# Patient Record
Sex: Female | Born: 1935 | Race: White | Hispanic: No | Smoking: Never smoker
Health system: Southern US, Community
[De-identification: ages and names within clinical notes are randomized; demographics above are authoritative.]

## PROBLEM LIST (undated history)

## (undated) DIAGNOSIS — F329 Major depressive disorder, single episode, unspecified: Secondary | ICD-10-CM

## (undated) DIAGNOSIS — M48061 Spinal stenosis, lumbar region without neurogenic claudication: Secondary | ICD-10-CM

## (undated) DIAGNOSIS — F32A Depression, unspecified: Secondary | ICD-10-CM

## (undated) DIAGNOSIS — R2689 Other abnormalities of gait and mobility: Secondary | ICD-10-CM

## (undated) DIAGNOSIS — M4120 Other idiopathic scoliosis, site unspecified: Secondary | ICD-10-CM

## (undated) DIAGNOSIS — K449 Diaphragmatic hernia without obstruction or gangrene: Secondary | ICD-10-CM

## (undated) DIAGNOSIS — I10 Essential (primary) hypertension: Secondary | ICD-10-CM

## (undated) DIAGNOSIS — I369 Nonrheumatic tricuspid valve disorder, unspecified: Secondary | ICD-10-CM

## (undated) DIAGNOSIS — I2510 Atherosclerotic heart disease of native coronary artery without angina pectoris: Secondary | ICD-10-CM

## (undated) DIAGNOSIS — I493 Ventricular premature depolarization: Secondary | ICD-10-CM

## (undated) DIAGNOSIS — M810 Age-related osteoporosis without current pathological fracture: Secondary | ICD-10-CM

## (undated) DIAGNOSIS — H9190 Unspecified hearing loss, unspecified ear: Secondary | ICD-10-CM

## (undated) DIAGNOSIS — E785 Hyperlipidemia, unspecified: Secondary | ICD-10-CM

## (undated) DIAGNOSIS — D510 Vitamin B12 deficiency anemia due to intrinsic factor deficiency: Secondary | ICD-10-CM

## (undated) DIAGNOSIS — K5790 Diverticulosis of intestine, part unspecified, without perforation or abscess without bleeding: Secondary | ICD-10-CM

## (undated) DIAGNOSIS — M549 Dorsalgia, unspecified: Secondary | ICD-10-CM

## (undated) DIAGNOSIS — M419 Scoliosis, unspecified: Secondary | ICD-10-CM

## (undated) DIAGNOSIS — I350 Nonrheumatic aortic (valve) stenosis: Secondary | ICD-10-CM

## (undated) DIAGNOSIS — I7300 Raynaud's syndrome without gangrene: Secondary | ICD-10-CM

## (undated) DIAGNOSIS — I491 Atrial premature depolarization: Secondary | ICD-10-CM

## (undated) DIAGNOSIS — R739 Hyperglycemia, unspecified: Secondary | ICD-10-CM

## (undated) DIAGNOSIS — I340 Nonrheumatic mitral (valve) insufficiency: Secondary | ICD-10-CM

## (undated) HISTORY — DX: Nonrheumatic tricuspid valve disorder, unspecified: I36.9

## (undated) HISTORY — DX: Spinal stenosis, lumbar region without neurogenic claudication: M48.061

## (undated) HISTORY — DX: Diaphragmatic hernia without obstruction or gangrene: K44.9

## (undated) HISTORY — PX: APPENDECTOMY: SHX54

## (undated) HISTORY — DX: Age-related osteoporosis without current pathological fracture: M81.0

## (undated) HISTORY — DX: Hyperglycemia, unspecified: R73.9

## (undated) HISTORY — PX: TONSILLECTOMY: SHX5217

## (undated) HISTORY — DX: Atherosclerotic heart disease of native coronary artery without angina pectoris: I25.10

## (undated) HISTORY — DX: Other idiopathic scoliosis, site unspecified: M41.20

## (undated) HISTORY — DX: Diverticulosis of intestine, part unspecified, without perforation or abscess without bleeding: K57.90

## (undated) HISTORY — DX: Dorsalgia, unspecified: M54.9

## (undated) HISTORY — PX: FOOT SURGERY: SHX648

## (undated) HISTORY — DX: Nonrheumatic mitral (valve) insufficiency: I34.0

## (undated) HISTORY — DX: Hyperlipidemia, unspecified: E78.5

## (undated) HISTORY — DX: Ventricular premature depolarization: I49.3

## (undated) HISTORY — DX: Nonrheumatic aortic (valve) stenosis: I35.0

## (undated) HISTORY — DX: Unspecified hearing loss, unspecified ear: H91.90

## (undated) HISTORY — DX: Major depressive disorder, single episode, unspecified: F32.9

## (undated) HISTORY — DX: Atrial premature depolarization: I49.1

## (undated) HISTORY — DX: Depression, unspecified: F32.A

## (undated) HISTORY — DX: Raynaud's syndrome without gangrene: I73.00

## (undated) HISTORY — DX: Essential (primary) hypertension: I10

## (undated) HISTORY — DX: Vitamin B12 deficiency anemia due to intrinsic factor deficiency: D51.0

## (undated) HISTORY — PX: BREAST SURGERY: SHX581

## (undated) HISTORY — DX: Scoliosis, unspecified: M41.9

## (undated) HISTORY — DX: Other abnormalities of gait and mobility: R26.89

---

## 2005-11-28 HISTORY — PX: OTHER SURGICAL HISTORY: SHX169

## 2013-06-17 DIAGNOSIS — R5383 Other fatigue: Secondary | ICD-10-CM | POA: Insufficient documentation

## 2013-06-17 DIAGNOSIS — I10 Essential (primary) hypertension: Secondary | ICD-10-CM | POA: Insufficient documentation

## 2014-06-16 DIAGNOSIS — I491 Atrial premature depolarization: Secondary | ICD-10-CM | POA: Insufficient documentation

## 2014-11-28 HISTORY — PX: COLONOSCOPY: SHX174

## 2014-12-02 DIAGNOSIS — E538 Deficiency of other specified B group vitamins: Secondary | ICD-10-CM | POA: Diagnosis not present

## 2014-12-09 DIAGNOSIS — Z23 Encounter for immunization: Secondary | ICD-10-CM | POA: Diagnosis not present

## 2014-12-11 DIAGNOSIS — N200 Calculus of kidney: Secondary | ICD-10-CM | POA: Diagnosis not present

## 2014-12-11 DIAGNOSIS — N3941 Urge incontinence: Secondary | ICD-10-CM | POA: Diagnosis not present

## 2015-01-06 DIAGNOSIS — M47812 Spondylosis without myelopathy or radiculopathy, cervical region: Secondary | ICD-10-CM | POA: Diagnosis not present

## 2015-01-06 DIAGNOSIS — K589 Irritable bowel syndrome without diarrhea: Secondary | ICD-10-CM | POA: Diagnosis not present

## 2015-01-06 DIAGNOSIS — K219 Gastro-esophageal reflux disease without esophagitis: Secondary | ICD-10-CM | POA: Diagnosis not present

## 2015-01-06 DIAGNOSIS — D510 Vitamin B12 deficiency anemia due to intrinsic factor deficiency: Secondary | ICD-10-CM | POA: Diagnosis not present

## 2015-01-06 DIAGNOSIS — R079 Chest pain, unspecified: Secondary | ICD-10-CM | POA: Diagnosis not present

## 2015-01-06 DIAGNOSIS — R51 Headache: Secondary | ICD-10-CM | POA: Diagnosis not present

## 2015-01-06 DIAGNOSIS — M8580 Other specified disorders of bone density and structure, unspecified site: Secondary | ICD-10-CM | POA: Diagnosis not present

## 2015-01-06 DIAGNOSIS — M549 Dorsalgia, unspecified: Secondary | ICD-10-CM | POA: Diagnosis not present

## 2015-01-06 DIAGNOSIS — J209 Acute bronchitis, unspecified: Secondary | ICD-10-CM | POA: Diagnosis not present

## 2015-01-06 DIAGNOSIS — I2510 Atherosclerotic heart disease of native coronary artery without angina pectoris: Secondary | ICD-10-CM | POA: Diagnosis not present

## 2015-01-06 DIAGNOSIS — E780 Pure hypercholesterolemia: Secondary | ICD-10-CM | POA: Diagnosis not present

## 2015-01-06 DIAGNOSIS — F329 Major depressive disorder, single episode, unspecified: Secondary | ICD-10-CM | POA: Diagnosis not present

## 2015-01-06 DIAGNOSIS — Z79899 Other long term (current) drug therapy: Secondary | ICD-10-CM | POA: Diagnosis not present

## 2015-01-06 DIAGNOSIS — M4134 Thoracogenic scoliosis, thoracic region: Secondary | ICD-10-CM | POA: Diagnosis not present

## 2015-01-29 DIAGNOSIS — Z1231 Encounter for screening mammogram for malignant neoplasm of breast: Secondary | ICD-10-CM | POA: Diagnosis not present

## 2015-04-07 DIAGNOSIS — R51 Headache: Secondary | ICD-10-CM | POA: Diagnosis not present

## 2015-04-07 DIAGNOSIS — M549 Dorsalgia, unspecified: Secondary | ICD-10-CM | POA: Diagnosis not present

## 2015-04-07 DIAGNOSIS — E780 Pure hypercholesterolemia: Secondary | ICD-10-CM | POA: Diagnosis not present

## 2015-04-07 DIAGNOSIS — K219 Gastro-esophageal reflux disease without esophagitis: Secondary | ICD-10-CM | POA: Diagnosis not present

## 2015-04-07 DIAGNOSIS — Z79899 Other long term (current) drug therapy: Secondary | ICD-10-CM | POA: Diagnosis not present

## 2015-04-07 DIAGNOSIS — I10 Essential (primary) hypertension: Secondary | ICD-10-CM | POA: Diagnosis not present

## 2015-04-07 DIAGNOSIS — M8580 Other specified disorders of bone density and structure, unspecified site: Secondary | ICD-10-CM | POA: Diagnosis not present

## 2015-04-07 DIAGNOSIS — I2510 Atherosclerotic heart disease of native coronary artery without angina pectoris: Secondary | ICD-10-CM | POA: Diagnosis not present

## 2015-04-07 DIAGNOSIS — K589 Irritable bowel syndrome without diarrhea: Secondary | ICD-10-CM | POA: Diagnosis not present

## 2015-04-07 DIAGNOSIS — F329 Major depressive disorder, single episode, unspecified: Secondary | ICD-10-CM | POA: Diagnosis not present

## 2015-04-07 DIAGNOSIS — R079 Chest pain, unspecified: Secondary | ICD-10-CM | POA: Diagnosis not present

## 2015-04-07 DIAGNOSIS — J209 Acute bronchitis, unspecified: Secondary | ICD-10-CM | POA: Diagnosis not present

## 2015-04-07 DIAGNOSIS — M47812 Spondylosis without myelopathy or radiculopathy, cervical region: Secondary | ICD-10-CM | POA: Diagnosis not present

## 2015-04-07 DIAGNOSIS — M4134 Thoracogenic scoliosis, thoracic region: Secondary | ICD-10-CM | POA: Diagnosis not present

## 2015-04-09 DIAGNOSIS — X32XXXS Exposure to sunlight, sequela: Secondary | ICD-10-CM | POA: Diagnosis not present

## 2015-04-09 DIAGNOSIS — Z1283 Encounter for screening for malignant neoplasm of skin: Secondary | ICD-10-CM | POA: Diagnosis not present

## 2015-04-09 DIAGNOSIS — D225 Melanocytic nevi of trunk: Secondary | ICD-10-CM | POA: Diagnosis not present

## 2015-04-09 DIAGNOSIS — L821 Other seborrheic keratosis: Secondary | ICD-10-CM | POA: Diagnosis not present

## 2015-04-09 DIAGNOSIS — L570 Actinic keratosis: Secondary | ICD-10-CM | POA: Diagnosis not present

## 2015-04-09 DIAGNOSIS — D1801 Hemangioma of skin and subcutaneous tissue: Secondary | ICD-10-CM | POA: Diagnosis not present

## 2015-05-14 DIAGNOSIS — D510 Vitamin B12 deficiency anemia due to intrinsic factor deficiency: Secondary | ICD-10-CM | POA: Diagnosis not present

## 2015-05-29 DIAGNOSIS — H40003 Preglaucoma, unspecified, bilateral: Secondary | ICD-10-CM | POA: Diagnosis not present

## 2015-05-29 DIAGNOSIS — H25813 Combined forms of age-related cataract, bilateral: Secondary | ICD-10-CM | POA: Diagnosis not present

## 2015-06-02 DIAGNOSIS — H169 Unspecified keratitis: Secondary | ICD-10-CM | POA: Diagnosis not present

## 2015-06-08 DIAGNOSIS — I2510 Atherosclerotic heart disease of native coronary artery without angina pectoris: Secondary | ICD-10-CM | POA: Diagnosis not present

## 2015-06-08 DIAGNOSIS — I2583 Coronary atherosclerosis due to lipid rich plaque: Secondary | ICD-10-CM | POA: Diagnosis not present

## 2015-06-10 DIAGNOSIS — N3941 Urge incontinence: Secondary | ICD-10-CM | POA: Diagnosis not present

## 2015-06-10 DIAGNOSIS — I2510 Atherosclerotic heart disease of native coronary artery without angina pectoris: Secondary | ICD-10-CM | POA: Diagnosis not present

## 2015-06-10 DIAGNOSIS — I2583 Coronary atherosclerosis due to lipid rich plaque: Secondary | ICD-10-CM | POA: Diagnosis not present

## 2015-06-16 DIAGNOSIS — H169 Unspecified keratitis: Secondary | ICD-10-CM | POA: Diagnosis not present

## 2015-07-10 DIAGNOSIS — E538 Deficiency of other specified B group vitamins: Secondary | ICD-10-CM | POA: Diagnosis not present

## 2015-08-05 DIAGNOSIS — M549 Dorsalgia, unspecified: Secondary | ICD-10-CM | POA: Diagnosis not present

## 2015-08-05 DIAGNOSIS — E780 Pure hypercholesterolemia: Secondary | ICD-10-CM | POA: Diagnosis not present

## 2015-08-05 DIAGNOSIS — I10 Essential (primary) hypertension: Secondary | ICD-10-CM | POA: Diagnosis not present

## 2015-08-05 DIAGNOSIS — Z79899 Other long term (current) drug therapy: Secondary | ICD-10-CM | POA: Diagnosis not present

## 2015-08-12 DIAGNOSIS — D510 Vitamin B12 deficiency anemia due to intrinsic factor deficiency: Secondary | ICD-10-CM | POA: Diagnosis not present

## 2015-08-12 DIAGNOSIS — Z79899 Other long term (current) drug therapy: Secondary | ICD-10-CM | POA: Diagnosis not present

## 2015-08-12 DIAGNOSIS — Z0000 Encounter for general adult medical examination without abnormal findings: Secondary | ICD-10-CM | POA: Diagnosis not present

## 2015-08-12 DIAGNOSIS — Z124 Encounter for screening for malignant neoplasm of cervix: Secondary | ICD-10-CM | POA: Diagnosis not present

## 2015-08-21 DIAGNOSIS — M549 Dorsalgia, unspecified: Secondary | ICD-10-CM | POA: Diagnosis not present

## 2015-08-21 DIAGNOSIS — N399 Disorder of urinary system, unspecified: Secondary | ICD-10-CM | POA: Diagnosis not present

## 2015-09-02 DIAGNOSIS — N3941 Urge incontinence: Secondary | ICD-10-CM | POA: Diagnosis not present

## 2015-09-02 DIAGNOSIS — I2583 Coronary atherosclerosis due to lipid rich plaque: Secondary | ICD-10-CM | POA: Diagnosis not present

## 2015-09-02 DIAGNOSIS — I2510 Atherosclerotic heart disease of native coronary artery without angina pectoris: Secondary | ICD-10-CM | POA: Diagnosis not present

## 2015-09-15 DIAGNOSIS — N390 Urinary tract infection, site not specified: Secondary | ICD-10-CM | POA: Diagnosis not present

## 2015-09-16 DIAGNOSIS — N390 Urinary tract infection, site not specified: Secondary | ICD-10-CM | POA: Diagnosis not present

## 2015-09-24 DIAGNOSIS — H40003 Preglaucoma, unspecified, bilateral: Secondary | ICD-10-CM | POA: Diagnosis not present

## 2015-09-24 DIAGNOSIS — H169 Unspecified keratitis: Secondary | ICD-10-CM | POA: Diagnosis not present

## 2015-09-24 DIAGNOSIS — H2513 Age-related nuclear cataract, bilateral: Secondary | ICD-10-CM | POA: Diagnosis not present

## 2015-09-24 DIAGNOSIS — H04123 Dry eye syndrome of bilateral lacrimal glands: Secondary | ICD-10-CM | POA: Diagnosis not present

## 2015-09-24 DIAGNOSIS — H527 Unspecified disorder of refraction: Secondary | ICD-10-CM | POA: Diagnosis not present

## 2015-09-28 DIAGNOSIS — H40003 Preglaucoma, unspecified, bilateral: Secondary | ICD-10-CM | POA: Diagnosis not present

## 2015-09-28 DIAGNOSIS — H2513 Age-related nuclear cataract, bilateral: Secondary | ICD-10-CM | POA: Diagnosis not present

## 2015-09-28 DIAGNOSIS — H169 Unspecified keratitis: Secondary | ICD-10-CM | POA: Diagnosis not present

## 2015-10-01 DIAGNOSIS — N390 Urinary tract infection, site not specified: Secondary | ICD-10-CM | POA: Diagnosis not present

## 2015-10-01 DIAGNOSIS — A499 Bacterial infection, unspecified: Secondary | ICD-10-CM | POA: Diagnosis not present

## 2015-10-15 DIAGNOSIS — N390 Urinary tract infection, site not specified: Secondary | ICD-10-CM | POA: Diagnosis not present

## 2015-10-16 DIAGNOSIS — N390 Urinary tract infection, site not specified: Secondary | ICD-10-CM | POA: Diagnosis not present

## 2015-10-20 DIAGNOSIS — D510 Vitamin B12 deficiency anemia due to intrinsic factor deficiency: Secondary | ICD-10-CM | POA: Diagnosis not present

## 2015-11-05 DIAGNOSIS — A499 Bacterial infection, unspecified: Secondary | ICD-10-CM | POA: Diagnosis not present

## 2015-11-05 DIAGNOSIS — N390 Urinary tract infection, site not specified: Secondary | ICD-10-CM | POA: Diagnosis not present

## 2015-11-11 DIAGNOSIS — I2510 Atherosclerotic heart disease of native coronary artery without angina pectoris: Secondary | ICD-10-CM | POA: Diagnosis not present

## 2015-11-11 DIAGNOSIS — I2583 Coronary atherosclerosis due to lipid rich plaque: Secondary | ICD-10-CM | POA: Diagnosis not present

## 2015-11-11 DIAGNOSIS — M5416 Radiculopathy, lumbar region: Secondary | ICD-10-CM | POA: Diagnosis not present

## 2015-11-29 HISTORY — PX: CATARACT EXTRACTION W/ INTRAOCULAR LENS  IMPLANT, BILATERAL: SHX1307

## 2015-12-01 DIAGNOSIS — M5442 Lumbago with sciatica, left side: Secondary | ICD-10-CM | POA: Diagnosis not present

## 2015-12-01 DIAGNOSIS — M5136 Other intervertebral disc degeneration, lumbar region: Secondary | ICD-10-CM | POA: Diagnosis not present

## 2015-12-01 DIAGNOSIS — M4125 Other idiopathic scoliosis, thoracolumbar region: Secondary | ICD-10-CM | POA: Diagnosis not present

## 2015-12-01 DIAGNOSIS — M5432 Sciatica, left side: Secondary | ICD-10-CM | POA: Diagnosis not present

## 2015-12-02 DIAGNOSIS — D510 Vitamin B12 deficiency anemia due to intrinsic factor deficiency: Secondary | ICD-10-CM | POA: Diagnosis not present

## 2015-12-03 DIAGNOSIS — I2510 Atherosclerotic heart disease of native coronary artery without angina pectoris: Secondary | ICD-10-CM | POA: Diagnosis not present

## 2015-12-03 DIAGNOSIS — M5417 Radiculopathy, lumbosacral region: Secondary | ICD-10-CM | POA: Diagnosis not present

## 2015-12-03 DIAGNOSIS — Z885 Allergy status to narcotic agent status: Secondary | ICD-10-CM | POA: Diagnosis not present

## 2015-12-03 DIAGNOSIS — Z955 Presence of coronary angioplasty implant and graft: Secondary | ICD-10-CM | POA: Diagnosis not present

## 2015-12-03 DIAGNOSIS — Z882 Allergy status to sulfonamides status: Secondary | ICD-10-CM | POA: Diagnosis not present

## 2015-12-03 DIAGNOSIS — E785 Hyperlipidemia, unspecified: Secondary | ICD-10-CM | POA: Diagnosis not present

## 2015-12-03 DIAGNOSIS — M1990 Unspecified osteoarthritis, unspecified site: Secondary | ICD-10-CM | POA: Diagnosis not present

## 2015-12-03 DIAGNOSIS — I10 Essential (primary) hypertension: Secondary | ICD-10-CM | POA: Diagnosis not present

## 2015-12-03 DIAGNOSIS — Z888 Allergy status to other drugs, medicaments and biological substances status: Secondary | ICD-10-CM | POA: Diagnosis not present

## 2015-12-04 DIAGNOSIS — Z01818 Encounter for other preprocedural examination: Secondary | ICD-10-CM | POA: Diagnosis not present

## 2015-12-04 DIAGNOSIS — R009 Unspecified abnormalities of heart beat: Secondary | ICD-10-CM | POA: Diagnosis not present

## 2015-12-16 DIAGNOSIS — M549 Dorsalgia, unspecified: Secondary | ICD-10-CM | POA: Diagnosis not present

## 2015-12-16 DIAGNOSIS — M8580 Other specified disorders of bone density and structure, unspecified site: Secondary | ICD-10-CM | POA: Diagnosis not present

## 2015-12-16 DIAGNOSIS — D510 Vitamin B12 deficiency anemia due to intrinsic factor deficiency: Secondary | ICD-10-CM | POA: Diagnosis not present

## 2015-12-16 DIAGNOSIS — E7800 Pure hypercholesterolemia, unspecified: Secondary | ICD-10-CM | POA: Diagnosis not present

## 2015-12-16 DIAGNOSIS — F329 Major depressive disorder, single episode, unspecified: Secondary | ICD-10-CM | POA: Diagnosis not present

## 2015-12-16 DIAGNOSIS — I2510 Atherosclerotic heart disease of native coronary artery without angina pectoris: Secondary | ICD-10-CM | POA: Diagnosis not present

## 2015-12-16 DIAGNOSIS — R51 Headache: Secondary | ICD-10-CM | POA: Diagnosis not present

## 2016-01-06 DIAGNOSIS — R221 Localized swelling, mass and lump, neck: Secondary | ICD-10-CM | POA: Diagnosis not present

## 2016-01-08 DIAGNOSIS — I2583 Coronary atherosclerosis due to lipid rich plaque: Secondary | ICD-10-CM | POA: Diagnosis not present

## 2016-01-08 DIAGNOSIS — I2510 Atherosclerotic heart disease of native coronary artery without angina pectoris: Secondary | ICD-10-CM | POA: Diagnosis not present

## 2016-01-08 DIAGNOSIS — I493 Ventricular premature depolarization: Secondary | ICD-10-CM | POA: Diagnosis not present

## 2016-01-08 DIAGNOSIS — I491 Atrial premature depolarization: Secondary | ICD-10-CM | POA: Diagnosis not present

## 2016-01-08 DIAGNOSIS — R002 Palpitations: Secondary | ICD-10-CM | POA: Diagnosis not present

## 2016-01-11 DIAGNOSIS — D510 Vitamin B12 deficiency anemia due to intrinsic factor deficiency: Secondary | ICD-10-CM | POA: Diagnosis not present

## 2016-01-19 DIAGNOSIS — M25552 Pain in left hip: Secondary | ICD-10-CM | POA: Diagnosis not present

## 2016-01-19 DIAGNOSIS — M7062 Trochanteric bursitis, left hip: Secondary | ICD-10-CM | POA: Diagnosis not present

## 2016-01-26 DIAGNOSIS — L723 Sebaceous cyst: Secondary | ICD-10-CM | POA: Diagnosis not present

## 2016-01-26 DIAGNOSIS — L089 Local infection of the skin and subcutaneous tissue, unspecified: Secondary | ICD-10-CM | POA: Diagnosis not present

## 2016-01-26 DIAGNOSIS — N6089 Other benign mammary dysplasias of unspecified breast: Secondary | ICD-10-CM | POA: Diagnosis not present

## 2016-02-04 DIAGNOSIS — Z1231 Encounter for screening mammogram for malignant neoplasm of breast: Secondary | ICD-10-CM | POA: Diagnosis not present

## 2016-02-23 DIAGNOSIS — D510 Vitamin B12 deficiency anemia due to intrinsic factor deficiency: Secondary | ICD-10-CM | POA: Diagnosis not present

## 2016-02-29 DIAGNOSIS — N3001 Acute cystitis with hematuria: Secondary | ICD-10-CM | POA: Diagnosis not present

## 2016-03-01 DIAGNOSIS — N3001 Acute cystitis with hematuria: Secondary | ICD-10-CM | POA: Diagnosis not present

## 2016-03-08 DIAGNOSIS — R300 Dysuria: Secondary | ICD-10-CM | POA: Diagnosis not present

## 2016-03-08 DIAGNOSIS — R531 Weakness: Secondary | ICD-10-CM | POA: Diagnosis not present

## 2016-04-12 ENCOUNTER — Encounter: Payer: Self-pay | Admitting: Internal Medicine

## 2016-04-12 ENCOUNTER — Non-Acute Institutional Stay: Payer: Medicare Other | Admitting: Internal Medicine

## 2016-04-12 VITALS — BP 120/76 | HR 58 | Temp 97.8°F | Ht 64.5 in | Wt 120.0 lb

## 2016-04-12 DIAGNOSIS — M4806 Spinal stenosis, lumbar region: Secondary | ICD-10-CM

## 2016-04-12 DIAGNOSIS — I340 Nonrheumatic mitral (valve) insufficiency: Secondary | ICD-10-CM | POA: Diagnosis not present

## 2016-04-12 DIAGNOSIS — I2510 Atherosclerotic heart disease of native coronary artery without angina pectoris: Secondary | ICD-10-CM | POA: Diagnosis not present

## 2016-04-12 DIAGNOSIS — I350 Nonrheumatic aortic (valve) stenosis: Secondary | ICD-10-CM

## 2016-04-12 DIAGNOSIS — M4120 Other idiopathic scoliosis, site unspecified: Secondary | ICD-10-CM | POA: Insufficient documentation

## 2016-04-12 DIAGNOSIS — F3340 Major depressive disorder, recurrent, in remission, unspecified: Secondary | ICD-10-CM | POA: Diagnosis not present

## 2016-04-12 DIAGNOSIS — K449 Diaphragmatic hernia without obstruction or gangrene: Secondary | ICD-10-CM

## 2016-04-12 DIAGNOSIS — H9193 Unspecified hearing loss, bilateral: Secondary | ICD-10-CM | POA: Diagnosis not present

## 2016-04-12 DIAGNOSIS — M5441 Lumbago with sciatica, right side: Secondary | ICD-10-CM | POA: Diagnosis not present

## 2016-04-12 DIAGNOSIS — D519 Vitamin B12 deficiency anemia, unspecified: Secondary | ICD-10-CM | POA: Insufficient documentation

## 2016-04-12 DIAGNOSIS — I7300 Raynaud's syndrome without gangrene: Secondary | ICD-10-CM

## 2016-04-12 DIAGNOSIS — H409 Unspecified glaucoma: Secondary | ICD-10-CM

## 2016-04-12 DIAGNOSIS — F329 Major depressive disorder, single episode, unspecified: Secondary | ICD-10-CM

## 2016-04-12 DIAGNOSIS — H9190 Unspecified hearing loss, unspecified ear: Secondary | ICD-10-CM

## 2016-04-12 DIAGNOSIS — E785 Hyperlipidemia, unspecified: Secondary | ICD-10-CM

## 2016-04-12 DIAGNOSIS — R29818 Other symptoms and signs involving the nervous system: Secondary | ICD-10-CM

## 2016-04-12 DIAGNOSIS — R2689 Other abnormalities of gait and mobility: Secondary | ICD-10-CM | POA: Insufficient documentation

## 2016-04-12 DIAGNOSIS — M48061 Spinal stenosis, lumbar region without neurogenic claudication: Secondary | ICD-10-CM | POA: Insufficient documentation

## 2016-04-12 DIAGNOSIS — R002 Palpitations: Secondary | ICD-10-CM | POA: Insufficient documentation

## 2016-04-12 DIAGNOSIS — D510 Vitamin B12 deficiency anemia due to intrinsic factor deficiency: Secondary | ICD-10-CM | POA: Diagnosis not present

## 2016-04-12 DIAGNOSIS — I10 Essential (primary) hypertension: Secondary | ICD-10-CM

## 2016-04-12 DIAGNOSIS — D649 Anemia, unspecified: Secondary | ICD-10-CM | POA: Insufficient documentation

## 2016-04-12 DIAGNOSIS — M549 Dorsalgia, unspecified: Secondary | ICD-10-CM | POA: Insufficient documentation

## 2016-04-12 DIAGNOSIS — M810 Age-related osteoporosis without current pathological fracture: Secondary | ICD-10-CM

## 2016-04-12 HISTORY — DX: Diaphragmatic hernia without obstruction or gangrene: K44.9

## 2016-04-12 HISTORY — DX: Spinal stenosis, lumbar region without neurogenic claudication: M48.061

## 2016-04-12 HISTORY — DX: Hyperlipidemia, unspecified: E78.5

## 2016-04-12 HISTORY — DX: Unspecified hearing loss, unspecified ear: H91.90

## 2016-04-12 HISTORY — DX: Major depressive disorder, single episode, unspecified: F32.9

## 2016-04-12 HISTORY — DX: Raynaud's syndrome without gangrene: I73.00

## 2016-04-12 HISTORY — DX: Nonrheumatic aortic (valve) stenosis: I35.0

## 2016-04-12 HISTORY — DX: Age-related osteoporosis without current pathological fracture: M81.0

## 2016-04-12 HISTORY — DX: Other idiopathic scoliosis, site unspecified: M41.20
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
## 2016-04-20 ENCOUNTER — Other Ambulatory Visit: Payer: Self-pay | Admitting: *Deleted

## 2016-04-20 MED ORDER — ESOMEPRAZOLE MAGNESIUM 40 MG PO CPDR: DELAYED_RELEASE_CAPSULE | ORAL | Status: DC

## 2016-04-20 NOTE — Telephone Encounter (Signed)
Patient requested refill to be faxed to pharmacy.  

## 2016-05-26 ENCOUNTER — Telehealth: Payer: Self-pay | Admitting: *Deleted

## 2016-05-26 DIAGNOSIS — M25552 Pain in left hip: Secondary | ICD-10-CM

## 2016-05-26 NOTE — Telephone Encounter (Signed)
Patient called and stated that she is a new patient at [facility] and just saw you. She stated that she has started having some Left hip pain, stated that this is not new. Before she moved she saw an Orthopedic Provider that gave her shots for this. She stated she would like a referral to an Orthopedic Dr. Here for the Left hip pain. Please Advise.

## 2016-05-26 NOTE — Telephone Encounter (Signed)
Patient notified and agreed. Referral placed. 

## 2016-05-26 NOTE — Telephone Encounter (Signed)
I would recommend Dr. Carter Kitten. If this is acceptable to her, please make a referral.

## 2016-06-03 DIAGNOSIS — M7062 Trochanteric bursitis, left hip: Secondary | ICD-10-CM | POA: Diagnosis not present

## 2016-06-20 DIAGNOSIS — M7062 Trochanteric bursitis, left hip: Secondary | ICD-10-CM | POA: Diagnosis not present

## 2016-06-20 DIAGNOSIS — M545 Low back pain: Secondary | ICD-10-CM | POA: Diagnosis not present

## 2016-06-21 ENCOUNTER — Ambulatory Visit (INDEPENDENT_AMBULATORY_CARE_PROVIDER_SITE_OTHER): Payer: Medicare Other | Admitting: Nurse Practitioner

## 2016-06-21 ENCOUNTER — Encounter: Payer: Self-pay | Admitting: Nurse Practitioner

## 2016-06-21 VITALS — BP 118/68 | HR 54 | Temp 97.9°F | Wt 119.2 lb

## 2016-06-21 DIAGNOSIS — R42 Dizziness and giddiness: Secondary | ICD-10-CM

## 2016-06-21 DIAGNOSIS — I2510 Atherosclerotic heart disease of native coronary artery without angina pectoris: Secondary | ICD-10-CM | POA: Diagnosis not present

## 2016-06-21 DIAGNOSIS — D510 Vitamin B12 deficiency anemia due to intrinsic factor deficiency: Secondary | ICD-10-CM

## 2016-06-21 NOTE — Progress Notes (Signed)
PCP: Jeanmarie Hubert, MD  Advanced Directive information Does patient have an advance directive?: Yes, Type of Advance Directive: [place];Living will, Does patient want to make changes to advanced directive?: No - Patient declined  Allergies  Allergen Reactions  . Sulfa Antibiotics Other (See Comments)    headaches  . Topiramate Rash  . Verapamil Other (See Comments)    constipation  . Zolpidem Other (See Comments)    hallucinations  . Prevacid [Lansoprazole]     Chest pain  . Ace Inhibitors Cough  . Codeine Nausea Only  . Ibandronic Acid Nausea Only    Chief Complaint  Patient presents with  . Referral    Cardiologist  . Fatigue    Patient states shes been feeling weak and dizzy and clammy started this week     HPI: Patient is a 82 y.o. female seen in the office today requesting cardiology referral. Pt moved to [facility] in March. Needing cardiologist due to heart history. Pt with hx of CAD, angina and stent placement in 2008, htn, hyperlipdiema, MR. Previous cardiologist Dr Junius Roads part of [facility] in [place]. Would like referral today.  2 weeks ago after she went to bed had an episode that she felt like could have been chest pains, took nitro which helped symptoms.   Saw Dr Mardelle Matte due to pain in her hip. She has had 2 injections due to bursitis. Still having some pain today.   Had a weak spell and some dizziness yesterday and this morning.  Also got clammy. No chest pains. Lasted a few mins and then it resolved. Felt fine afterwards. Was able to go out to lunch.  No blurred vision, no weakness on one side, no nausea or vomiting.  No loss of balance or loss of consciousness.  Review of Systems:  Review of Systems  Constitutional: Negative for activity change, appetite change, chills, diaphoresis, fatigue, fever and unexpected weight change.  HENT: Positive for hearing loss (bilateral aides). Negative for congestion, ear discharge, ear pain, postnasal  drip, rhinorrhea and sore throat.   Eyes: Negative for pain, redness, itching and visual disturbance.  Respiratory: Negative for cough and shortness of breath.   Cardiovascular: Negative for chest pain, palpitations and leg swelling.       CAD with stent 2008  Gastrointestinal: Negative for abdominal distention, abdominal pain, constipation, diarrhea and nausea.  Endocrine: Negative for cold intolerance, heat intolerance, polydipsia, polyphagia and polyuria.  Genitourinary: Negative for difficulty urinating, dysuria, flank pain and frequency.       UTI a couple of times in the past.  Musculoskeletal: Positive for gait problem (loss of balance). Negative for arthralgias, back pain, myalgias, neck pain and neck stiffness.       Scoliosis. Right sciatica from spinal stenosis. Receives epidural injections.  Skin:       Reports petechia  Allergic/Immunologic: Negative.   Neurological: Positive for dizziness, weakness and light-headedness. Negative for tremors, seizures, syncope, speech difficulty, numbness and headaches.       Patient reports balance issues.  Hematological: Negative for adenopathy. Does not bruise/bleed easily.       Pernicious anemia. Gets monthly B12 injection.  Psychiatric/Behavioral: Negative for agitation, behavioral problems, confusion, dysphoric mood, hallucinations, sleep disturbance and suicidal ideas. The patient is not nervous/anxious and is not hyperactive.        Hx of depression. In remission on Cymbalta.    Past Medical History:  Diagnosis Date  . Anemia 04/12/2016  . Aortic stenosis 04/12/2016  .  APC (atrial premature contractions) 06/16/2014  . Back pain 04/12/2016  . Balance disorder 04/12/2016  . Beat, premature ventricular 01/08/2016  . BP (high blood pressure) 06/17/2013  . Coronary artery disease   . Depression   . Diverticulosis   . Fatigue   . H/O heart artery stent   . Hearing loss 04/12/2016  . Hiatal hernia 04/12/2016  . HLD (hyperlipidemia)  04/12/2016  . Hypertension   . Idiopathic scoliosis 04/12/2016  . Major depression ([place]) 04/12/2016  . Mitral regurgitation   . Osteoporosis, senile 04/12/2016  . Palpitations 04/12/2016  . Pernicious anemia   . Premature atrial contraction   . Raynaud's disease 04/12/2016  . Scoliosis   . Spinal stenosis   . Spinal stenosis of lumbar region 04/12/2016  . Tricuspid valve prolapse    Past Surgical History:  Procedure Laterality Date  . APPENDECTOMY    . broken wrist Right 2007  . COLONOSCOPY  2016  . FOOT SURGERY Right   . TONSILLECTOMY     Social History:   reports that she has never smoked. She has never used smokeless tobacco. She reports that she drinks alcohol. She reports that she does not use drugs.  Family History  Problem Relation Age of Onset  . Heart disease Mother   . Heart disease Father   . HIV Daughter 29    from bone tissue transplant    Medications: Patient's Medications  New Prescriptions   No medications on file  Previous Medications   ACETAMINOPHEN (TYLENOL) 500 MG TABLET    Take 650 mg by mouth. Take 2 tablets daily   ASPIRIN 81 MG CHEWABLE TABLET    Chew 81 mg by mouth. Take one daily   CELECOXIB (CELEBREX) 200 MG CAPSULE    Take by mouth. Take one daily   CHOLECALCIFEROL (VITAMIN D3) 2000 UNITS CAPSULE    Take by mouth. Take one daily   CLONAZEPAM (KLONOPIN) 0.5 MG TABLET    Take by mouth. Take one tablet daily   DULOXETINE (CYMBALTA) 60 MG CAPSULE    Take by mouth. Take one tablet daily   ESOMEPRAZOLE (NEXIUM) 40 MG CAPSULE    Take one tablet by mouth once daily for stomach   IPRATROPIUM (ATROVENT) 0.06 % NASAL SPRAY    Place into the nose. One spray nasal every morning as needed   LACTOBACILLUS-INULIN ([place] PO)    Take by mouth. Take one daily for probiotic   METHYLCELLULOSE, LAXATIVE, 500 MG TABS    Take by mouth. Take three tablets daily   METOPROLOL SUCCINATE (TOPROL-XL) 25 MG 24 HR TABLET    Take by mouth. Take one and a  half tablets (37.5 mg) daily for blood pressure   MULTIPLE VITAMINS-MINERALS PO    Take by mouth. Take 1/2 tablet daily   NITROGLYCERIN (NITROSTAT) 0.4 MG SL TABLET    Place under the tongue. Place one tablet under the tongue every 5 minutes as needed for chest pain. No more than 3   RIBOFLAVIN-MAGNESIUM-FEVERFEW 200-180-50 MG TABS    Take by mouth. Take one tablet every evening   SOLIFENACIN (VESICARE) 5 MG TABLET    Take by mouth. Take one tablet daily for blood pressure   TRAMADOL (ULTRAM) 50 MG TABLET    Take by mouth. Take one every 6 hours as needed for pain  Modified Medications   No medications on file  Discontinued Medications   No medications on file     Physical Exam:  Vitals:  06/21/16 1456  BP: 118/68  Pulse: (!) 54  Temp: 97.9 F (36.6 C)  TempSrc: Oral  SpO2: 97%  Weight: 119 lb 3.2 oz (54.1 kg)   Body mass index is 20.14 kg/m.  Physical Exam  Constitutional: She is oriented to person, place, and time. She appears well-developed and well-nourished. No distress.  HENT:  Nose: Nose normal.  Mouth/Throat: Oropharynx is clear and moist. No oropharyngeal exudate.  Bilateral hearing loss  Eyes: Conjunctivae and EOM are normal. Pupils are equal, round, and reactive to light.  Cardiovascular: Normal rate, regular rhythm and normal heart sounds.   Pulmonary/Chest: Effort normal and breath sounds normal.  Abdominal: Soft. Bowel sounds are normal.  Musculoskeletal: Normal range of motion. She exhibits no edema or tenderness.  Moderately severe scoliosis with greater thoracic curve to the right.  Neurological: She is alert and oriented to person, place, and time.  Poor balance. Unable to perform tandem gait.  Skin: She is not diaphoretic.  Redness discoloration to the tips of the fingers and of the toes and feet. Suspect this is related to her Raynaud's syndrome.  Psychiatric: She has a normal mood and affect.    Labs reviewed: Basic Metabolic Panel: No results  for input(s): NA, K, CL, CO2, GLUCOSE, BUN, CREATININE, CALCIUM, MG, PHOS, TSH in the last 8760 hours. Liver Function Tests: No results for input(s): AST, ALT, ALKPHOS, BILITOT, PROT, ALBUMIN in the last 8760 hours. No results for input(s): LIPASE, AMYLASE in the last 8760 hours. No results for input(s): AMMONIA in the last 8760 hours. CBC: No results for input(s): WBC, NEUTROABS, HGB, HCT, MCV, PLT in the last 8760 hours. Lipid Panel: No results for input(s): CHOL, HDL, LDLCALC, TRIG, CHOLHDL, LDLDIRECT in the last 8760 hours. TSH: No results for input(s): TSH in the last 8760 hours. A1C: No results found for: HGBA1C   Assessment/Plan 1. Arteriosclerosis of coronary artery -hx of CAD, stent placement, MR and htn -new in [place] and would like to establish care with cardiologist for routine follow up - Ambulatory referral to Cardiology  2. Pernicious anemia -routine b12 injections, will follow up CBC - CBC with Differential/Platelet  3. Dizziness Lightheaded with dizziness lasting short amount of time and then resolving. Will get lab work today -encouraged hydration -EKG, sent to cardiology for review, do not have previous EKG for comparison  - CBC with Differential/Platelet - COMPLETE METABOLIC PANEL WITH GFR   Valla Pacey K. Harle Battiest  [facility] [phone] 8 am - 5 pm) [phone] (after hours)

## 2016-06-22 LAB — CBC WITH DIFFERENTIAL/PLATELET
BASOS ABS: 0 {cells}/uL (ref 0–200)
Basophils Relative: 0 %
EOS ABS: 102 {cells}/uL (ref 15–500)
EOS PCT: 1 %
HCT: 37.5 % (ref 35.0–45.0)
HEMOGLOBIN: 12.5 g/dL (ref 11.7–15.5)
LYMPHS ABS: 1428 {cells}/uL (ref 850–3900)
Lymphocytes Relative: 14 %
MCH: 35.2 pg — AB (ref 27.0–33.0)
MCHC: 33.3 g/dL (ref 32.0–36.0)
MCV: 105.6 fL — AB (ref 80.0–100.0)
MONOS PCT: 6 %
MPV: 9.5 fL (ref 7.5–12.5)
Monocytes Absolute: 612 cells/uL (ref 200–950)
NEUTROS PCT: 79 %
Neutro Abs: 8058 cells/uL — ABNORMAL HIGH (ref 1500–7800)
Platelets: 313 10*3/uL (ref 140–400)
RBC: 3.55 MIL/uL — ABNORMAL LOW (ref 3.80–5.10)
RDW: 13.4 % (ref 11.0–15.0)
WBC: 10.2 10*3/uL (ref 3.8–10.8)

## 2016-06-22 LAB — COMPLETE METABOLIC PANEL WITH GFR
ALBUMIN: 4.4 g/dL (ref 3.6–5.1)
ALK PHOS: 111 U/L (ref 33–130)
ALT: 16 U/L (ref 6–29)
AST: 23 U/L (ref 10–35)
BILIRUBIN TOTAL: 0.4 mg/dL (ref 0.2–1.2)
BUN: 26 mg/dL — ABNORMAL HIGH (ref 7–25)
CO2: 25 mmol/L (ref 20–31)
Calcium: 9.7 mg/dL (ref 8.6–10.4)
Chloride: 97 mmol/L — ABNORMAL LOW (ref 98–110)
Creat: 1.16 mg/dL — ABNORMAL HIGH (ref 0.60–0.88)
GFR, EST AFRICAN AMERICAN: 51 mL/min — AB (ref >=60)
GFR, EST NON AFRICAN AMERICAN: 44 mL/min — AB (ref >=60)
GLUCOSE: 143 mg/dL — AB (ref 65–99)
POTASSIUM: 4.7 mmol/L (ref 3.5–5.3)
SODIUM: 133 mmol/L — AB (ref 135–146)
TOTAL PROTEIN: 6.9 g/dL (ref 6.1–8.1)

## 2016-06-24 ENCOUNTER — Encounter: Payer: Self-pay | Admitting: Cardiology

## 2016-06-26 ENCOUNTER — Encounter: Payer: Self-pay | Admitting: Cardiology

## 2016-06-26 NOTE — Progress Notes (Signed)
Cardiology Office Note   Date:  06/27/2016   ID:  Heather Olsen, Heather Olsen [birth year], MRN [id number]  PCP:  Heather Hubert, MD  Cardiologist:   Heather Breeding, MD   Chief Complaint  Patient presents with  . Coronary Artery Disease      History of Present Illness: Heather Olsen is a 82 y.o. female who presents for follow up of CAD. The patient is new to me.  She had stent placement in 2008 and was followed by Heather Olsen.  I was able to get these records. She had an LAD stent placed in thousand and 8 which was a 3.8 mm Vision. She had follow-up creatinine 2009 with 30%  narrowing of the stent but otherwise no disease. Stress perfusion study in 2014 was negative. She does have some tricuspid regurgitation and some mild mitral regurgitation and I don't have the actual echo or echo report. However, these were said to be mild and followed clinically. She is now moved to this area.  She lives at a retirement community. She is a widow. She was taking care of her husband who had blindness and deafness and this caused a lot of stress. However, since he died about a year and half ago she's not had any chest discomfort that she was having.  She is not particularly active because she's having some bursitis but she walks to the dining hall once or twice a day. The patient denies any new symptoms such as chest discomfort, neck or arm discomfort. There has been no new shortness of breath, PND or orthopnea. There have been no reported palpitations, presyncope or syncope.     Past Medical History:  Diagnosis Date  . Aortic stenosis 04/12/2016  . Back pain 04/12/2016  . Balance disorder 04/12/2016  . Beat, premature ventricular 01/08/2016  . Coronary artery disease   . Depression   . Diverticulosis   . Hearing loss 04/12/2016  . Hiatal hernia 04/12/2016  . HLD (hyperlipidemia) 04/12/2016  . Hypertension   . Idiopathic scoliosis 04/12/2016  . Major depression ([place]) 04/12/2016  . Mitral regurgitation   .  Osteoporosis, senile 04/12/2016  . Pernicious anemia   . Premature atrial contraction   . Raynaud's disease 04/12/2016  . Scoliosis   . Spinal stenosis of lumbar region 04/12/2016  . Tricuspid valve prolapse     Past Surgical History:  Procedure Laterality Date  . APPENDECTOMY    . BREAST SURGERY    . broken wrist Right 2007  . COLONOSCOPY  2016  . FOOT SURGERY Right   . TONSILLECTOMY       Current Outpatient Prescriptions  Medication Sig Dispense Refill  . acetaminophen (TYLENOL) 500 MG tablet Take 650 mg by mouth. Take 2 tablets daily    . aspirin 81 MG chewable tablet Chew 81 mg by mouth. Take one daily    . celecoxib (CELEBREX) 200 MG capsule Take by mouth. Take one daily    . Cholecalciferol (VITAMIN D3) 2000 units capsule Take by mouth. Take one daily    . clonazePAM (KLONOPIN) 0.5 MG tablet Take by mouth. Take one tablet daily    . DULoxetine (CYMBALTA) 60 MG capsule Take by mouth. Take one tablet daily    . esomeprazole (NEXIUM) 40 MG capsule Take one tablet by mouth once daily for stomach 30 capsule 3  . ipratropium (ATROVENT) 0.06 % nasal spray Place into the nose. One spray nasal every morning as needed    . Lactobacillus-Inulin ([place])  Take by mouth. Take one daily for probiotic    . metoprolol succinate (TOPROL-XL) 25 MG 24 hr tablet Take by mouth. Take one and a half tablets (37.5 mg) daily for blood pressure    . MULTIPLE VITAMINS-MINERALS PO Take by mouth. Take 1/2 tablet daily    . nitroGLYCERIN (NITROSTAT) 0.4 MG SL tablet Place under the tongue. Place one tablet under the tongue every 5 minutes as needed for chest pain. No more than 3    . Riboflavin-Magnesium-Feverfew 200-180-50 MG TABS Take by mouth. Take one tablet every evening    . solifenacin (VESICARE) 5 MG tablet Take by mouth. Take one tablet daily for blood pressure    . traMADol (ULTRAM) 50 MG tablet Take by mouth. Take one every 6 hours as needed for pain     No current  facility-administered medications for this visit.     Allergies:   Sulfa antibiotics; Topiramate; Verapamil; Zolpidem; Prevacid [lansoprazole]; Ace inhibitors; Codeine; and Ibandronic acid    Social History:  The patient  reports that she has never smoked. She has never used smokeless tobacco. She reports that she drinks alcohol. She reports that she does not use drugs.   Family History:  The patient's family history includes HIV (age of onset: 53) in her daughter; Heart disease in her father and mother.   (No early CAD)   ROS:  Please see the history of present illness.   Otherwise, review of systems are positive for none.   All other systems are reviewed and negative.    PHYSICAL EXAM: VS:  BP (!) 144/80   Pulse 64   Ht 5\' 4"  (1.626 m)   Wt 118 lb (53.5 kg)   BMI 20.25 kg/m  , BMI Body mass index is 20.25 kg/m. GENERAL:  Well appearing, somewhat thin and frail HEENT:  Pupils equal round and reactive, fundi not visualized, oral mucosa unremarkable NECK:  No jugular venous distention, waveform within normal limits, carotid upstroke brisk and symmetric, no bruits, no thyromegaly LYMPHATICS:  No cervical, inguinal adenopathy LUNGS:  Clear to auscultation bilaterally BACK:  No CVA tenderness. Lordosis CHEST:  Unremarkable HEART:  PMI not displaced or sustained,S1 and S2 within normal limits, no S3, no S4, no clicks, no rubs, 2 out of 6 mid left sternal border systolic murmur nonradiating, no diastolic murmurs ABD:  Flat, positive bowel sounds normal in frequency in pitch, no bruits, no rebound, no guarding, no midline pulsatile mass, no hepatomegaly, no splenomegaly EXT:  2 plus pulses throughout, no edema, no cyanosis no clubbing, dependent rubor SKIN:  No rashes no nodules NEURO:  Cranial nerves II through XII grossly intact, motor grossly intact throughout PSYCH:  Cognitively intact, oriented to person place and time    EKG:  EKG is ordered today. The ekg ordered today  demonstrates sinus bradycardia, rate 54, left axis deviation, left anterior fascicular block, RSR prime V1 and V2, poor anterior R wave progression, first-degree AV block, no acute ST-T wave changes.   Recent Labs: 06/21/2016: ALT 16; BUN 26; Creat 1.16; Hemoglobin 12.5; Platelets 313; Potassium 4.7; Sodium 133    Lipid Panel No results found for: CHOL, TRIG, HDL, CHOLHDL, VLDL, LDLCALC, LDLDIRECT    Wt Readings from Last 3 Encounters:  06/27/16 118 lb (53.5 kg)  06/21/16 119 lb 3.2 oz (54.1 kg)  04/12/16 120 lb (54.4 kg)      Other studies Reviewed: Additional studies/ records that were reviewed today include: [facility] records and PCP labs. Review of  the above records demonstrates:  Please see elsewhere in the note.     ASSESSMENT AND PLAN:   CAD:  The patient has no new symptoms. No further cardiovascular testing is suggested. We will continue with risk reduction.  AORTIC STENOSIS:  She has mild stenosis with some mild mitral regurgitation and tricuspid regurgitation. These will be followed clinically. No further therapy or further imaging is indicated.  HTN: Her blood pressures have been well controlled recently. No change in therapy is indicated.  DYSLIPIDEMIA:  She says she controls this with diet though I don't have recent readings. She does not want to take a statin.     Current medicines are reviewed at length with the patient today.  The patient does not have concerns regarding medicines.  The following changes have been made:  no change  Labs/ tests ordered today include:  EKG No orders of the defined types were placed in this encounter.    Disposition:   FU with me in six months.  c    Signed, Heather Breeding, MD  06/27/2016 12:44 PM    [place]

## 2016-06-27 ENCOUNTER — Ambulatory Visit (INDEPENDENT_AMBULATORY_CARE_PROVIDER_SITE_OTHER): Payer: Medicare Other | Admitting: Cardiology

## 2016-06-27 ENCOUNTER — Encounter: Payer: Self-pay | Admitting: Cardiology

## 2016-06-27 VITALS — BP 144/80 | HR 64 | Ht 64.0 in | Wt 118.0 lb

## 2016-06-27 DIAGNOSIS — I071 Rheumatic tricuspid insufficiency: Secondary | ICD-10-CM | POA: Diagnosis not present

## 2016-06-27 DIAGNOSIS — I25119 Atherosclerotic heart disease of native coronary artery with unspecified angina pectoris: Secondary | ICD-10-CM

## 2016-06-27 DIAGNOSIS — I340 Nonrheumatic mitral (valve) insufficiency: Secondary | ICD-10-CM | POA: Diagnosis not present

## 2016-06-27 DIAGNOSIS — I2510 Atherosclerotic heart disease of native coronary artery without angina pectoris: Secondary | ICD-10-CM | POA: Diagnosis not present

## 2016-06-27 NOTE — Patient Instructions (Signed)
Your physician wants you to follow-up in: 6 Months You will receive a reminder letter in the mail two months in advance. If you don't receive a letter, please call our office to schedule the follow-up appointment.  

## 2016-06-28 ENCOUNTER — Telehealth: Payer: Self-pay | Admitting: Cardiology

## 2016-06-28 NOTE — Telephone Encounter (Signed)
Faxed Release signed by patient to [facility] to obtain records per Dr Percival Spanish request.  Faxed 06/28/16. lp

## 2016-06-30 ENCOUNTER — Telehealth: Payer: Self-pay | Admitting: Cardiology

## 2016-06-30 DIAGNOSIS — I259 Chronic ischemic heart disease, unspecified: Secondary | ICD-10-CM | POA: Diagnosis not present

## 2016-06-30 DIAGNOSIS — I340 Nonrheumatic mitral (valve) insufficiency: Secondary | ICD-10-CM | POA: Diagnosis not present

## 2016-06-30 DIAGNOSIS — R2681 Unsteadiness on feet: Secondary | ICD-10-CM | POA: Diagnosis not present

## 2016-06-30 DIAGNOSIS — M545 Low back pain: Secondary | ICD-10-CM | POA: Diagnosis not present

## 2016-06-30 DIAGNOSIS — E7800 Pure hypercholesterolemia, unspecified: Secondary | ICD-10-CM | POA: Diagnosis not present

## 2016-06-30 DIAGNOSIS — M419 Scoliosis, unspecified: Secondary | ICD-10-CM | POA: Diagnosis not present

## 2016-06-30 DIAGNOSIS — F0631 Mood disorder due to known physiological condition with depressive features: Secondary | ICD-10-CM | POA: Diagnosis not present

## 2016-06-30 DIAGNOSIS — M4800 Spinal stenosis, site unspecified: Secondary | ICD-10-CM | POA: Diagnosis not present

## 2016-06-30 DIAGNOSIS — M6281 Muscle weakness (generalized): Secondary | ICD-10-CM | POA: Diagnosis not present

## 2016-06-30 NOTE — Addendum Note (Signed)
Addended by: Cristopher Estimable on: 06/30/2016 11:54 AM   Modules accepted: Orders

## 2016-06-30 NOTE — Telephone Encounter (Signed)
Received records from [facility] as requested by Dr Percival Spanish.  Records given to Dr Percival Spanish for review. lp

## 2016-07-04 DIAGNOSIS — E7800 Pure hypercholesterolemia, unspecified: Secondary | ICD-10-CM | POA: Diagnosis not present

## 2016-07-04 DIAGNOSIS — F0631 Mood disorder due to known physiological condition with depressive features: Secondary | ICD-10-CM | POA: Diagnosis not present

## 2016-07-04 DIAGNOSIS — R2681 Unsteadiness on feet: Secondary | ICD-10-CM | POA: Diagnosis not present

## 2016-07-04 DIAGNOSIS — M6281 Muscle weakness (generalized): Secondary | ICD-10-CM | POA: Diagnosis not present

## 2016-07-04 DIAGNOSIS — M545 Low back pain: Secondary | ICD-10-CM | POA: Diagnosis not present

## 2016-07-04 DIAGNOSIS — I259 Chronic ischemic heart disease, unspecified: Secondary | ICD-10-CM | POA: Diagnosis not present

## 2016-07-06 DIAGNOSIS — E7800 Pure hypercholesterolemia, unspecified: Secondary | ICD-10-CM | POA: Diagnosis not present

## 2016-07-06 DIAGNOSIS — M545 Low back pain: Secondary | ICD-10-CM | POA: Diagnosis not present

## 2016-07-06 DIAGNOSIS — Z0100 Encounter for examination of eyes and vision without abnormal findings: Secondary | ICD-10-CM | POA: Diagnosis not present

## 2016-07-06 DIAGNOSIS — R2681 Unsteadiness on feet: Secondary | ICD-10-CM | POA: Diagnosis not present

## 2016-07-06 DIAGNOSIS — H40032 Anatomical narrow angle, left eye: Secondary | ICD-10-CM | POA: Diagnosis not present

## 2016-07-06 DIAGNOSIS — H25013 Cortical age-related cataract, bilateral: Secondary | ICD-10-CM | POA: Diagnosis not present

## 2016-07-06 DIAGNOSIS — M6281 Muscle weakness (generalized): Secondary | ICD-10-CM | POA: Diagnosis not present

## 2016-07-06 DIAGNOSIS — I259 Chronic ischemic heart disease, unspecified: Secondary | ICD-10-CM | POA: Diagnosis not present

## 2016-07-06 DIAGNOSIS — F0631 Mood disorder due to known physiological condition with depressive features: Secondary | ICD-10-CM | POA: Diagnosis not present

## 2016-07-06 DIAGNOSIS — H40031 Anatomical narrow angle, right eye: Secondary | ICD-10-CM | POA: Diagnosis not present

## 2016-07-08 DIAGNOSIS — M6281 Muscle weakness (generalized): Secondary | ICD-10-CM | POA: Diagnosis not present

## 2016-07-08 DIAGNOSIS — F0631 Mood disorder due to known physiological condition with depressive features: Secondary | ICD-10-CM | POA: Diagnosis not present

## 2016-07-08 DIAGNOSIS — E7800 Pure hypercholesterolemia, unspecified: Secondary | ICD-10-CM | POA: Diagnosis not present

## 2016-07-08 DIAGNOSIS — M545 Low back pain: Secondary | ICD-10-CM | POA: Diagnosis not present

## 2016-07-08 DIAGNOSIS — I259 Chronic ischemic heart disease, unspecified: Secondary | ICD-10-CM | POA: Diagnosis not present

## 2016-07-08 DIAGNOSIS — R2681 Unsteadiness on feet: Secondary | ICD-10-CM | POA: Diagnosis not present

## 2016-07-11 DIAGNOSIS — I259 Chronic ischemic heart disease, unspecified: Secondary | ICD-10-CM | POA: Diagnosis not present

## 2016-07-11 DIAGNOSIS — F0631 Mood disorder due to known physiological condition with depressive features: Secondary | ICD-10-CM | POA: Diagnosis not present

## 2016-07-11 DIAGNOSIS — R2681 Unsteadiness on feet: Secondary | ICD-10-CM | POA: Diagnosis not present

## 2016-07-11 DIAGNOSIS — M6281 Muscle weakness (generalized): Secondary | ICD-10-CM | POA: Diagnosis not present

## 2016-07-11 DIAGNOSIS — E7800 Pure hypercholesterolemia, unspecified: Secondary | ICD-10-CM | POA: Diagnosis not present

## 2016-07-11 DIAGNOSIS — M545 Low back pain: Secondary | ICD-10-CM | POA: Diagnosis not present

## 2016-07-13 DIAGNOSIS — R2681 Unsteadiness on feet: Secondary | ICD-10-CM | POA: Diagnosis not present

## 2016-07-13 DIAGNOSIS — M6281 Muscle weakness (generalized): Secondary | ICD-10-CM | POA: Diagnosis not present

## 2016-07-13 DIAGNOSIS — M545 Low back pain: Secondary | ICD-10-CM | POA: Diagnosis not present

## 2016-07-13 DIAGNOSIS — I259 Chronic ischemic heart disease, unspecified: Secondary | ICD-10-CM | POA: Diagnosis not present

## 2016-07-13 DIAGNOSIS — F0631 Mood disorder due to known physiological condition with depressive features: Secondary | ICD-10-CM | POA: Diagnosis not present

## 2016-07-13 DIAGNOSIS — E7800 Pure hypercholesterolemia, unspecified: Secondary | ICD-10-CM | POA: Diagnosis not present

## 2016-07-14 DIAGNOSIS — R2681 Unsteadiness on feet: Secondary | ICD-10-CM | POA: Diagnosis not present

## 2016-07-14 DIAGNOSIS — M6281 Muscle weakness (generalized): Secondary | ICD-10-CM | POA: Diagnosis not present

## 2016-07-14 DIAGNOSIS — F0631 Mood disorder due to known physiological condition with depressive features: Secondary | ICD-10-CM | POA: Diagnosis not present

## 2016-07-14 DIAGNOSIS — E7800 Pure hypercholesterolemia, unspecified: Secondary | ICD-10-CM | POA: Diagnosis not present

## 2016-07-14 DIAGNOSIS — I259 Chronic ischemic heart disease, unspecified: Secondary | ICD-10-CM | POA: Diagnosis not present

## 2016-07-14 DIAGNOSIS — M545 Low back pain: Secondary | ICD-10-CM | POA: Diagnosis not present

## 2016-07-25 DIAGNOSIS — I259 Chronic ischemic heart disease, unspecified: Secondary | ICD-10-CM | POA: Diagnosis not present

## 2016-07-25 DIAGNOSIS — F0631 Mood disorder due to known physiological condition with depressive features: Secondary | ICD-10-CM | POA: Diagnosis not present

## 2016-07-25 DIAGNOSIS — E7800 Pure hypercholesterolemia, unspecified: Secondary | ICD-10-CM | POA: Diagnosis not present

## 2016-07-25 DIAGNOSIS — M545 Low back pain: Secondary | ICD-10-CM | POA: Diagnosis not present

## 2016-07-25 DIAGNOSIS — R2681 Unsteadiness on feet: Secondary | ICD-10-CM | POA: Diagnosis not present

## 2016-07-25 DIAGNOSIS — M6281 Muscle weakness (generalized): Secondary | ICD-10-CM | POA: Diagnosis not present

## 2016-07-26 DIAGNOSIS — H25812 Combined forms of age-related cataract, left eye: Secondary | ICD-10-CM | POA: Diagnosis not present

## 2016-07-26 DIAGNOSIS — H2512 Age-related nuclear cataract, left eye: Secondary | ICD-10-CM | POA: Diagnosis not present

## 2016-07-26 DIAGNOSIS — H25012 Cortical age-related cataract, left eye: Secondary | ICD-10-CM | POA: Diagnosis not present

## 2016-07-28 DIAGNOSIS — I259 Chronic ischemic heart disease, unspecified: Secondary | ICD-10-CM | POA: Diagnosis not present

## 2016-07-28 DIAGNOSIS — F0631 Mood disorder due to known physiological condition with depressive features: Secondary | ICD-10-CM | POA: Diagnosis not present

## 2016-07-28 DIAGNOSIS — R2681 Unsteadiness on feet: Secondary | ICD-10-CM | POA: Diagnosis not present

## 2016-07-28 DIAGNOSIS — E7800 Pure hypercholesterolemia, unspecified: Secondary | ICD-10-CM | POA: Diagnosis not present

## 2016-07-28 DIAGNOSIS — M545 Low back pain: Secondary | ICD-10-CM | POA: Diagnosis not present

## 2016-07-28 DIAGNOSIS — M6281 Muscle weakness (generalized): Secondary | ICD-10-CM | POA: Diagnosis not present

## 2016-08-01 DIAGNOSIS — M6281 Muscle weakness (generalized): Secondary | ICD-10-CM | POA: Diagnosis not present

## 2016-08-01 DIAGNOSIS — I340 Nonrheumatic mitral (valve) insufficiency: Secondary | ICD-10-CM | POA: Diagnosis not present

## 2016-08-01 DIAGNOSIS — E7800 Pure hypercholesterolemia, unspecified: Secondary | ICD-10-CM | POA: Diagnosis not present

## 2016-08-01 DIAGNOSIS — F0631 Mood disorder due to known physiological condition with depressive features: Secondary | ICD-10-CM | POA: Diagnosis not present

## 2016-08-01 DIAGNOSIS — I259 Chronic ischemic heart disease, unspecified: Secondary | ICD-10-CM | POA: Diagnosis not present

## 2016-08-01 DIAGNOSIS — M4800 Spinal stenosis, site unspecified: Secondary | ICD-10-CM | POA: Diagnosis not present

## 2016-08-01 DIAGNOSIS — R2681 Unsteadiness on feet: Secondary | ICD-10-CM | POA: Diagnosis not present

## 2016-08-01 DIAGNOSIS — M545 Low back pain: Secondary | ICD-10-CM | POA: Diagnosis not present

## 2016-08-01 DIAGNOSIS — M419 Scoliosis, unspecified: Secondary | ICD-10-CM | POA: Diagnosis not present

## 2016-08-03 ENCOUNTER — Other Ambulatory Visit: Payer: Self-pay | Admitting: Internal Medicine

## 2016-08-05 DIAGNOSIS — M6281 Muscle weakness (generalized): Secondary | ICD-10-CM | POA: Diagnosis not present

## 2016-08-05 DIAGNOSIS — M545 Low back pain: Secondary | ICD-10-CM | POA: Diagnosis not present

## 2016-08-05 DIAGNOSIS — R2681 Unsteadiness on feet: Secondary | ICD-10-CM | POA: Diagnosis not present

## 2016-08-05 DIAGNOSIS — I259 Chronic ischemic heart disease, unspecified: Secondary | ICD-10-CM | POA: Diagnosis not present

## 2016-08-05 DIAGNOSIS — E7800 Pure hypercholesterolemia, unspecified: Secondary | ICD-10-CM | POA: Diagnosis not present

## 2016-08-05 DIAGNOSIS — F0631 Mood disorder due to known physiological condition with depressive features: Secondary | ICD-10-CM | POA: Diagnosis not present

## 2016-08-08 DIAGNOSIS — F0631 Mood disorder due to known physiological condition with depressive features: Secondary | ICD-10-CM | POA: Diagnosis not present

## 2016-08-08 DIAGNOSIS — M6281 Muscle weakness (generalized): Secondary | ICD-10-CM | POA: Diagnosis not present

## 2016-08-08 DIAGNOSIS — E7800 Pure hypercholesterolemia, unspecified: Secondary | ICD-10-CM | POA: Diagnosis not present

## 2016-08-08 DIAGNOSIS — M545 Low back pain: Secondary | ICD-10-CM | POA: Diagnosis not present

## 2016-08-08 DIAGNOSIS — R2681 Unsteadiness on feet: Secondary | ICD-10-CM | POA: Diagnosis not present

## 2016-08-08 DIAGNOSIS — I259 Chronic ischemic heart disease, unspecified: Secondary | ICD-10-CM | POA: Diagnosis not present

## 2016-08-10 DIAGNOSIS — F0631 Mood disorder due to known physiological condition with depressive features: Secondary | ICD-10-CM | POA: Diagnosis not present

## 2016-08-10 DIAGNOSIS — R2681 Unsteadiness on feet: Secondary | ICD-10-CM | POA: Diagnosis not present

## 2016-08-10 DIAGNOSIS — M545 Low back pain: Secondary | ICD-10-CM | POA: Diagnosis not present

## 2016-08-10 DIAGNOSIS — I259 Chronic ischemic heart disease, unspecified: Secondary | ICD-10-CM | POA: Diagnosis not present

## 2016-08-10 DIAGNOSIS — M6281 Muscle weakness (generalized): Secondary | ICD-10-CM | POA: Diagnosis not present

## 2016-08-10 DIAGNOSIS — E7800 Pure hypercholesterolemia, unspecified: Secondary | ICD-10-CM | POA: Diagnosis not present

## 2016-08-15 DIAGNOSIS — M7062 Trochanteric bursitis, left hip: Secondary | ICD-10-CM | POA: Diagnosis not present

## 2016-08-15 DIAGNOSIS — M545 Low back pain: Secondary | ICD-10-CM | POA: Diagnosis not present

## 2016-08-16 DIAGNOSIS — H2511 Age-related nuclear cataract, right eye: Secondary | ICD-10-CM | POA: Diagnosis not present

## 2016-08-16 DIAGNOSIS — H25011 Cortical age-related cataract, right eye: Secondary | ICD-10-CM | POA: Diagnosis not present

## 2016-08-16 DIAGNOSIS — H25811 Combined forms of age-related cataract, right eye: Secondary | ICD-10-CM | POA: Diagnosis not present

## 2016-08-17 ENCOUNTER — Other Ambulatory Visit: Payer: Self-pay | Admitting: Internal Medicine

## 2016-08-18 DIAGNOSIS — F0631 Mood disorder due to known physiological condition with depressive features: Secondary | ICD-10-CM | POA: Diagnosis not present

## 2016-08-18 DIAGNOSIS — M545 Low back pain: Secondary | ICD-10-CM | POA: Diagnosis not present

## 2016-08-18 DIAGNOSIS — I259 Chronic ischemic heart disease, unspecified: Secondary | ICD-10-CM | POA: Diagnosis not present

## 2016-08-18 DIAGNOSIS — R2681 Unsteadiness on feet: Secondary | ICD-10-CM | POA: Diagnosis not present

## 2016-08-18 DIAGNOSIS — E7800 Pure hypercholesterolemia, unspecified: Secondary | ICD-10-CM | POA: Diagnosis not present

## 2016-08-18 DIAGNOSIS — M6281 Muscle weakness (generalized): Secondary | ICD-10-CM | POA: Diagnosis not present

## 2016-08-22 ENCOUNTER — Other Ambulatory Visit: Payer: Self-pay | Admitting: Internal Medicine

## 2016-08-22 DIAGNOSIS — D692 Other nonthrombocytopenic purpura: Secondary | ICD-10-CM | POA: Diagnosis not present

## 2016-08-22 DIAGNOSIS — F0631 Mood disorder due to known physiological condition with depressive features: Secondary | ICD-10-CM | POA: Diagnosis not present

## 2016-08-22 DIAGNOSIS — M6281 Muscle weakness (generalized): Secondary | ICD-10-CM | POA: Diagnosis not present

## 2016-08-22 DIAGNOSIS — M545 Low back pain: Secondary | ICD-10-CM | POA: Diagnosis not present

## 2016-08-22 DIAGNOSIS — L814 Other melanin hyperpigmentation: Secondary | ICD-10-CM | POA: Diagnosis not present

## 2016-08-22 DIAGNOSIS — L570 Actinic keratosis: Secondary | ICD-10-CM | POA: Diagnosis not present

## 2016-08-22 DIAGNOSIS — B351 Tinea unguium: Secondary | ICD-10-CM | POA: Diagnosis not present

## 2016-08-22 DIAGNOSIS — I259 Chronic ischemic heart disease, unspecified: Secondary | ICD-10-CM | POA: Diagnosis not present

## 2016-08-22 DIAGNOSIS — R2681 Unsteadiness on feet: Secondary | ICD-10-CM | POA: Diagnosis not present

## 2016-08-22 DIAGNOSIS — E7800 Pure hypercholesterolemia, unspecified: Secondary | ICD-10-CM | POA: Diagnosis not present

## 2016-08-22 DIAGNOSIS — L821 Other seborrheic keratosis: Secondary | ICD-10-CM | POA: Diagnosis not present

## 2016-08-22 DIAGNOSIS — D1801 Hemangioma of skin and subcutaneous tissue: Secondary | ICD-10-CM | POA: Diagnosis not present

## 2016-08-24 DIAGNOSIS — R2681 Unsteadiness on feet: Secondary | ICD-10-CM | POA: Diagnosis not present

## 2016-08-24 DIAGNOSIS — E7800 Pure hypercholesterolemia, unspecified: Secondary | ICD-10-CM | POA: Diagnosis not present

## 2016-08-24 DIAGNOSIS — I259 Chronic ischemic heart disease, unspecified: Secondary | ICD-10-CM | POA: Diagnosis not present

## 2016-08-24 DIAGNOSIS — F0631 Mood disorder due to known physiological condition with depressive features: Secondary | ICD-10-CM | POA: Diagnosis not present

## 2016-08-24 DIAGNOSIS — M6281 Muscle weakness (generalized): Secondary | ICD-10-CM | POA: Diagnosis not present

## 2016-08-24 DIAGNOSIS — M545 Low back pain: Secondary | ICD-10-CM | POA: Diagnosis not present

## 2016-08-29 ENCOUNTER — Other Ambulatory Visit: Payer: Self-pay | Admitting: Internal Medicine

## 2016-08-29 DIAGNOSIS — M4800 Spinal stenosis, site unspecified: Secondary | ICD-10-CM | POA: Diagnosis not present

## 2016-08-29 DIAGNOSIS — E7800 Pure hypercholesterolemia, unspecified: Secondary | ICD-10-CM | POA: Diagnosis not present

## 2016-08-29 DIAGNOSIS — M545 Low back pain: Secondary | ICD-10-CM | POA: Diagnosis not present

## 2016-08-29 DIAGNOSIS — M6281 Muscle weakness (generalized): Secondary | ICD-10-CM | POA: Diagnosis not present

## 2016-08-29 DIAGNOSIS — F0631 Mood disorder due to known physiological condition with depressive features: Secondary | ICD-10-CM | POA: Diagnosis not present

## 2016-08-29 DIAGNOSIS — I259 Chronic ischemic heart disease, unspecified: Secondary | ICD-10-CM | POA: Diagnosis not present

## 2016-08-29 DIAGNOSIS — R2681 Unsteadiness on feet: Secondary | ICD-10-CM | POA: Diagnosis not present

## 2016-08-29 DIAGNOSIS — M419 Scoliosis, unspecified: Secondary | ICD-10-CM | POA: Diagnosis not present

## 2016-08-29 DIAGNOSIS — I340 Nonrheumatic mitral (valve) insufficiency: Secondary | ICD-10-CM | POA: Diagnosis not present

## 2016-09-01 DIAGNOSIS — R2681 Unsteadiness on feet: Secondary | ICD-10-CM | POA: Diagnosis not present

## 2016-09-01 DIAGNOSIS — F0631 Mood disorder due to known physiological condition with depressive features: Secondary | ICD-10-CM | POA: Diagnosis not present

## 2016-09-01 DIAGNOSIS — M6281 Muscle weakness (generalized): Secondary | ICD-10-CM | POA: Diagnosis not present

## 2016-09-01 DIAGNOSIS — E7800 Pure hypercholesterolemia, unspecified: Secondary | ICD-10-CM | POA: Diagnosis not present

## 2016-09-01 DIAGNOSIS — I259 Chronic ischemic heart disease, unspecified: Secondary | ICD-10-CM | POA: Diagnosis not present

## 2016-09-01 DIAGNOSIS — M545 Low back pain: Secondary | ICD-10-CM | POA: Diagnosis not present

## 2016-09-06 DIAGNOSIS — R2681 Unsteadiness on feet: Secondary | ICD-10-CM | POA: Diagnosis not present

## 2016-09-06 DIAGNOSIS — F0631 Mood disorder due to known physiological condition with depressive features: Secondary | ICD-10-CM | POA: Diagnosis not present

## 2016-09-06 DIAGNOSIS — M545 Low back pain: Secondary | ICD-10-CM | POA: Diagnosis not present

## 2016-09-06 DIAGNOSIS — I259 Chronic ischemic heart disease, unspecified: Secondary | ICD-10-CM | POA: Diagnosis not present

## 2016-09-06 DIAGNOSIS — M6281 Muscle weakness (generalized): Secondary | ICD-10-CM | POA: Diagnosis not present

## 2016-09-06 DIAGNOSIS — E7800 Pure hypercholesterolemia, unspecified: Secondary | ICD-10-CM | POA: Diagnosis not present

## 2016-09-07 ENCOUNTER — Other Ambulatory Visit: Payer: Self-pay | Admitting: Internal Medicine

## 2016-09-12 DIAGNOSIS — R2681 Unsteadiness on feet: Secondary | ICD-10-CM | POA: Diagnosis not present

## 2016-09-12 DIAGNOSIS — M6281 Muscle weakness (generalized): Secondary | ICD-10-CM | POA: Diagnosis not present

## 2016-09-12 DIAGNOSIS — E7800 Pure hypercholesterolemia, unspecified: Secondary | ICD-10-CM | POA: Diagnosis not present

## 2016-09-12 DIAGNOSIS — M545 Low back pain: Secondary | ICD-10-CM | POA: Diagnosis not present

## 2016-09-12 DIAGNOSIS — I259 Chronic ischemic heart disease, unspecified: Secondary | ICD-10-CM | POA: Diagnosis not present

## 2016-09-12 DIAGNOSIS — F0631 Mood disorder due to known physiological condition with depressive features: Secondary | ICD-10-CM | POA: Diagnosis not present

## 2016-09-13 ENCOUNTER — Other Ambulatory Visit: Payer: Self-pay | Admitting: Internal Medicine

## 2016-09-14 ENCOUNTER — Other Ambulatory Visit: Payer: Self-pay | Admitting: Internal Medicine

## 2016-09-14 ENCOUNTER — Encounter: Payer: Self-pay | Admitting: Internal Medicine

## 2016-09-14 ENCOUNTER — Ambulatory Visit (INDEPENDENT_AMBULATORY_CARE_PROVIDER_SITE_OTHER): Payer: Medicare Other | Admitting: Internal Medicine

## 2016-09-14 VITALS — BP 130/80 | HR 59 | Temp 97.8°F | Ht 64.0 in | Wt 121.0 lb

## 2016-09-14 DIAGNOSIS — I10 Essential (primary) hypertension: Secondary | ICD-10-CM

## 2016-09-14 DIAGNOSIS — D649 Anemia, unspecified: Secondary | ICD-10-CM | POA: Diagnosis not present

## 2016-09-14 DIAGNOSIS — E785 Hyperlipidemia, unspecified: Secondary | ICD-10-CM

## 2016-09-14 DIAGNOSIS — I2510 Atherosclerotic heart disease of native coronary artery without angina pectoris: Secondary | ICD-10-CM

## 2016-09-14 DIAGNOSIS — R739 Hyperglycemia, unspecified: Secondary | ICD-10-CM | POA: Diagnosis not present

## 2016-09-14 DIAGNOSIS — F3340 Major depressive disorder, recurrent, in remission, unspecified: Secondary | ICD-10-CM

## 2016-09-14 DIAGNOSIS — Z23 Encounter for immunization: Secondary | ICD-10-CM | POA: Diagnosis not present

## 2016-09-14 HISTORY — DX: Hyperglycemia, unspecified: R73.9

## 2016-09-14 NOTE — Progress Notes (Signed)
Facility  [place]    Place of Service:   OFFICE    Allergies  Allergen Reactions  . Sulfa Antibiotics Other (See Comments)    headaches  . Topiramate Rash  . Verapamil Other (See Comments)    constipation  . Zolpidem Other (See Comments)    hallucinations  . Prevacid [Lansoprazole]     Chest pain  . Ace Inhibitors Cough  . Codeine Nausea Only  . Ibandronic Acid Nausea Only    Chief Complaint  Patient presents with  . Medical Management of Chronic Issues    Wants to talk about hyperglycemia  . Immunizations    Wants flu shot    HPI:  She has had at least 3 episodes where she thought she might black out. Had feelings of feeling jittery and weak. Gets better if she eats something. Food helped. Some clammy perspiration.  No nausea. Does not associate the episodes with use of any of her medication.  Essential hypertension - controlled  Encounter for immunization - Plan: Flu Vaccine QUAD 36+ mos IM  Hyperglycemia - glu 113m% 2 months ago. No prior hx of glucose abnormalities.  Hyperlipidemia, unspecified hyperlipidemia type - f/u lab  Anemia, unspecified type - f/u/ lab.  Recurrent major depressive disorder, in remission ([place] - improved    Medications: Patient's Medications  New Prescriptions   No medications on file  Previous Medications   ACETAMINOPHEN (TYLENOL) 500 MG TABLET    Take 650 mg by mouth. Take 2 tablets daily   ASPIRIN 81 MG CHEWABLE TABLET    Chew 81 mg by mouth. Take one daily   CELECOXIB (CELEBREX) 200 MG CAPSULE    TAKE 1 CAPSULE BY MOUTH EVERY DAY   CHOLECALCIFEROL (VITAMIN D3) 2000 UNITS CAPSULE    Take by mouth. Take one daily   CLONAZEPAM (KLONOPIN) 0.5 MG TABLET    TAKE 1 OR 2 TABLETS BY MOUTH EVERY NIGHT AT BEDTIME FOR RESTLESS LEGS   DULOXETINE (CYMBALTA) 60 MG CAPSULE    TAKE 1 CAPSULE BY MOUTH DAILY FOR DEPRESSION   ESOMEPRAZOLE (NEXIUM) 40 MG CAPSULE    TAKE ONE CAPSULE BY MOUTH DAILY FOR STOMACH   IPRATROPIUM (ATROVENT) 0.06 %  NASAL SPRAY    Place into the nose. One spray nasal every morning as needed   LACTOBACILLUS-INULIN ([place])    Take by mouth. Take one daily for probiotic   METOPROLOL SUCCINATE (TOPROL-XL) 25 MG 24 HR TABLET    TAKE 1& 1/2 TABLETS BY MOUTH DAILY   MULTIPLE VITAMINS-MINERALS PO    Take by mouth. Take 1/2 tablet daily   NITROGLYCERIN (NITROSTAT) 0.4 MG SL TABLET    Place under the tongue. Place one tablet under the tongue every 5 minutes as needed for chest pain. No more than 3   RIBOFLAVIN-MAGNESIUM-FEVERFEW 200-180-50 MG TABS    Take by mouth. Take one tablet every evening   TRAMADOL (ULTRAM) 50 MG TABLET    TAKE 1 TABLET BY MOUTH EVERY 4 HOURS   VESICARE 5 MG TABLET    TAKE 1 TABLET BY MOUTH DAILY  Modified Medications   No medications on file  Discontinued Medications   No medications on file    Review of Systems  Constitutional: Negative for activity change, appetite change, chills, diaphoresis, fatigue, fever and unexpected weight change.  HENT: Positive for hearing loss (bilateral aides). Negative for congestion, ear discharge, ear pain, postnasal drip, rhinorrhea, sore throat, tinnitus, trouble swallowing and voice change.   Eyes:  Negative for pain, redness, itching and visual disturbance.  Respiratory: Negative for cough, choking, shortness of breath and wheezing.   Cardiovascular: Negative for chest pain, palpitations and leg swelling.       CAD with stent 2008  Gastrointestinal: Negative for abdominal distention, abdominal pain, constipation, diarrhea and nausea.  Endocrine: Negative for cold intolerance, heat intolerance, polydipsia, polyphagia and polyuria.       Hyperglycemia on nonfasting lab in July 2017.  Genitourinary: Negative for difficulty urinating, dysuria, flank pain, frequency, hematuria, pelvic pain, urgency and vaginal discharge.       UTI a couple of times in the past.  Musculoskeletal: Positive for gait problem (loss of balance). Negative  for arthralgias, back pain, myalgias, neck pain and neck stiffness.       Scoliosis. Right sciatica from spinal stenosis. Receives epidural injections.  Skin: Negative for color change, pallor and rash.       Reports petechia  Allergic/Immunologic: Negative.   Neurological: Negative for dizziness, tremors, seizures, syncope, weakness, numbness and headaches.       Patient reports balance issues.  Hematological: Negative for adenopathy. Does not bruise/bleed easily.       Pernicious anemia. Gets monthly B12 injection.  Psychiatric/Behavioral: Negative for agitation, behavioral problems, confusion, dysphoric mood, hallucinations, sleep disturbance and suicidal ideas. The patient is not nervous/anxious and is not hyperactive.        Hx of depression. In remission on Cymbalta.    Vitals:   09/14/16 1316  BP: 130/80  Pulse: (!) 59  Temp: 97.8 F (36.6 C)  TempSrc: Oral  SpO2: 94%  Weight: 121 lb (54.9 kg)  Height: '5\' 4"'  (1.626 m)   Body mass index is 20.77 kg/m. Wt Readings from Last 3 Encounters:  09/14/16 121 lb (54.9 kg)  06/27/16 118 lb (53.5 kg)  06/21/16 119 lb 3.2 oz (54.1 kg)      Physical Exam  Constitutional: She is oriented to person, place, and time. She appears well-developed and well-nourished. No distress.  HENT:  Nose: Nose normal.  Mouth/Throat: Oropharynx is clear and moist. No oropharyngeal exudate.  Bilateral hearing loss  Eyes: Conjunctivae and EOM are normal. Pupils are equal, round, and reactive to light. No scleral icterus.  Neck: No JVD present. No tracheal deviation present. No thyromegaly present.  Cardiovascular: Normal rate, regular rhythm, normal heart sounds and intact distal pulses.  Exam reveals no gallop and no friction rub.   No murmur heard. Pulmonary/Chest: Effort normal. No respiratory distress. She has no wheezes. She has no rales. She exhibits no tenderness.  Abdominal: She exhibits no distension and no mass. There is no tenderness.    Musculoskeletal: Normal range of motion. She exhibits no edema or tenderness.  Moderately severe scoliosis with greater thoracic curve to the right.  Lymphadenopathy:    She has no cervical adenopathy.  Neurological: She is alert and oriented to person, place, and time. No cranial nerve deficit. Coordination normal.  Poor balance. Unable to perform tandem gait.  Skin: No rash noted. She is not diaphoretic. No erythema. No pallor.  Redness discoloration to the tips of the fingers and of the toes and feet. Suspect this is related to her Raynaud's syndrome.  Psychiatric: She has a normal mood and affect. Her behavior is normal. Judgment and thought content normal.    Labs reviewed: Lab Summary Latest Ref Rng & Units 06/21/2016  Hemoglobin 11.7 - 15.5 g/dL 12.5  Hematocrit 35.0 - 45.0 % 37.5  White count 3.8 - 10.8  K/uL 10.2  Platelet count 140 - 400 K/uL 313  Sodium 135 - 146 mmol/L 133(L)  Potassium 3.5 - 5.3 mmol/L 4.7  Calcium 8.6 - 10.4 mg/dL 9.7  Phosphorus - (None)  Creatinine 0.60 - 0.88 mg/dL 1.16(H)  AST 10 - 35 U/L 23  Alk Phos 33 - 130 U/L 111  Bilirubin 0.2 - 1.2 mg/dL 0.4  Glucose 65 - 99 mg/dL 143(H)  Cholesterol - (None)  HDL cholesterol - (None)  Triglycerides - (None)  LDL Direct - (None)  LDL Calc - (None)  Total protein 6.1 - 8.1 g/dL 6.9  Albumin 3.6 - 5.1 g/dL 4.4   No results found for: TSH, T3TOTAL, T4TOTAL, THYROIDAB Lab Results  Component Value Date   BUN 26 (H) 06/21/2016   No results found for: HGBA1C  Assessment/Plan  1. Essential hypertension controlled  2. Hyperglycemia - Comprehensive metabolic panel - Hemoglobin A1c  3. Hyperlipidemia, unspecified hyperlipidemia type - Lipid panel  4. Anemia, unspecified type - CBC with Differential/Platelet  5. Recurrent major depressive disorder, in remission ([place]) In remission  6. Encounter for immunization - Flu Vaccine QUAD 36+ mos IM

## 2016-09-15 LAB — CBC WITH DIFFERENTIAL/PLATELET
BASOS PCT: 1 %
Basophils Absolute: 61 cells/uL (ref 0–200)
EOS PCT: 4 %
Eosinophils Absolute: 244 cells/uL (ref 15–500)
HCT: 36.9 % (ref 35.0–45.0)
HEMOGLOBIN: 12.3 g/dL (ref 11.7–15.5)
LYMPHS ABS: 1281 {cells}/uL (ref 850–3900)
Lymphocytes Relative: 21 %
MCH: 35.3 pg — ABNORMAL HIGH (ref 27.0–33.0)
MCHC: 33.3 g/dL (ref 32.0–36.0)
MCV: 106 fL — ABNORMAL HIGH (ref 80.0–100.0)
MPV: 10 fL (ref 7.5–12.5)
Monocytes Absolute: 549 cells/uL (ref 200–950)
Monocytes Relative: 9 %
NEUTROS ABS: 3965 {cells}/uL (ref 1500–7800)
NEUTROS PCT: 65 %
Platelets: 388 10*3/uL (ref 140–400)
RBC: 3.48 MIL/uL — AB (ref 3.80–5.10)
RDW: 13.3 % (ref 11.0–15.0)
WBC: 6.1 10*3/uL (ref 3.8–10.8)

## 2016-09-15 LAB — COMPREHENSIVE METABOLIC PANEL
ALBUMIN: 4.2 g/dL (ref 3.6–5.1)
ALT: 18 U/L (ref 6–29)
AST: 26 U/L (ref 10–35)
Alkaline Phosphatase: 119 U/L (ref 33–130)
BILIRUBIN TOTAL: 0.5 mg/dL (ref 0.2–1.2)
BUN: 26 mg/dL — ABNORMAL HIGH (ref 7–25)
CO2: 24 mmol/L (ref 20–31)
CREATININE: 0.91 mg/dL — AB (ref 0.60–0.88)
Calcium: 9.7 mg/dL (ref 8.6–10.4)
Chloride: 96 mmol/L — ABNORMAL LOW (ref 98–110)
Glucose, Bld: 137 mg/dL — ABNORMAL HIGH (ref 65–99)
Potassium: 5.4 mmol/L — ABNORMAL HIGH (ref 3.5–5.3)
SODIUM: 135 mmol/L (ref 135–146)
Total Protein: 6.5 g/dL (ref 6.1–8.1)

## 2016-09-15 LAB — HEMOGLOBIN A1C
Hgb A1c MFr Bld: 6.5 % — ABNORMAL HIGH (ref <5.7)
MEAN PLASMA GLUCOSE: 140 mg/dL

## 2016-09-15 LAB — LIPID PANEL
CHOL/HDL RATIO: 2.3 ratio (ref <=5.0)
Cholesterol: 232 mg/dL — ABNORMAL HIGH (ref 125–200)
HDL: 99 mg/dL (ref >=46)
LDL Cholesterol: 99 mg/dL (ref <130)
Triglycerides: 168 mg/dL — ABNORMAL HIGH (ref <150)
VLDL: 34 mg/dL — AB (ref <30)

## 2016-09-19 ENCOUNTER — Telehealth: Payer: Self-pay | Admitting: *Deleted

## 2016-09-19 DIAGNOSIS — M7062 Trochanteric bursitis, left hip: Secondary | ICD-10-CM | POA: Diagnosis not present

## 2016-09-19 DIAGNOSIS — M545 Low back pain: Secondary | ICD-10-CM | POA: Diagnosis not present

## 2016-09-19 NOTE — Telephone Encounter (Signed)
Patient called requesting her bloodwork results done on 09/14/16. Please Advise.

## 2016-09-20 ENCOUNTER — Other Ambulatory Visit: Payer: Self-pay

## 2016-09-20 DIAGNOSIS — I2510 Atherosclerotic heart disease of native coronary artery without angina pectoris: Secondary | ICD-10-CM

## 2016-09-20 DIAGNOSIS — I10 Essential (primary) hypertension: Secondary | ICD-10-CM

## 2016-09-20 DIAGNOSIS — D510 Vitamin B12 deficiency anemia due to intrinsic factor deficiency: Secondary | ICD-10-CM

## 2016-09-20 DIAGNOSIS — E785 Hyperlipidemia, unspecified: Secondary | ICD-10-CM

## 2016-09-21 ENCOUNTER — Other Ambulatory Visit: Payer: Self-pay

## 2016-09-21 DIAGNOSIS — E785 Hyperlipidemia, unspecified: Secondary | ICD-10-CM

## 2016-09-21 DIAGNOSIS — I10 Essential (primary) hypertension: Secondary | ICD-10-CM

## 2016-09-21 DIAGNOSIS — I2510 Atherosclerotic heart disease of native coronary artery without angina pectoris: Secondary | ICD-10-CM

## 2016-09-21 DIAGNOSIS — D510 Vitamin B12 deficiency anemia due to intrinsic factor deficiency: Secondary | ICD-10-CM

## 2016-09-23 NOTE — Telephone Encounter (Signed)
Chemistry panel is [place]. Glucose was a little high at 137, but 3 months ago it was 143. Hgb A1c was in a good range at 6.5 (we want this to be <7.0). Lipid panel was fine. The LDL, bad cholesterol, was only 99, and the HDL, good cholesterol, was 99, too. High HDL is a good thing. CBC shows the RBC slightly large with a MCV of 106. There is no anemia and the WBC are completely normal.  There is nothing in this lab that suggests an etiology for her feeling that she might black out.

## 2016-09-23 NOTE — Telephone Encounter (Signed)
Patient notified and agreed.  

## 2016-09-26 ENCOUNTER — Other Ambulatory Visit: Payer: Self-pay | Admitting: Internal Medicine

## 2016-09-28 ENCOUNTER — Other Ambulatory Visit: Payer: Self-pay | Admitting: Internal Medicine

## 2016-09-29 ENCOUNTER — Other Ambulatory Visit: Payer: Self-pay | Admitting: Internal Medicine

## 2016-09-29 DIAGNOSIS — M545 Low back pain: Secondary | ICD-10-CM | POA: Diagnosis not present

## 2016-09-30 ENCOUNTER — Other Ambulatory Visit: Payer: Self-pay | Admitting: *Deleted

## 2016-09-30 MED ORDER — IPRATROPIUM BROMIDE 0.06 % NA SOLN: NASAL | 3 refills | Status: AC

## 2016-09-30 NOTE — Telephone Encounter (Signed)
Patient requested to be faxed to pharmacy 

## 2016-10-04 DIAGNOSIS — M545 Low back pain: Secondary | ICD-10-CM | POA: Diagnosis not present

## 2016-10-04 DIAGNOSIS — M7062 Trochanteric bursitis, left hip: Secondary | ICD-10-CM | POA: Diagnosis not present

## 2016-10-04 DIAGNOSIS — M4120 Other idiopathic scoliosis, site unspecified: Secondary | ICD-10-CM | POA: Diagnosis not present

## 2016-10-04 DIAGNOSIS — M47817 Spondylosis without myelopathy or radiculopathy, lumbosacral region: Secondary | ICD-10-CM | POA: Diagnosis not present

## 2016-10-11 ENCOUNTER — Non-Acute Institutional Stay: Payer: Medicare Other | Admitting: Internal Medicine

## 2016-10-11 ENCOUNTER — Encounter: Payer: Self-pay | Admitting: Internal Medicine

## 2016-10-11 VITALS — BP 118/74 | HR 60 | Temp 97.6°F | Ht 64.0 in | Wt 118.0 lb

## 2016-10-11 DIAGNOSIS — I2510 Atherosclerotic heart disease of native coronary artery without angina pectoris: Secondary | ICD-10-CM | POA: Diagnosis not present

## 2016-10-11 DIAGNOSIS — R3915 Urgency of urination: Secondary | ICD-10-CM

## 2016-10-11 DIAGNOSIS — D649 Anemia, unspecified: Secondary | ICD-10-CM | POA: Diagnosis not present

## 2016-10-11 DIAGNOSIS — R739 Hyperglycemia, unspecified: Secondary | ICD-10-CM

## 2016-10-11 NOTE — Progress Notes (Signed)
Facility  [facility]    Place of Service: [facility] (12)     Allergies  Allergen Reactions  . Sulfa Antibiotics Other (See Comments)    headaches  . Topiramate Rash  . Verapamil Other (See Comments)    constipation  . Zolpidem Other (See Comments)    hallucinations  . Prevacid [Lansoprazole]     Chest pain  . Ace Inhibitors Cough  . Codeine Nausea Only  . Ibandronic Acid Nausea Only    Chief Complaint  Patient presents with  . Medical Management of Chronic Issues    one month medication management blood pressure, hyperglycemia, anemia. Patient is decreasing the Vesicare and plans on stopping it.     HPI:  On 09/14/16 , she reported at least 3 episodes when she thought she might black out. Lab following that visit was basically OK. She had glucose 137.  Hyperglycemia - suspect glucose abnormalities may be associated with her episodes that she feels like she may black out.  Anemia, unspecified type - resolved  Urgency of micturition - Vesicare is not helpful. She is tapering off it.    Medications: Patient's Medications  New Prescriptions   No medications on file  Previous Medications   ACETAMINOPHEN (TYLENOL) 500 MG TABLET    Take 650 mg by mouth. Take 2 tablets daily   ASPIRIN 81 MG CHEWABLE TABLET    Chew 81 mg by mouth. Take one daily   CELECOXIB (CELEBREX) 200 MG CAPSULE    TAKE 1 CAPSULE BY MOUTH EVERY DAY   CHOLECALCIFEROL (VITAMIN D3) 2000 UNITS CAPSULE    Take by mouth. Take one daily   CLONAZEPAM (KLONOPIN) 0.5 MG TABLET    TAKE 1 TO 2 TABLETS BY MOUTH EVERY NIGHT AT BEDTIME FOR RESTLESS LEGS   DULOXETINE (CYMBALTA) 60 MG CAPSULE    TAKE 1 CAPSULE BY MOUTH DAILY FOR DEPRESSION   ESOMEPRAZOLE (NEXIUM) 40 MG CAPSULE    TAKE ONE CAPSULE BY MOUTH DAILY FOR STOMACH   IPRATROPIUM (ATROVENT) 0.06 % NASAL SPRAY    Use 2 sprays in each nostril up to three times daily before meals for sinus drainage.   LACTOBACILLUS-INULIN ([place] PO)    Take by  mouth. Take one daily for probiotic   METOPROLOL SUCCINATE (TOPROL-XL) 25 MG 24 HR TABLET    TAKE 1& 1/2 TABLETS BY MOUTH DAILY   MULTIPLE VITAMINS-MINERALS PO    Take by mouth. Take 1/2 tablet daily   NITROGLYCERIN (NITROSTAT) 0.4 MG SL TABLET    Place under the tongue. Place one tablet under the tongue every 5 minutes as needed for chest pain. No more than 3   RIBOFLAVIN-MAGNESIUM-FEVERFEW 200-180-50 MG TABS    Take by mouth. Take one tablet every evening   TRAMADOL (ULTRAM) 50 MG TABLET    TAKE 1 TABLET BY MOUTH EVERY 4 HOURS   VESICARE 5 MG TABLET    TAKE 1 TABLET BY MOUTH DAILY  Modified Medications   No medications on file  Discontinued Medications   No medications on file     Review of Systems  Constitutional: Negative for activity change, appetite change, chills, diaphoresis, fatigue, fever and unexpected weight change.  HENT: Positive for hearing loss (bilateral aides). Negative for congestion, ear discharge, ear pain, postnasal drip, rhinorrhea, sore throat, tinnitus, trouble swallowing and voice change.   Eyes: Negative for pain, redness, itching and visual disturbance.  Respiratory: Negative for cough, choking, shortness of breath and wheezing.   Cardiovascular: Negative for chest  pain, palpitations and leg swelling.       CAD with stent 2008  Gastrointestinal: Negative for abdominal distention, abdominal pain, constipation, diarrhea and nausea.  Endocrine: Negative for cold intolerance, heat intolerance, polydipsia, polyphagia and polyuria.       Hyperglycemia on nonfasting lab in July 2017.  Genitourinary: Positive for urgency. Negative for difficulty urinating, dysuria, flank pain, frequency, hematuria, pelvic pain and vaginal discharge.       UTI a couple of times in the past.  Musculoskeletal: Positive for gait problem (loss of balance). Negative for arthralgias, back pain, myalgias, neck pain and neck stiffness.       Scoliosis. Right sciatica from spinal stenosis.  Receives epidural injections.  Skin: Negative for color change, pallor and rash.       Reports petechia  Allergic/Immunologic: Negative.   Neurological: Negative for dizziness, tremors, seizures, syncope, weakness, numbness and headaches.       Patient reports balance issues.  Hematological: Negative for adenopathy. Does not bruise/bleed easily.       Pernicious anemia. Gets monthly B12 injection.  Psychiatric/Behavioral: Negative for agitation, behavioral problems, confusion, dysphoric mood, hallucinations, sleep disturbance and suicidal ideas. The patient is not nervous/anxious and is not hyperactive.        Hx of depression. In remission on Cymbalta.    Vitals:   10/11/16 1049  BP: 118/74  Pulse: 60  Temp: 97.6 F (36.4 C)  TempSrc: Oral  SpO2: 99%  Weight: 118 lb (53.5 kg)  Height: '5\' 4"'  (1.626 m)   Wt Readings from Last 3 Encounters:  10/11/16 118 lb (53.5 kg)  09/14/16 121 lb (54.9 kg)  06/27/16 118 lb (53.5 kg)    Body mass index is 20.25 kg/m.  Physical Exam  Constitutional: She is oriented to person, place, and time. She appears well-developed and well-nourished. No distress.  HENT:  Nose: Nose normal.  Mouth/Throat: Oropharynx is clear and moist. No oropharyngeal exudate.  Bilateral hearing loss  Eyes: Conjunctivae and EOM are normal. Pupils are equal, round, and reactive to light. No scleral icterus.  Neck: No JVD present. No tracheal deviation present. No thyromegaly present.  Cardiovascular: Normal rate, regular rhythm, normal heart sounds and intact distal pulses.  Exam reveals no gallop and no friction rub.   No murmur heard. Pulmonary/Chest: Effort normal. No respiratory distress. She has no wheezes. She has no rales. She exhibits no tenderness.  Abdominal: She exhibits no distension and no mass. There is no tenderness.  Musculoskeletal: Normal range of motion. She exhibits no edema or tenderness.  Moderately severe scoliosis with greater thoracic curve to  the right.  Lymphadenopathy:    She has no cervical adenopathy.  Neurological: She is alert and oriented to person, place, and time. No cranial nerve deficit. Coordination normal.  Poor balance. Unable to perform tandem gait.  Skin: No rash noted. She is not diaphoretic. No erythema. No pallor.  Redness discoloration to the tips of the fingers and of the toes and feet. Suspect this is related to her Raynaud's syndrome.  Psychiatric: She has a normal mood and affect. Her behavior is normal. Judgment and thought content normal.     Labs reviewed: Lab Summary Latest Ref Rng & Units 09/14/2016 06/21/2016  Hemoglobin 11.7 - 15.5 g/dL 12.3 12.5  Hematocrit 35.0 - 45.0 % 36.9 37.5  White count 3.8 - 10.8 K/uL 6.1 10.2  Platelet count 140 - 400 K/uL 388 313  Sodium 135 - 146 mmol/L 135 133(L)  Potassium 3.5 -  5.3 mmol/L 5.4(H) 4.7  Calcium 8.6 - 10.4 mg/dL 9.7 9.7  Phosphorus - (None) (None)  Creatinine 0.60 - 0.88 mg/dL 0.91(H) 1.16(H)  AST 10 - 35 U/L 26 23  Alk Phos 33 - 130 U/L 119 111  Bilirubin 0.2 - 1.2 mg/dL 0.5 0.4  Glucose 65 - 99 mg/dL 137(H) 143(H)  Cholesterol 125 - 200 mg/dL 232(H) (None)  HDL cholesterol >=46 mg/dL 99 (None)  Triglycerides <150 mg/dL 168(H) (None)  LDL Direct - (None) (None)  LDL Calc <130 mg/dL 99 (None)  Total protein 6.1 - 8.1 g/dL 6.5 6.9  Albumin 3.6 - 5.1 g/dL 4.2 4.4  Some recent data might be hidden   No results found for: TSH Lab Results  Component Value Date   BUN 26 (H) 09/14/2016   BUN 26 (H) 06/21/2016   Lab Results  Component Value Date   CREATININE 0.91 (H) 09/14/2016   CREATININE 1.16 (H) 06/21/2016   Lab Results  Component Value Date   HGBA1C 6.5 (H) 09/14/2016    Assessment/Plan  1. Hyperglycemia She is likely a diabetic. Suspect she may have episodes of hypoglycemia as well. Dietary control only. -BMP, A!C, future  2. Anemia, unspecified type resolved  3. Urgency of micturition DC Vesicare.

## 2016-10-12 ENCOUNTER — Other Ambulatory Visit: Payer: Self-pay | Admitting: Internal Medicine

## 2016-10-14 ENCOUNTER — Telehealth: Payer: Self-pay

## 2016-10-14 NOTE — Telephone Encounter (Signed)
Called patient, left message on voice mail that Dr. Nyoka Cowden said that she could reduce her Metoprolol one daily. If any questions she could call back.

## 2016-10-14 NOTE — Telephone Encounter (Signed)
Patient called c/o fatigue, no energy, and low b/p x 1 week. Patient had to cancel an appointment today due to no energy. Patient would like to know if her Metoprolol can be decreased. Patient is currently taking Metoprolol 25 mg 1 1/2 tablet daily.   Please advise

## 2016-10-14 NOTE — Telephone Encounter (Signed)
Reduce metoprolol to 25 mg daily (1 tablet).

## 2016-10-26 NOTE — Progress Notes (Signed)
Cardiology Office Note   Date:  10/30/2016   ID:  Heather Olsen, Heather Olsen [birth year], MRN [id number]  PCP:  Jeanmarie Hubert, MD  Cardiologist:   Minus Breeding, MD   Chief Complaint  Patient presents with  . Coronary Artery Disease      History of Present Illness: Heather Olsen is a 82 y.o. female who presents for follow up of CAD. She had stent placement in 2008 and was followed by [facility]  She had an LAD stent placed in 2008 which was a 3.8 mm Vision. She had follow-up cath in 2009 with 30%  narrowing of the stent but otherwise no disease. Stress perfusion study in 2014 was negative. She does have some tricuspid regurgitation and some mild mitral regurgitation and I was able to get the report of the [facility] echo since the last appt.   She reports that since that time she has had some episodes of chest pain and her heart rate increasing.  She recalls this happening when she's standing she doesn't know she's had any while she is seated or lying down. She'll feel her heart "revving up". She'll then get some discomfort. Last for a few minutes. The discomfort as a tightness. This comes and goes spontaneously. She might have to sit down and let things improved. This started after she had some sniffling and sneezing and now probable head cold and she felt worn out. She also had her dose of beta blocker reduced because she felt tired. She's not describing any orthostatic symptoms. She's not describing any neck or arm discomfort. She doesn't think this was similar to her previous angina. She has no resting shortness of breath, PND or orthopnea. Although she does have a cough when she lies flat on her left side.  Past Medical History:  Diagnosis Date  . Aortic stenosis 04/12/2016  . Back pain 04/12/2016  . Balance disorder 04/12/2016  . Beat, premature ventricular 01/08/2016  . Coronary artery disease   . Depression   . Diverticulosis   . Hearing loss 04/12/2016  . Hiatal hernia 04/12/2016    . HLD (hyperlipidemia) 04/12/2016  . Hyperglycemia 09/14/2016  . Hypertension   . Idiopathic scoliosis 04/12/2016  . Major depression 04/12/2016  . Mitral regurgitation   . Osteoporosis, senile 04/12/2016  . Pernicious anemia   . Premature atrial contraction   . Raynaud's disease 04/12/2016  . Scoliosis   . Spinal stenosis of lumbar region 04/12/2016  . Tricuspid valve prolapse     Past Surgical History:  Procedure Laterality Date  . APPENDECTOMY    . BREAST SURGERY    . broken wrist Right 2007  . CATARACT EXTRACTION W/ INTRAOCULAR LENS  IMPLANT, BILATERAL Bilateral 2017  . COLONOSCOPY  2016  . FOOT SURGERY Right   . TONSILLECTOMY       Current Outpatient Prescriptions  Medication Sig Dispense Refill  . acetaminophen (TYLENOL) 500 MG tablet Take 650 mg by mouth. Take 2 tablets daily    . aspirin 81 MG chewable tablet Chew 81 mg by mouth. Take one daily    . celecoxib (CELEBREX) 200 MG capsule TAKE 1 CAPSULE BY MOUTH EVERY DAY 30 capsule 0  . Cholecalciferol (VITAMIN D3) 2000 units capsule Take by mouth. Take one daily    . clonazePAM (KLONOPIN) 0.5 MG tablet TAKE 1 TO 2 TABLETS BY MOUTH EVERY NIGHT AT BEDTIME FOR RESTLESS LEGS 60 tablet 0  . DULoxetine (CYMBALTA) 60 MG capsule TAKE 1 CAPSULE BY MOUTH DAILY  FOR DEPRESSION 30 capsule 6  . esomeprazole (NEXIUM) 40 MG capsule TAKE ONE CAPSULE BY MOUTH DAILY FOR STOMACH 30 capsule 2  . ipratropium (ATROVENT) 0.06 % nasal spray Use 2 sprays in each nostril up to three times daily before meals for sinus drainage. 30 mL 3  . Lactobacillus-Inulin ([place] PO) Take by mouth. Take one daily for probiotic    . metoprolol succinate (TOPROL-XL) 25 MG 24 hr tablet TAKE 1& 1/2 TABLETS BY MOUTH DAILY 45 tablet 6  . MULTIPLE VITAMINS-MINERALS PO Take by mouth. Take 1/2 tablet daily    . nitroGLYCERIN (NITROSTAT) 0.4 MG SL tablet Place under the tongue. Place one tablet under the tongue every 5 minutes as needed for chest pain.  No more than 3    . Riboflavin-Magnesium-Feverfew 200-180-50 MG TABS Take by mouth. Take one tablet every evening    . traMADol (ULTRAM) 50 MG tablet TAKE 1 TABLET BY MOUTH EVERY 4 HOURS 75 tablet 0   No current facility-administered medications for this visit.     Allergies:   Sulfa antibiotics; Topiramate; Verapamil; Zolpidem; Prevacid [lansoprazole]; Ace inhibitors; Codeine; and Ibandronic acid    ROS:  Please see the history of present illness.   Otherwise, review of systems are positive for none.   All other systems are reviewed and negative.    PHYSICAL EXAM: VS:  BP 134/80   Pulse 69   Ht 5\' 4"  (1.626 m)   Wt 120 lb (54.4 kg)   BMI 20.60 kg/m  , BMI Body mass index is 20.6 kg/m. GENERAL:  Well appearing, somewhat thin and frail HEENT:  Pupils equal round and reactive, fundi not visualized, oral mucosa unremarkable NECK:  No jugular venous distention, waveform within normal limits, carotid upstroke brisk and symmetric, no bruits, no thyromegaly LYMPHATICS:  No cervical, inguinal adenopathy LUNGS:  Clear to auscultation bilaterally BACK:  No CVA tenderness. Lordosis CHEST:  Unremarkable HEART:  PMI not displaced or sustained,S1 and S2 within normal limits, no S3, no S4, no clicks, no rubs, 2 out of 6 mid left sternal border systolic murmur nonradiating, no diastolic murmurs ABD:  Flat, positive bowel sounds normal in frequency in pitch, no bruits, no rebound, no guarding, no midline pulsatile mass, no hepatomegaly, no splenomegaly EXT:  2 plus pulses throughout, no edema, no cyanosis no clubbing, dependent rubor SKIN:  No rashes no nodules NEURO:  Cranial nerves II through XII grossly intact, motor grossly intact throughout PSYCH:  Cognitively intact, oriented to person place and time   EKG:  EKG is  ordered today. The ekg ordered today demonstrates sinus bradycardia, rate 54, left axis deviation, left anterior fascicular block, RSR prime V1 and V2, poor anterior R wave  progression, first-degree AV block, no acute ST-T wave changes.  PACs   Recent Labs: 09/14/2016: ALT 18; BUN 26; Creat 0.91; Hemoglobin 12.3; Platelets 388; Potassium 5.4; Sodium 135    Lipid Panel    Component Value Date/Time   CHOL 232 (H) 09/14/2016 1415   TRIG 168 (H) 09/14/2016 1415   HDL 99 09/14/2016 1415   CHOLHDL 2.3 09/14/2016 1415   VLDL 34 (H) 09/14/2016 1415   LDLCALC 99 09/14/2016 1415      Wt Readings from Last 3 Encounters:  10/27/16 120 lb (54.4 kg)  10/11/16 118 lb (53.5 kg)  09/14/16 121 lb (54.9 kg)      Other studies Reviewed: Additional studies/ records that were reviewed today include: None Review of the above records demonstrates:  ASSESSMENT AND PLAN:   CAD:  The patient has some chest tightness.  Given this I will order a stress test.  I don't think that she would walk on a treadmill.  She will have a [employer].  PALPITATIONS:   These seem to be improved.  If these continue I will need to place an event monitor.    AORTIC STENOSIS:    She has mild stenosis with some mild mitral regurgitation and tricuspid regurgitation. These will be followed clinically. No further therapy or further imaging is indicated.  HTN: Her blood pressures have been well controlled recently. No change in therapy is indicated.  DYSLIPIDEMIA:  She says she controls this with diet though I don't have recent readings. She does not want to take a statin.     Current medicines are reviewed at length with the patient today.  The patient does not have  concerns regarding medicines.  The following changes have been made:  None  Labs/ tests ordered today include:    Orders Placed This Encounter  Procedures  . Myocardial Perfusion Imaging     Disposition:   FU with me in 3 months.     Signed, Minus Breeding, MD  10/30/2016 10:17 AM    [facility]

## 2016-10-27 ENCOUNTER — Ambulatory Visit (INDEPENDENT_AMBULATORY_CARE_PROVIDER_SITE_OTHER): Payer: Medicare Other | Admitting: Cardiology

## 2016-10-27 ENCOUNTER — Encounter: Payer: Self-pay | Admitting: Cardiology

## 2016-10-27 VITALS — BP 134/80 | HR 69 | Ht 64.0 in | Wt 120.0 lb

## 2016-10-27 DIAGNOSIS — I209 Angina pectoris, unspecified: Secondary | ICD-10-CM

## 2016-10-27 DIAGNOSIS — I25119 Atherosclerotic heart disease of native coronary artery with unspecified angina pectoris: Secondary | ICD-10-CM

## 2016-10-27 DIAGNOSIS — I2510 Atherosclerotic heart disease of native coronary artery without angina pectoris: Secondary | ICD-10-CM

## 2016-10-27 DIAGNOSIS — R079 Chest pain, unspecified: Secondary | ICD-10-CM | POA: Diagnosis not present

## 2016-10-27 NOTE — Patient Instructions (Addendum)
  Medication Instructions:  Continue current medications  Labwork: None Ordered  Testing/Procedures: Your physician has requested that you have a lexiscan myoview. For further information please visit [email or web address]. Please follow instruction sheet, as given.   Follow-Up: Your physician recommends that you schedule a follow-up appointment in: 3 Months   Any Other Special Instructions Will Be Listed Below (If Applicable).     If you need a refill on your cardiac medications before your next appointment, please call your pharmacy.

## 2016-10-30 ENCOUNTER — Encounter: Payer: Self-pay | Admitting: Cardiology

## 2016-11-08 ENCOUNTER — Telehealth (HOSPITAL_COMMUNITY): Payer: Self-pay

## 2016-11-08 NOTE — Telephone Encounter (Signed)
Encounter complete. 

## 2016-11-10 ENCOUNTER — Ambulatory Visit (HOSPITAL_COMMUNITY)
Admission: RE | Admit: 2016-11-10 | Discharge: 2016-11-10 | Disposition: A | Discharge status: Home / Self Care | Payer: Medicare Other | Source: Ambulatory Visit | Attending: Cardiology | Admitting: Cardiology

## 2016-11-10 DIAGNOSIS — R002 Palpitations: Secondary | ICD-10-CM | POA: Insufficient documentation

## 2016-11-10 DIAGNOSIS — I10 Essential (primary) hypertension: Secondary | ICD-10-CM | POA: Insufficient documentation

## 2016-11-10 DIAGNOSIS — I2510 Atherosclerotic heart disease of native coronary artery without angina pectoris: Secondary | ICD-10-CM | POA: Diagnosis not present

## 2016-11-10 DIAGNOSIS — R079 Chest pain, unspecified: Secondary | ICD-10-CM | POA: Diagnosis not present

## 2016-11-10 DIAGNOSIS — R5383 Other fatigue: Secondary | ICD-10-CM | POA: Insufficient documentation

## 2016-11-10 LAB — MYOCARDIAL PERFUSION IMAGING
CHL CUP RESTING HR STRESS: 53 {beats}/min
NUC STRESS TID: 1.08
Peak HR: 75 {beats}/min
SDS: 8
SRS: 3
SSS: 11

## 2016-11-10 MED ORDER — AMINOPHYLLINE 25 MG/ML IV SOLN
75.0000 mg | Freq: Once | INTRAVENOUS | Status: AC
  Administered 2016-11-10: 75 mg via INTRAVENOUS

## 2016-11-10 MED ORDER — TECHNETIUM TC 99M TETROFOSMIN IV KIT
10.5000 | PACK | Freq: Once | INTRAVENOUS | Status: AC | PRN
  Administered 2016-11-10: 10.5 via INTRAVENOUS
  Filled 2016-11-10: qty 11

## 2016-11-10 MED ORDER — TECHNETIUM TC 99M TETROFOSMIN IV KIT
30.6000 | PACK | Freq: Once | INTRAVENOUS | Status: AC | PRN
Start: 2016-11-10 — End: 2016-11-10
  Administered 2016-11-10: 30.6 via INTRAVENOUS
  Filled 2016-11-10: qty 31

## 2016-11-10 MED ORDER — REGADENOSON 0.4 MG/5ML IV SOLN
0.4000 mg | Freq: Once | INTRAVENOUS | Status: AC
  Administered 2016-11-10: 0.4 mg via INTRAVENOUS

## 2016-11-11 ENCOUNTER — Other Ambulatory Visit: Payer: Self-pay | Admitting: Internal Medicine

## 2016-11-13 ENCOUNTER — Other Ambulatory Visit: Payer: Self-pay | Admitting: Nurse Practitioner

## 2016-11-22 ENCOUNTER — Telehealth: Payer: Self-pay | Admitting: Cardiology

## 2016-11-22 NOTE — Telephone Encounter (Signed)
Pt c/o swelling: STAT is pt has developed SOB within 24 hours  1. How long have you been experiencing swelling? week  2. Where is the swelling located? Both legs: under knees, ankles and feet  3.  Are you currently taking a "fluid pill"? no  4.  Are you currently SOB? no  5.  Have you traveled recently?  no

## 2016-11-22 NOTE — Telephone Encounter (Signed)
Returned call to patient- patient reports increased swelling in feet and ankles x 1 week.  Denies SOB, CP, orthopnea, or SOB when lying flat.  Reports she weighed yesterday and her weight was increased by 3-4 lbs but reports she doesn't weigh daily.  Reports swelling decreases overnight and when she sits to elevate her ankles but as the swelling comes back quickly as soon as she is up and moving again.  Reports she use to take HCTZ but currently does not take any diuretics.  Reports ankles and feet are so swollen she had trouble getting her shoes on yesterday.  Denies recent travel.  Reports she may have ate too much salt during the holidays.    Advised to limit salt intake and continue to elevate LE. Advised I would route to MD for further recommendations. Pt verbalized understanding.

## 2016-11-23 MED ORDER — FUROSEMIDE 20 MG PO TABS: 20.0000 mg | ORAL_TABLET | ORAL | 3 refills | Status: DC | PRN

## 2016-11-23 NOTE — Telephone Encounter (Signed)
She can take Lasix 20 mg daily for the next three days then then stop.  Give Lasix 20 mg po prn disp number 30 with 3 refills.

## 2016-11-23 NOTE — Telephone Encounter (Signed)
Called patient and made aware of MD recommendations:  Minus Breeding, MD   10:39 AM  Note    She can take Lasix 20 mg daily for the next three days then then stop.  Give Lasix 20 mg po prn disp number 30 with 3 refills.         Rx sent electronically to pharmacy.  Advised to continue to monitor daily weights and call with further questions/concerns.  Pt agreed and verbalized understanding.

## 2016-11-28 ENCOUNTER — Other Ambulatory Visit: Payer: Self-pay | Admitting: Internal Medicine

## 2016-12-08 DIAGNOSIS — R7303 Prediabetes: Secondary | ICD-10-CM | POA: Diagnosis not present

## 2016-12-08 DIAGNOSIS — N390 Urinary tract infection, site not specified: Secondary | ICD-10-CM | POA: Diagnosis not present

## 2016-12-08 DIAGNOSIS — I7300 Raynaud's syndrome without gangrene: Secondary | ICD-10-CM | POA: Diagnosis not present

## 2016-12-08 DIAGNOSIS — E782 Mixed hyperlipidemia: Secondary | ICD-10-CM | POA: Diagnosis not present

## 2016-12-08 DIAGNOSIS — R600 Localized edema: Secondary | ICD-10-CM | POA: Diagnosis not present

## 2016-12-08 DIAGNOSIS — M859 Disorder of bone density and structure, unspecified: Secondary | ICD-10-CM | POA: Diagnosis not present

## 2016-12-08 DIAGNOSIS — I493 Ventricular premature depolarization: Secondary | ICD-10-CM | POA: Diagnosis not present

## 2016-12-08 DIAGNOSIS — K449 Diaphragmatic hernia without obstruction or gangrene: Secondary | ICD-10-CM | POA: Diagnosis not present

## 2016-12-08 DIAGNOSIS — I10 Essential (primary) hypertension: Secondary | ICD-10-CM | POA: Diagnosis not present

## 2016-12-08 DIAGNOSIS — M8580 Other specified disorders of bone density and structure, unspecified site: Secondary | ICD-10-CM | POA: Diagnosis not present

## 2016-12-16 ENCOUNTER — Other Ambulatory Visit: Payer: Self-pay | Admitting: Internal Medicine

## 2016-12-20 DIAGNOSIS — M810 Age-related osteoporosis without current pathological fracture: Secondary | ICD-10-CM | POA: Diagnosis not present

## 2016-12-20 DIAGNOSIS — E119 Type 2 diabetes mellitus without complications: Secondary | ICD-10-CM | POA: Diagnosis not present

## 2016-12-20 DIAGNOSIS — R600 Localized edema: Secondary | ICD-10-CM | POA: Diagnosis not present

## 2017-01-03 DIAGNOSIS — I7300 Raynaud's syndrome without gangrene: Secondary | ICD-10-CM | POA: Diagnosis not present

## 2017-01-03 DIAGNOSIS — G5603 Carpal tunnel syndrome, bilateral upper limbs: Secondary | ICD-10-CM | POA: Diagnosis not present

## 2017-01-09 DIAGNOSIS — M47812 Spondylosis without myelopathy or radiculopathy, cervical region: Secondary | ICD-10-CM | POA: Diagnosis not present

## 2017-01-09 DIAGNOSIS — M180 Bilateral primary osteoarthritis of first carpometacarpal joints: Secondary | ICD-10-CM | POA: Diagnosis not present

## 2017-01-09 DIAGNOSIS — G5603 Carpal tunnel syndrome, bilateral upper limbs: Secondary | ICD-10-CM | POA: Diagnosis not present

## 2017-01-09 DIAGNOSIS — M79642 Pain in left hand: Secondary | ICD-10-CM | POA: Diagnosis not present

## 2017-01-09 DIAGNOSIS — M79641 Pain in right hand: Secondary | ICD-10-CM | POA: Diagnosis not present

## 2017-01-11 DIAGNOSIS — M180 Bilateral primary osteoarthritis of first carpometacarpal joints: Secondary | ICD-10-CM | POA: Diagnosis not present

## 2017-01-11 DIAGNOSIS — G5603 Carpal tunnel syndrome, bilateral upper limbs: Secondary | ICD-10-CM | POA: Diagnosis not present

## 2017-01-11 DIAGNOSIS — M47812 Spondylosis without myelopathy or radiculopathy, cervical region: Secondary | ICD-10-CM | POA: Diagnosis not present

## 2017-01-11 DIAGNOSIS — M79642 Pain in left hand: Secondary | ICD-10-CM | POA: Diagnosis not present

## 2017-01-11 DIAGNOSIS — M79641 Pain in right hand: Secondary | ICD-10-CM | POA: Diagnosis not present

## 2017-01-19 DIAGNOSIS — I7300 Raynaud's syndrome without gangrene: Secondary | ICD-10-CM | POA: Diagnosis not present

## 2017-01-19 DIAGNOSIS — M2550 Pain in unspecified joint: Secondary | ICD-10-CM | POA: Diagnosis not present

## 2017-01-19 DIAGNOSIS — M79643 Pain in unspecified hand: Secondary | ICD-10-CM | POA: Diagnosis not present

## 2017-01-19 DIAGNOSIS — R202 Paresthesia of skin: Secondary | ICD-10-CM | POA: Diagnosis not present

## 2017-01-20 DIAGNOSIS — R600 Localized edema: Secondary | ICD-10-CM | POA: Diagnosis not present

## 2017-01-25 ENCOUNTER — Other Ambulatory Visit: Payer: Self-pay | Admitting: Nurse Practitioner

## 2017-01-29 NOTE — Progress Notes (Signed)
Cardiology Office Note   Date:  01/31/2017   ID:  Heather Olsen, Heather Olsen 1931-02-06, MRN [id number]  PCP:  Horatio Pel, MD  Cardiologist:   Minus Breeding, MD   Chief Complaint  Patient presents with  . Leg Swelling      History of Present Illness: Heather Olsen is a 83 y.o. female who presents for follow up of CAD. She had stent placement in 2008 and was followed by [facility].  She had an LAD stent placed in 2008 which was a 3.8 mm Vision. She had follow-up cath in 2009 with 30%  narrowing of the stent but otherwise no disease. Stress perfusion study in 2014 was negative. She does have some tricuspid regurgitation and some mild mitral regurgitation and I was able to get the report of the [facility] echo.  At the last visit she had some chest pain but follow up Lexiscan Myoview was negative.  She called after that appt with increased leg swelling thought to be related to salt increase but she did not need to take Lasix.  Since then she has done very well.  The patient denies any new symptoms such as chest discomfort, neck or arm discomfort. There has been no new shortness of breath, PND or orthopnea. There have been no reported palpitations, presyncope or syncope.  She thins that she has more energy.    Past Medical History:  Diagnosis Date  . Aortic stenosis 04/12/2016  . Back pain 04/12/2016  . Balance disorder 04/12/2016  . Beat, premature ventricular 01/08/2016  . Coronary artery disease   . Depression   . Diverticulosis   . Hearing loss 04/12/2016  . Hiatal hernia 04/12/2016  . HLD (hyperlipidemia) 04/12/2016  . Hyperglycemia 09/14/2016  . Hypertension   . Idiopathic scoliosis 04/12/2016  . Major depression 04/12/2016  . Mitral regurgitation   . Osteoporosis, senile 04/12/2016  . Pernicious anemia   . Premature atrial contraction   . Raynaud's disease 04/12/2016  . Scoliosis   . Spinal stenosis of lumbar region 04/12/2016  . Tricuspid valve prolapse     Past  Surgical History:  Procedure Laterality Date  . APPENDECTOMY    . BREAST SURGERY    . broken wrist Right 2007  . CATARACT EXTRACTION W/ INTRAOCULAR LENS  IMPLANT, BILATERAL Bilateral 2017  . COLONOSCOPY  2016  . FOOT SURGERY Right   . TONSILLECTOMY       Current Outpatient Prescriptions  Medication Sig Dispense Refill  . acetaminophen (TYLENOL) 500 MG tablet Take 650 mg by mouth. Take 2 tablets daily    . aspirin 81 MG chewable tablet Chew 81 mg by mouth. Take one daily    . celecoxib (CELEBREX) 200 MG capsule TAKE 1 CAPSULE BY MOUTH EVERY DAY 30 capsule 0  . Cholecalciferol (VITAMIN D3) 2000 units capsule Take by mouth. Take one daily    . clonazePAM (KLONOPIN) 0.5 MG tablet Pt take 3/4 mg by mouth every night at bedtime for restless leg    . diclofenac sodium (VOLTAREN) 1 % GEL Apply 1 application topically daily.    . DULoxetine (CYMBALTA) 60 MG capsule Take 1 capsule by mouth daily.    [name] esomeprazole (NEXIUM) 40 MG capsule TAKE ONE CAPSULE BY MOUTH DAILY FOR STOMACH 30 capsule 2  . furosemide (LASIX) 20 MG tablet Take 1 tablet (20 mg total) by mouth as needed. 30 tablet 3  . hydrochlorothiazide (HYDRODIURIL) 12.5 MG tablet Take 1 tablet by mouth as needed.    [name]  ipratropium (ATROVENT) 0.06 % nasal spray Use 2 sprays in each nostril up to three times daily before meals for sinus drainage. 30 mL 3  . Lactobacillus-Inulin ([place] PO) Take by mouth. Take one daily for probiotic    . metoprolol succinate (TOPROL-XL) 25 MG 24 hr tablet Take 25 mg by mouth daily.    . MULTIPLE VITAMINS-MINERALS PO Take 1 tablet by mouth daily.     . nitroGLYCERIN (NITROSTAT) 0.4 MG SL tablet Place under the tongue. Place one tablet under the tongue every 5 minutes as needed for chest pain. No more than 3    . Riboflavin-Magnesium-Feverfew 200-180-50 MG TABS Take by mouth. Take one tablet every evening    . traMADol (ULTRAM) 50 MG tablet TAKE 1 TABLET BY MOUTH EVERY 4 HOURS 75 tablet 0     No current facility-administered medications for this visit.     Allergies:   Sulfa antibiotics; Topiramate; Verapamil; Zolpidem; Prevacid [lansoprazole]; Ace inhibitors; Codeine; and Ibandronic acid    ROS:  Please see the history of present illness.   Otherwise, review of systems are positive for non.   All other systems are reviewed and negative.    PHYSICAL EXAM: VS:  BP 126/84   Pulse (!) 52   Ht 5\' 4"  (1.626 m)   Wt 118 lb 9.6 oz (53.8 kg)   BMI 20.36 kg/m  , BMI Body mass index is 20.36 kg/m. GENERAL:  Well appearing, somewhat thin and frail NECK:  No jugular venous distention, waveform within normal limits, carotid upstroke brisk and symmetric, no bruits, no thyromegaly LUNGS:  Clear to auscultation bilaterally BACK:  No CVA tenderness. Lordosis CHEST:  Unremarkable HEART:  PMI not displaced or sustained,S1 and S2 within normal limits, no S3, no S4, no clicks, no rubs, 2 out of 6 mid left sternal border systolic murmur nonradiating, no diastolic murmurs ABD:  Flat, positive bowel sounds normal in frequency in pitch, no bruits, no rebound, no guarding, no midline pulsatile mass, no hepatomegaly, no splenomegaly EXT:  2 plus pulses throughout, no edema, no cyanosis no clubbing, dependent rubor   EKG:  EKG is 52 ordered today. The ekg ordered today demonstrates sinus bradycardia, rate 52, left axis deviation, left anterior fascicular block, RSR prime V1 and V2, poor anterior R wave progression, first-degree AV block, no acute ST-T wave changes.  PVCs   Recent Labs: 09/14/2016: ALT 18; BUN 26; Creat 0.91; Hemoglobin 12.3; Platelets 388; Potassium 5.4; Sodium 135    Lipid Panel    Component Value Date/Time   CHOL 232 (H) 09/14/2016 1415   TRIG 168 (H) 09/14/2016 1415   HDL 99 09/14/2016 1415   CHOLHDL 2.3 09/14/2016 1415   VLDL 34 (H) 09/14/2016 1415   LDLCALC 99 09/14/2016 1415      Wt Readings from Last 3 Encounters:  01/30/17 118 lb 9.6 oz (53.8 kg)   11/10/16 120 lb (54.4 kg)  10/27/16 120 lb (54.4 kg)      Other studies Reviewed: Additional studies/ records that were reviewed today include: None Review of the above records demonstrates:    ASSESSMENT AND PLAN:  SWELLING :  This has not been a problem.  No change in therapy is planned.   CAD:  She had a negative Lexiscan Myoview in December of last year.  No further testing is indicated.   PALPITATIONS:   These seem to be improved.  If these continue I will need to place an event monitor.    AORTIC  STENOSIS:    She has mild stenosis with some mild mitral regurgitation and tricuspid regurgitation. These will be followed clinically. No further therapy or further imaging is indicated.    HTN: Her blood pressures have been well controlled recently. No change in therapy is indicated.    DYSLIPIDEMIA:  She says she controls this with diet though I don't have recent readings. She does not want to take a statin.     Current medicines are reviewed at length with the patient today.  The patient does not have  concerns regarding medicines.  The following changes have been made:  None  Labs/ tests ordered today include:   None  Orders Placed This Encounter  Procedures  . EKG 12-Lead     Disposition:   FU with me in 12 months.     Signed, Minus Breeding, MD  01/31/2017 12:39 PM    [facility]

## 2017-01-30 ENCOUNTER — Ambulatory Visit (INDEPENDENT_AMBULATORY_CARE_PROVIDER_SITE_OTHER): Payer: Medicare Other | Admitting: Cardiology

## 2017-01-30 VITALS — BP 126/84 | HR 52 | Ht 64.0 in | Wt 118.6 lb

## 2017-01-30 DIAGNOSIS — I209 Angina pectoris, unspecified: Secondary | ICD-10-CM | POA: Diagnosis not present

## 2017-01-30 DIAGNOSIS — I25119 Atherosclerotic heart disease of native coronary artery with unspecified angina pectoris: Secondary | ICD-10-CM

## 2017-01-30 DIAGNOSIS — M7989 Other specified soft tissue disorders: Secondary | ICD-10-CM | POA: Diagnosis not present

## 2017-01-30 DIAGNOSIS — I10 Essential (primary) hypertension: Secondary | ICD-10-CM | POA: Diagnosis not present

## 2017-01-30 NOTE — Patient Instructions (Signed)

## 2017-01-31 ENCOUNTER — Encounter: Payer: Self-pay | Admitting: Cardiology

## 2017-01-31 DIAGNOSIS — M79643 Pain in unspecified hand: Secondary | ICD-10-CM | POA: Diagnosis not present

## 2017-01-31 DIAGNOSIS — M1990 Unspecified osteoarthritis, unspecified site: Secondary | ICD-10-CM | POA: Diagnosis not present

## 2017-01-31 DIAGNOSIS — I7300 Raynaud's syndrome without gangrene: Secondary | ICD-10-CM | POA: Diagnosis not present

## 2017-01-31 DIAGNOSIS — M2550 Pain in unspecified joint: Secondary | ICD-10-CM | POA: Diagnosis not present

## 2017-02-06 ENCOUNTER — Other Ambulatory Visit: Payer: Medicare Other

## 2017-02-08 DIAGNOSIS — M79641 Pain in right hand: Secondary | ICD-10-CM | POA: Diagnosis not present

## 2017-02-08 DIAGNOSIS — M79642 Pain in left hand: Secondary | ICD-10-CM | POA: Diagnosis not present

## 2017-02-08 DIAGNOSIS — G5603 Carpal tunnel syndrome, bilateral upper limbs: Secondary | ICD-10-CM | POA: Diagnosis not present

## 2017-02-14 ENCOUNTER — Encounter: Payer: Self-pay | Admitting: Internal Medicine

## 2017-02-14 DIAGNOSIS — M47816 Spondylosis without myelopathy or radiculopathy, lumbar region: Secondary | ICD-10-CM | POA: Diagnosis not present

## 2017-02-14 DIAGNOSIS — M50322 Other cervical disc degeneration at C5-C6 level: Secondary | ICD-10-CM | POA: Diagnosis not present

## 2017-02-14 DIAGNOSIS — M4802 Spinal stenosis, cervical region: Secondary | ICD-10-CM | POA: Diagnosis not present

## 2017-03-06 DIAGNOSIS — M419 Scoliosis, unspecified: Secondary | ICD-10-CM | POA: Diagnosis not present

## 2017-03-06 DIAGNOSIS — Z79899 Other long term (current) drug therapy: Secondary | ICD-10-CM | POA: Diagnosis not present

## 2017-03-06 DIAGNOSIS — D519 Vitamin B12 deficiency anemia, unspecified: Secondary | ICD-10-CM | POA: Diagnosis not present

## 2017-03-15 DIAGNOSIS — M47812 Spondylosis without myelopathy or radiculopathy, cervical region: Secondary | ICD-10-CM | POA: Diagnosis not present

## 2017-03-15 DIAGNOSIS — M40209 Unspecified kyphosis, site unspecified: Secondary | ICD-10-CM | POA: Diagnosis not present

## 2017-03-15 DIAGNOSIS — G5603 Carpal tunnel syndrome, bilateral upper limbs: Secondary | ICD-10-CM | POA: Diagnosis not present

## 2017-03-24 DIAGNOSIS — E119 Type 2 diabetes mellitus without complications: Secondary | ICD-10-CM | POA: Diagnosis not present

## 2017-03-27 DIAGNOSIS — G5603 Carpal tunnel syndrome, bilateral upper limbs: Secondary | ICD-10-CM | POA: Diagnosis not present

## 2017-03-28 ENCOUNTER — Other Ambulatory Visit: Payer: Self-pay | Admitting: Orthopedic Surgery

## 2017-03-28 ENCOUNTER — Encounter (HOSPITAL_BASED_OUTPATIENT_CLINIC_OR_DEPARTMENT_OTHER): Payer: Self-pay | Admitting: *Deleted

## 2017-03-28 DIAGNOSIS — E119 Type 2 diabetes mellitus without complications: Secondary | ICD-10-CM | POA: Diagnosis not present

## 2017-03-28 NOTE — Progress Notes (Signed)
Chart and cardiology notes reviewed by Dr. E.Fitzgerald. [place] for carpal tunnel surgery opt 04/04/17 with Dr. Fredna Dow.

## 2017-04-03 ENCOUNTER — Encounter (HOSPITAL_BASED_OUTPATIENT_CLINIC_OR_DEPARTMENT_OTHER): Payer: Self-pay | Admitting: Anesthesiology

## 2017-04-04 ENCOUNTER — Encounter (HOSPITAL_BASED_OUTPATIENT_CLINIC_OR_DEPARTMENT_OTHER): Payer: Self-pay | Admitting: *Deleted

## 2017-04-04 ENCOUNTER — Ambulatory Visit (HOSPITAL_BASED_OUTPATIENT_CLINIC_OR_DEPARTMENT_OTHER): Payer: Medicare Other | Admitting: Anesthesiology

## 2017-04-04 ENCOUNTER — Ambulatory Visit (HOSPITAL_BASED_OUTPATIENT_CLINIC_OR_DEPARTMENT_OTHER)
Admission: RE | Admit: 2017-04-04 | Discharge: 2017-04-04 | Disposition: A | Discharge status: Home / Self Care | Payer: Medicare Other | Source: Ambulatory Visit | Attending: Orthopedic Surgery | Admitting: Orthopedic Surgery

## 2017-04-04 ENCOUNTER — Encounter (HOSPITAL_BASED_OUTPATIENT_CLINIC_OR_DEPARTMENT_OTHER)
Admission: RE | Disposition: A | Discharge status: Home / Self Care | Payer: Self-pay | Source: Ambulatory Visit | Attending: Orthopedic Surgery

## 2017-04-04 DIAGNOSIS — Z885 Allergy status to narcotic agent status: Secondary | ICD-10-CM | POA: Insufficient documentation

## 2017-04-04 DIAGNOSIS — Z79899 Other long term (current) drug therapy: Secondary | ICD-10-CM | POA: Diagnosis not present

## 2017-04-04 DIAGNOSIS — F329 Major depressive disorder, single episode, unspecified: Secondary | ICD-10-CM | POA: Insufficient documentation

## 2017-04-04 DIAGNOSIS — Z882 Allergy status to sulfonamides status: Secondary | ICD-10-CM | POA: Insufficient documentation

## 2017-04-04 DIAGNOSIS — Z888 Allergy status to other drugs, medicaments and biological substances status: Secondary | ICD-10-CM | POA: Diagnosis not present

## 2017-04-04 DIAGNOSIS — E785 Hyperlipidemia, unspecified: Secondary | ICD-10-CM | POA: Diagnosis not present

## 2017-04-04 DIAGNOSIS — I2510 Atherosclerotic heart disease of native coronary artery without angina pectoris: Secondary | ICD-10-CM | POA: Diagnosis not present

## 2017-04-04 DIAGNOSIS — G5603 Carpal tunnel syndrome, bilateral upper limbs: Secondary | ICD-10-CM | POA: Insufficient documentation

## 2017-04-04 DIAGNOSIS — G5602 Carpal tunnel syndrome, left upper limb: Secondary | ICD-10-CM | POA: Diagnosis not present

## 2017-04-04 DIAGNOSIS — I10 Essential (primary) hypertension: Secondary | ICD-10-CM | POA: Insufficient documentation

## 2017-04-04 DIAGNOSIS — Z7982 Long term (current) use of aspirin: Secondary | ICD-10-CM | POA: Insufficient documentation

## 2017-04-04 HISTORY — PX: CARPAL TUNNEL RELEASE: SHX101

## 2017-04-04 LAB — POCT I-STAT, CHEM 8
BUN: 26 mg/dL — AB (ref 6–20)
CALCIUM ION: 1.28 mmol/L (ref 1.15–1.40)
CHLORIDE: 101 mmol/L (ref 101–111)
CREATININE: 0.8 mg/dL (ref 0.44–1.00)
Glucose, Bld: 123 mg/dL — ABNORMAL HIGH (ref 65–99)
HCT: 40 % (ref 36.0–46.0)
Hemoglobin: 13.6 g/dL (ref 12.0–15.0)
Potassium: 4.6 mmol/L (ref 3.5–5.1)
SODIUM: 138 mmol/L (ref 135–145)
TCO2: 28 mmol/L (ref 0–100)

## 2017-04-04 SURGERY — CARPAL TUNNEL RELEASE
Anesthesia: Regional | Site: Wrist | Laterality: Left

## 2017-04-04 MED ORDER — SCOPOLAMINE 1 MG/3DAYS TD PT72: 1.0000 | MEDICATED_PATCH | Freq: Once | TRANSDERMAL | Status: DC | PRN

## 2017-04-04 MED ORDER — LIDOCAINE HCL (PF) 0.5 % IJ SOLN
INTRAMUSCULAR | Status: DC | PRN
  Administered 2017-04-04: 30 mL via INTRAVENOUS

## 2017-04-04 MED ORDER — MIDAZOLAM HCL 5 MG/5ML IJ SOLN
INTRAMUSCULAR | Status: DC | PRN
  Administered 2017-04-04 (×2): 1 mg via INTRAVENOUS

## 2017-04-04 MED ORDER — OXYCODONE HCL 5 MG/5ML PO SOLN: 5.0000 mg | Freq: Once | ORAL | Status: DC | PRN

## 2017-04-04 MED ORDER — LIDOCAINE 2% (20 MG/ML) 5 ML SYRINGE
INTRAMUSCULAR | Status: AC
  Filled 2017-04-04: qty 5

## 2017-04-04 MED ORDER — FENTANYL CITRATE (PF) 100 MCG/2ML IJ SOLN
INTRAMUSCULAR | Status: AC
  Filled 2017-04-04: qty 2

## 2017-04-04 MED ORDER — CEFAZOLIN SODIUM-DEXTROSE 2-4 GM/100ML-% IV SOLN
2.0000 g | INTRAVENOUS | Status: AC
  Administered 2017-04-04: 2 g via INTRAVENOUS

## 2017-04-04 MED ORDER — BUPIVACAINE HCL (PF) 0.25 % IJ SOLN
INTRAMUSCULAR | Status: DC | PRN
  Administered 2017-04-04: 9 mL

## 2017-04-04 MED ORDER — FENTANYL CITRATE (PF) 100 MCG/2ML IJ SOLN: 50.0000 ug | INTRAMUSCULAR | Status: DC | PRN

## 2017-04-04 MED ORDER — PROPOFOL 10 MG/ML IV BOLUS
INTRAVENOUS | Status: AC
  Filled 2017-04-04: qty 20

## 2017-04-04 MED ORDER — PROMETHAZINE HCL 25 MG/ML IJ SOLN: 6.2500 mg | INTRAMUSCULAR | Status: DC | PRN

## 2017-04-04 MED ORDER — HYDROMORPHONE HCL 1 MG/ML IJ SOLN: 0.2500 mg | INTRAMUSCULAR | Status: DC | PRN

## 2017-04-04 MED ORDER — MIDAZOLAM HCL 2 MG/2ML IJ SOLN
INTRAMUSCULAR | Status: AC
  Filled 2017-04-04: qty 2

## 2017-04-04 MED ORDER — FENTANYL CITRATE (PF) 100 MCG/2ML IJ SOLN
INTRAMUSCULAR | Status: DC | PRN
  Administered 2017-04-04: 25 ug via INTRAVENOUS

## 2017-04-04 MED ORDER — PROPOFOL 10 MG/ML IV BOLUS
INTRAVENOUS | Status: DC | PRN
  Administered 2017-04-04: 10 mg via INTRAVENOUS

## 2017-04-04 MED ORDER — OXYCODONE HCL 5 MG PO TABS: 5.0000 mg | ORAL_TABLET | Freq: Once | ORAL | Status: DC | PRN

## 2017-04-04 MED ORDER — CHLORHEXIDINE GLUCONATE 4 % EX LIQD: 60.0000 mL | Freq: Once | CUTANEOUS | Status: DC

## 2017-04-04 MED ORDER — CEFAZOLIN SODIUM-DEXTROSE 2-4 GM/100ML-% IV SOLN
INTRAVENOUS | Status: AC
  Filled 2017-04-04: qty 100

## 2017-04-04 MED ORDER — LACTATED RINGERS IV SOLN
INTRAVENOUS | Status: DC
  Administered 2017-04-04: 10 mL/h via INTRAVENOUS
  Administered 2017-04-04: 10:00:00 via INTRAVENOUS

## 2017-04-04 MED ORDER — MIDAZOLAM HCL 2 MG/2ML IJ SOLN: 1.0000 mg | INTRAMUSCULAR | Status: DC | PRN

## 2017-04-04 MED ORDER — ONDANSETRON HCL 4 MG/2ML IJ SOLN
INTRAMUSCULAR | Status: AC
  Filled 2017-04-04: qty 2

## 2017-04-04 SURGICAL SUPPLY — 34 items
BLADE SURG 15 STRL LF DISP TIS (BLADE) ×1 IMPLANT
BLADE SURG 15 STRL SS (BLADE) ×2
BNDG COHESIVE 3X5 TAN STRL LF (GAUZE/BANDAGES/DRESSINGS) ×2 IMPLANT
BNDG ESMARK 4X9 LF (GAUZE/BANDAGES/DRESSINGS) IMPLANT
BNDG GAUZE ELAST 4 BULKY (GAUZE/BANDAGES/DRESSINGS) ×2 IMPLANT
CHLORAPREP W/TINT 26ML (MISCELLANEOUS) ×2 IMPLANT
CORDS BIPOLAR (ELECTRODE) ×2 IMPLANT
COVER BACK TABLE 60X90IN (DRAPES) ×2 IMPLANT
COVER MAYO STAND STRL (DRAPES) ×2 IMPLANT
CUFF TOURNIQUET SINGLE 18IN (TOURNIQUET CUFF) ×2 IMPLANT
DRAPE EXTREMITY T 121X128X90 (DRAPE) ×2 IMPLANT
DRAPE SURG 17X23 STRL (DRAPES) ×2 IMPLANT
DRSG PAD ABDOMINAL 8X10 ST (GAUZE/BANDAGES/DRESSINGS) ×2 IMPLANT
GAUZE SPONGE 4X4 12PLY STRL (GAUZE/BANDAGES/DRESSINGS) ×2 IMPLANT
GAUZE XEROFORM 1X8 LF (GAUZE/BANDAGES/DRESSINGS) ×2 IMPLANT
GLOVE BIO SURGEON STRL SZ 6.5 (GLOVE) ×2 IMPLANT
GLOVE BIOGEL PI IND STRL 7.0 (GLOVE) ×2 IMPLANT
GLOVE BIOGEL PI IND STRL 8.5 (GLOVE) ×1 IMPLANT
GLOVE BIOGEL PI INDICATOR 7.0 (GLOVE) ×2
GLOVE BIOGEL PI INDICATOR 8.5 (GLOVE) ×1
GLOVE SURG ORTHO 8.0 STRL STRW (GLOVE) ×2 IMPLANT
GOWN STRL REUS W/ TWL LRG LVL3 (GOWN DISPOSABLE) ×1 IMPLANT
GOWN STRL REUS W/TWL LRG LVL3 (GOWN DISPOSABLE) ×1
GOWN STRL REUS W/TWL XL LVL3 (GOWN DISPOSABLE) ×2 IMPLANT
NEEDLE PRECISIONGLIDE 27X1.5 (NEEDLE) ×2 IMPLANT
NS IRRIG 1000ML POUR BTL (IV SOLUTION) ×2 IMPLANT
PACK BASIN DAY SURGERY FS (CUSTOM PROCEDURE TRAY) ×2 IMPLANT
STOCKINETTE 4X48 STRL (DRAPES) ×2 IMPLANT
SUT ETHILON 4 0 PS 2 18 (SUTURE) ×2 IMPLANT
SUT VICRYL 4-0 PS2 18IN ABS (SUTURE) IMPLANT
SYR BULB 3OZ (MISCELLANEOUS) ×2 IMPLANT
SYR CONTROL 10ML LL (SYRINGE) ×2 IMPLANT
TOWEL OR 17X24 6PK STRL BLUE (TOWEL DISPOSABLE) ×2 IMPLANT
UNDERPAD 30X30 (UNDERPADS AND DIAPERS) ×2 IMPLANT

## 2017-04-04 NOTE — Brief Op Note (Signed)
04/04/2017  11:05 AM  PATIENT:  Heather Olsen  83 y.o. female  PRE-OPERATIVE DIAGNOSIS:  LEFT CARPAL TUNNEL SYNDROME  POST-OPERATIVE DIAGNOSIS:  LEFT CARPAL TUNNEL SYNDROME  PROCEDURE:  Procedure(s) with comments: LEFT CARPAL TUNNEL RELEASE (Left) - REG/FAB  SURGEON:  Surgeon(s) and Role:    Daryll Brod, MD - Primary  PHYSICIAN ASSISTANT:   ASSISTANTS: none   ANESTHESIA:   regional and IV sedation local  EBL:  Total I/O In: 900 [I.V.:900] Out: 2 [Blood:2]  BLOOD ADMINISTERED:none  DRAINS: none   LOCAL MEDICATIONS USED:  BUPIVICAINE   SPECIMEN:  No Specimen  DISPOSITION OF SPECIMEN:  N/A  COUNTS:  YES  TOURNIQUET:   Total Tourniquet Time Documented: Forearm (Left) - 22 minutes Total: Forearm (Left) - 22 minutes   DICTATION: .Other Dictation: Dictation Number [id number]  PLAN OF CARE: Discharge to home after PACU  PATIENT DISPOSITION:  PACU - hemodynamically stable.

## 2017-04-04 NOTE — H&P (Addendum)
Heather Olsen is an 83 y.o. female.   Chief Complaint: numbness left hand HPI: Heather Olsen is an 83 year old right-hand-dominant female referred by Dr. Deland Pretty for consultation regarding numbness and tingling both hands left greater than right. This been going on for at least 3-4 weeks.. She states that she has had symptoms for a year. She does have a history of rainouts. She states that the numbness is tingling in his thumb through ring fingers. She states this does not awaken her at night. She has a history of scoliosis. She has occasional sharp pain with a VAS score 7/10. She cannot take Aleve because of some stomach upset and has not taken anything at all for this. She has had a prior history of a scaphoid fracture on her right side treated by Dr. Wynetta Emery in 2017 with a screw fixation. He has a history of borderline diabetes no history of thyroid problems a history of osteoarthritis no history of gout. Family history is positive diabetes negative for thyroid problems arthritis and gout. States this began after the recent snowstorm. She states that her hands are particularly cold and had become numb and tingly since that time. Nothing seems to make it better or worse. It is not constant.She is was sent to Dr. Thereasa Parkin was done nerve conductions on her. This reveals bilateral carpal tunnel syndrome with evidence of a left upper limb cervical polyradiculopathy with active denervation noted at C8. These reveal no sensory response to either median nerves. She shows a motor delay of 4.7 on her left side and 5.94 on her right. We have discussed possibility of surgical release      Past Medical History:  Diagnosis Date  . Aortic stenosis 04/12/2016  . Back pain 04/12/2016  . Balance disorder 04/12/2016  . Beat, premature ventricular 01/08/2016  . Coronary artery disease   . Depression   . Diverticulosis   . Hearing loss 04/12/2016  . Hiatal hernia 04/12/2016  . HLD (hyperlipidemia) 04/12/2016  .  Hyperglycemia 09/14/2016  . Hypertension   . Idiopathic scoliosis 04/12/2016  . Major depression 04/12/2016  . Mitral regurgitation   . Osteoporosis, senile 04/12/2016  . Pernicious anemia   . Premature atrial contraction   . Raynaud's disease 04/12/2016  . Scoliosis   . Spinal stenosis of lumbar region 04/12/2016  . Tricuspid valve prolapse     Past Surgical History:  Procedure Laterality Date  . APPENDECTOMY    . BREAST SURGERY    . broken wrist Right 2007  . CATARACT EXTRACTION W/ INTRAOCULAR LENS  IMPLANT, BILATERAL Bilateral 2017  . COLONOSCOPY  2016  . FOOT SURGERY Right   . TONSILLECTOMY      Family History  Problem Relation Age of Onset  . Heart disease Mother     Died 92, MR, no CAD  . Heart disease Father     CHF, no CAD..   . HIV Daughter 37    from bone tissue transplant   Social History:  reports that she has never smoked. She has never used smokeless tobacco. She reports that she drinks alcohol. She reports that she does not use drugs.  Allergies:  Allergies  Allergen Reactions  . Sulfa Antibiotics Other (See Comments)    headaches  . Topiramate Rash  . Verapamil Other (See Comments)    constipation  . Zolpidem Other (See Comments)    hallucinations  . Prevacid [Lansoprazole]     Chest pain  . Ace Inhibitors Cough  . Codeine Nausea Only  .  Ibandronic Acid Nausea Only    No prescriptions prior to admission.    No results found for this or any previous visit (from the past 48 hour(s)).  No results found.   Pertinent items are noted in HPI.  Height 5\' 4"  (1.626 m), weight 53.5 kg (118 lb).  General appearance: alert, cooperative and appears stated age Head: Normocephalic, without obvious abnormality Neck: no JVD Resp: clear to auscultation bilaterally Cardio: regular rate and rhythm, S1, S2 normal, no murmur, click, rub or gallop GI: soft, non-tender; bowel sounds normal; no masses,  no organomegaly Extremities: numbness left hand Pulses:  2+ and symmetric Skin: Skin color, texture, turgor normal. No rashes or lesions Neurologic: Grossly normal Incision/Wound: na  Assessment/Plan Assessment:  1. Bilateral carpal tunnel syndrome    Plan: She would like to proceed with surgical intervention. Pre-peri-and postoperative course are discussed along with risk complications. She is aware that there is no guarantee to the surgery the possibility of infection recurrence injury to arteries nerves tendons complete relief symptoms and dystrophy. She is scheduled for left carpal tunnel release and outpatient under regional anesthesia.      Deicy Rusk R 04/04/2017, 5:11 AM

## 2017-04-04 NOTE — Anesthesia Procedure Notes (Signed)
Anesthesia Regional Block: Bier block (IV Regional)   Pre-Anesthetic Checklist: ,, timeout performed, Correct Patient, Correct Site, Correct Laterality, Correct Procedure, Correct Position, site marked, Risks and benefits discussed,  Surgical consent,  Pre-op evaluation,  At surgeon's request and post-op pain management  Laterality: Left  Prep: chloraprep        Narrative:  Start time: 04/04/2017 10:29 AM End time: 04/04/2017 10:30 AM  Events:,,,,,,,,,,, other event

## 2017-04-04 NOTE — Op Note (Signed)
NAME:  Heather Olsen, NAPIERKOWSKI NO.:  [id number]  MEDICAL RECORD NO.:  [id number]  LOCATION:                                 FACILITY:  PHYSICIAN:  Daryll Brod, M.D.            DATE OF BIRTH:  DATE OF PROCEDURE:  04/04/2017 DATE OF DISCHARGE:                              OPERATIVE REPORT   PREOPERATIVE DIAGNOSIS:  Carpal tunnel syndrome, left hand.  POSTOPERATIVE DIAGNOSIS:  Carpal tunnel syndrome, left hand.  OPERATION:  Decompression of left median nerve.  SURGEON:  Daryll Brod, MD.  ANESTHESIA:  Forearm-based IV regional with light IV sedation, local infiltration.  PL[facility] OF SURGERY:  [facility]  [facility]  HISTORY:  The patient is an 83 year old female with history of numbness and tingling of her left hand.  She has positive nerve conductions. This has not responded to conservative treatment.  She has elected to undergo surgical decompression of the median nerve left side.  Pre, peri, and postoperative course have been discussed along with risks and complications.  She is aware that there is no guarantee to the surgery; the possibility of infection; recurrence of injury to arteries, nerves, tendons; incomplete relief of symptoms; and dystrophy.  In the preoperative area, the patient was seen, the extremity was marked by both patient and surgeon.  Antibiotic given.  PROCEDURE IN DETAIL:  The patient was brought to the operating room, where a forearm-based IV regional anesthetic was carried out without difficulty under the direction of the Anesthesia Department.  She was prepped using ChloraPrep in a supine position with the left arm free.  A 3-minute dry time was allowed and time-out taken, confirmed the patient and procedure.  A longitudinal incision was made in the left palm, carried down through subcutaneous tissue.  Bleeders were electrocauterized with bipolar.  The palmar fascia was split and the superficial palmar arch  identified.  The flexor tendon to the ring and little finger was identified.  Retractors were placed retracting the median nerve radially and the ulnar nerve ulnarly.  The flexor retinaculum was then incised with sharp dissection on the ulnar border. A right angle and Sewell retractor were placed between skin and forearm fascia.  Blunt scissors were then used to dissect underlying tissue away from the overlying fascia.  The fascia was then released with blunt scissors for approximately 2-3 cm proximal to the wrist crease under direct vision.  The canal was explored.  An area of compression to the nerve was apparent.  Motor branch entered into muscle distally.  The wound was copiously irrigated with saline.  The skin was closed with interrupted 4-0 nylon sutures.  A local infiltration with 0.25% bupivacaine without epinephrine was given, approximately 8 mL was used. Sterile compressive dressing with the fingers free was applied.  On deflation of the tourniquet, all fingers immediately pinked.  She was taken to the recovery room for observation in satisfactory condition.  She will be discharged to home to return to the [facility] in 1 week. She has tramadol to take.          ______________________________ Daryll Brod,  M.D.     Aretta Nip  D:  04/04/2017  T:  04/04/2017  Job:  [id number]

## 2017-04-04 NOTE — Anesthesia Preprocedure Evaluation (Signed)
Anesthesia Evaluation  Patient identified by MRN, date of birth, ID band Patient awake    Reviewed: Allergy & Precautions, NPO status , Patient's Chart, lab work & pertinent test results  Airway Mallampati: II  TM Distance: >3 FB Neck ROM: Full    Dental no notable dental hx.    Pulmonary neg pulmonary ROS,    Pulmonary exam normal breath sounds clear to auscultation       Cardiovascular hypertension, + CAD  negative cardio ROS Normal cardiovascular exam Rhythm:Regular Rate:Normal     Neuro/Psych Depression negative neurological ROS  negative psych ROS   GI/Hepatic negative GI ROS, Neg liver ROS, hiatal hernia,   Endo/Other  negative endocrine ROS  Renal/GU negative Renal ROS  negative genitourinary   Musculoskeletal negative musculoskeletal ROS (+)   Abdominal   Peds negative pediatric ROS (+)  Hematology negative hematology ROS (+)   Anesthesia Other Findings   Reproductive/Obstetrics negative OB ROS                             Anesthesia Physical Anesthesia Plan  ASA: III  Anesthesia Plan: Bier Block   Post-op Pain Management:    Induction: Intravenous  Airway Management Planned: Nasal Cannula  Additional Equipment:   Intra-op Plan:   Post-operative Plan: Extubation in OR  Informed Consent: I have reviewed the patients History and Physical, chart, labs and discussed the procedure including the risks, benefits and alternatives for the proposed anesthesia with the patient or authorized representative who has indicated his/her understanding and acceptance.   Dental advisory given  Plan Discussed with: CRNA  Anesthesia Plan Comments:         Anesthesia Quick Evaluation

## 2017-04-04 NOTE — Discharge Instructions (Addendum)

## 2017-04-04 NOTE — Op Note (Signed)
Dictation Number [id number]

## 2017-04-04 NOTE — Anesthesia Postprocedure Evaluation (Signed)
Anesthesia Post Note  Patient: Heather Olsen  Procedure(s) Performed: Procedure(s) (LRB): LEFT CARPAL TUNNEL RELEASE (Left)  Patient location during evaluation: PACU Anesthesia Type: Bier Block Level of consciousness: awake and alert Pain management: pain level controlled Vital Signs Assessment: post-procedure vital signs reviewed and stable Respiratory status: spontaneous breathing, nonlabored ventilation and respiratory function stable Cardiovascular status: stable and blood pressure returned to baseline Anesthetic complications: no       Last Vitals:  Vitals:   04/04/17 1115 04/04/17 1130  BP: (!) 183/84 (!) 196/84  Pulse: 70 (!) 53  Resp: 17 13  Temp:      Last Pain:  Vitals:   04/04/17 1115  TempSrc:   PainSc: 0-No pain                 Lynda Rainwater

## 2017-04-04 NOTE — Transfer of Care (Signed)
Immediate Anesthesia Transfer of Care Note  Patient: Heather Olsen  Procedure(s) Performed: Procedure(s) with comments: LEFT CARPAL TUNNEL RELEASE (Left) - REG/FAB  Patient Location: PACU  Anesthesia Type:Bier block  Level of Consciousness: awake  Airway & Oxygen Therapy: Patient Spontanous Breathing and Patient connected to face mask oxygen  Post-op Assessment: Report given to RN and Post -op Vital signs reviewed and stable  Post vital signs: Reviewed and stable  Last Vitals:  Vitals:   04/04/17 1004  BP: (!) 149/88  Pulse: 67  Resp: 20  Temp: 36.6 C    Last Pain:  Vitals:   04/04/17 1004  TempSrc: Oral         Complications: No apparent anesthesia complications

## 2017-04-05 ENCOUNTER — Encounter (HOSPITAL_BASED_OUTPATIENT_CLINIC_OR_DEPARTMENT_OTHER): Payer: Self-pay | Admitting: Orthopedic Surgery

## 2017-04-12 DIAGNOSIS — R109 Unspecified abdominal pain: Secondary | ICD-10-CM | POA: Diagnosis not present

## 2017-06-05 DIAGNOSIS — E119 Type 2 diabetes mellitus without complications: Secondary | ICD-10-CM | POA: Diagnosis not present

## 2017-06-05 DIAGNOSIS — Z7982 Long term (current) use of aspirin: Secondary | ICD-10-CM | POA: Diagnosis not present

## 2017-06-05 DIAGNOSIS — Z5181 Encounter for therapeutic drug level monitoring: Secondary | ICD-10-CM | POA: Diagnosis not present

## 2017-06-05 DIAGNOSIS — Z79899 Other long term (current) drug therapy: Secondary | ICD-10-CM | POA: Diagnosis not present

## 2017-06-05 DIAGNOSIS — F5104 Psychophysiologic insomnia: Secondary | ICD-10-CM | POA: Diagnosis not present

## 2017-06-05 DIAGNOSIS — I10 Essential (primary) hypertension: Secondary | ICD-10-CM | POA: Diagnosis not present

## 2017-06-13 DIAGNOSIS — D519 Vitamin B12 deficiency anemia, unspecified: Secondary | ICD-10-CM | POA: Diagnosis not present

## 2017-08-07 DIAGNOSIS — I10 Essential (primary) hypertension: Secondary | ICD-10-CM | POA: Diagnosis not present

## 2017-08-07 DIAGNOSIS — F5104 Psychophysiologic insomnia: Secondary | ICD-10-CM | POA: Diagnosis not present

## 2017-08-07 DIAGNOSIS — D519 Vitamin B12 deficiency anemia, unspecified: Secondary | ICD-10-CM | POA: Diagnosis not present

## 2017-08-07 DIAGNOSIS — N390 Urinary tract infection, site not specified: Secondary | ICD-10-CM | POA: Diagnosis not present

## 2017-08-17 DIAGNOSIS — Z961 Presence of intraocular lens: Secondary | ICD-10-CM | POA: Diagnosis not present

## 2017-08-17 DIAGNOSIS — H02834 Dermatochalasis of left upper eyelid: Secondary | ICD-10-CM | POA: Diagnosis not present

## 2017-08-17 DIAGNOSIS — H02831 Dermatochalasis of right upper eyelid: Secondary | ICD-10-CM | POA: Diagnosis not present

## 2017-08-17 DIAGNOSIS — H5213 Myopia, bilateral: Secondary | ICD-10-CM | POA: Diagnosis not present

## 2017-09-06 DIAGNOSIS — Z23 Encounter for immunization: Secondary | ICD-10-CM | POA: Diagnosis not present

## 2017-09-21 DIAGNOSIS — I10 Essential (primary) hypertension: Secondary | ICD-10-CM | POA: Diagnosis not present

## 2017-09-21 DIAGNOSIS — R1012 Left upper quadrant pain: Secondary | ICD-10-CM | POA: Diagnosis not present

## 2017-10-09 DIAGNOSIS — E119 Type 2 diabetes mellitus without complications: Secondary | ICD-10-CM | POA: Diagnosis not present

## 2017-10-09 DIAGNOSIS — R748 Abnormal levels of other serum enzymes: Secondary | ICD-10-CM | POA: Diagnosis not present

## 2017-10-11 DIAGNOSIS — G5603 Carpal tunnel syndrome, bilateral upper limbs: Secondary | ICD-10-CM | POA: Diagnosis not present

## 2017-10-16 DIAGNOSIS — E119 Type 2 diabetes mellitus without complications: Secondary | ICD-10-CM | POA: Diagnosis not present

## 2017-10-16 DIAGNOSIS — R1310 Dysphagia, unspecified: Secondary | ICD-10-CM | POA: Diagnosis not present

## 2017-10-16 DIAGNOSIS — I10 Essential (primary) hypertension: Secondary | ICD-10-CM | POA: Diagnosis not present

## 2017-10-16 DIAGNOSIS — R1012 Left upper quadrant pain: Secondary | ICD-10-CM | POA: Diagnosis not present

## 2017-10-17 DIAGNOSIS — H02413 Mechanical ptosis of bilateral eyelids: Secondary | ICD-10-CM | POA: Diagnosis not present

## 2017-10-23 ENCOUNTER — Telehealth: Payer: Self-pay | Admitting: *Deleted

## 2017-10-23 NOTE — Telephone Encounter (Signed)
Requesting surgical clearance:   1. Type of surgery: Blepharoplasty, Both Upper Lids, Skin only under IV Sedation.  2. Surgeon: Dr Georjean Mode  3. Surgical date: 11/03/2017  4. Medications that need to be held: Requesting Aspirin be held for 14 days.

## 2017-10-23 NOTE — Telephone Encounter (Signed)
   Chart reviewed as part of pre-operative protocol coverage. Given past medical history and time since last visit, based on ACC/AHA guidelines, Heather Olsen would be at acceptable risk for the planned procedure without further cardiovascular testing.   Requested procedure (Blepharoplasty) is low-risk. Last evaluated by Dr. Percival Spanish in 01/2017 and doing well from a cardiac perspective at that time. Last stent placement was in 2008 and recent NST in 10/2016 was low-risk. Can hold ASA for 14 days and resume once safe from a surgical prospective.   I will route this recommendation to the requesting party via Epic fax function and remove from pre-op pool.  Please call with questions.  Erma Heritage, PA-C 10/23/2017, 1:49 PM

## 2017-10-31 ENCOUNTER — Telehealth: Payer: Self-pay | Admitting: Cardiology

## 2017-10-31 ENCOUNTER — Other Ambulatory Visit: Payer: Self-pay | Admitting: Gastroenterology

## 2017-10-31 DIAGNOSIS — E782 Mixed hyperlipidemia: Secondary | ICD-10-CM | POA: Diagnosis not present

## 2017-10-31 DIAGNOSIS — I10 Essential (primary) hypertension: Secondary | ICD-10-CM | POA: Diagnosis not present

## 2017-10-31 DIAGNOSIS — E119 Type 2 diabetes mellitus without complications: Secondary | ICD-10-CM | POA: Diagnosis not present

## 2017-10-31 DIAGNOSIS — D519 Vitamin B12 deficiency anemia, unspecified: Secondary | ICD-10-CM | POA: Diagnosis not present

## 2017-10-31 DIAGNOSIS — R1012 Left upper quadrant pain: Secondary | ICD-10-CM

## 2017-10-31 DIAGNOSIS — M810 Age-related osteoporosis without current pathological fracture: Secondary | ICD-10-CM | POA: Diagnosis not present

## 2017-10-31 DIAGNOSIS — R748 Abnormal levels of other serum enzymes: Secondary | ICD-10-CM | POA: Diagnosis not present

## 2017-10-31 DIAGNOSIS — I2510 Atherosclerotic heart disease of native coronary artery without angina pectoris: Secondary | ICD-10-CM | POA: Diagnosis not present

## 2017-10-31 NOTE — Telephone Encounter (Signed)
Cardiac clearance faxed  

## 2017-10-31 NOTE — Telephone Encounter (Signed)
Per 12/4 phoe note: "Clearance needs to be faxed to  [place]  Surgeon:Dr Georjean Mode  Surgical date:11/03/2017"   Faxed via Epic.

## 2017-10-31 NOTE — Telephone Encounter (Signed)
New message   Clearance needs to be faxed to  [place]  Surgeon: Dr Georjean Mode   Surgical date: 11/03/2017

## 2017-11-01 DIAGNOSIS — D519 Vitamin B12 deficiency anemia, unspecified: Secondary | ICD-10-CM | POA: Diagnosis not present

## 2017-11-07 ENCOUNTER — Inpatient Hospital Stay: Admission: RE | Admit: 2017-11-07 | Payer: Medicare Other | Source: Ambulatory Visit

## 2017-11-07 ENCOUNTER — Ambulatory Visit
Admission: RE | Admit: 2017-11-07 | Discharge: 2017-11-07 | Disposition: A | Discharge status: Home / Self Care | Payer: Medicare Other | Source: Ambulatory Visit | Attending: Gastroenterology | Admitting: Gastroenterology

## 2017-11-07 DIAGNOSIS — K5790 Diverticulosis of intestine, part unspecified, without perforation or abscess without bleeding: Secondary | ICD-10-CM | POA: Diagnosis not present

## 2017-11-07 DIAGNOSIS — R1012 Left upper quadrant pain: Secondary | ICD-10-CM

## 2017-11-07 MED ORDER — IOPAMIDOL (ISOVUE-300) INJECTION 61%
100.0000 mL | Freq: Once | INTRAVENOUS | Status: AC | PRN
  Administered 2017-11-07: 100 mL via INTRAVENOUS

## 2017-11-15 DIAGNOSIS — R109 Unspecified abdominal pain: Secondary | ICD-10-CM | POA: Diagnosis not present

## 2017-11-15 DIAGNOSIS — M4120 Other idiopathic scoliosis, site unspecified: Secondary | ICD-10-CM | POA: Diagnosis not present

## 2017-11-15 DIAGNOSIS — M47817 Spondylosis without myelopathy or radiculopathy, lumbosacral region: Secondary | ICD-10-CM | POA: Diagnosis not present

## 2017-12-06 DIAGNOSIS — L430 Hypertrophic lichen planus: Secondary | ICD-10-CM | POA: Diagnosis not present

## 2017-12-06 DIAGNOSIS — L570 Actinic keratosis: Secondary | ICD-10-CM | POA: Diagnosis not present

## 2017-12-06 DIAGNOSIS — L821 Other seborrheic keratosis: Secondary | ICD-10-CM | POA: Diagnosis not present

## 2017-12-06 DIAGNOSIS — L814 Other melanin hyperpigmentation: Secondary | ICD-10-CM | POA: Diagnosis not present

## 2017-12-06 DIAGNOSIS — D692 Other nonthrombocytopenic purpura: Secondary | ICD-10-CM | POA: Diagnosis not present

## 2017-12-06 DIAGNOSIS — D1801 Hemangioma of skin and subcutaneous tissue: Secondary | ICD-10-CM | POA: Diagnosis not present

## 2017-12-06 DIAGNOSIS — L738 Other specified follicular disorders: Secondary | ICD-10-CM | POA: Diagnosis not present

## 2017-12-06 DIAGNOSIS — D485 Neoplasm of uncertain behavior of skin: Secondary | ICD-10-CM | POA: Diagnosis not present

## 2017-12-20 ENCOUNTER — Encounter (HOSPITAL_COMMUNITY): Payer: Self-pay

## 2017-12-20 ENCOUNTER — Other Ambulatory Visit: Payer: Self-pay

## 2017-12-20 ENCOUNTER — Emergency Department (HOSPITAL_COMMUNITY)
Admission: EM | Admit: 2017-12-20 | Discharge: 2017-12-20 | Disposition: A | Discharge status: Home / Self Care | Payer: Medicare Other | Attending: Emergency Medicine | Admitting: Emergency Medicine

## 2017-12-20 DIAGNOSIS — I10 Essential (primary) hypertension: Secondary | ICD-10-CM | POA: Insufficient documentation

## 2017-12-20 DIAGNOSIS — G8918 Other acute postprocedural pain: Secondary | ICD-10-CM | POA: Diagnosis not present

## 2017-12-20 DIAGNOSIS — Z7982 Long term (current) use of aspirin: Secondary | ICD-10-CM | POA: Insufficient documentation

## 2017-12-20 DIAGNOSIS — H02834 Dermatochalasis of left upper eyelid: Secondary | ICD-10-CM | POA: Diagnosis not present

## 2017-12-20 DIAGNOSIS — H02411 Mechanical ptosis of right eyelid: Secondary | ICD-10-CM | POA: Diagnosis not present

## 2017-12-20 DIAGNOSIS — H5789 Other specified disorders of eye and adnexa: Secondary | ICD-10-CM | POA: Diagnosis not present

## 2017-12-20 DIAGNOSIS — H02412 Mechanical ptosis of left eyelid: Secondary | ICD-10-CM | POA: Diagnosis not present

## 2017-12-20 DIAGNOSIS — Z79899 Other long term (current) drug therapy: Secondary | ICD-10-CM | POA: Insufficient documentation

## 2017-12-20 DIAGNOSIS — H02413 Mechanical ptosis of bilateral eyelids: Secondary | ICD-10-CM | POA: Diagnosis not present

## 2017-12-20 DIAGNOSIS — H02831 Dermatochalasis of right upper eyelid: Secondary | ICD-10-CM | POA: Diagnosis not present

## 2017-12-20 DIAGNOSIS — H5713 Ocular pain, bilateral: Secondary | ICD-10-CM | POA: Diagnosis present

## 2017-12-20 DIAGNOSIS — H02403 Unspecified ptosis of bilateral eyelids: Secondary | ICD-10-CM | POA: Diagnosis not present

## 2017-12-20 MED ORDER — KETOROLAC TROMETHAMINE 0.5 % OP SOLN
1.0000 [drp] | Freq: Once | OPHTHALMIC | Status: AC
  Administered 2017-12-20: 1 [drp] via OPHTHALMIC
  Filled 2017-12-20: qty 5

## 2017-12-20 NOTE — ED Provider Notes (Signed)
[facility] DEPT Provider Note   CSN: [id number] Arrival date & time: 12/20/17  1946     History   Chief Complaint Chief Complaint  Patient presents with  . Eye Pain  . Post-op Problem    HPI Heather Olsen is a 84 y.o. female.  She is here for evaluation of a burning sensation in both eyes, seeing spots, sneezing, and eye swelling since eye surgery this morning.  She describes having eyelid surgery, for "excess tissue."  She denies fever, nausea, vomiting, weakness or dizziness.  She is only used ice on her eyes once today despite being used.  She is not currently using any eyedrops.  There are no other known modifying factors.  HPI  Past Medical History:  Diagnosis Date  . Aortic stenosis 04/12/2016  . Back pain 04/12/2016  . Balance disorder 04/12/2016  . Beat, premature ventricular 01/08/2016  . Coronary artery disease   . Depression   . Diverticulosis   . Hearing loss 04/12/2016  . Hiatal hernia 04/12/2016  . HLD (hyperlipidemia) 04/12/2016  . Hyperglycemia 09/14/2016  . Hypertension   . Idiopathic scoliosis 04/12/2016  . Major depression 04/12/2016  . Mitral regurgitation   . Osteoporosis, senile 04/12/2016  . Pernicious anemia   . Premature atrial contraction   . Raynaud's disease 04/12/2016  . Scoliosis   . Spinal stenosis of lumbar region 04/12/2016  . Tricuspid valve prolapse     Patient Active Problem List   Diagnosis Date Noted  . Urgency of micturition 10/11/2016  . Hyperglycemia 09/14/2016  . Tricuspid insufficiency 06/27/2016  . Arteriosclerosis of coronary artery 04/12/2016  . HLD (hyperlipidemia) 04/12/2016  . Major depression 04/12/2016  . Idiopathic scoliosis 04/12/2016  . Spinal stenosis of lumbar region 04/12/2016  . Aortic stenosis 04/12/2016  . Mitral regurgitation 04/12/2016  . Pernicious anemia 04/12/2016  . Hearing loss 04/12/2016  . Glaucoma 04/12/2016  . Palpitations 04/12/2016  . Hiatal hernia 04/12/2016  .  Back pain 04/12/2016  . Osteoporosis, senile 04/12/2016  . Anemia 04/12/2016  . Raynaud's disease 04/12/2016  . Balance disorder 04/12/2016  . Beat, premature ventricular 01/08/2016  . APC (atrial premature contractions) 06/16/2014  . Fatigue 06/17/2013  . BP (high blood pressure) 06/17/2013    Past Surgical History:  Procedure Laterality Date  . APPENDECTOMY    . BREAST SURGERY    . broken wrist Right 2007  . CARPAL TUNNEL RELEASE Left 04/04/2017   Procedure: LEFT CARPAL TUNNEL RELEASE;  Surgeon: Daryll Brod, MD;  Location: [facility];  Service: Orthopedics;  Laterality: Left;  REG/FAB  . CATARACT EXTRACTION W/ INTRAOCULAR LENS  IMPLANT, BILATERAL Bilateral 2017  . COLONOSCOPY  2016  . FOOT SURGERY Right   . TONSILLECTOMY      OB History    No data available       Home Medications    Prior to Admission medications   Medication Sig Start Date End Date Taking? Authorizing Provider  acetaminophen (TYLENOL) 650 MG CR tablet Take 650 mg by mouth every 8 (eight) hours as needed for pain.   Yes [provider]  aspirin 81 MG chewable tablet Chew 81 mg by mouth. Take one daily   Yes [provider]  celecoxib (CELEBREX) 200 MG capsule TAKE 1 CAPSULE BY MOUTH EVERY DAY 12/16/16  Yes Estill Dooms, MD  Cholecalciferol (VITAMIN D3) 2000 units capsule Take 2,000 Units by mouth daily.    Yes [provider]  clonazePAM (KLONOPIN) 0.5 MG  tablet Pt take 3/4 mg by mouth every night at bedtime for restless leg   Yes [provider]  cyanocobalamin (,VITAMIN B-12,) 1000 MCG/ML injection Inject 1 each into the skin every 30 (thirty) days.   Yes [provider]  diclofenac sodium (VOLTAREN) 1 % GEL Apply 2 g topically 4 (four) times daily.   Yes [provider]  dicyclomine (BENTYL) 10 MG capsule Take 10 mg by mouth daily as needed for spasms.   Yes [provider]  docusate sodium (COLACE) 100 MG capsule Take 100 mg  by mouth 2 (two) times daily.   Yes [provider]  DULoxetine (CYMBALTA) 60 MG capsule Take 1 capsule by mouth daily. 01/25/17  Yes [provider]  esomeprazole (NEXIUM) 40 MG capsule TAKE ONE CAPSULE BY MOUTH DAILY FOR STOMACH 12/16/16  Yes Estill Dooms, MD  ipratropium (ATROVENT) 0.06 % nasal spray Use 2 sprays in each nostril up to three times daily before meals for sinus drainage. 09/30/16  Yes Estill Dooms, MD  Lactobacillus-Inulin ([place]) Take by mouth. Take one daily for probiotic   Yes [provider]  losartan (COZAAR) 50 MG tablet Take 50 mg by mouth daily.   Yes [provider]  metoprolol succinate (TOPROL-XL) 25 MG 24 hr tablet Take 25 mg by mouth daily.   Yes [provider]  MULTIPLE VITAMINS-MINERALS PO Take 1 tablet by mouth daily.    Yes [provider]  nitroGLYCERIN (NITROSTAT) 0.4 MG SL tablet Place under the tongue. Place one tablet under the tongue every 5 minutes as needed for chest pain. No more than 3   Yes [provider]  traMADol (ULTRAM) 50 MG tablet TAKE 1 TABLET BY MOUTH EVERY 4 HOURS 11/14/16  Yes Gayland Curry, DO    Family History Family History  Problem Relation Age of Onset  . Heart disease Mother        Died 92, MR, no CAD  . Heart disease Father        CHF, no CAD..   . HIV Daughter 63       from bone tissue transplant    Social History Social History   Tobacco Use  . Smoking status: Never Smoker  . Smokeless tobacco: Never Used  Substance Use Topics  . Alcohol use: Yes    Comment: wine 3-4 times a week  . Drug use: No     Allergies   Sulfa antibiotics; Topiramate; Verapamil; Zolpidem; Prevacid [lansoprazole]; Ace inhibitors; Codeine; and Ibandronic acid   Review of Systems Review of Systems  All other systems reviewed and are negative.    Physical Exam Updated Vital Signs BP (!) 165/94   Pulse 66   Temp 98.1 F (36.7 C) (Oral)    Resp 16   SpO2 98%   Physical Exam  Constitutional: She is oriented to person, place, and time. She appears well-developed. No distress.  Elderly, frail  HENT:  Head: Normocephalic and atraumatic.  Eyes: Conjunctivae and EOM are normal. Pupils are equal, round, and reactive to light.  Mild bilateral upper lid swelling.  Steri-Strips in place on wounds.  No swelling lower lids.  I am able to assess the anterior eyes, there is no conjunctival injection or drainage.  No visible foreign body in the eyes.  Pupillary reaction is normal.  External ocular muscles are intact.  Neck: Normal range of motion and phonation normal. Neck supple.  Cardiovascular: Normal rate and regular rhythm.  Pulmonary/Chest: Effort normal and breath sounds normal. She exhibits no tenderness.  Musculoskeletal: Normal range of motion.  Neurological: She is alert and oriented to person, place, and time. She exhibits normal muscle tone.  Skin: Skin is warm and dry.  Psychiatric: She has a normal mood and affect. Her behavior is normal. Judgment and thought content normal.  Nursing note and vitals reviewed.    ED Treatments / Results  Labs (all labs ordered are listed, but only abnormal results are displayed) Labs Reviewed - No data to display  EKG  EKG Interpretation None       Radiology No results found.  Procedures Procedures (including critical care time)  Medications Ordered in ED Medications  ketorolac (ACULAR) 0.5 % ophthalmic solution 1 drop (1 drop Both Eyes Given 12/20/17 2122)     Initial Impression / Assessment and Plan / ED Course  I have reviewed the triage vital signs and the nursing notes.  Pertinent labs & imaging results that were available during my care of the patient were reviewed by me and considered in my medical decision making (see chart for details).      Patient Vitals for the past 24 hrs:  BP Temp Temp src Pulse Resp SpO2  12/20/17 2145 (!) 165/94 - - 66 16 98 %    12/20/17 2115 (!) 173/106 - - 62 17 99 %  12/20/17 2035 (!) 195/111 98.1 F (36.7 C) Oral 74 16 96 %    11:07 PM Reevaluation with update and discussion. After initial assessment and treatment, an updated evaluation reveals she is now able to open her eyes better and states that she feels better.  Findings discussed with patient and family member, all questions answered. Daleen Bo     Final Clinical Impressions(s) / ED Diagnoses   Final diagnoses:  Post-operative pain   Postoperative eye pain, without signs of infection, or significant traumatic injury.  Patient improved after treatment with anti-inflammatory ophthalmic preparation.  Nursing Notes Reviewed/ Care Coordinated Applicable Imaging Reviewed Interpretation of Laboratory Data incorporated into ED treatment  The patient appears reasonably screened and/or stabilized for discharge and I doubt any other medical condition or other [facility] requiring further screening, evaluation, or treatment in the ED at this time prior to discharge.  Plan: Home Medications-continue current medications, use ketorolac drops every 6 hours as needed, OU-she was discharged with this medication; Home Treatments-rest, cryotherapy; return here if the recommended treatment, does not improve the symptoms; Recommended follow up-PCP and eye surgeon, as needed.   ED Discharge Orders    None       Daleen Bo, MD 12/20/17 2308

## 2017-12-20 NOTE — ED Triage Notes (Signed)
Pt c/o severe eye pain following eye surgery this morning. She contacted her surgeon, who recommended that she come here. Surgery was to removed excess skin from her eye lids. Eyes are swollen, draining clear fluid, and bruised. A&Ox4.

## 2017-12-20 NOTE — Discharge Instructions (Signed)
Use the ketorolac eyedrops 1 in each eye every 6 hours as needed for pain.  Call your eye surgeon, tomorrow morning to discuss the treatment and see if they have additional recommendations for your discomfort.  Continue using ice on the surgical sites every 2 or 3 hours for 30 minutes.

## 2017-12-20 NOTE — ED Notes (Signed)
Bed: XY80 Expected date:  Expected time:  Means of arrival:  Comments: Leonides Schanz

## 2018-01-01 DIAGNOSIS — I10 Essential (primary) hypertension: Secondary | ICD-10-CM | POA: Diagnosis not present

## 2018-01-01 DIAGNOSIS — E782 Mixed hyperlipidemia: Secondary | ICD-10-CM | POA: Diagnosis not present

## 2018-01-01 DIAGNOSIS — E119 Type 2 diabetes mellitus without complications: Secondary | ICD-10-CM | POA: Diagnosis not present

## 2018-01-17 DIAGNOSIS — E119 Type 2 diabetes mellitus without complications: Secondary | ICD-10-CM | POA: Diagnosis not present

## 2018-01-17 DIAGNOSIS — E782 Mixed hyperlipidemia: Secondary | ICD-10-CM | POA: Diagnosis not present

## 2018-01-17 DIAGNOSIS — I10 Essential (primary) hypertension: Secondary | ICD-10-CM | POA: Diagnosis not present

## 2018-01-31 DIAGNOSIS — Z1212 Encounter for screening for malignant neoplasm of rectum: Secondary | ICD-10-CM | POA: Diagnosis not present

## 2018-01-31 DIAGNOSIS — E119 Type 2 diabetes mellitus without complications: Secondary | ICD-10-CM | POA: Diagnosis not present

## 2018-01-31 DIAGNOSIS — Z0000 Encounter for general adult medical examination without abnormal findings: Secondary | ICD-10-CM | POA: Diagnosis not present

## 2018-01-31 DIAGNOSIS — I10 Essential (primary) hypertension: Secondary | ICD-10-CM | POA: Diagnosis not present

## 2018-01-31 DIAGNOSIS — R748 Abnormal levels of other serum enzymes: Secondary | ICD-10-CM | POA: Diagnosis not present

## 2018-01-31 DIAGNOSIS — K219 Gastro-esophageal reflux disease without esophagitis: Secondary | ICD-10-CM | POA: Diagnosis not present

## 2018-01-31 DIAGNOSIS — Z01419 Encounter for gynecological examination (general) (routine) without abnormal findings: Secondary | ICD-10-CM | POA: Diagnosis not present

## 2018-02-01 DIAGNOSIS — H04123 Dry eye syndrome of bilateral lacrimal glands: Secondary | ICD-10-CM | POA: Diagnosis not present

## 2018-02-05 ENCOUNTER — Telehealth: Payer: Self-pay | Admitting: Cardiology

## 2018-02-05 NOTE — Telephone Encounter (Signed)
Closed Encounter  °

## 2018-02-12 ENCOUNTER — Ambulatory Visit: Payer: Medicare Other | Admitting: Cardiology

## 2018-02-14 ENCOUNTER — Encounter: Payer: Self-pay | Admitting: Cardiology

## 2018-02-21 NOTE — Progress Notes (Signed)
Cardiology Office Note   Date:  02/22/2018   ID:  Heather Jewelia, Olsen [birth year], MRN [id number]  PCP:  Deland Pretty, MD  Cardiologist:   Minus Breeding, MD   Chief Complaint  Patient presents with  . Coronary Artery Disease      History of Present Illness: Heather Olsen is a 84 y.o. female who presents for follow up of CAD. She had stent placement in 2008 and was followed by [facility].  She had an LAD stent placed in 2008 which was a 3.8 mm Vision. She had follow-up cath in 2009 with 30%  narrowing of the stent but otherwise no disease. Stress perfusion study in 2014 was negative. She does have some tricuspid regurgitation and some mild mitral regurgitation.  Lexiscan Myoview was negative in 2017.     Since I last saw her she has had no new cardiovascular complaints.  She tries to walk 2 flights of stairs a day in her [place] community.  With this she denies any chest pressure, neck or arm discomfort.  She has no palpitations, presyncope or syncope.  She has some mild shortness of breath.  She does not get PND or orthopnea.  Past Medical History:  Diagnosis Date  . Aortic stenosis 04/12/2016  . Back pain 04/12/2016  . Balance disorder 04/12/2016  . Beat, premature ventricular 01/08/2016  . Coronary artery disease   . Depression   . Diverticulosis   . Hearing loss 04/12/2016  . Hiatal hernia 04/12/2016  . HLD (hyperlipidemia) 04/12/2016  . Hyperglycemia 09/14/2016  . Hypertension   . Idiopathic scoliosis 04/12/2016  . Major depression 04/12/2016  . Mitral regurgitation   . Osteoporosis, senile 04/12/2016  . Pernicious anemia   . Premature atrial contraction   . Raynaud's disease 04/12/2016  . Scoliosis   . Spinal stenosis of lumbar region 04/12/2016  . Tricuspid valve prolapse     Past Surgical History:  Procedure Laterality Date  . APPENDECTOMY    . BREAST SURGERY    . broken wrist Right 2007  . CARPAL TUNNEL RELEASE Left 04/04/2017   Procedure: LEFT CARPAL  TUNNEL RELEASE;  Surgeon: Daryll Brod, MD;  Location: [facility];  Service: Orthopedics;  Laterality: Left;  REG/FAB  . CATARACT EXTRACTION W/ INTRAOCULAR LENS  IMPLANT, BILATERAL Bilateral 2017  . COLONOSCOPY  2016  . FOOT SURGERY Right   . TONSILLECTOMY       Current Outpatient Medications  Medication Sig Dispense Refill  . acetaminophen (TYLENOL) 650 MG CR tablet Take 650 mg by mouth every 8 (eight) hours as needed for pain.    [name] aspirin 81 MG chewable tablet Chew 81 mg by mouth. Take one daily    . celecoxib (CELEBREX) 200 MG capsule TAKE 1 CAPSULE BY MOUTH EVERY DAY 30 capsule 0  . Cholecalciferol (VITAMIN D3) 2000 units capsule Take 2,000 Units by mouth daily.     . clonazePAM (KLONOPIN) 0.5 MG tablet Pt take 3/4 mg by mouth every night at bedtime for restless leg    . cyanocobalamin (,VITAMIN B-12,) 1000 MCG/ML injection Inject 1 each into the skin every 30 (thirty) days.    . diclofenac sodium (VOLTAREN) 1 % GEL Apply 2 g topically 4 (four) times daily.    [name] dicyclomine (BENTYL) 10 MG capsule Take 10 mg by mouth daily as needed for spasms.    [name] docusate sodium (COLACE) 100 MG capsule Take 100 mg by mouth daily.     . DULoxetine (  CYMBALTA) 60 MG capsule Take 1 capsule by mouth daily.    [name] esomeprazole (NEXIUM) 40 MG capsule TAKE ONE CAPSULE BY MOUTH DAILY FOR STOMACH 30 capsule 2  . ipratropium (ATROVENT) 0.06 % nasal spray Use 2 sprays in each nostril up to three times daily before meals for sinus drainage. 30 mL 3  . Lactobacillus-Inulin ([place] PO) Take by mouth. Take one daily for probiotic    . losartan (COZAAR) 50 MG tablet Take 50 mg by mouth daily.    . metoprolol succinate (TOPROL-XL) 25 MG 24 hr tablet Take 25 mg by mouth daily.    . MULTIPLE VITAMINS-MINERALS PO Take 1 tablet by mouth daily.     . nitroGLYCERIN (NITROSTAT) 0.4 MG SL tablet Place under the tongue. Place one tablet under the tongue every 5 minutes as needed for chest pain.  No more than 3    . traMADol (ULTRAM) 50 MG tablet TAKE 1 TABLET BY MOUTH EVERY 4 HOURS 75 tablet 0   No current facility-administered medications for this visit.     Allergies:   Sulfa antibiotics; Topiramate; Verapamil; Zolpidem; Prevacid [lansoprazole]; Ace inhibitors; Codeine; and Ibandronic acid    ROS:  Please see the history of present illness.   Otherwise, review of systems are positive for none.   All other systems are reviewed and negative.    PHYSICAL EXAM: VS:  BP (!) 146/90   Pulse (!) 55   Ht 5\' 4"  (1.626 m)   Wt 115 lb 3.2 oz (52.3 kg)   BMI 19.77 kg/m  , BMI Body mass index is 19.77 kg/m.  GENERAL:  Well appearing NECK:  No jugular venous distention, waveform within normal limits, carotid upstroke brisk and symmetric, no bruits, no thyromegaly LUNGS:  Clear to auscultation bilaterally CHEST:  Scoliosis HEART:  PMI not displaced or sustained,S1 and S2 within normal limits, no S3, no S4, no clicks, no rubs, no murmurs ABD:  Flat, positive bowel sounds normal in frequency in pitch, no bruits, no rebound, no guarding, no midline pulsatile mass, no hepatomegaly, no splenomegaly EXT:  2 plus pulses throughout, no edema, no cyanosis no clubbing, dependent rubor.     EKG:  EKG is  ordered today. The ekg ordered today demonstrates sinus bradycardia, rate 55, left axis deviation, left anterior fascicular block, RSR prime V1 and V2, poor anterior R wave progression, first-degree AV block, no acute ST-T wave changes.     Recent Labs: 04/04/2017: BUN 26; Creatinine, Ser 0.80; Hemoglobin 13.6; Potassium 4.6; Sodium 138    Lipid Panel    Component Value Date/Time   CHOL 232 (H) 09/14/2016 1415   TRIG 168 (H) 09/14/2016 1415   HDL 99 09/14/2016 1415   CHOLHDL 2.3 09/14/2016 1415   VLDL 34 (H) 09/14/2016 1415   LDLCALC 99 09/14/2016 1415      Wt Readings from Last 3 Encounters:  02/22/18 115 lb 3.2 oz (52.3 kg)  04/04/17 115 lb (52.2 kg)  01/30/17 118 lb 9.6 oz (53.8  kg)      Other studies Reviewed: Additional studies/ records that were reviewed today include: Labs Review of the above records demonstrates:    ASSESSMENT AND PLAN:    CAD:  She had a negative Lexiscan Myoview in December of 2017. No further testing is indicated.    PALPITATIONS:   She has had an irregular heartbeat noted on exam by multiple folks.  She doesn't really feel palpitations.  I will apply a 48 hour Holter  AORTIC STENOSIS:    She has had mild AS and MR.  No change in therapy or further testing is indicated.   HTN: The blood pressure is mildly elevated but she was just started on Cozaar.  I will not make any changes.   DYSLIPIDEMIA:   Her LDL is elevated but the HDL is excellent.  No change in therapy.  She has been very sensitive to medications.    Current medicines are reviewed at length with the patient today.  The patient does not have  concerns regarding medicines.  The following changes have been made:  None  Labs/ tests ordered today include:      Orders Placed This Encounter  Procedures  . HOLTER MONITOR - 48 HOUR  . EKG 12-Lead     Disposition:   FU with me in 12 months.     Signed, Minus Breeding, MD  02/22/2018 3:19 PM    [facility]

## 2018-02-22 ENCOUNTER — Encounter: Payer: Self-pay | Admitting: Cardiology

## 2018-02-22 ENCOUNTER — Ambulatory Visit (INDEPENDENT_AMBULATORY_CARE_PROVIDER_SITE_OTHER): Payer: Medicare Other | Admitting: Cardiology

## 2018-02-22 VITALS — BP 146/90 | HR 55 | Ht 64.0 in | Wt 115.2 lb

## 2018-02-22 DIAGNOSIS — I10 Essential (primary) hypertension: Secondary | ICD-10-CM

## 2018-02-22 DIAGNOSIS — E785 Hyperlipidemia, unspecified: Secondary | ICD-10-CM | POA: Diagnosis not present

## 2018-02-22 DIAGNOSIS — I499 Cardiac arrhythmia, unspecified: Secondary | ICD-10-CM

## 2018-02-22 DIAGNOSIS — I2510 Atherosclerotic heart disease of native coronary artery without angina pectoris: Secondary | ICD-10-CM | POA: Diagnosis not present

## 2018-02-22 NOTE — Patient Instructions (Signed)
Medication Instructions:  Continue current medications  If you need a refill on your cardiac medications before your next appointment, please call your pharmacy.  Labwork: None Ordered   Testing/Procedures: Your physician has recommended that you wear a holter monitor for 48 hour. Holter monitors are medical devices that record the heart's electrical activity. Doctors most often use these monitors to diagnose arrhythmias. Arrhythmias are problems with the speed or rhythm of the heartbeat. The monitor is a small, portable device. You can wear one while you do your normal daily activities. This is usually used to diagnose what is causing palpitations/syncope (passing out).  Follow-Up: Your physician wants you to follow-up in: 1 Year. You should receive a reminder letter in the mail two months in advance. If you do not receive a letter, please call our office [phone].    Thank you for choosing [facility] at [facility]!!

## 2018-02-28 ENCOUNTER — Ambulatory Visit (INDEPENDENT_AMBULATORY_CARE_PROVIDER_SITE_OTHER): Payer: Medicare Other

## 2018-02-28 ENCOUNTER — Other Ambulatory Visit: Payer: Self-pay | Admitting: Cardiology

## 2018-02-28 DIAGNOSIS — I499 Cardiac arrhythmia, unspecified: Secondary | ICD-10-CM

## 2018-02-28 DIAGNOSIS — I441 Atrioventricular block, second degree: Secondary | ICD-10-CM

## 2018-03-20 ENCOUNTER — Telehealth: Payer: Self-pay | Admitting: Cardiology

## 2018-03-20 NOTE — Telephone Encounter (Signed)
Returned call to patient monitor results given. 

## 2018-03-20 NOTE — Telephone Encounter (Signed)
New message ° °Pt verbalized that she is returning call for RN °

## 2018-04-17 DIAGNOSIS — E118 Type 2 diabetes mellitus with unspecified complications: Secondary | ICD-10-CM | POA: Diagnosis not present

## 2018-04-17 DIAGNOSIS — E119 Type 2 diabetes mellitus without complications: Secondary | ICD-10-CM | POA: Diagnosis not present

## 2018-04-24 DIAGNOSIS — E119 Type 2 diabetes mellitus without complications: Secondary | ICD-10-CM | POA: Diagnosis not present

## 2018-04-24 DIAGNOSIS — I10 Essential (primary) hypertension: Secondary | ICD-10-CM | POA: Diagnosis not present

## 2018-04-24 DIAGNOSIS — I2510 Atherosclerotic heart disease of native coronary artery without angina pectoris: Secondary | ICD-10-CM | POA: Diagnosis not present

## 2018-05-07 DIAGNOSIS — R1030 Lower abdominal pain, unspecified: Secondary | ICD-10-CM | POA: Diagnosis not present

## 2018-05-14 DIAGNOSIS — I7300 Raynaud's syndrome without gangrene: Secondary | ICD-10-CM | POA: Diagnosis not present

## 2018-05-14 DIAGNOSIS — M1712 Unilateral primary osteoarthritis, left knee: Secondary | ICD-10-CM | POA: Diagnosis not present

## 2018-05-14 DIAGNOSIS — M79643 Pain in unspecified hand: Secondary | ICD-10-CM | POA: Diagnosis not present

## 2018-05-14 DIAGNOSIS — G5600 Carpal tunnel syndrome, unspecified upper limb: Secondary | ICD-10-CM | POA: Diagnosis not present

## 2018-05-14 DIAGNOSIS — M1990 Unspecified osteoarthritis, unspecified site: Secondary | ICD-10-CM | POA: Diagnosis not present

## 2018-05-14 DIAGNOSIS — M1711 Unilateral primary osteoarthritis, right knee: Secondary | ICD-10-CM | POA: Diagnosis not present

## 2018-05-14 DIAGNOSIS — M25569 Pain in unspecified knee: Secondary | ICD-10-CM | POA: Diagnosis not present

## 2018-05-14 DIAGNOSIS — M2550 Pain in unspecified joint: Secondary | ICD-10-CM | POA: Diagnosis not present

## 2018-06-27 DIAGNOSIS — M7061 Trochanteric bursitis, right hip: Secondary | ICD-10-CM | POA: Diagnosis not present

## 2018-06-27 DIAGNOSIS — M4120 Other idiopathic scoliosis, site unspecified: Secondary | ICD-10-CM | POA: Diagnosis not present

## 2018-06-27 DIAGNOSIS — M545 Low back pain: Secondary | ICD-10-CM | POA: Diagnosis not present

## 2018-08-06 DIAGNOSIS — K219 Gastro-esophageal reflux disease without esophagitis: Secondary | ICD-10-CM | POA: Diagnosis not present

## 2018-08-06 DIAGNOSIS — Z23 Encounter for immunization: Secondary | ICD-10-CM | POA: Diagnosis not present

## 2018-08-06 DIAGNOSIS — D519 Vitamin B12 deficiency anemia, unspecified: Secondary | ICD-10-CM | POA: Diagnosis not present

## 2018-08-06 DIAGNOSIS — I10 Essential (primary) hypertension: Secondary | ICD-10-CM | POA: Diagnosis not present

## 2018-08-06 DIAGNOSIS — F329 Major depressive disorder, single episode, unspecified: Secondary | ICD-10-CM | POA: Diagnosis not present

## 2018-08-20 DIAGNOSIS — H04123 Dry eye syndrome of bilateral lacrimal glands: Secondary | ICD-10-CM | POA: Diagnosis not present

## 2018-08-20 DIAGNOSIS — Z961 Presence of intraocular lens: Secondary | ICD-10-CM | POA: Diagnosis not present

## 2018-08-20 DIAGNOSIS — H5213 Myopia, bilateral: Secondary | ICD-10-CM | POA: Diagnosis not present

## 2018-08-31 DIAGNOSIS — D485 Neoplasm of uncertain behavior of skin: Secondary | ICD-10-CM | POA: Diagnosis not present

## 2018-08-31 DIAGNOSIS — L720 Epidermal cyst: Secondary | ICD-10-CM | POA: Diagnosis not present

## 2018-08-31 DIAGNOSIS — L821 Other seborrheic keratosis: Secondary | ICD-10-CM | POA: Diagnosis not present

## 2018-08-31 DIAGNOSIS — D2339 Other benign neoplasm of skin of other parts of face: Secondary | ICD-10-CM | POA: Diagnosis not present

## 2018-09-11 DIAGNOSIS — M4120 Other idiopathic scoliosis, site unspecified: Secondary | ICD-10-CM | POA: Diagnosis not present

## 2018-09-11 DIAGNOSIS — M47817 Spondylosis without myelopathy or radiculopathy, lumbosacral region: Secondary | ICD-10-CM | POA: Diagnosis not present

## 2018-09-11 DIAGNOSIS — M545 Low back pain: Secondary | ICD-10-CM | POA: Diagnosis not present

## 2018-10-01 DIAGNOSIS — M545 Low back pain: Secondary | ICD-10-CM | POA: Diagnosis not present

## 2018-10-01 DIAGNOSIS — M47817 Spondylosis without myelopathy or radiculopathy, lumbosacral region: Secondary | ICD-10-CM | POA: Diagnosis not present

## 2018-10-01 DIAGNOSIS — M4120 Other idiopathic scoliosis, site unspecified: Secondary | ICD-10-CM | POA: Diagnosis not present

## 2018-10-30 DIAGNOSIS — M47817 Spondylosis without myelopathy or radiculopathy, lumbosacral region: Secondary | ICD-10-CM | POA: Diagnosis not present

## 2018-10-30 DIAGNOSIS — E119 Type 2 diabetes mellitus without complications: Secondary | ICD-10-CM | POA: Diagnosis not present

## 2018-10-30 DIAGNOSIS — D519 Vitamin B12 deficiency anemia, unspecified: Secondary | ICD-10-CM | POA: Diagnosis not present

## 2018-10-30 DIAGNOSIS — M4120 Other idiopathic scoliosis, site unspecified: Secondary | ICD-10-CM | POA: Diagnosis not present

## 2018-10-30 DIAGNOSIS — E118 Type 2 diabetes mellitus with unspecified complications: Secondary | ICD-10-CM | POA: Diagnosis not present

## 2018-10-30 DIAGNOSIS — M545 Low back pain: Secondary | ICD-10-CM | POA: Diagnosis not present

## 2018-10-31 DIAGNOSIS — R945 Abnormal results of liver function studies: Secondary | ICD-10-CM | POA: Diagnosis not present

## 2018-10-31 DIAGNOSIS — R748 Abnormal levels of other serum enzymes: Secondary | ICD-10-CM | POA: Diagnosis not present

## 2018-11-06 DIAGNOSIS — I2510 Atherosclerotic heart disease of native coronary artery without angina pectoris: Secondary | ICD-10-CM | POA: Diagnosis not present

## 2018-11-06 DIAGNOSIS — E119 Type 2 diabetes mellitus without complications: Secondary | ICD-10-CM | POA: Diagnosis not present

## 2018-11-06 DIAGNOSIS — E782 Mixed hyperlipidemia: Secondary | ICD-10-CM | POA: Diagnosis not present

## 2018-11-14 DIAGNOSIS — G4761 Periodic limb movement disorder: Secondary | ICD-10-CM | POA: Diagnosis not present

## 2018-11-29 DIAGNOSIS — R945 Abnormal results of liver function studies: Secondary | ICD-10-CM | POA: Diagnosis not present

## 2018-11-29 DIAGNOSIS — R799 Abnormal finding of blood chemistry, unspecified: Secondary | ICD-10-CM | POA: Diagnosis not present

## 2018-12-04 DIAGNOSIS — M4120 Other idiopathic scoliosis, site unspecified: Secondary | ICD-10-CM | POA: Diagnosis not present

## 2018-12-04 DIAGNOSIS — M545 Low back pain: Secondary | ICD-10-CM | POA: Diagnosis not present

## 2018-12-04 DIAGNOSIS — M47817 Spondylosis without myelopathy or radiculopathy, lumbosacral region: Secondary | ICD-10-CM | POA: Diagnosis not present

## 2018-12-05 ENCOUNTER — Other Ambulatory Visit: Payer: Self-pay | Admitting: Physical Medicine and Rehabilitation

## 2018-12-05 DIAGNOSIS — R29898 Other symptoms and signs involving the musculoskeletal system: Secondary | ICD-10-CM

## 2018-12-10 DIAGNOSIS — R748 Abnormal levels of other serum enzymes: Secondary | ICD-10-CM | POA: Diagnosis not present

## 2018-12-17 ENCOUNTER — Ambulatory Visit
Admission: RE | Admit: 2018-12-17 | Discharge: 2018-12-17 | Disposition: A | Discharge status: Home / Self Care | Payer: Medicare Other | Source: Ambulatory Visit | Attending: Physical Medicine and Rehabilitation | Admitting: Physical Medicine and Rehabilitation

## 2018-12-17 DIAGNOSIS — R29898 Other symptoms and signs involving the musculoskeletal system: Secondary | ICD-10-CM

## 2018-12-17 DIAGNOSIS — M48061 Spinal stenosis, lumbar region without neurogenic claudication: Secondary | ICD-10-CM | POA: Diagnosis not present

## 2018-12-21 DIAGNOSIS — I2510 Atherosclerotic heart disease of native coronary artery without angina pectoris: Secondary | ICD-10-CM | POA: Diagnosis not present

## 2018-12-21 DIAGNOSIS — R3915 Urgency of urination: Secondary | ICD-10-CM | POA: Diagnosis not present

## 2018-12-21 DIAGNOSIS — R300 Dysuria: Secondary | ICD-10-CM | POA: Diagnosis not present

## 2018-12-21 DIAGNOSIS — M8580 Other specified disorders of bone density and structure, unspecified site: Secondary | ICD-10-CM | POA: Diagnosis not present

## 2018-12-21 DIAGNOSIS — D519 Vitamin B12 deficiency anemia, unspecified: Secondary | ICD-10-CM | POA: Diagnosis not present

## 2018-12-21 DIAGNOSIS — N390 Urinary tract infection, site not specified: Secondary | ICD-10-CM | POA: Diagnosis not present

## 2018-12-25 DIAGNOSIS — M4120 Other idiopathic scoliosis, site unspecified: Secondary | ICD-10-CM | POA: Diagnosis not present

## 2018-12-25 DIAGNOSIS — M545 Low back pain: Secondary | ICD-10-CM | POA: Diagnosis not present

## 2018-12-25 DIAGNOSIS — M47817 Spondylosis without myelopathy or radiculopathy, lumbosacral region: Secondary | ICD-10-CM | POA: Diagnosis not present

## 2019-01-08 DIAGNOSIS — N3000 Acute cystitis without hematuria: Secondary | ICD-10-CM | POA: Diagnosis not present

## 2019-01-08 DIAGNOSIS — N390 Urinary tract infection, site not specified: Secondary | ICD-10-CM | POA: Diagnosis not present

## 2019-01-16 DIAGNOSIS — I788 Other diseases of capillaries: Secondary | ICD-10-CM | POA: Diagnosis not present

## 2019-01-16 DIAGNOSIS — L814 Other melanin hyperpigmentation: Secondary | ICD-10-CM | POA: Diagnosis not present

## 2019-01-16 DIAGNOSIS — L570 Actinic keratosis: Secondary | ICD-10-CM | POA: Diagnosis not present

## 2019-01-16 DIAGNOSIS — D1801 Hemangioma of skin and subcutaneous tissue: Secondary | ICD-10-CM | POA: Diagnosis not present

## 2019-01-16 DIAGNOSIS — L720 Epidermal cyst: Secondary | ICD-10-CM | POA: Diagnosis not present

## 2019-01-16 DIAGNOSIS — L821 Other seborrheic keratosis: Secondary | ICD-10-CM | POA: Diagnosis not present

## 2019-01-29 DIAGNOSIS — I10 Essential (primary) hypertension: Secondary | ICD-10-CM | POA: Diagnosis not present

## 2019-01-29 DIAGNOSIS — N390 Urinary tract infection, site not specified: Secondary | ICD-10-CM | POA: Diagnosis not present

## 2019-01-29 DIAGNOSIS — E538 Deficiency of other specified B group vitamins: Secondary | ICD-10-CM | POA: Diagnosis not present

## 2019-01-29 DIAGNOSIS — E7800 Pure hypercholesterolemia, unspecified: Secondary | ICD-10-CM | POA: Diagnosis not present

## 2019-01-29 DIAGNOSIS — E119 Type 2 diabetes mellitus without complications: Secondary | ICD-10-CM | POA: Diagnosis not present

## 2019-01-29 DIAGNOSIS — M810 Age-related osteoporosis without current pathological fracture: Secondary | ICD-10-CM | POA: Diagnosis not present

## 2019-02-03 IMAGING — MR MR LUMBAR SPINE W/O CM
6 series · 44 of 48 positions shown · non-contrast
Comparison: MRI lumbar spine 09/29/2016

CLINICAL DATA: Bilateral leg weakness.  Low back pain.  Scoliosis.

EXAM:
MRI LUMBAR SPINE WITHOUT CONTRAST
TECHNIQUE: Multiplanar, multisequence MR imaging of the lumbar spine was
performed. No intravenous contrast was administered.

[Series 3: T2 · sagittal · 4.0mm · 0.78mm/px · 7 of 21 slices shown (1 of 3)]
[im 1/21]
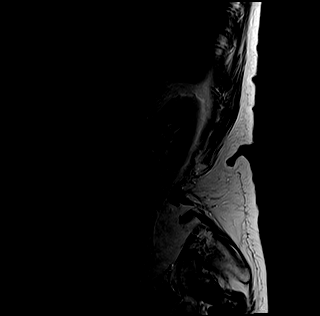
[im 4/21]
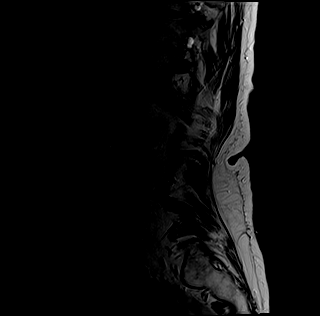
[im 7/21]
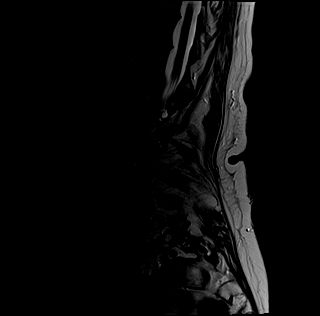
[im 11/21]
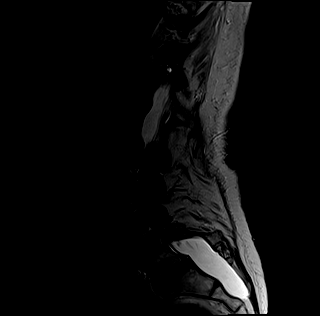
[im 14/21]
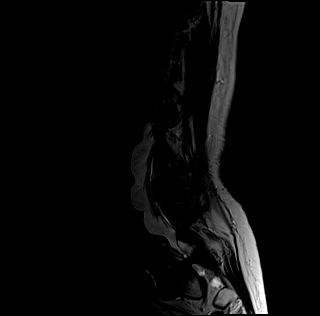
[im 17/21]
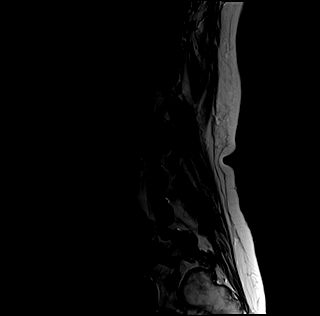
[im 21/21]
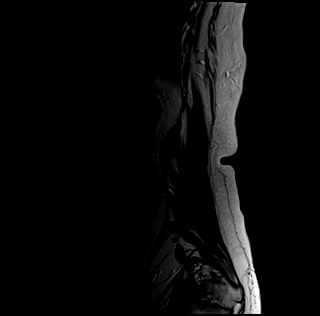

[Series 4: tirm sag · sagittal · 4.0mm · 0.51mm/px · 6 of 21 slices shown]
[im 1/21]
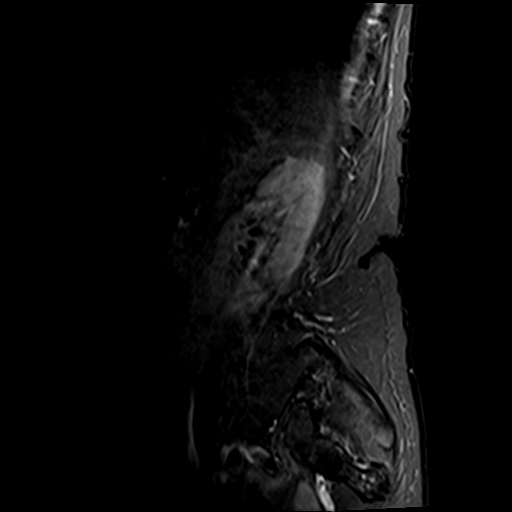
[im 4/21]
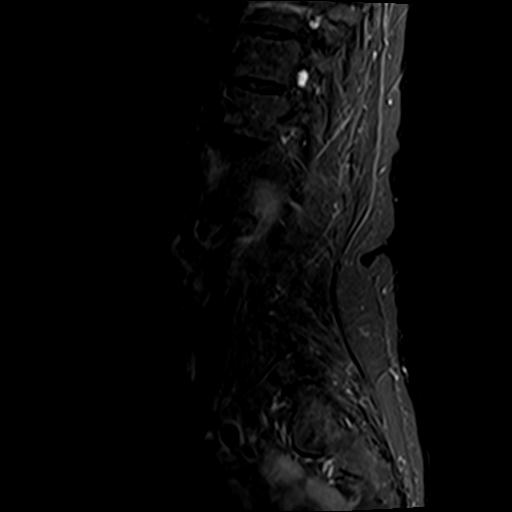
[im 7/21]
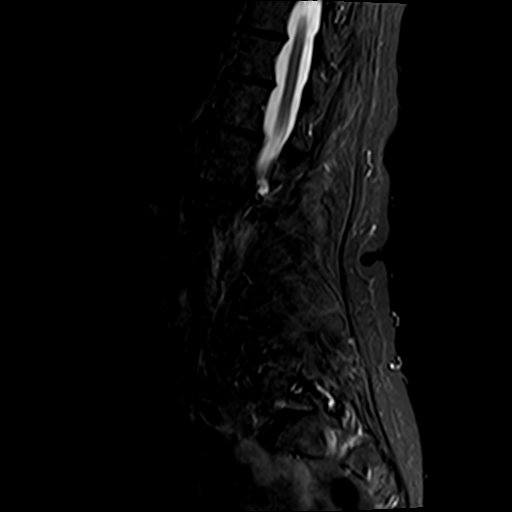
[im 11/21]
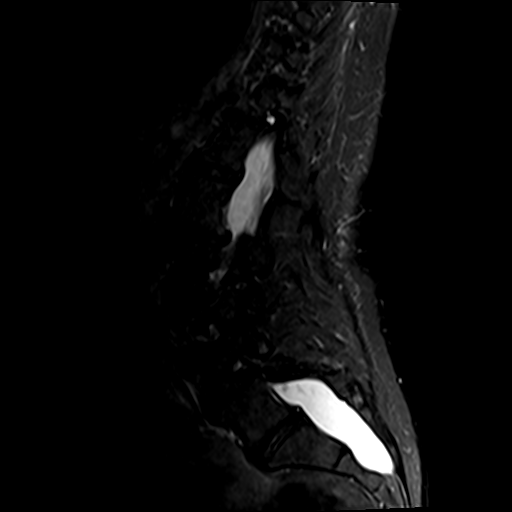
[im 14/21]
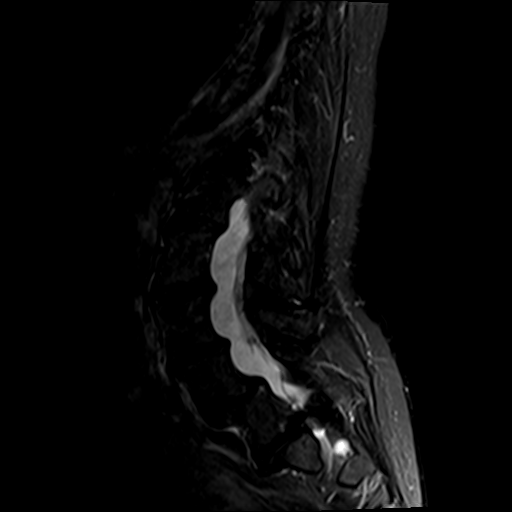
[im 17/21]
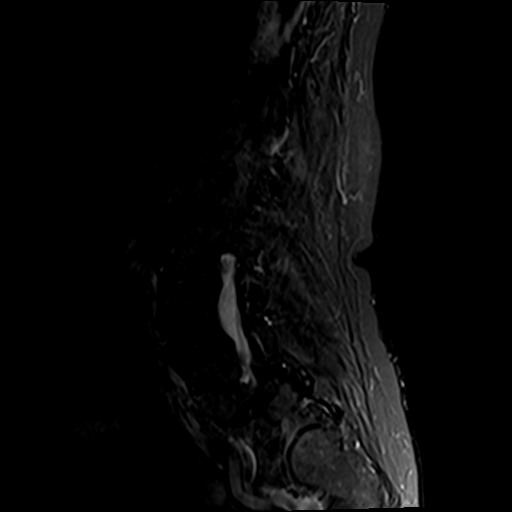

[Series 5: T1 · sagittal · 4.0mm · 0.81mm/px · 6 of 21 slices shown (1 of 2)]
[im 1/21]
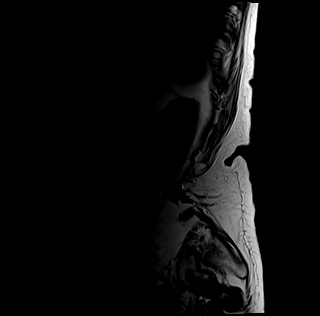
[im 5/21]
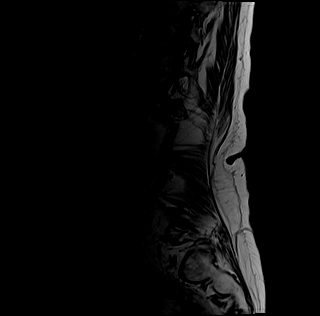
[im 9/21]
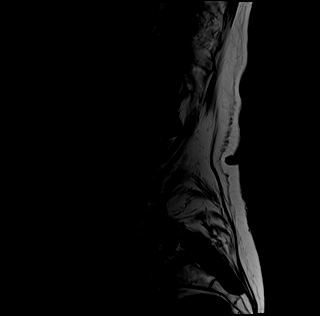
[im 13/21]
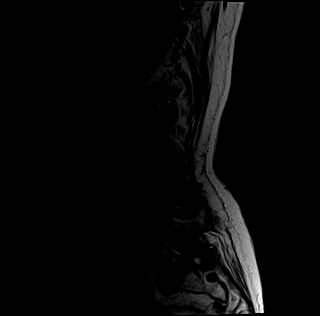
[im 17/21]
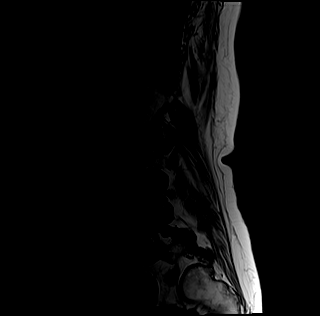
[im 21/21]
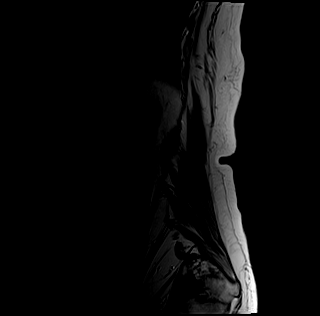

[Series 6: T2 · coronal · 4.0mm · 1.30mm/px · 6 of 20 slices shown (2 of 3)]
[im 1/20]
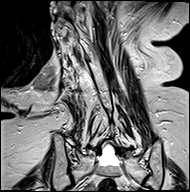
[im 4/20]
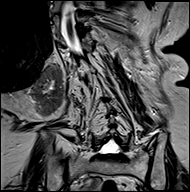
[im 8/20]
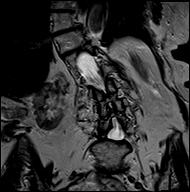
[im 12/20]
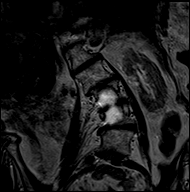
[im 16/20]
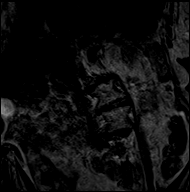
[im 20/20]
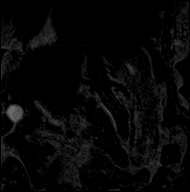

[Series 7: T1 · axial · 4.0mm · 0.70mm/px · z∈[-9,+172]mm · 8 of 37 slices shown (2 of 2)]
[im 1/37]
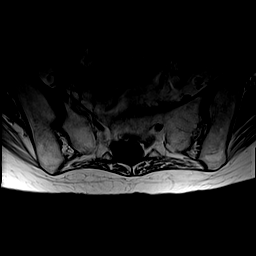
[im 8/37]
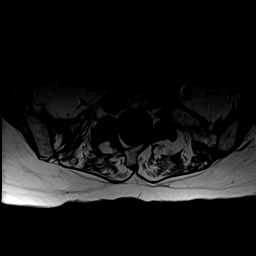
[im 11/37]
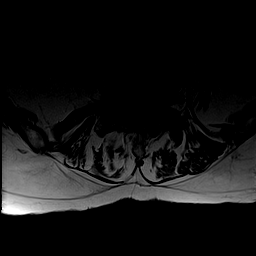
[im 15/37]
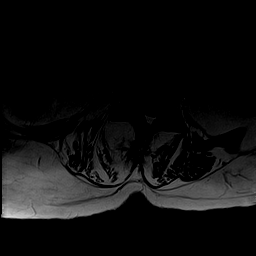
[im 22/37]
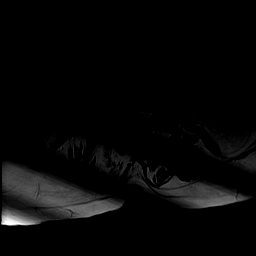
[im 26/37]
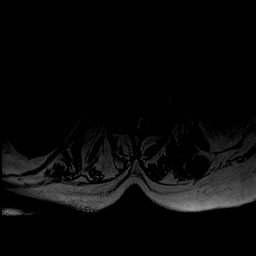
[im 29/37]
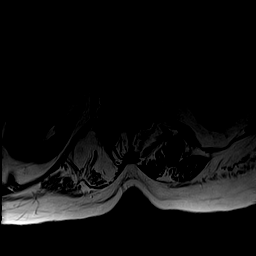
[im 37/37]
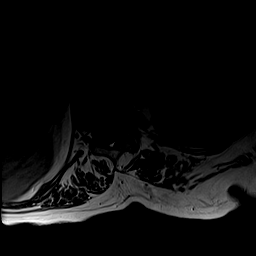

[Series 8: T2 · axial · 4.0mm · 0.70mm/px · z∈[-9,+172]mm · 11 of 37 slices shown (3 of 3)]
[im 1/37]
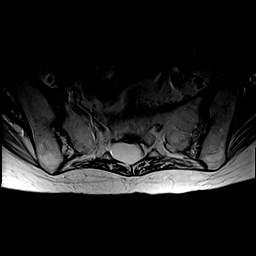
[im 4/37]
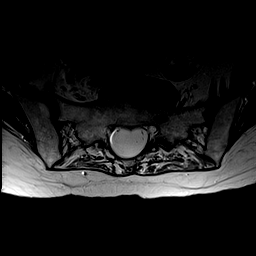
[im 8/37]
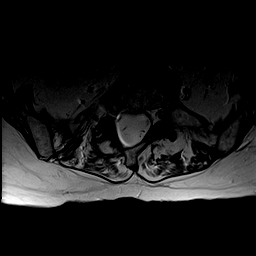
[im 11/37]
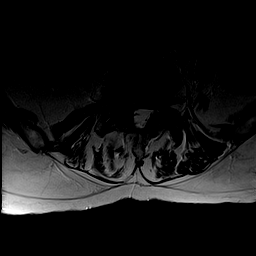
[im 15/37]
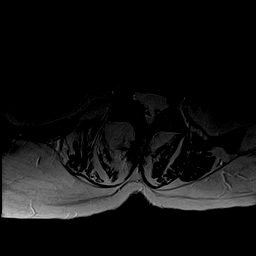
[im 19/37]
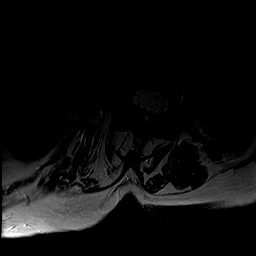
[im 22/37]
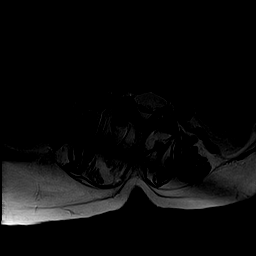
[im 26/37]
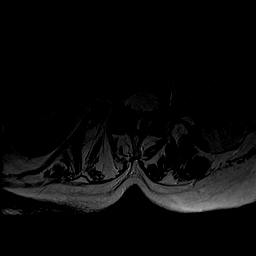
[im 29/37]
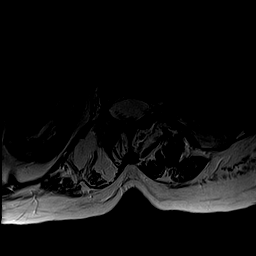
[im 33/37]
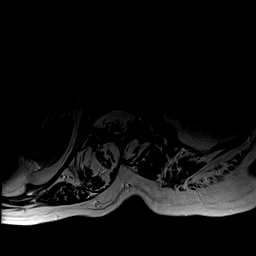
[im 37/37]
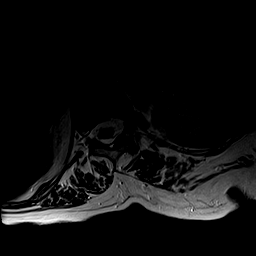

[44 of 48 positions shown; findings below may reference images not displayed]

FINDINGS: Segmentation:  Normal

Alignment: 63 degrees of levoscoliosis at L3-4 with mild
progression. 8 mm anterolisthesis L4-5 unchanged.

Vertebrae:  Negative for fracture or mass.

Conus medullaris and cauda equina: Conus extends to the L1 level.
Conus and cauda equina appear normal.

Paraspinal and other soft tissues: Negative for paraspinous mass or
fluid collection

Disc levels:

T12-L1: Mild disc and facet degeneration without stenosis

L1-2: Disc degeneration and spurring right greater than left.
Moderate right foraminal encroachment unchanged. Spinal canal normal
in size

L2-3: Disc degeneration and facet degeneration. No significant
stenosis

L3-4: Disc degeneration and spurring right greater than left.
Bilateral facet hypertrophy. No significant spinal or foraminal
stenosis

L4-5: 8 mm anterolisthesis. Marked disc degeneration and marked
facet degeneration. Mild spinal stenosis. Moderate to severe
subarticular and foraminal stenosis on the right appears unchanged.
Mild subarticular stenosis on the left

L5-S1: Moderate disc and facet degeneration. Mild subarticular
stenosis bilaterally due to spurring.
IMPRESSION: 1. 63 degrees of levoscoliosis at L3-4 with mild progression
2. Multilevel degenerative changes in the lumbar spine are similar
to the prior study. 8 mm anterolisthesis L4-5 unchanged with
moderate to severe subarticular foraminal stenosis on the right.

## 2019-02-04 DIAGNOSIS — Z0000 Encounter for general adult medical examination without abnormal findings: Secondary | ICD-10-CM | POA: Diagnosis not present

## 2019-02-04 DIAGNOSIS — N390 Urinary tract infection, site not specified: Secondary | ICD-10-CM | POA: Diagnosis not present

## 2019-02-04 DIAGNOSIS — K219 Gastro-esophageal reflux disease without esophagitis: Secondary | ICD-10-CM | POA: Diagnosis not present

## 2019-02-04 DIAGNOSIS — Z23 Encounter for immunization: Secondary | ICD-10-CM | POA: Diagnosis not present

## 2019-02-04 DIAGNOSIS — E119 Type 2 diabetes mellitus without complications: Secondary | ICD-10-CM | POA: Diagnosis not present

## 2019-02-04 DIAGNOSIS — I10 Essential (primary) hypertension: Secondary | ICD-10-CM | POA: Diagnosis not present

## 2019-02-18 ENCOUNTER — Telehealth: Payer: Self-pay | Admitting: Cardiology

## 2019-02-18 NOTE — Telephone Encounter (Signed)
   Primary Cardiologist:  Minus Breeding, MD   Patient contacted.  History reviewed. Pt contacted with no answer. Left message stating that she will need to call the office if cardiac symptoms are occuring  Based on discussion, with current pandemic situation, we will be postponing this appointment for Heather Olsen.  If symptoms change, she has been instructed to contact our office.   Routing to C19 CANCEL pool for tracking (P CV DIV CV19 CANCEL) and assigning priority (2 = 6-12 wks).  Kathyrn Drown, NP  02/18/2019 10:59 AM         .

## 2019-02-20 DIAGNOSIS — N393 Stress incontinence (female) (male): Secondary | ICD-10-CM | POA: Diagnosis not present

## 2019-02-20 DIAGNOSIS — R350 Frequency of micturition: Secondary | ICD-10-CM | POA: Diagnosis not present

## 2019-02-20 DIAGNOSIS — N390 Urinary tract infection, site not specified: Secondary | ICD-10-CM | POA: Diagnosis not present

## 2019-02-20 DIAGNOSIS — R351 Nocturia: Secondary | ICD-10-CM | POA: Diagnosis not present

## 2019-02-22 ENCOUNTER — Ambulatory Visit: Payer: Medicare Other | Admitting: Cardiology

## 2019-02-28 ENCOUNTER — Telehealth: Payer: Self-pay

## 2019-02-28 DIAGNOSIS — M47817 Spondylosis without myelopathy or radiculopathy, lumbosacral region: Secondary | ICD-10-CM | POA: Diagnosis not present

## 2019-02-28 DIAGNOSIS — M4120 Other idiopathic scoliosis, site unspecified: Secondary | ICD-10-CM | POA: Diagnosis not present

## 2019-02-28 DIAGNOSIS — M545 Low back pain: Secondary | ICD-10-CM | POA: Diagnosis not present

## 2019-02-28 NOTE — Telephone Encounter (Signed)
TELEPHONE CALL NOTE  Heather Olsen has been deemed a candidate for a follow-up tele-health visit to limit community exposure during the Covid-19 pandemic. I spoke with the patient via phone to ensure availability of phone/video source, confirm preferred email & phone number, and discuss instructions and expectations.  I reminded Heather Olsen to be prepared with any vital sign and/or heart rhythm information that could potentially be obtained via home monitoring, at the time of her visit. I reminded Heather Olsen to expect a phone call at the time of her visit if her visit.  Did the patient verbally acknowledge consent to treatment? Patient provided verbal consent.  Heather Olsen, [place] 02/28/2019 11:10 AM   DOWNLOADING THE WEBEX SOFTWARE TO SMARTPHONE  - If Apple, go to [employer] and type in WebEx in the search bar. [place] [employer], the blue/green circle. The app is free but as with any other app downloads, their phone may require them to verify saved payment information or Apple password. The patient does NOT have to create an account.  - If Android, ask patient to go to [employer] and type in WebEx in the search bar. [place] [employer], the blue/green circle. The app is free but as with any other app downloads, their phone may require them to verify saved payment information or Android password. The patient does NOT have to create an account.   CONSENT FOR TELE-HEALTH VISIT - PLEASE REVIEW  I hereby voluntarily request, consent and authorize [facility] and its employed or contracted physicians, physician assistants, nurse practitioners or other licensed health care professionals (the Practitioner), to provide me with telemedicine health care services (the "Services") as deemed necessary by the treating Practitioner. I acknowledge and consent to receive the Services by the Practitioner via telemedicine. I understand that the telemedicine visit will involve  communicating with the Practitioner through live audiovisual communication technology and the disclosure of certain medical information by electronic transmission. I acknowledge that I have been given the opportunity to request an in-person assessment or other available alternative prior to the telemedicine visit and am voluntarily participating in the telemedicine visit.  I understand that I have the right to withhold or withdraw my consent to the use of telemedicine in the course of my care at any time, without affecting my right to future care or treatment, and that the Practitioner or I may terminate the telemedicine visit at any time. I understand that I have the right to inspect all information obtained and/or recorded in the course of the telemedicine visit and may receive copies of available information for a reasonable fee.  I understand that some of the potential risks of receiving the Services via telemedicine include:  [name] Delay or interruption in medical evaluation due to technological equipment failure or disruption; . Information transmitted may not be sufficient (e.g. poor resolution of images) to allow for appropriate medical decision making by the Practitioner; and/or  . In rare instances, security protocols could fail, causing a breach of personal health information.  Furthermore, I acknowledge that it is my responsibility to provide information about my medical history, conditions and care that is complete and accurate to the best of my ability. I acknowledge that Practitioner's advice, recommendations, and/or decision may be based on factors not within their control, such as incomplete or inaccurate data provided by me or distortions of diagnostic images or specimens that may result from electronic transmissions. I understand that the practice of medicine is  not an Chief Strategy Officer and that Practitioner makes no warranties or guarantees regarding treatment outcomes. I acknowledge that I will  receive a copy of this consent concurrently upon execution via email to the email address I last provided but may also request a printed copy by calling the office of [facility].    I understand that my insurance will be billed for this visit.   I have read or had this consent read to me. . I understand the contents of this consent, which adequately explains the benefits and risks of the Services being provided via telemedicine.  . I have been provided ample opportunity to ask questions regarding this consent and the Services and have had my questions answered to my satisfaction. . I give my informed consent for the services to be provided through the use of telemedicine in my medical care  By participating in this telemedicine visit I agree to the above.

## 2019-03-01 ENCOUNTER — Telehealth (INDEPENDENT_AMBULATORY_CARE_PROVIDER_SITE_OTHER): Payer: Medicare Other | Admitting: Cardiology

## 2019-03-01 ENCOUNTER — Encounter: Payer: Self-pay | Admitting: Cardiology

## 2019-03-01 ENCOUNTER — Encounter: Payer: Self-pay | Admitting: *Deleted

## 2019-03-01 VITALS — BP 117/75 | HR 77 | Temp 97.0°F | Ht 64.0 in | Wt 112.4 lb

## 2019-03-01 DIAGNOSIS — E785 Hyperlipidemia, unspecified: Secondary | ICD-10-CM | POA: Diagnosis not present

## 2019-03-01 DIAGNOSIS — I10 Essential (primary) hypertension: Secondary | ICD-10-CM | POA: Diagnosis not present

## 2019-03-01 DIAGNOSIS — I2510 Atherosclerotic heart disease of native coronary artery without angina pectoris: Secondary | ICD-10-CM | POA: Insufficient documentation

## 2019-03-01 DIAGNOSIS — M7989 Other specified soft tissue disorders: Secondary | ICD-10-CM

## 2019-03-01 NOTE — Patient Instructions (Signed)

## 2019-03-01 NOTE — Progress Notes (Signed)
Virtual Visit via Telephone Note    Evaluation Performed:  Follow-up visit  This visit type was conducted due to national recommendations for restrictions regarding the COVID-19 Pandemic (e.g. social distancing).  This format is felt to be most appropriate for this patient at this time.  All issues noted in this document were discussed and addressed.  No physical exam was performed (except for noted visual exam findings with Video Visits).  Please refer to the patient's chart (MyChart message for video visits and phone note for telephone visits) for the patient's consent to telehealth for [facility].  Date:  03/01/2019   ID:  Zauria Safira, [place] 05-Oct-1931, MRN [id number]  Patient Location:  Home address  Provider location:   [facility] office  PCP:  Deland Pretty, MD  Cardiologist:  Minus Breeding, MD  Electrophysiologist:  None   Chief Complaint:  CAD  History of Present Illness:    Heather Olsen is a 85 y.o. female who presents via audio/video conferencing for a telehealth visit today.    The patient lives at [place].  She has a history of CAD. She had stent placement in 2008 and was followed by [facility].  She had an LAD stent placed in 2008 which was a 3.8 mm Vision. She had follow-up cath in 2009 with 30%  narrowing of the stent but otherwise no disease. Stress perfusion study in 2014 was negative. She does have some tricuspid regurgitation and some mild mitral regurgitation.  Lexiscan Myoview was negative in 2017.     Since I last saw her she has done well.  The patient denies any new symptoms such as chest discomfort, neck or arm discomfort. There has been no new shortness of breath, PND or orthopnea. There have been no reported palpitations, presyncope or syncope.  The patient does not have symptoms concerning for COVID-19 infection (fever, chills, cough, or new shortness of breath).    Prior CV studies:   The following studies were reviewed today:  None  Past  Medical History:  Diagnosis Date  . Aortic stenosis 04/12/2016  . Back pain 04/12/2016  . Balance disorder 04/12/2016  . Beat, premature ventricular 01/08/2016  . Coronary artery disease   . Depression   . Diverticulosis   . Hearing loss 04/12/2016  . Hiatal hernia 04/12/2016  . HLD (hyperlipidemia) 04/12/2016  . Hyperglycemia 09/14/2016  . Hypertension   . Idiopathic scoliosis 04/12/2016  . Major depression 04/12/2016  . Mitral regurgitation   . Osteoporosis, senile 04/12/2016  . Pernicious anemia   . Premature atrial contraction   . Raynaud's disease 04/12/2016  . Scoliosis   . Spinal stenosis of lumbar region 04/12/2016  . Tricuspid valve prolapse    Past Surgical History:  Procedure Laterality Date  . APPENDECTOMY    . BREAST SURGERY    . broken wrist Right 2007  . CARPAL TUNNEL RELEASE Left 04/04/2017   Procedure: LEFT CARPAL TUNNEL RELEASE;  Surgeon: Daryll Brod, MD;  Location: [facility];  Service: Orthopedics;  Laterality: Left;  REG/FAB  . CATARACT EXTRACTION W/ INTRAOCULAR LENS  IMPLANT, BILATERAL Bilateral 2017  . COLONOSCOPY  2016  . FOOT SURGERY Right   . TONSILLECTOMY       No outpatient medications have been marked as taking for the 03/01/19 encounter (Telemedicine) with Minus Breeding, MD.     Allergies:   Sulfa antibiotics; Topiramate; Verapamil; Zolpidem; Prevacid [lansoprazole]; Ace inhibitors; Codeine; and Ibandronic acid   Social History   Tobacco Use  .  Smoking status: Never Smoker  . Smokeless tobacco: Never Used  Substance Use Topics  . Alcohol use: Yes    Comment: wine 3-4 times a week  . Drug use: No     Family Hx: The patient's family history includes HIV (age of onset: 51) in her daughter; Heart disease in her father and mother.  ROS:   Please see the history of present illness.    Positive for severe back pain All other systems reviewed and are negative.   Labs/Other Tests and Data Reviewed:    Recent Labs: No results  found for requested labs within last 8760 hours.   Recent Lipid Panel Lab Results  Component Value Date/Time   CHOL 232 (H) 09/14/2016 02:15 PM   TRIG 168 (H) 09/14/2016 02:15 PM   HDL 99 09/14/2016 02:15 PM   CHOLHDL 2.3 09/14/2016 02:15 PM   LDLCALC 99 09/14/2016 02:15 PM    Wt Readings from Last 3 Encounters:  03/01/19 112 lb 6.4 oz (51 kg)  02/22/18 115 lb 3.2 oz (52.3 kg)  04/04/17 115 lb (52.2 kg)     Objective:    Vital Signs:  BP 117/75   Pulse 77   Temp (!) 97 F (36.1 C)   Ht 5\' 4"  (1.626 m)   Wt 112 lb 6.4 oz (51 kg)   BMI 19.29 kg/m    ASSESSMENT & PLAN:    CAD:  She had a negative Lexiscan Myoview in December of 2017.   She is had no new symptoms since then.  No further cardiovascular testing is suggested.  PALPITATIONS:   She has had no significant arrhythmias.  She had infrequent ectopy on Holter last year.  No change in therapy is indicated.  AORTIC STENOSIS:    She has had mild AS and MR.   at this point no further imaging but I will listen to her again in 6 months.  I would not suggest symptomatically by her history that she has had any significant progression.   HTN: The blood pressure is  running occasionally low.  We discussed this as below.   DYSLIPIDEMIA:   I don't have the most recent labs and will ask that these be sent from her PCP.  She says that they were very well controlled.  LEG SWELLING: She was recently started on triamterene hydrochlorothiazide for this.  I told her to take this really only as needed and that it was best to avoid therapy unless she was having uncomfortable leg swelling and in particular like she was having shortness of breath.  She has been having some low blood pressures and I told her definitely not to take this on the days that she notices this.  COVID-19 Education: The signs and symptoms of COVID-19 were discussed with the patient and how to seek care for testing (follow up with PCP or arrange E-visit).  The  importance of social distancing was discussed today.  Patient Risk:   After full review of this patient's clinical status, I feel that they are at least moderate risk at this time.  Time:   Today, I have spent 21 minutes with the patient with telehealth technology discussing the above.     Medication Adjustments/Labs and Tests Ordered: Current medicines are reviewed at length with the patient today.  Concerns regarding medicines are outlined above.  Tests Ordered: No orders of the defined types were placed in this encounter.  Medication Changes: No orders of the defined types were placed in this  encounter.   Disposition:  Follow up in 6 month(s)  Signed, Minus Breeding, MD  03/01/2019 3:01 PM    [facility]

## 2019-03-07 DIAGNOSIS — N281 Cyst of kidney, acquired: Secondary | ICD-10-CM | POA: Diagnosis not present

## 2019-03-07 DIAGNOSIS — N390 Urinary tract infection, site not specified: Secondary | ICD-10-CM | POA: Diagnosis not present

## 2019-03-07 DIAGNOSIS — R351 Nocturia: Secondary | ICD-10-CM | POA: Diagnosis not present

## 2019-03-18 DIAGNOSIS — M5137 Other intervertebral disc degeneration, lumbosacral region: Secondary | ICD-10-CM | POA: Diagnosis not present

## 2019-03-18 DIAGNOSIS — M4120 Other idiopathic scoliosis, site unspecified: Secondary | ICD-10-CM | POA: Diagnosis not present

## 2019-03-18 DIAGNOSIS — M545 Low back pain: Secondary | ICD-10-CM | POA: Diagnosis not present

## 2019-03-19 ENCOUNTER — Ambulatory Visit: Payer: Medicare Other | Admitting: Cardiology

## 2019-04-09 DIAGNOSIS — E538 Deficiency of other specified B group vitamins: Secondary | ICD-10-CM | POA: Diagnosis not present

## 2019-05-07 DIAGNOSIS — E119 Type 2 diabetes mellitus without complications: Secondary | ICD-10-CM | POA: Diagnosis not present

## 2019-05-07 DIAGNOSIS — E7800 Pure hypercholesterolemia, unspecified: Secondary | ICD-10-CM | POA: Diagnosis not present

## 2019-05-07 DIAGNOSIS — Z955 Presence of coronary angioplasty implant and graft: Secondary | ICD-10-CM | POA: Diagnosis not present

## 2019-05-07 DIAGNOSIS — I10 Essential (primary) hypertension: Secondary | ICD-10-CM | POA: Diagnosis not present

## 2019-05-07 DIAGNOSIS — H00012 Hordeolum externum right lower eyelid: Secondary | ICD-10-CM | POA: Diagnosis not present

## 2019-05-07 DIAGNOSIS — Z8659 Personal history of other mental and behavioral disorders: Secondary | ICD-10-CM | POA: Diagnosis not present

## 2019-05-07 DIAGNOSIS — M545 Low back pain: Secondary | ICD-10-CM | POA: Diagnosis not present

## 2019-05-30 DIAGNOSIS — N393 Stress incontinence (female) (male): Secondary | ICD-10-CM | POA: Diagnosis not present

## 2019-05-30 DIAGNOSIS — R351 Nocturia: Secondary | ICD-10-CM | POA: Diagnosis not present

## 2019-06-07 DIAGNOSIS — M7918 Myalgia, other site: Secondary | ICD-10-CM | POA: Diagnosis not present

## 2019-06-07 DIAGNOSIS — R079 Chest pain, unspecified: Secondary | ICD-10-CM | POA: Diagnosis not present

## 2019-06-12 DIAGNOSIS — F419 Anxiety disorder, unspecified: Secondary | ICD-10-CM | POA: Diagnosis not present

## 2019-06-25 DIAGNOSIS — M8580 Other specified disorders of bone density and structure, unspecified site: Secondary | ICD-10-CM | POA: Diagnosis not present

## 2019-06-25 DIAGNOSIS — M5136 Other intervertebral disc degeneration, lumbar region: Secondary | ICD-10-CM | POA: Insufficient documentation

## 2019-06-25 DIAGNOSIS — M4310 Spondylolisthesis, site unspecified: Secondary | ICD-10-CM | POA: Insufficient documentation

## 2019-06-25 DIAGNOSIS — M545 Low back pain: Secondary | ICD-10-CM | POA: Diagnosis not present

## 2019-06-25 DIAGNOSIS — M419 Scoliosis, unspecified: Secondary | ICD-10-CM | POA: Diagnosis not present

## 2019-07-17 DIAGNOSIS — R2681 Unsteadiness on feet: Secondary | ICD-10-CM | POA: Diagnosis not present

## 2019-07-17 DIAGNOSIS — Z955 Presence of coronary angioplasty implant and graft: Secondary | ICD-10-CM | POA: Diagnosis not present

## 2019-07-17 DIAGNOSIS — I340 Nonrheumatic mitral (valve) insufficiency: Secondary | ICD-10-CM | POA: Diagnosis not present

## 2019-07-17 DIAGNOSIS — I361 Nonrheumatic tricuspid (valve) insufficiency: Secondary | ICD-10-CM | POA: Diagnosis not present

## 2019-07-17 DIAGNOSIS — M4800 Spinal stenosis, site unspecified: Secondary | ICD-10-CM | POA: Diagnosis not present

## 2019-07-17 DIAGNOSIS — M545 Low back pain: Secondary | ICD-10-CM | POA: Diagnosis not present

## 2019-07-17 DIAGNOSIS — K5791 Diverticulosis of intestine, part unspecified, without perforation or abscess with bleeding: Secondary | ICD-10-CM | POA: Diagnosis not present

## 2019-07-17 DIAGNOSIS — M419 Scoliosis, unspecified: Secondary | ICD-10-CM | POA: Diagnosis not present

## 2019-07-17 DIAGNOSIS — M6281 Muscle weakness (generalized): Secondary | ICD-10-CM | POA: Diagnosis not present

## 2019-07-17 DIAGNOSIS — D510 Vitamin B12 deficiency anemia due to intrinsic factor deficiency: Secondary | ICD-10-CM | POA: Diagnosis not present

## 2019-07-22 DIAGNOSIS — I340 Nonrheumatic mitral (valve) insufficiency: Secondary | ICD-10-CM | POA: Diagnosis not present

## 2019-07-22 DIAGNOSIS — M6281 Muscle weakness (generalized): Secondary | ICD-10-CM | POA: Diagnosis not present

## 2019-07-22 DIAGNOSIS — M4800 Spinal stenosis, site unspecified: Secondary | ICD-10-CM | POA: Diagnosis not present

## 2019-07-22 DIAGNOSIS — M545 Low back pain: Secondary | ICD-10-CM | POA: Diagnosis not present

## 2019-07-22 DIAGNOSIS — R2681 Unsteadiness on feet: Secondary | ICD-10-CM | POA: Diagnosis not present

## 2019-07-22 DIAGNOSIS — M419 Scoliosis, unspecified: Secondary | ICD-10-CM | POA: Diagnosis not present

## 2019-07-23 DIAGNOSIS — M810 Age-related osteoporosis without current pathological fracture: Secondary | ICD-10-CM | POA: Diagnosis not present

## 2019-07-23 DIAGNOSIS — Z8659 Personal history of other mental and behavioral disorders: Secondary | ICD-10-CM | POA: Diagnosis not present

## 2019-07-23 DIAGNOSIS — R232 Flushing: Secondary | ICD-10-CM | POA: Diagnosis not present

## 2019-07-23 DIAGNOSIS — Z79899 Other long term (current) drug therapy: Secondary | ICD-10-CM | POA: Diagnosis not present

## 2019-07-23 DIAGNOSIS — D519 Vitamin B12 deficiency anemia, unspecified: Secondary | ICD-10-CM | POA: Diagnosis not present

## 2019-07-24 DIAGNOSIS — M6281 Muscle weakness (generalized): Secondary | ICD-10-CM | POA: Diagnosis not present

## 2019-07-24 DIAGNOSIS — R2681 Unsteadiness on feet: Secondary | ICD-10-CM | POA: Diagnosis not present

## 2019-07-24 DIAGNOSIS — I340 Nonrheumatic mitral (valve) insufficiency: Secondary | ICD-10-CM | POA: Diagnosis not present

## 2019-07-24 DIAGNOSIS — M4800 Spinal stenosis, site unspecified: Secondary | ICD-10-CM | POA: Diagnosis not present

## 2019-07-24 DIAGNOSIS — M419 Scoliosis, unspecified: Secondary | ICD-10-CM | POA: Diagnosis not present

## 2019-07-24 DIAGNOSIS — M545 Low back pain: Secondary | ICD-10-CM | POA: Diagnosis not present

## 2019-07-29 DIAGNOSIS — R2681 Unsteadiness on feet: Secondary | ICD-10-CM | POA: Diagnosis not present

## 2019-07-29 DIAGNOSIS — M4800 Spinal stenosis, site unspecified: Secondary | ICD-10-CM | POA: Diagnosis not present

## 2019-07-29 DIAGNOSIS — I340 Nonrheumatic mitral (valve) insufficiency: Secondary | ICD-10-CM | POA: Diagnosis not present

## 2019-07-29 DIAGNOSIS — M419 Scoliosis, unspecified: Secondary | ICD-10-CM | POA: Diagnosis not present

## 2019-07-29 DIAGNOSIS — M545 Low back pain: Secondary | ICD-10-CM | POA: Diagnosis not present

## 2019-07-29 DIAGNOSIS — M6281 Muscle weakness (generalized): Secondary | ICD-10-CM | POA: Diagnosis not present

## 2019-07-31 DIAGNOSIS — K5791 Diverticulosis of intestine, part unspecified, without perforation or abscess with bleeding: Secondary | ICD-10-CM | POA: Diagnosis not present

## 2019-07-31 DIAGNOSIS — R2681 Unsteadiness on feet: Secondary | ICD-10-CM | POA: Diagnosis not present

## 2019-07-31 DIAGNOSIS — M4800 Spinal stenosis, site unspecified: Secondary | ICD-10-CM | POA: Diagnosis not present

## 2019-07-31 DIAGNOSIS — Z955 Presence of coronary angioplasty implant and graft: Secondary | ICD-10-CM | POA: Diagnosis not present

## 2019-07-31 DIAGNOSIS — I340 Nonrheumatic mitral (valve) insufficiency: Secondary | ICD-10-CM | POA: Diagnosis not present

## 2019-07-31 DIAGNOSIS — M6281 Muscle weakness (generalized): Secondary | ICD-10-CM | POA: Diagnosis not present

## 2019-07-31 DIAGNOSIS — I361 Nonrheumatic tricuspid (valve) insufficiency: Secondary | ICD-10-CM | POA: Diagnosis not present

## 2019-07-31 DIAGNOSIS — M419 Scoliosis, unspecified: Secondary | ICD-10-CM | POA: Diagnosis not present

## 2019-07-31 DIAGNOSIS — M545 Low back pain: Secondary | ICD-10-CM | POA: Diagnosis not present

## 2019-07-31 DIAGNOSIS — D510 Vitamin B12 deficiency anemia due to intrinsic factor deficiency: Secondary | ICD-10-CM | POA: Diagnosis not present

## 2019-08-09 DIAGNOSIS — I340 Nonrheumatic mitral (valve) insufficiency: Secondary | ICD-10-CM | POA: Diagnosis not present

## 2019-08-09 DIAGNOSIS — M6281 Muscle weakness (generalized): Secondary | ICD-10-CM | POA: Diagnosis not present

## 2019-08-09 DIAGNOSIS — M419 Scoliosis, unspecified: Secondary | ICD-10-CM | POA: Diagnosis not present

## 2019-08-09 DIAGNOSIS — R2681 Unsteadiness on feet: Secondary | ICD-10-CM | POA: Diagnosis not present

## 2019-08-09 DIAGNOSIS — M545 Low back pain: Secondary | ICD-10-CM | POA: Diagnosis not present

## 2019-08-09 DIAGNOSIS — M4800 Spinal stenosis, site unspecified: Secondary | ICD-10-CM | POA: Diagnosis not present

## 2019-08-12 DIAGNOSIS — R2681 Unsteadiness on feet: Secondary | ICD-10-CM | POA: Diagnosis not present

## 2019-08-12 DIAGNOSIS — M545 Low back pain: Secondary | ICD-10-CM | POA: Diagnosis not present

## 2019-08-12 DIAGNOSIS — M6281 Muscle weakness (generalized): Secondary | ICD-10-CM | POA: Diagnosis not present

## 2019-08-12 DIAGNOSIS — I340 Nonrheumatic mitral (valve) insufficiency: Secondary | ICD-10-CM | POA: Diagnosis not present

## 2019-08-12 DIAGNOSIS — M4800 Spinal stenosis, site unspecified: Secondary | ICD-10-CM | POA: Diagnosis not present

## 2019-08-12 DIAGNOSIS — M419 Scoliosis, unspecified: Secondary | ICD-10-CM | POA: Diagnosis not present

## 2019-08-13 DIAGNOSIS — I10 Essential (primary) hypertension: Secondary | ICD-10-CM | POA: Diagnosis not present

## 2019-08-13 DIAGNOSIS — E118 Type 2 diabetes mellitus with unspecified complications: Secondary | ICD-10-CM | POA: Diagnosis not present

## 2019-08-13 DIAGNOSIS — M810 Age-related osteoporosis without current pathological fracture: Secondary | ICD-10-CM | POA: Diagnosis not present

## 2019-08-13 DIAGNOSIS — E559 Vitamin D deficiency, unspecified: Secondary | ICD-10-CM | POA: Diagnosis not present

## 2019-08-14 DIAGNOSIS — M4800 Spinal stenosis, site unspecified: Secondary | ICD-10-CM | POA: Diagnosis not present

## 2019-08-14 DIAGNOSIS — M545 Low back pain: Secondary | ICD-10-CM | POA: Diagnosis not present

## 2019-08-14 DIAGNOSIS — M6281 Muscle weakness (generalized): Secondary | ICD-10-CM | POA: Diagnosis not present

## 2019-08-14 DIAGNOSIS — M419 Scoliosis, unspecified: Secondary | ICD-10-CM | POA: Diagnosis not present

## 2019-08-14 DIAGNOSIS — R2681 Unsteadiness on feet: Secondary | ICD-10-CM | POA: Diagnosis not present

## 2019-08-14 DIAGNOSIS — I340 Nonrheumatic mitral (valve) insufficiency: Secondary | ICD-10-CM | POA: Diagnosis not present

## 2019-08-19 DIAGNOSIS — M545 Low back pain: Secondary | ICD-10-CM | POA: Diagnosis not present

## 2019-08-19 DIAGNOSIS — M6281 Muscle weakness (generalized): Secondary | ICD-10-CM | POA: Diagnosis not present

## 2019-08-19 DIAGNOSIS — R2681 Unsteadiness on feet: Secondary | ICD-10-CM | POA: Diagnosis not present

## 2019-08-19 DIAGNOSIS — I340 Nonrheumatic mitral (valve) insufficiency: Secondary | ICD-10-CM | POA: Diagnosis not present

## 2019-08-19 DIAGNOSIS — M419 Scoliosis, unspecified: Secondary | ICD-10-CM | POA: Diagnosis not present

## 2019-08-19 DIAGNOSIS — M4800 Spinal stenosis, site unspecified: Secondary | ICD-10-CM | POA: Diagnosis not present

## 2019-08-20 DIAGNOSIS — F419 Anxiety disorder, unspecified: Secondary | ICD-10-CM | POA: Diagnosis not present

## 2019-08-20 DIAGNOSIS — E119 Type 2 diabetes mellitus without complications: Secondary | ICD-10-CM | POA: Diagnosis not present

## 2019-08-20 DIAGNOSIS — M545 Low back pain: Secondary | ICD-10-CM | POA: Diagnosis not present

## 2019-08-20 DIAGNOSIS — I10 Essential (primary) hypertension: Secondary | ICD-10-CM | POA: Diagnosis not present

## 2019-08-20 DIAGNOSIS — M810 Age-related osteoporosis without current pathological fracture: Secondary | ICD-10-CM | POA: Diagnosis not present

## 2019-08-20 DIAGNOSIS — K219 Gastro-esophageal reflux disease without esophagitis: Secondary | ICD-10-CM | POA: Diagnosis not present

## 2019-08-20 DIAGNOSIS — I2510 Atherosclerotic heart disease of native coronary artery without angina pectoris: Secondary | ICD-10-CM | POA: Diagnosis not present

## 2019-08-20 DIAGNOSIS — R748 Abnormal levels of other serum enzymes: Secondary | ICD-10-CM | POA: Diagnosis not present

## 2019-08-20 DIAGNOSIS — Z955 Presence of coronary angioplasty implant and graft: Secondary | ICD-10-CM | POA: Diagnosis not present

## 2019-08-21 DIAGNOSIS — I340 Nonrheumatic mitral (valve) insufficiency: Secondary | ICD-10-CM | POA: Diagnosis not present

## 2019-08-21 DIAGNOSIS — M4800 Spinal stenosis, site unspecified: Secondary | ICD-10-CM | POA: Diagnosis not present

## 2019-08-21 DIAGNOSIS — M6281 Muscle weakness (generalized): Secondary | ICD-10-CM | POA: Diagnosis not present

## 2019-08-21 DIAGNOSIS — R2681 Unsteadiness on feet: Secondary | ICD-10-CM | POA: Diagnosis not present

## 2019-08-21 DIAGNOSIS — M419 Scoliosis, unspecified: Secondary | ICD-10-CM | POA: Diagnosis not present

## 2019-08-21 DIAGNOSIS — M545 Low back pain: Secondary | ICD-10-CM | POA: Diagnosis not present

## 2019-08-26 DIAGNOSIS — I340 Nonrheumatic mitral (valve) insufficiency: Secondary | ICD-10-CM | POA: Diagnosis not present

## 2019-08-26 DIAGNOSIS — M4800 Spinal stenosis, site unspecified: Secondary | ICD-10-CM | POA: Diagnosis not present

## 2019-08-26 DIAGNOSIS — M419 Scoliosis, unspecified: Secondary | ICD-10-CM | POA: Diagnosis not present

## 2019-08-26 DIAGNOSIS — M545 Low back pain: Secondary | ICD-10-CM | POA: Diagnosis not present

## 2019-08-26 DIAGNOSIS — M6281 Muscle weakness (generalized): Secondary | ICD-10-CM | POA: Diagnosis not present

## 2019-08-26 DIAGNOSIS — R2681 Unsteadiness on feet: Secondary | ICD-10-CM | POA: Diagnosis not present

## 2019-08-26 NOTE — Progress Notes (Signed)
Cardiology Office Note   Date:  08/27/2019   ID:  Heather Olsen 1931/08/17, MRN [id number]  PCP:  Deland Pretty, MD  Cardiologist:   Minus Breeding, MD   Chief Complaint  Patient presents with   Coronary Artery Disease      History of Present Illness: Heather Olsen is a 85 y.o. female who presents for follow up of CAD. She had stent placement in 2008 and was followed by [facility].  She had an LAD stent placed in 2008 which was a 3.8 mm Vision. She had follow-up cath in 2009 with 30%  narrowing of the stent but otherwise no disease. Stress perfusion study in 2014 was negative. She does have some tricuspid regurgitation and some mild mitral regurgitation.  Lexiscan Myoview was negative in 2017.     Since I last saw her she has not had any of the lower extremity swelling and has not been taking her triamterene hydrochlorothiazide.  She has been working with physical therapy.  With bothers her is that sporadically she gets short of breath.  As part of her therapy she walks down the hallway.  Sometimes she can do this without any breathlessness and at other times she is short of breath doing the same activity.  She denies any chest pressure, neck or arm discomfort.  She does not notice her heart racing or skipping at those times.  She does not have lower extremity swelling.  She has none of the symptoms that happened with her previous coronary disease.  Past Medical History:  Diagnosis Date   Aortic stenosis 04/12/2016   Back pain 04/12/2016   Balance disorder 04/12/2016   Beat, premature ventricular 01/08/2016   Coronary artery disease    Depression    Diverticulosis    Hearing loss 04/12/2016   Hiatal hernia 04/12/2016   HLD (hyperlipidemia) 04/12/2016   Hyperglycemia 09/14/2016   Hypertension    Idiopathic scoliosis 04/12/2016   Major depression 04/12/2016   Mitral regurgitation    Osteoporosis, senile 04/12/2016   Pernicious anemia    Premature atrial  contraction    Raynaud's disease 04/12/2016   Scoliosis    Spinal stenosis of lumbar region 04/12/2016   Tricuspid valve prolapse     Past Surgical History:  Procedure Laterality Date   APPENDECTOMY     BREAST SURGERY     broken wrist Right 2007   CARPAL TUNNEL RELEASE Left 04/04/2017   Procedure: LEFT CARPAL TUNNEL RELEASE;  Surgeon: Daryll Brod, MD;  Location: [facility];  Service: Orthopedics;  Laterality: Left;  REG/FAB   CATARACT EXTRACTION W/ INTRAOCULAR LENS  IMPLANT, BILATERAL Bilateral 2017   COLONOSCOPY  2016   FOOT SURGERY Right    TONSILLECTOMY       Current Outpatient Medications  Medication Sig Dispense Refill   acetaminophen (TYLENOL) 650 MG CR tablet Take 650 mg by mouth every 8 (eight) hours as needed for pain.     amLODipine (NORVASC) 5 MG tablet Take 2.5 mg by mouth daily.     aspirin 81 MG chewable tablet Chew 81 mg by mouth. Take one daily     celecoxib (CELEBREX) 200 MG capsule TAKE 1 CAPSULE BY MOUTH EVERY DAY 30 capsule 0   Cholecalciferol (VITAMIN D3) 2000 units capsule Take 2,000 Units by mouth daily.      clonazePAM (KLONOPIN) 0.5 MG tablet 0.25 mg.      cyanocobalamin (,VITAMIN B-12,) 1000 MCG/ML injection Inject 1 each into the skin  every 30 (thirty) days.     DULoxetine (CYMBALTA) 60 MG capsule Take 1 capsule by mouth daily.     esomeprazole (NEXIUM) 40 MG capsule TAKE ONE CAPSULE BY MOUTH DAILY FOR STOMACH 30 capsule 2   ipratropium (ATROVENT) 0.06 % nasal spray Use 2 sprays in each nostril up to three times daily before meals for sinus drainage. 30 mL 3   Lactobacillus-Inulin (CULTURELLE DIGESTIVE HEALTH PO) Take by mouth. Take one daily for probiotic     metoprolol succinate (TOPROL-XL) 25 MG 24 hr tablet Take 25 mg by mouth daily.     MULTIPLE VITAMINS-MINERALS PO Take 1 tablet by mouth daily.      nitroGLYCERIN (NITROSTAT) 0.4 MG SL tablet Place under the tongue. Place one tablet under the tongue every 5  minutes as needed for chest pain. No more than 3     rOPINIRole (REQUIP) 0.5 MG tablet Take 0.25 mg by mouth daily.     traMADol (ULTRAM) 50 MG tablet TAKE 1 TABLET BY MOUTH EVERY 4 HOURS 75 tablet 0   triamterene-hydrochlorothiazide (MAXZIDE-25) 37.5-25 MG tablet Take 0.5 tablets by mouth daily.     Turmeric 500 MG CAPS Take 1,000 mg by mouth daily.     No current facility-administered medications for this visit.     Allergies:   Sulfa antibiotics, Topiramate, Verapamil, Zolpidem, Prevacid [lansoprazole], Ace inhibitors, Codeine, and Ibandronic acid    ROS:  Please see the history of present illness.   Otherwise, review of systems are positive for none.   All other systems are reviewed and negative.    PHYSICAL EXAM: VS:  BP 108/68    Pulse 63    Ht 5\' 3"  (1.6 m)    Wt 111 lb 6.4 oz (50.5 kg)    BMI 19.73 kg/m  , BMI Body mass index is 19.73 kg/m.  GENERAL:  Well appearing NECK:  No jugular venous distention, waveform within normal limits, carotid upstroke brisk and symmetric, no bruits, no thyromegaly LUNGS:  Clear to auscultation bilaterally CHEST:  Unremarkable, significant kyphoscoliosis HEART:  PMI not displaced or sustained,S1 and S2 within normal limits, no S3, no S4, no clicks, no rubs, no murmurs ABD:  Flat, positive bowel sounds normal in frequency in pitch, no bruits, no rebound, no guarding, no midline pulsatile mass, no hepatomegaly, no splenomegaly EXT:  2 plus pulses throughout, no edema, no cyanosis no clubbing   EKG:  EKG is  ordered today. The ekg ordered today demonstrates sinus bradycardia, rate 63, left axis deviation, left anterior fascicular block, RSR prime V1 and V2, poor anterior R wave progression, first-degree AV block, no acute ST-T wave changes.     Recent Labs: No results found for requested labs within last 8760 hours.    Lipid Panel    Component Value Date/Time   CHOL 232 (H) 09/14/2016 1415   TRIG 168 (H) 09/14/2016 1415   HDL 99  09/14/2016 1415   CHOLHDL 2.3 09/14/2016 1415   VLDL 34 (H) 09/14/2016 1415   LDLCALC 99 09/14/2016 1415      Wt Readings from Last 3 Encounters:  08/27/19 111 lb 6.4 oz (50.5 kg)  03/01/19 112 lb 6.4 oz (51 kg)  02/22/18 115 lb 3.2 oz (52.3 kg)      Other studies Reviewed: Additional studies/ records that were reviewed today include: Labs Review of the above records demonstrates:    ASSESSMENT AND PLAN:  CAD: She had a negative Lexiscan Myoview in December of 2017.   I  do not suspect she is having an ischemia equivalent although we will keep this in mind given the description of the dyspnea below.  PALPITATIONS:  She is not having any tachypalpitations.  However, with her dyspnea and her conduction disturbances I wonder if there could be some rhythm such as a bradycardia arrhythmia contributing.  She needs a 2-week event monitor.  AORTIC STENOSIS:She has had mild AS and MR. I would not suspect this to be the etiology of her breathlessness.  No change in therapy.  HTN:The blood pressure isat target.  No change in therapy.  DYSPNEA:   Certainly some of this is related to her bony abnormalities.  She also has muscle loss and some probable deconditioning.  However, the episodic nature makes me wonder if this could be some mild diastolic dysfunction.  Of asked her to pay attention to salt and weights to see if there is any correlation.  Also check an event monitor as above.    Current medicines are reviewed at length with the patient today.  The patient does not have  concerns regarding medicines.  The following changes have been made:  None  Labs/ tests ordered today include:      Orders Placed This Encounter  Procedures   LONG TERM MONITOR-LIVE TELEMETRY (3-14 DAYS)   EKG 12-Lead     Disposition:   FU with me in 6 months.     Signed, Minus Breeding, MD  08/27/2019 4:21 PM    [facility]

## 2019-08-27 ENCOUNTER — Encounter: Payer: Self-pay | Admitting: Cardiology

## 2019-08-27 ENCOUNTER — Other Ambulatory Visit: Payer: Self-pay

## 2019-08-27 ENCOUNTER — Ambulatory Visit: Payer: Medicare Other | Admitting: Cardiology

## 2019-08-27 ENCOUNTER — Ambulatory Visit (INDEPENDENT_AMBULATORY_CARE_PROVIDER_SITE_OTHER): Payer: Medicare Other | Admitting: Cardiology

## 2019-08-27 VITALS — BP 108/68 | HR 63 | Ht 63.0 in | Wt 111.4 lb

## 2019-08-27 DIAGNOSIS — R0602 Shortness of breath: Secondary | ICD-10-CM | POA: Diagnosis not present

## 2019-08-27 DIAGNOSIS — M7989 Other specified soft tissue disorders: Secondary | ICD-10-CM | POA: Diagnosis not present

## 2019-08-27 DIAGNOSIS — E785 Hyperlipidemia, unspecified: Secondary | ICD-10-CM | POA: Diagnosis not present

## 2019-08-27 DIAGNOSIS — I10 Essential (primary) hypertension: Secondary | ICD-10-CM | POA: Diagnosis not present

## 2019-08-27 DIAGNOSIS — R002 Palpitations: Secondary | ICD-10-CM

## 2019-08-27 DIAGNOSIS — I2510 Atherosclerotic heart disease of native coronary artery without angina pectoris: Secondary | ICD-10-CM | POA: Diagnosis not present

## 2019-08-27 NOTE — Patient Instructions (Addendum)
Medication Instructions:  Your physician recommends that you continue on your current medications as directed. Please refer to the Current Medication list given to you today.  If you need a refill on your cardiac medications before your next appointment, please call your pharmacy.   Lab work: NONE  Testing/Procedures: Your physician has recommended that you wear an event monitor. Event monitors are medical devices that record the heart's electrical activity. Doctors most often [place] these monitors to diagnose arrhythmias. Arrhythmias are problems with the speed or rhythm of the heartbeat. The monitor is a small, portable device. You can wear one while you do your normal daily activities. This is usually used to diagnose what is causing palpitations/syncope (passing out).    Follow-Up: At [facility], you and your health needs are our priority.  As part of our continuing mission to provide you with exceptional heart care, we have created designated Provider Care Teams.  These Care Teams include your primary Cardiologist (physician) and Advanced Practice Providers (APPs -  Physician Assistants and Nurse Practitioners) who all work together to provide you with the care you need, when you need it. You will need a follow up appointment in 6 months.  Please call our office 2 months in advance to schedule this appointment.  You may see Minus Breeding, MD or one of the following Advanced Practice Providers on your designated Care Team:   Rosaria Ferries, PA-C Jory Sims, DNP, ANP         Ambulatory Cardiac Monitoring An ambulatory cardiac monitor is a small recording device that is used to detect abnormal heart rhythms (arrhythmias). Most monitors are connected by wires to flat, sticky disks (electrodes) that are then attached to your chest. You may need to wear a monitor if you have had symptoms such as:  Fast heartbeats (palpitations).  Dizziness.  Fainting or  light-headedness.  Unexplained weakness.  Shortness of breath. There are several types of monitors. Some common monitors include:  Holter monitor. This records your heart rhythm continuously, usually for 24-48 hours.  Event (episodic) monitor. This monitor has a symptoms button, and when pushed, it will begin recording. You need to activate this monitor to record when you have a heart-related symptom.  Automatic detection monitor. This monitor will begin recording when it detects an abnormal heartbeat. What are the risks? Generally, these devices are safe to use. However, it is possible that the skin under the electrodes will become irritated. How to prepare for monitoring Your health care provider will prepare your chest for the electrode placement and show you how to use the monitor.  Do not apply lotions to your chest before monitoring.  Follow directions on how to care for the monitor, and how to return the monitor when the testing period is complete. How to use your cardiac monitor  Follow directions about how long to wear the monitor, and if you can take the monitor off in order to shower or bathe. ? Do not let the monitor get wet. ? Do not bathe, swim, or use a hot tub while wearing the monitor.  Keep your skin clean. Do not put body lotion or moisturizer on your chest.  Change the electrodes as told by your health care provider, or any time they stop sticking to your skin. You may need to use medical tape to keep them on.  Try to put the electrodes in slightly different places on your chest to help prevent skin irritation. Follow directions from your health care provider about  where to place the electrodes.  Make sure the monitor is safely clipped to your clothing or in a location close to your body as recommended by your health care provider.  If your monitor has a symptoms button, press the button to mark an event as soon as you feel a heart-related symptom, such  as: ? Dizziness. ? Weakness. ? Light-headedness. ? Palpitations. ? Thumping or pounding in your chest. ? Shortness of breath. ? Unexplained weakness.  Keep a diary of your activities, such as walking, doing chores, and taking medicine. It is very important to note what you were doing when you pushed the button to record your symptoms. This will help your health care provider determine what might be contributing to your symptoms.  Send the recorded information as recommended by your health care provider. It may take some time for your health care provider to process the results.  Change the batteries as told by your health care provider.  Keep electronic devices away from your monitor. These include: ? Tablets. ? MP3 players. ? Cell phones.  While wearing your monitor you should avoid: ? Electric blankets. ? Armed forces operational officer. ? Electric toothbrushes. ? Microwave ovens. ? Magnets. ? Metal detectors. Get help right away if:  You have chest pain.  You have shortness of breath or extreme difficulty breathing.  You develop a very fast heartbeat that does not get better.  You develop dizziness that does not go away.  You faint or constantly feel like you are about to faint. Summary  An ambulatory cardiac monitor is a small recording device that is used to detect abnormal heart rhythms (arrhythmias).  Make sure you understand how to send the information from the monitor to your health care provider.  It is important to press the button on the monitor when you have any heart-related symptoms.  Keep a diary of your activities, such as walking, doing chores, and taking medicine. It is very important to note what you were doing when you pushed the button to record your symptoms. This will help your health care provider learn what might be causing your symptoms. This information is not intended to replace advice given to you by your health care provider. Make sure you discuss any  questions you have with your health care provider. Document Released: 08/23/2008 Document Revised: 10/27/2017 Document Reviewed: 10/29/2016 Elsevier Patient Education  2020 [employer].

## 2019-08-28 DIAGNOSIS — M545 Low back pain: Secondary | ICD-10-CM | POA: Diagnosis not present

## 2019-08-28 DIAGNOSIS — M419 Scoliosis, unspecified: Secondary | ICD-10-CM | POA: Diagnosis not present

## 2019-08-28 DIAGNOSIS — M4800 Spinal stenosis, site unspecified: Secondary | ICD-10-CM | POA: Diagnosis not present

## 2019-08-28 DIAGNOSIS — I340 Nonrheumatic mitral (valve) insufficiency: Secondary | ICD-10-CM | POA: Diagnosis not present

## 2019-08-28 DIAGNOSIS — M6281 Muscle weakness (generalized): Secondary | ICD-10-CM | POA: Diagnosis not present

## 2019-08-28 DIAGNOSIS — R2681 Unsteadiness on feet: Secondary | ICD-10-CM | POA: Diagnosis not present

## 2019-09-02 ENCOUNTER — Telehealth: Payer: Self-pay | Admitting: *Deleted

## 2019-09-02 NOTE — Telephone Encounter (Signed)
14 day ZIO AT long term telemetry monitor mailed to the patients home.  Instructions reviewed briefly as they are included in the monitor kit.

## 2019-09-08 ENCOUNTER — Ambulatory Visit (INDEPENDENT_AMBULATORY_CARE_PROVIDER_SITE_OTHER): Payer: Medicare Other

## 2019-09-08 DIAGNOSIS — R002 Palpitations: Secondary | ICD-10-CM

## 2019-09-08 DIAGNOSIS — R0602 Shortness of breath: Secondary | ICD-10-CM

## 2019-09-09 DIAGNOSIS — H04123 Dry eye syndrome of bilateral lacrimal glands: Secondary | ICD-10-CM | POA: Diagnosis not present

## 2019-09-09 DIAGNOSIS — Z961 Presence of intraocular lens: Secondary | ICD-10-CM | POA: Diagnosis not present

## 2019-09-09 DIAGNOSIS — H52203 Unspecified astigmatism, bilateral: Secondary | ICD-10-CM | POA: Diagnosis not present

## 2019-09-09 DIAGNOSIS — D23111 Other benign neoplasm of skin of right upper eyelid, including canthus: Secondary | ICD-10-CM | POA: Diagnosis not present

## 2019-09-19 DIAGNOSIS — R1012 Left upper quadrant pain: Secondary | ICD-10-CM | POA: Diagnosis not present

## 2019-09-20 ENCOUNTER — Other Ambulatory Visit: Payer: Self-pay | Admitting: Internal Medicine

## 2019-09-20 DIAGNOSIS — R1012 Left upper quadrant pain: Secondary | ICD-10-CM

## 2019-09-25 ENCOUNTER — Other Ambulatory Visit: Payer: Self-pay | Admitting: Internal Medicine

## 2019-09-25 ENCOUNTER — Ambulatory Visit
Admission: RE | Admit: 2019-09-25 | Discharge: 2019-09-25 | Disposition: A | Discharge status: Home / Self Care | Payer: Medicare Other | Source: Ambulatory Visit | Attending: Internal Medicine | Admitting: Internal Medicine

## 2019-09-25 ENCOUNTER — Other Ambulatory Visit: Payer: Self-pay

## 2019-09-25 DIAGNOSIS — K551 Chronic vascular disorders of intestine: Secondary | ICD-10-CM | POA: Diagnosis not present

## 2019-09-25 DIAGNOSIS — N2889 Other specified disorders of kidney and ureter: Secondary | ICD-10-CM | POA: Diagnosis not present

## 2019-09-25 DIAGNOSIS — I700 Atherosclerosis of aorta: Secondary | ICD-10-CM | POA: Diagnosis not present

## 2019-09-25 DIAGNOSIS — R1012 Left upper quadrant pain: Secondary | ICD-10-CM

## 2019-09-25 DIAGNOSIS — R918 Other nonspecific abnormal finding of lung field: Secondary | ICD-10-CM | POA: Diagnosis not present

## 2019-09-25 DIAGNOSIS — E875 Hyperkalemia: Secondary | ICD-10-CM | POA: Diagnosis not present

## 2019-09-25 MED ORDER — IOPAMIDOL (ISOVUE-300) INJECTION 61%
100.0000 mL | Freq: Once | INTRAVENOUS | Status: AC | PRN
  Administered 2019-09-25: 100 mL via INTRAVENOUS

## 2019-09-27 DIAGNOSIS — M5137 Other intervertebral disc degeneration, lumbosacral region: Secondary | ICD-10-CM | POA: Diagnosis not present

## 2019-09-27 DIAGNOSIS — M4120 Other idiopathic scoliosis, site unspecified: Secondary | ICD-10-CM | POA: Diagnosis not present

## 2019-09-27 DIAGNOSIS — M545 Low back pain: Secondary | ICD-10-CM | POA: Diagnosis not present

## 2019-10-31 ENCOUNTER — Telehealth: Payer: Self-pay | Admitting: *Deleted

## 2019-11-01 ENCOUNTER — Encounter: Payer: Self-pay | Admitting: Vascular Surgery

## 2019-11-01 ENCOUNTER — Encounter: Payer: Self-pay | Admitting: *Deleted

## 2019-11-01 ENCOUNTER — Ambulatory Visit (INDEPENDENT_AMBULATORY_CARE_PROVIDER_SITE_OTHER): Payer: Medicare Other | Admitting: Vascular Surgery

## 2019-11-01 ENCOUNTER — Other Ambulatory Visit: Payer: Self-pay

## 2019-11-01 VITALS — BP 141/83 | HR 66 | Temp 98.3°F | Resp 18 | Ht 63.0 in | Wt 114.0 lb

## 2019-11-01 DIAGNOSIS — K551 Chronic vascular disorders of intestine: Secondary | ICD-10-CM | POA: Diagnosis not present

## 2019-11-01 NOTE — Progress Notes (Signed)
Patient ID: Heather Olsen, female   DOB: November 28, 1931, 85 y.o.   MRN: [id number]  Reason for Consult: New Patient (Initial Visit)   Referred by Deland Pretty, MD  Subjective:     HPI:  Heather Olsen is a 85 y.o. female history of coronary artery stent.  Does not have any other vascular history.  Does have left upper quadrant pain has been going on for 2 years.  CT scan recently demonstrated SMA stenosis.  She does not have any pain with eating.  She has not lost any weight.  No food fear.  Bowels work normally.  Also has associated hot flashes.  Past Medical History:  Diagnosis Date  . Aortic stenosis 04/12/2016  . Back pain 04/12/2016  . Balance disorder 04/12/2016  . Beat, premature ventricular 01/08/2016  . Coronary artery disease   . Depression   . Diverticulosis   . Hearing loss 04/12/2016  . Hiatal hernia 04/12/2016  . HLD (hyperlipidemia) 04/12/2016  . Hyperglycemia 09/14/2016  . Hypertension   . Idiopathic scoliosis 04/12/2016  . Major depression 04/12/2016  . Mitral regurgitation   . Osteoporosis, senile 04/12/2016  . Pernicious anemia   . Premature atrial contraction   . Raynaud's disease 04/12/2016  . Scoliosis   . Spinal stenosis of lumbar region 04/12/2016  . Tricuspid valve prolapse    Family History  Problem Relation Age of Onset  . Heart disease Mother        Died 92, MR, no CAD  . Heart disease Father        CHF, no CAD..   . HIV Daughter 47       from bone tissue transplant   Past Surgical History:  Procedure Laterality Date  . APPENDECTOMY    . BREAST SURGERY    . broken wrist Right 2007  . CARPAL TUNNEL RELEASE Left 04/04/2017   Procedure: LEFT CARPAL TUNNEL RELEASE;  Surgeon: Daryll Brod, MD;  Location: [facility];  Service: Orthopedics;  Laterality: Left;  REG/FAB  . CATARACT EXTRACTION W/ INTRAOCULAR LENS  IMPLANT, BILATERAL Bilateral 2017  . COLONOSCOPY  2016  . FOOT SURGERY Right   . TONSILLECTOMY      Short Social History:  Social  History   Tobacco Use  . Smoking status: Never Smoker  . Smokeless tobacco: Never Used  Substance Use Topics  . Alcohol use: Yes    Comment: wine 3-4 times a week    Allergies  Allergen Reactions  . Sulfa Antibiotics Other (See Comments)    headaches  . Topiramate Rash  . Verapamil Other (See Comments)    constipation  . Zolpidem Other (See Comments)    hallucinations  . Prevacid [Lansoprazole]     Chest pain  . Ace Inhibitors Cough  . Codeine Nausea Only  . Ibandronic Acid Nausea Only    Current Outpatient Medications  Medication Sig Dispense Refill  . acetaminophen (TYLENOL) 650 MG CR tablet Take 650 mg by mouth every 8 (eight) hours as needed for pain.    [name] amLODipine (NORVASC) 5 MG tablet Take 2.5 mg by mouth daily.    [name] aspirin 81 MG chewable tablet Chew 81 mg by mouth. Take one daily    . celecoxib (CELEBREX) 200 MG capsule TAKE 1 CAPSULE BY MOUTH EVERY DAY 30 capsule 0  . Cholecalciferol (VITAMIN D3) 2000 units capsule Take 2,000 Units by mouth daily.     . clonazePAM (KLONOPIN) 0.5 MG tablet 0.25 mg.     [name]  cyanocobalamin (,VITAMIN B-12,) 1000 MCG/ML injection Inject 1 each into the skin every 30 (thirty) days.    . DULoxetine (CYMBALTA) 60 MG capsule Take 1 capsule by mouth daily.    [name] esomeprazole (NEXIUM) 40 MG capsule TAKE ONE CAPSULE BY MOUTH DAILY FOR STOMACH 30 capsule 2  . ipratropium (ATROVENT) 0.06 % nasal spray Use 2 sprays in each nostril up to three times daily before meals for sinus drainage. 30 mL 3  . Lactobacillus-Inulin ([place] PO) Take by mouth. Take one daily for probiotic    . metoprolol succinate (TOPROL-XL) 25 MG 24 hr tablet Take 25 mg by mouth daily.    . MULTIPLE VITAMINS-MINERALS PO Take 1 tablet by mouth daily.     . nitroGLYCERIN (NITROSTAT) 0.4 MG SL tablet Place under the tongue. Place one tablet under the tongue every 5 minutes as needed for chest pain. No more than 3    . rOPINIRole (REQUIP) 0.5 MG tablet Take 0.25  mg by mouth daily.    . traMADol (ULTRAM) 50 MG tablet TAKE 1 TABLET BY MOUTH EVERY 4 HOURS 75 tablet 0  . triamterene-hydrochlorothiazide (MAXZIDE-25) 37.5-25 MG tablet Take 0.5 tablets by mouth daily.    . Turmeric 500 MG CAPS Take 1,000 mg by mouth daily.     No current facility-administered medications for this visit.     Review of Systems  Constitutional:       Hot flashes  HENT: HENT negative.  Eyes: Eyes negative.  Cardiovascular: Cardiovascular negative.  GI: Positive for abdominal pain.  Musculoskeletal: Musculoskeletal negative.  Skin: Skin negative.  Neurological: Neurological negative. Hematologic: Hematologic/lymphatic negative.  Psychiatric: Psychiatric negative.        Objective:  Objective   Vitals:   11/01/19 1507  BP: (!) 141/83  Pulse: 66  Resp: 18  Temp: 98.3 F (36.8 C)  SpO2: 98%  Weight: 114 lb (51.7 kg)  Height: 5\' 3"  (1.6 m)   Body mass index is 20.19 kg/m.  Physical Exam HENT:     Head: Normocephalic.     Nose: Nose normal.     Mouth/Throat:     Mouth: Mucous membranes are moist.  Eyes:     Pupils: Pupils are equal, round, and reactive to light.  Cardiovascular:     Rate and Rhythm: Normal rate and regular rhythm.     Pulses:          Radial pulses are 2+ on the right side and 2+ on the left side.       Femoral pulses are 2+ on the right side and 2+ on the left side.      Popliteal pulses are 2+ on the right side and 2+ on the left side.  Abdominal:     General: Abdomen is flat.     Palpations: Abdomen is soft.     Tenderness: There is no abdominal tenderness.  Musculoskeletal: Normal range of motion.        General: No swelling.  Skin:    General: Skin is warm.     Capillary Refill: Capillary refill takes less than 2 seconds.  Neurological:     General: No focal deficit present.     Mental Status: She is alert.  Psychiatric:        Mood and Affect: Mood normal.        Thought Content: Thought content normal.     Data:  We reviewed her 2 CT scans from 2018 and more recently which demonstrate high-grade  stenosis of her SMA over approximately 18 mm length.     Assessment/Plan:     85 year old female with left upper quadrant pain that has been unexplainable.  She does have high-grade stenosis of her SMA.  I am unsure this is causing her pain.  I discussed this with the patient but at this time she is willing to try anything.  We will plan for SMA stenting will likely need to be uncovered stent given proximity to branching.  We discussed the need for Plavix for at least 4 weeks likely 3 months afterwards.  She demonstrates good understanding we will get her set up today.     Waynetta Sandy MD [facility]

## 2019-11-04 ENCOUNTER — Other Ambulatory Visit: Payer: Self-pay

## 2019-11-07 ENCOUNTER — Other Ambulatory Visit (HOSPITAL_COMMUNITY)
Admission: RE | Admit: 2019-11-07 | Discharge: 2019-11-07 | Disposition: A | Discharge status: Home / Self Care | Payer: Medicare Other | Source: Ambulatory Visit | Attending: Vascular Surgery | Admitting: Vascular Surgery

## 2019-11-07 DIAGNOSIS — Z20828 Contact with and (suspected) exposure to other viral communicable diseases: Secondary | ICD-10-CM | POA: Diagnosis not present

## 2019-11-07 DIAGNOSIS — Z01812 Encounter for preprocedural laboratory examination: Secondary | ICD-10-CM | POA: Insufficient documentation

## 2019-11-08 LAB — NOVEL CORONAVIRUS, NAA (HOSP ORDER, SEND-OUT TO REF LAB; TAT 18-24 HRS): SARS-CoV-2, NAA: NOT DETECTED

## 2019-11-11 ENCOUNTER — Ambulatory Visit (HOSPITAL_COMMUNITY)
Admission: RE | Admit: 2019-11-11 | Discharge: 2019-11-11 | Disposition: A | Discharge status: Home / Self Care | Payer: Medicare Other | Attending: Vascular Surgery | Admitting: Vascular Surgery

## 2019-11-11 ENCOUNTER — Encounter (HOSPITAL_COMMUNITY)
Admission: RE | Disposition: A | Discharge status: Home / Self Care | Payer: Self-pay | Source: Home / Self Care | Attending: Vascular Surgery

## 2019-11-11 ENCOUNTER — Other Ambulatory Visit: Payer: Self-pay

## 2019-11-11 DIAGNOSIS — K551 Chronic vascular disorders of intestine: Secondary | ICD-10-CM | POA: Diagnosis not present

## 2019-11-11 DIAGNOSIS — M4120 Other idiopathic scoliosis, site unspecified: Secondary | ICD-10-CM | POA: Diagnosis not present

## 2019-11-11 DIAGNOSIS — Z955 Presence of coronary angioplasty implant and graft: Secondary | ICD-10-CM | POA: Diagnosis not present

## 2019-11-11 DIAGNOSIS — I2510 Atherosclerotic heart disease of native coronary artery without angina pectoris: Secondary | ICD-10-CM | POA: Diagnosis not present

## 2019-11-11 DIAGNOSIS — Z882 Allergy status to sulfonamides status: Secondary | ICD-10-CM | POA: Diagnosis not present

## 2019-11-11 DIAGNOSIS — E785 Hyperlipidemia, unspecified: Secondary | ICD-10-CM | POA: Insufficient documentation

## 2019-11-11 DIAGNOSIS — Z885 Allergy status to narcotic agent status: Secondary | ICD-10-CM | POA: Diagnosis not present

## 2019-11-11 DIAGNOSIS — Z7982 Long term (current) use of aspirin: Secondary | ICD-10-CM | POA: Insufficient documentation

## 2019-11-11 DIAGNOSIS — I083 Combined rheumatic disorders of mitral, aortic and tricuspid valves: Secondary | ICD-10-CM | POA: Insufficient documentation

## 2019-11-11 DIAGNOSIS — H9190 Unspecified hearing loss, unspecified ear: Secondary | ICD-10-CM | POA: Diagnosis not present

## 2019-11-11 DIAGNOSIS — Z79899 Other long term (current) drug therapy: Secondary | ICD-10-CM | POA: Diagnosis not present

## 2019-11-11 DIAGNOSIS — Z8249 Family history of ischemic heart disease and other diseases of the circulatory system: Secondary | ICD-10-CM | POA: Diagnosis not present

## 2019-11-11 DIAGNOSIS — M810 Age-related osteoporosis without current pathological fracture: Secondary | ICD-10-CM | POA: Diagnosis not present

## 2019-11-11 DIAGNOSIS — Z888 Allergy status to other drugs, medicaments and biological substances status: Secondary | ICD-10-CM | POA: Diagnosis not present

## 2019-11-11 DIAGNOSIS — I7300 Raynaud's syndrome without gangrene: Secondary | ICD-10-CM | POA: Diagnosis not present

## 2019-11-11 DIAGNOSIS — I10 Essential (primary) hypertension: Secondary | ICD-10-CM | POA: Diagnosis not present

## 2019-11-11 HISTORY — PX: VISCERAL ANGIOGRAPHY: CATH118276

## 2019-11-11 HISTORY — PX: PERIPHERAL VASCULAR INTERVENTION: CATH118257

## 2019-11-11 LAB — POCT I-STAT, CHEM 8
BUN: 27 mg/dL — ABNORMAL HIGH (ref 8–23)
Calcium, Ion: 1.21 mmol/L (ref 1.15–1.40)
Chloride: 101 mmol/L (ref 98–111)
Creatinine, Ser: 1 mg/dL (ref 0.44–1.00)
Glucose, Bld: 116 mg/dL — ABNORMAL HIGH (ref 70–99)
HCT: 42 % (ref 36.0–46.0)
Hemoglobin: 14.3 g/dL (ref 12.0–15.0)
Potassium: 4.4 mmol/L (ref 3.5–5.1)
Sodium: 136 mmol/L (ref 135–145)
TCO2: 27 mmol/L (ref 22–32)

## 2019-11-11 SURGERY — VISCERAL ANGIOGRAPHY
Anesthesia: LOCAL

## 2019-11-11 MED ORDER — HEPARIN (PORCINE) IN NACL 1000-0.9 UT/500ML-% IV SOLN
INTRAVENOUS | Status: DC | PRN
  Administered 2019-11-11 (×2): 500 mL

## 2019-11-11 MED ORDER — CLOPIDOGREL BISULFATE 300 MG PO TABS
ORAL_TABLET | ORAL | Status: AC
  Filled 2019-11-11: qty 1

## 2019-11-11 MED ORDER — OXYCODONE HCL 5 MG PO TABS: 5.0000 mg | ORAL_TABLET | ORAL | Status: DC | PRN

## 2019-11-11 MED ORDER — ONDANSETRON HCL 4 MG/2ML IJ SOLN: 4.0000 mg | Freq: Four times a day (QID) | INTRAMUSCULAR | Status: DC | PRN

## 2019-11-11 MED ORDER — HEPARIN (PORCINE) IN NACL 1000-0.9 UT/500ML-% IV SOLN
INTRAVENOUS | Status: AC
  Filled 2019-11-11: qty 1000

## 2019-11-11 MED ORDER — LIDOCAINE HCL (PF) 1 % IJ SOLN
INTRAMUSCULAR | Status: AC
  Filled 2019-11-11: qty 30

## 2019-11-11 MED ORDER — CLOPIDOGREL BISULFATE 75 MG PO TABS: 75.0000 mg | ORAL_TABLET | Freq: Every day | ORAL | 3 refills | Status: DC

## 2019-11-11 MED ORDER — HYDRALAZINE HCL 20 MG/ML IJ SOLN
INTRAMUSCULAR | Status: DC | PRN
  Administered 2019-11-11: 10 mg via INTRAVENOUS

## 2019-11-11 MED ORDER — CLOPIDOGREL BISULFATE 300 MG PO TABS
ORAL_TABLET | ORAL | Status: DC | PRN
  Administered 2019-11-11: 300 mg via ORAL

## 2019-11-11 MED ORDER — SODIUM CHLORIDE 0.9% FLUSH: 3.0000 mL | INTRAVENOUS | Status: DC | PRN

## 2019-11-11 MED ORDER — SODIUM CHLORIDE 0.9% FLUSH: 3.0000 mL | Freq: Two times a day (BID) | INTRAVENOUS | Status: DC

## 2019-11-11 MED ORDER — HEPARIN SODIUM (PORCINE) 1000 UNIT/ML IJ SOLN
INTRAMUSCULAR | Status: DC | PRN
  Administered 2019-11-11: 5000 [IU] via INTRAVENOUS

## 2019-11-11 MED ORDER — CLOPIDOGREL BISULFATE 75 MG PO TABS: 75.0000 mg | ORAL_TABLET | Freq: Every day | ORAL | Status: DC

## 2019-11-11 MED ORDER — LABETALOL HCL 5 MG/ML IV SOLN: 10.0000 mg | INTRAVENOUS | Status: DC | PRN

## 2019-11-11 MED ORDER — HYDRALAZINE HCL 20 MG/ML IJ SOLN
INTRAMUSCULAR | Status: AC
  Filled 2019-11-11: qty 1

## 2019-11-11 MED ORDER — SODIUM CHLORIDE 0.9 % IV SOLN: 250.0000 mL | INTRAVENOUS | Status: DC | PRN

## 2019-11-11 MED ORDER — HYDRALAZINE HCL 20 MG/ML IJ SOLN: 5.0000 mg | INTRAMUSCULAR | Status: DC | PRN

## 2019-11-11 MED ORDER — ROSUVASTATIN CALCIUM 10 MG PO TABS: 10.0000 mg | ORAL_TABLET | Freq: Every day | ORAL | Status: DC

## 2019-11-11 MED ORDER — ACETAMINOPHEN 325 MG PO TABS: 650.0000 mg | ORAL_TABLET | ORAL | Status: DC | PRN

## 2019-11-11 MED ORDER — LIDOCAINE HCL (PF) 1 % IJ SOLN
INTRAMUSCULAR | Status: DC | PRN
  Administered 2019-11-11 (×2): 15 mL via INTRADERMAL

## 2019-11-11 MED ORDER — ROSUVASTATIN CALCIUM 10 MG PO TABS: 10.0000 mg | ORAL_TABLET | Freq: Every day | ORAL | 11 refills | Status: DC

## 2019-11-11 MED ORDER — CLOPIDOGREL BISULFATE 75 MG PO TABS: 300.0000 mg | ORAL_TABLET | Freq: Once | ORAL | Status: DC

## 2019-11-11 MED ORDER — HEPARIN SODIUM (PORCINE) 1000 UNIT/ML IJ SOLN
INTRAMUSCULAR | Status: AC
  Filled 2019-11-11: qty 1

## 2019-11-11 MED ORDER — SODIUM CHLORIDE 0.9 % WEIGHT BASED INFUSION: 1.0000 mL/kg/h | INTRAVENOUS | Status: DC

## 2019-11-11 MED ORDER — IODIXANOL 320 MG/ML IV SOLN
INTRAVENOUS | Status: DC | PRN
  Administered 2019-11-11: 45 mL via INTRA_ARTERIAL

## 2019-11-11 MED ORDER — SODIUM CHLORIDE 0.9 % IV SOLN
INTRAVENOUS | Status: DC
  Administered 2019-11-11: 11:00:00 via INTRAVENOUS

## 2019-11-11 SURGICAL SUPPLY — 19 items
CATH NAVICROSS ANG 65CM (CATHETERS) ×2 IMPLANT
CATH OMNI FLUSH 5F 65CM (CATHETERS) ×3 IMPLANT
CATH SOFT-VU ST 4F 90CM (CATHETERS) ×3 IMPLANT
CATHETER NAVICROSS ANG 65CM (CATHETERS) ×3
CLOSURE MYNX CONTROL 6F/7F (Vascular Products) ×3 IMPLANT
GLIDEWIRE ADV .035X180CM (WIRE) ×3 IMPLANT
KIT ENCORE 26 ADVANTAGE (KITS) ×3 IMPLANT
KIT MICROPUNCTURE NIT STIFF (SHEATH) ×3 IMPLANT
KIT PV (KITS) ×3 IMPLANT
SHEATH FLEX ANSEL ANG 6F 45CM (SHEATH) ×3 IMPLANT
SHEATH PINNACLE 5F 10CM (SHEATH) ×3 IMPLANT
SHEATH PINNACLE 6F 10CM (SHEATH) ×3 IMPLANT
SHEATH PROBE COVER 6X72 (BAG) ×3 IMPLANT
STENT EXPRESS LD 7X27X75 (Permanent Stent) ×3 IMPLANT
SYR MEDRAD MARK V 150ML (SYRINGE) ×3 IMPLANT
TRANSDUCER W/STOPCOCK (MISCELLANEOUS) ×3 IMPLANT
TRAY PV CATH (CUSTOM PROCEDURE TRAY) ×3 IMPLANT
WIRE BENTSON .035X145CM (WIRE) ×3 IMPLANT
WIRE ROSEN-J .035X180CM (WIRE) ×3 IMPLANT

## 2019-11-11 NOTE — Op Note (Signed)
    Patient name: Heather Olsen MRN: [id number] DOB: [birth year] Sex: female  11/11/2019 Pre-operative Diagnosis: Critical SMA stenosis Post-operative diagnosis:  Same Surgeon:  Erlene Quan C. Donzetta Matters, MD Procedure Performed: 1.  Ultrasound-guided cannulation right common femoral artery 2.  Aortogram 3.  SMA stent with 7 x 8mm biliary express 4.  Minx device closure right common femoral artery  Indications: 85 year old female with history of chronic vague abdominal pain.  This has been going on for multiple months.  She has CT scan which demonstrates critical stenosis of the SMA and she is now indicated for aortogram possible stenting.  Findings: SMA had approximately 90% stenosis for about 1.5 cm.  Following stenting there is no residual stenosis no dissection.   Procedure:  The patient was identified in the holding area and taken to room 8.  The patient was then placed supine on the table and prepped and draped in the usual sterile fashion.  A time out was called.  Ultrasound was used to evaluate the right common femoral artery which was noted to be patent.  There is anesthetized 1% lidocaine cannulated with direct ultrasound visualization with micropuncture needle followed by wire and sheath.  An image was saved the permanent record of the ultrasound guidance.  A Bentson wire was placed for a 5 [place] sheath.  Omni catheter placed to the level of T12 and aortogram performed in the lateral projection.  The stenosis was identified.  We exchanged for a long 6 French sheath patient was fully heparinized.  We used a combination of Omni catheter initially Bentson wire followed by a Glidewire advantage to cross the stenosis.  We exchanged for a straight catheter placed a Rosen wire.  The sheath was placed to the level of the stenosis.  We then placed our 7x27 stent in place performed angiography when the sheath was withdrawn.  We then deployed our stent.  Completion demonstrated no residual stenosis or  dissection.  We exchanged for short 6 French sheath deployed a minx device.  She tolerated procedure without immediate complication.   Contrast: 45cc   Jobin Montelongo C. Donzetta Matters, MD [facility] Office: [phone] Pager: [phone]

## 2019-11-11 NOTE — Discharge Instructions (Signed)
Femoral Site Care °This sheet gives you information about how to care for yourself after your procedure. Your health care provider may also give you more specific instructions. If you have problems or questions, contact your health care provider. °What can I expect after the procedure? °After the procedure, it is common to have: °· Bruising that usually fades within 1-2 weeks. °· Tenderness at the site. °Follow these instructions at home: °Wound care °· Follow instructions from your health care provider about how to take care of your insertion site. Make sure you: °? Wash your hands with soap and water before you change your bandage (dressing). If soap and water are not available, use hand sanitizer. °? Change your dressing as told by your health care provider. °? Leave stitches (sutures), skin glue, or adhesive strips in place. These skin closures may need to stay in place for 2 weeks or longer. If adhesive strip edges start to loosen and curl up, you may trim the loose edges. Do not remove adhesive strips completely unless your health care provider tells you to do that. °· Do not take baths, swim, or use a hot tub until your health care provider approves. °· You may shower 24-48 hours after the procedure or as told by your health care provider. °? Gently wash the site with plain soap and water. °? Pat the area dry with a clean towel. °? Do not rub the site. This may cause bleeding. °· Do not apply powder or lotion to the site. Keep the site clean and dry. °· Check your femoral site every day for signs of infection. Check for: °? Redness, swelling, or pain. °? Fluid or blood. °? Warmth. °? Pus or a bad smell. °Activity °· For the first 2-3 days after your procedure, or as long as directed: °? Avoid climbing stairs as much as possible. °? Do not squat. °· Do not lift anything that is heavier than 10 lb (4.5 kg), or the limit that you are told, until your health care provider says that it is safe. °· Rest as  directed. °? Avoid sitting for a long time without moving. Get up to take short walks every 1-2 hours. °· Do not drive for 24 hours if you were given a medicine to help you relax (sedative). °General instructions °· Take over-the-counter and prescription medicines only as told by your health care provider. °· Keep all follow-up visits as told by your health care provider. This is important. °Contact a health care provider if you have: °· A fever or chills. °· You have redness, swelling, or pain around your insertion site. °Get help right away if: °· The catheter insertion area swells very fast. °· You pass out. °· You suddenly start to sweat or your skin gets clammy. °· The catheter insertion area is bleeding, and the bleeding does not stop when you hold steady pressure on the area. °· The area near or just beyond the catheter insertion site becomes pale, cool, tingly, or numb. °These symptoms may represent a serious problem that is an emergency. Do not wait to see if the symptoms will go away. Get medical help right away. Call your local emergency services (911 in the [place] Do not drive yourself to the hospital. °Summary °· After the procedure, it is common to have bruising that usually fades within 1-2 weeks. °· Check your femoral site every day for signs of infection. °· Do not lift anything that is heavier than 10 lb (4.5 kg), or the   limit that you are told, until your health care provider says that it is safe. °This information is not intended to replace advice given to you by your health care provider. Make sure you discuss any questions you have with your health care provider. °Document Released: 07/18/2014 Document Revised: 11/27/2017 Document Reviewed: 11/27/2017 °Elsevier Patient Education © 2020 Elsevier Inc[employer] ° °

## 2019-11-11 NOTE — H&P (Signed)
   History and Physical Update  The patient was interviewed and re-examined.  The patient's previous History and Physical has been reviewed and is unchanged from recent office visit.  Plan will be for aortogram possible SMA intervention today.  I have discussed the risk benefits and alternatives with the patient including this may not help her nonspecific abdominal pain.  She demonstrates good understanding.  Heather Olsen C. Donzetta Matters, MD [facility] Office: [phone] Pager: [phone]   11/11/2019, 11:30 AM

## 2019-11-11 NOTE — Progress Notes (Addendum)
Ambulated in hallway tol well no bleeding noted from groin site.

## 2019-11-11 NOTE — Progress Notes (Signed)
Discharge instructions reviewed with pt and with Ted. Both voice understanding.

## 2019-12-03 DIAGNOSIS — Z23 Encounter for immunization: Secondary | ICD-10-CM | POA: Diagnosis not present

## 2019-12-09 DIAGNOSIS — R748 Abnormal levels of other serum enzymes: Secondary | ICD-10-CM | POA: Diagnosis not present

## 2019-12-09 DIAGNOSIS — R61 Generalized hyperhidrosis: Secondary | ICD-10-CM | POA: Diagnosis not present

## 2019-12-09 DIAGNOSIS — R1012 Left upper quadrant pain: Secondary | ICD-10-CM | POA: Diagnosis not present

## 2019-12-17 ENCOUNTER — Telehealth (HOSPITAL_COMMUNITY): Payer: Self-pay

## 2019-12-17 ENCOUNTER — Other Ambulatory Visit: Payer: Self-pay

## 2019-12-17 DIAGNOSIS — K551 Chronic vascular disorders of intestine: Secondary | ICD-10-CM

## 2019-12-17 NOTE — Telephone Encounter (Signed)

## 2019-12-18 ENCOUNTER — Ambulatory Visit (HOSPITAL_COMMUNITY)
Admission: RE | Admit: 2019-12-18 | Discharge: 2019-12-18 | Disposition: A | Discharge status: Home / Self Care | Payer: Medicare Other | Source: Ambulatory Visit | Attending: Vascular Surgery | Admitting: Vascular Surgery

## 2019-12-18 ENCOUNTER — Other Ambulatory Visit: Payer: Self-pay

## 2019-12-18 DIAGNOSIS — R300 Dysuria: Secondary | ICD-10-CM | POA: Diagnosis not present

## 2019-12-18 DIAGNOSIS — K551 Chronic vascular disorders of intestine: Secondary | ICD-10-CM | POA: Diagnosis not present

## 2019-12-18 DIAGNOSIS — R238 Other skin changes: Secondary | ICD-10-CM | POA: Diagnosis not present

## 2019-12-18 DIAGNOSIS — R748 Abnormal levels of other serum enzymes: Secondary | ICD-10-CM | POA: Diagnosis not present

## 2019-12-18 DIAGNOSIS — K219 Gastro-esophageal reflux disease without esophagitis: Secondary | ICD-10-CM | POA: Diagnosis not present

## 2019-12-18 DIAGNOSIS — M546 Pain in thoracic spine: Secondary | ICD-10-CM | POA: Diagnosis not present

## 2019-12-18 DIAGNOSIS — M4125 Other idiopathic scoliosis, thoracolumbar region: Secondary | ICD-10-CM | POA: Diagnosis not present

## 2019-12-20 ENCOUNTER — Other Ambulatory Visit: Payer: Self-pay

## 2019-12-20 ENCOUNTER — Encounter: Payer: Self-pay | Admitting: Vascular Surgery

## 2019-12-20 ENCOUNTER — Ambulatory Visit (INDEPENDENT_AMBULATORY_CARE_PROVIDER_SITE_OTHER): Payer: Medicare Other | Admitting: Vascular Surgery

## 2019-12-20 DIAGNOSIS — Z7982 Long term (current) use of aspirin: Secondary | ICD-10-CM

## 2019-12-20 DIAGNOSIS — R109 Unspecified abdominal pain: Secondary | ICD-10-CM

## 2019-12-20 DIAGNOSIS — Z79899 Other long term (current) drug therapy: Secondary | ICD-10-CM

## 2019-12-20 DIAGNOSIS — Z7902 Long term (current) use of antithrombotics/antiplatelets: Secondary | ICD-10-CM | POA: Diagnosis not present

## 2019-12-20 DIAGNOSIS — K551 Chronic vascular disorders of intestine: Secondary | ICD-10-CM

## 2019-12-20 DIAGNOSIS — Z969 Presence of functional implant, unspecified: Secondary | ICD-10-CM | POA: Diagnosis not present

## 2019-12-20 NOTE — Progress Notes (Signed)
Virtual Visit via Telephone Note   I connected with [name] on 12/20/2019 using the [email or web address] by telephone and verified that I was speaking with the correct person using two identifiers.   The limitations of evaluation and management by telemedicine and the availability of in person appointments have been previously discussed with the patient and are documented in the patients chart. The patient expressed understanding and consented to proceed.  PCP: Deland Pretty, MD  Chief Complaint: Abdominal pain  History of Present Illness: Heather Olsen is a 86 y.o. female with history of vague abdominal pain found to have 90% stenosis of her SMA.  She is now status post stenting. Continues to have abdominal pain despite stenting. She thinks this is likely related to her scoliosis.  Not having any issues with eating at this time.  She continues to take aspirin, Plavix, statin  Past Medical History:  Diagnosis Date  . Aortic stenosis 04/12/2016  . Back pain 04/12/2016  . Balance disorder 04/12/2016  . Beat, premature ventricular 01/08/2016  . Coronary artery disease   . Depression   . Diverticulosis   . Hearing loss 04/12/2016  . Hiatal hernia 04/12/2016  . HLD (hyperlipidemia) 04/12/2016  . Hyperglycemia 09/14/2016  . Hypertension   . Idiopathic scoliosis 04/12/2016  . Major depression 04/12/2016  . Mitral regurgitation   . Osteoporosis, senile 04/12/2016  . Pernicious anemia   . Premature atrial contraction   . Raynaud's disease 04/12/2016  . Scoliosis   . Spinal stenosis of lumbar region 04/12/2016  . Tricuspid valve prolapse     Past Surgical History:  Procedure Laterality Date  . APPENDECTOMY    . BREAST SURGERY    . broken wrist Right 2007  . CARPAL TUNNEL RELEASE Left 04/04/2017   Procedure: LEFT CARPAL TUNNEL RELEASE;  Surgeon: Daryll Brod, MD;  Location: [facility];  Service: Orthopedics;  Laterality: Left;  REG/FAB  . CATARACT EXTRACTION W/ INTRAOCULAR LENS   IMPLANT, BILATERAL Bilateral 2017  . COLONOSCOPY  2016  . FOOT SURGERY Right   . PERIPHERAL VASCULAR INTERVENTION  11/11/2019   Procedure: PERIPHERAL VASCULAR INTERVENTION;  Surgeon: Waynetta Sandy, MD;  Location: [facility];  Service: Cardiovascular;;  . TONSILLECTOMY    . VISCERAL ANGIOGRAPHY N/A 11/11/2019   Procedure: VISCERAL ANGIOGRAPHY;  Surgeon: Waynetta Sandy, MD;  Location: [facility];  Service: Cardiovascular;  Laterality: N/A;    No outpatient medications have been marked as taking for the 12/20/19 encounter (Office Visit) with Waynetta Sandy, MD.    12 system ROS was negative unless otherwise noted in HPI   Observations/Objective: States that she is generally doing well despite abdominal pain   Data: Mesenteric duplex was performed 2 days ago which demonstrates widely patent SMA and stent with no evidence of restenosis.  Assessment and Plan: 86yo female s/p sma stenting for abdominal pain.  Unfortunately this has not resolved her pain she is concerned this is now related to her scoliosis.  She will continue aspirin, Plavix, statin.  Follow Up Instructions: 6 months with repeat mesenteric duplex   I discussed the assessment and treatment plan with the patient. The patient was provided an opportunity to ask questions and all were answered. The patient agreed with the plan and demonstrated an understanding of the instructions.   The patient was advised to call back or seek an in-person evaluation if the symptoms worsen or if the condition fails to improve as anticipated.  I spent  12 minutes with the patient and chart review via telephone encounter.   Signed, Servando Snare [facility] Office: [phone]  12/20/2019, 11:16 AM

## 2019-12-23 ENCOUNTER — Other Ambulatory Visit: Payer: Self-pay | Admitting: *Deleted

## 2019-12-23 DIAGNOSIS — K551 Chronic vascular disorders of intestine: Secondary | ICD-10-CM

## 2019-12-26 ENCOUNTER — Other Ambulatory Visit: Payer: Self-pay | Admitting: Internal Medicine

## 2019-12-26 DIAGNOSIS — R300 Dysuria: Secondary | ICD-10-CM

## 2019-12-26 DIAGNOSIS — R232 Flushing: Secondary | ICD-10-CM | POA: Diagnosis not present

## 2019-12-26 DIAGNOSIS — F419 Anxiety disorder, unspecified: Secondary | ICD-10-CM | POA: Diagnosis not present

## 2019-12-27 ENCOUNTER — Encounter: Payer: Self-pay | Admitting: Cardiology

## 2019-12-27 DIAGNOSIS — R0600 Dyspnea, unspecified: Secondary | ICD-10-CM | POA: Diagnosis not present

## 2019-12-27 DIAGNOSIS — R002 Palpitations: Secondary | ICD-10-CM | POA: Diagnosis not present

## 2019-12-27 DIAGNOSIS — R000 Tachycardia, unspecified: Secondary | ICD-10-CM | POA: Diagnosis not present

## 2019-12-27 NOTE — Telephone Encounter (Signed)
Error

## 2019-12-29 DIAGNOSIS — R0600 Dyspnea, unspecified: Secondary | ICD-10-CM | POA: Insufficient documentation

## 2019-12-29 NOTE — Progress Notes (Signed)
Cardiology Office Note   Date:  12/30/2019   ID:  Heather Olsen, Heather Olsen May 28, 1931, MRN [id number]  PCP:  Deland Pretty, MD  Cardiologist:   Minus Breeding, MD   Chief Complaint  Patient presents with  . Palpitations      History of Present Illness: Heather Olsen is a 86 y.o. female who presents for follow up of CAD. She had stent placement in 2008 and was followed by [facility].  She had an LAD stent placed in 2008 which was a 3.8 mm Vision. She had follow-up cath in 2009 with 30%  narrowing of the stent but otherwise no disease. Stress perfusion study in 2014 was negative. She does have some tricuspid regurgitation and some mild mitral regurgitation.  Lexiscan Myoview was negative in 2017.   Since I last saw her she has had stenting of her SMA for abdominal pian.  Unfortunately, she continues to have pain.    She has multiple complaints.  She is feeling hot flashes.  She feels her feet burning.  She is having some knee palpitations and she is actually wearing another monitor that her primary care provider put on her.  She states she has been having some rapid heart rates with her heart rate being 103 recently.  Her blood pressure was low yesterday at 111/77.  She feels her heart beating fast.  He makes her uncomfortable.  She has a pressure in her chest.  She gets short of breath.  This is acute episodes as well as chronic symptoms that she has been having.  Is not had any frank syncope or presyncope.   Past Medical History:  Diagnosis Date  . Aortic stenosis 04/12/2016  . Back pain 04/12/2016  . Balance disorder 04/12/2016  . Beat, premature ventricular 01/08/2016  . Coronary artery disease   . Depression   . Diverticulosis   . Hearing loss 04/12/2016  . Hiatal hernia 04/12/2016  . HLD (hyperlipidemia) 04/12/2016  . Hyperglycemia 09/14/2016  . Hypertension   . Idiopathic scoliosis 04/12/2016  . Major depression 04/12/2016  . Mitral regurgitation   . Osteoporosis, senile  04/12/2016  . Pernicious anemia   . Premature atrial contraction   . Raynaud's disease 04/12/2016  . Scoliosis   . Spinal stenosis of lumbar region 04/12/2016  . Tricuspid valve prolapse     Past Surgical History:  Procedure Laterality Date  . APPENDECTOMY    . BREAST SURGERY    . broken wrist Right 2007  . CARPAL TUNNEL RELEASE Left 04/04/2017   Procedure: LEFT CARPAL TUNNEL RELEASE;  Surgeon: Daryll Brod, MD;  Location: [place];  Service: Orthopedics;  Laterality: Left;  REG/FAB  . CATARACT EXTRACTION W/ INTRAOCULAR LENS  IMPLANT, BILATERAL Bilateral 2017  . COLONOSCOPY  2016  . FOOT SURGERY Right   . PERIPHERAL VASCULAR INTERVENTION  11/11/2019   Procedure: PERIPHERAL VASCULAR INTERVENTION;  Surgeon: Waynetta Sandy, MD;  Location: [place] [facility];  Service: Cardiovascular;;  . TONSILLECTOMY    . VISCERAL ANGIOGRAPHY N/A 11/11/2019   Procedure: VISCERAL ANGIOGRAPHY;  Surgeon: Waynetta Sandy, MD;  Location: [place] [facility];  Service: Cardiovascular;  Laterality: N/A;     Current Outpatient Medications  Medication Sig Dispense Refill  . acetaminophen (TYLENOL) 650 MG CR tablet Take 650 mg by mouth every 8 (eight) hours as needed for pain.    [name] aspirin 81 MG chewable tablet Chew 81 mg by mouth. Take one daily    .  celecoxib (CELEBREX) 200 MG capsule TAKE 1 CAPSULE BY MOUTH EVERY DAY 30 capsule 0  . Cholecalciferol (VITAMIN D3) 2000 units capsule Take 2,000 Units by mouth daily.     . clonazePAM (KLONOPIN) 0.5 MG tablet 0.25 mg.     . clopidogrel (PLAVIX) 75 MG tablet Take 1 tablet (75 mg total) by mouth daily. 30 tablet 3  . cyanocobalamin (,VITAMIN B-12,) 1000 MCG/ML injection Inject 1 each into the skin every 30 (thirty) days.    . DULoxetine (CYMBALTA) 60 MG capsule Take 1 capsule by mouth daily.    [name] ipratropium (ATROVENT) 0.06 % nasal spray Use 2 sprays in each nostril up to three times daily before meals for sinus drainage. 30 mL 3   . Lactobacillus-Inulin ([place] PO) Take by mouth. Take one daily for probiotic    . metoprolol succinate (TOPROL-XL) 25 MG 24 hr tablet Take 1 tablet (25 mg total) by mouth 2 (two) times daily. 180 tablet 3  . MULTIPLE VITAMINS-MINERALS PO Take 1 tablet by mouth daily.     . nitroGLYCERIN (NITROSTAT) 0.4 MG SL tablet Place under the tongue. Place one tablet under the tongue every 5 minutes as needed for chest pain. No more than 3    . pantoprazole (PROTONIX) 40 MG tablet Take 40 mg by mouth daily.    [name] rOPINIRole (REQUIP) 0.5 MG tablet Take 0.25 mg by mouth daily.    . rosuvastatin (CRESTOR) 10 MG tablet Take 1 tablet (10 mg total) by mouth at bedtime. 30 tablet 11  . traMADol (ULTRAM) 50 MG tablet TAKE 1 TABLET BY MOUTH EVERY 4 HOURS (Patient taking differently: Take 50 mg by mouth every 4 (four) hours as needed (pain). ) 75 tablet 0  . trimethoprim (TRIMPEX) 100 MG tablet Take 100 mg by mouth daily.     No current facility-administered medications for this visit.    Allergies:   Sulfa antibiotics, Topiramate, Verapamil, Zolpidem, Prevacid [lansoprazole], Ace inhibitors, Codeine, and Ibandronic acid    ROS:  Please see the history of present illness.   Otherwise, review of systems are positive for none.   All other systems are reviewed and negative.    PHYSICAL EXAM: VS:  BP 139/78   Pulse 81   Temp (!) 97.3 F (36.3 C) (Temporal)   Ht 5\' 4"  (1.626 m)   Wt 114 lb (51.7 kg)   SpO2 97%   BMI 19.57 kg/m  , BMI Body mass index is 19.57 kg/m.  GENERAL:  Frail appearing NECK:  No jugular venous distention, waveform within normal limits, carotid upstroke brisk and symmetric, no bruits, no thyromegaly LUNGS:  Clear to auscultation bilaterally CHEST:  Unremarkable HEART:  PMI not displaced or sustained,S1 and S2 within normal limits, no S3, no S4, no clicks, no rubs, no murmurs ABD:  Flat, positive bowel sounds normal in frequency in pitch, no bruits, no rebound, no  guarding, no midline pulsatile mass, no hepatomegaly, no splenomegaly EXT:  2 plus pulses throughout, no edema, no cyanosis no clubbing  EKG:  EKG is   ordered today. The ekg ordered today demonstrates sinus bradycardia, rate 73, left axis deviation, left anterior fascicular block, RSR prime V1 and V2, poor anterior R wave progression, first-degree AV block, no acute ST-T wave changes.     Recent Labs: 11/11/2019: BUN 27; Creatinine, Ser 1.00; Hemoglobin 14.3; Potassium 4.4; Sodium 136    Lipid Panel    Component Value Date/Time   CHOL 232 (H) 09/14/2016 1415  TRIG 168 (H) 09/14/2016 1415   HDL 99 09/14/2016 1415   CHOLHDL 2.3 09/14/2016 1415   VLDL 34 (H) 09/14/2016 1415   LDLCALC 99 09/14/2016 1415      Wt Readings from Last 3 Encounters:  12/30/19 114 lb (51.7 kg)  11/11/19 110 lb (49.9 kg)  11/01/19 114 lb (51.7 kg)      Other studies Reviewed: Additional studies/ records that were reviewed today include: Labs from PCP Review of the above records demonstrates:  See elsewhere  ASSESSMENT AND PLAN:  CAD: She had a negative Lexiscan Myoview in December of 2017.  She does have an EKG with suggestion of old MI but she has never had evidence of this on her perfusion study or her echo and so this is really related to the positioning of her heart inside her chest with her significant bony abnormalities.  No further work-up.  PALPITATIONS:  She is very symptomatic and anxious with palpitations.  I am going to increase her metoprolol by having her take 1/2 pill in the morning and a full pill in the evening.  We may increase this depending her response to this and I will be happy to look at the results of the monitor from her primary provider when it is made available.   AORTIC STENOSIS: She has very mild AS and MR and I am going to follow this clinically.  HTN:The blood pressure isactually somewhat low.  No change in therapy.   DYSPNEA: There is not a clear etiology  for this other than perhaps bony abnormalities along with muscle wasting related to age.  We discussed pulmonary function testing but I doubt that there would be anything that would demonstrate a significant reversible causes.   COVID EDUCATION:  They are getting there second dose of the vaccine today.   Current medicines are reviewed at length with the patient today.  The patient does not have  concerns regarding medicines.  The following changes have been made:  See above  Labs/ tests ordered today include:      Orders Placed This Encounter  Procedures  . EKG 12-Lead  . VAS [place] ABI WITH/WO TBI     Disposition:   FU with me in one month.     Signed, Minus Breeding, MD  12/30/2019 1:13 PM    [facility]

## 2019-12-30 ENCOUNTER — Ambulatory Visit (INDEPENDENT_AMBULATORY_CARE_PROVIDER_SITE_OTHER): Payer: Medicare Other | Admitting: Cardiology

## 2019-12-30 ENCOUNTER — Other Ambulatory Visit: Payer: Self-pay | Admitting: Cardiology

## 2019-12-30 ENCOUNTER — Encounter: Payer: Self-pay | Admitting: Cardiology

## 2019-12-30 ENCOUNTER — Other Ambulatory Visit: Payer: Self-pay

## 2019-12-30 VITALS — BP 139/78 | HR 81 | Temp 97.3°F | Ht 64.0 in | Wt 114.0 lb

## 2019-12-30 DIAGNOSIS — I2510 Atherosclerotic heart disease of native coronary artery without angina pectoris: Secondary | ICD-10-CM

## 2019-12-30 DIAGNOSIS — I350 Nonrheumatic aortic (valve) stenosis: Secondary | ICD-10-CM | POA: Diagnosis not present

## 2019-12-30 DIAGNOSIS — M79671 Pain in right foot: Secondary | ICD-10-CM

## 2019-12-30 DIAGNOSIS — M79672 Pain in left foot: Secondary | ICD-10-CM

## 2019-12-30 DIAGNOSIS — R002 Palpitations: Secondary | ICD-10-CM | POA: Diagnosis not present

## 2019-12-30 DIAGNOSIS — R0600 Dyspnea, unspecified: Secondary | ICD-10-CM

## 2019-12-30 DIAGNOSIS — Z23 Encounter for immunization: Secondary | ICD-10-CM | POA: Diagnosis not present

## 2019-12-30 DIAGNOSIS — I10 Essential (primary) hypertension: Secondary | ICD-10-CM | POA: Diagnosis not present

## 2019-12-30 MED ORDER — METOPROLOL SUCCINATE ER 25 MG PO TB24: 25.0000 mg | ORAL_TABLET | Freq: Two times a day (BID) | ORAL | 3 refills | Status: DC

## 2019-12-30 NOTE — Patient Instructions (Signed)
Medication Instructions:  Increase Metoprolol 25mg  to twice a day *If you need a refill on your cardiac medications before your next appointment, please call your pharmacy*  Lab Work: None  Testing/Procedures: Your physician has requested that you have an ankle brachial index (ABI). During this test an ultrasound and blood pressure cuff are used to evaluate the arteries that supply the arms and legs with blood. Allow thirty minutes for this exam. There are no restrictions or special instructions.   Follow-Up: At [facility], you and your health needs are our priority.  As part of our continuing mission to provide you with exceptional heart care, we have created designated Provider Care Teams.  These Care Teams include your primary Cardiologist (physician) and Advanced Practice Providers (APPs -  Physician Assistants and Nurse Practitioners) who all work together to provide you with the care you need, when you need it.  Your next appointment:   1 month(s)  The format for your next appointment:   In Person  Provider:   Minus Breeding, MD

## 2020-01-01 DIAGNOSIS — R1012 Left upper quadrant pain: Secondary | ICD-10-CM | POA: Diagnosis not present

## 2020-01-01 DIAGNOSIS — R748 Abnormal levels of other serum enzymes: Secondary | ICD-10-CM | POA: Diagnosis not present

## 2020-01-02 ENCOUNTER — Ambulatory Visit (HOSPITAL_COMMUNITY): Payer: Medicare Other

## 2020-01-06 ENCOUNTER — Other Ambulatory Visit: Payer: Self-pay

## 2020-01-06 ENCOUNTER — Ambulatory Visit
Admission: RE | Admit: 2020-01-06 | Discharge: 2020-01-06 | Disposition: A | Discharge status: Home / Self Care | Payer: Medicare Other | Source: Ambulatory Visit | Attending: Internal Medicine | Admitting: Internal Medicine

## 2020-01-06 DIAGNOSIS — M5124 Other intervertebral disc displacement, thoracic region: Secondary | ICD-10-CM | POA: Diagnosis not present

## 2020-01-06 DIAGNOSIS — R300 Dysuria: Secondary | ICD-10-CM

## 2020-01-07 ENCOUNTER — Other Ambulatory Visit: Payer: Self-pay | Admitting: Cardiology

## 2020-01-07 ENCOUNTER — Ambulatory Visit (HOSPITAL_COMMUNITY)
Admission: RE | Admit: 2020-01-07 | Discharge: 2020-01-07 | Disposition: A | Discharge status: Home / Self Care | Payer: Medicare Other | Source: Ambulatory Visit | Attending: Internal Medicine | Admitting: Internal Medicine

## 2020-01-07 ENCOUNTER — Other Ambulatory Visit: Payer: Self-pay

## 2020-01-07 DIAGNOSIS — M79671 Pain in right foot: Secondary | ICD-10-CM

## 2020-01-07 DIAGNOSIS — R61 Generalized hyperhidrosis: Secondary | ICD-10-CM | POA: Diagnosis not present

## 2020-01-07 DIAGNOSIS — I700 Atherosclerosis of aorta: Secondary | ICD-10-CM

## 2020-01-07 DIAGNOSIS — M79672 Pain in left foot: Secondary | ICD-10-CM | POA: Insufficient documentation

## 2020-01-07 DIAGNOSIS — M4804 Spinal stenosis, thoracic region: Secondary | ICD-10-CM | POA: Diagnosis not present

## 2020-01-07 DIAGNOSIS — R0602 Shortness of breath: Secondary | ICD-10-CM | POA: Diagnosis not present

## 2020-01-08 DIAGNOSIS — R0602 Shortness of breath: Secondary | ICD-10-CM | POA: Diagnosis not present

## 2020-01-17 DIAGNOSIS — R002 Palpitations: Secondary | ICD-10-CM | POA: Diagnosis not present

## 2020-01-20 ENCOUNTER — Encounter: Payer: Self-pay | Admitting: Cardiology

## 2020-01-20 NOTE — Telephone Encounter (Signed)
Error

## 2020-01-21 ENCOUNTER — Telehealth: Payer: Self-pay | Admitting: Cardiology

## 2020-01-21 DIAGNOSIS — R002 Palpitations: Secondary | ICD-10-CM | POA: Diagnosis not present

## 2020-01-21 NOTE — Telephone Encounter (Signed)
[name] from Dr. Liliane Channel office at [facility] wanted to make sure the patient is safe to have an Upper Endoscopy done on 01-27-20.  The patient is set to have a COVID test today 2:30 for the procedure, and if she is not cleared to have the Endoscopy, she would like to avoid doing the COVID test if possible.   The patient recently wore a short term heart monitor but is waiting for the results, which is why she is not sure if it is safe for her to have the procedure done.   Please contact the patient with her monitor results and to let her know if she is cleared to have the procedure.   Dr. Liliane Channel office would also like a copy of the results faxed to them at [phone]. [name] can be reached by phone at [phone] if necessary

## 2020-01-21 NOTE — Telephone Encounter (Signed)
Spoke with [name] at Dr. Liliane Channel office. Patient reported to them SOB and increased HR. [name] is going to post post the procedure until patient is cleared by cardiology. She will fax over a new clearance form.   Spoke with patient. She is having her heart monitor results sent to Dr. Percival Spanish and would like an appointment after the results are received. Informed patient once results of the heart monitor have been faxed nurse will call to schedule an appt with the patient.  She verbalized understanding.

## 2020-01-28 DIAGNOSIS — M4120 Other idiopathic scoliosis, site unspecified: Secondary | ICD-10-CM | POA: Diagnosis not present

## 2020-01-28 DIAGNOSIS — M546 Pain in thoracic spine: Secondary | ICD-10-CM | POA: Diagnosis not present

## 2020-01-31 DIAGNOSIS — I4891 Unspecified atrial fibrillation: Secondary | ICD-10-CM | POA: Diagnosis not present

## 2020-01-31 DIAGNOSIS — I2510 Atherosclerotic heart disease of native coronary artery without angina pectoris: Secondary | ICD-10-CM | POA: Diagnosis not present

## 2020-01-31 DIAGNOSIS — R61 Generalized hyperhidrosis: Secondary | ICD-10-CM | POA: Diagnosis not present

## 2020-01-31 DIAGNOSIS — F339 Major depressive disorder, recurrent, unspecified: Secondary | ICD-10-CM | POA: Diagnosis not present

## 2020-01-31 DIAGNOSIS — Z791 Long term (current) use of non-steroidal anti-inflammatories (NSAID): Secondary | ICD-10-CM | POA: Diagnosis not present

## 2020-02-05 DIAGNOSIS — I10 Essential (primary) hypertension: Secondary | ICD-10-CM | POA: Diagnosis not present

## 2020-02-05 DIAGNOSIS — E538 Deficiency of other specified B group vitamins: Secondary | ICD-10-CM | POA: Diagnosis not present

## 2020-02-05 DIAGNOSIS — E118 Type 2 diabetes mellitus with unspecified complications: Secondary | ICD-10-CM | POA: Diagnosis not present

## 2020-02-05 DIAGNOSIS — E119 Type 2 diabetes mellitus without complications: Secondary | ICD-10-CM | POA: Diagnosis not present

## 2020-02-05 DIAGNOSIS — E782 Mixed hyperlipidemia: Secondary | ICD-10-CM | POA: Diagnosis not present

## 2020-02-12 DIAGNOSIS — M8949 Other hypertrophic osteoarthropathy, multiple sites: Secondary | ICD-10-CM | POA: Diagnosis not present

## 2020-02-12 DIAGNOSIS — M810 Age-related osteoporosis without current pathological fracture: Secondary | ICD-10-CM | POA: Diagnosis not present

## 2020-02-12 DIAGNOSIS — F419 Anxiety disorder, unspecified: Secondary | ICD-10-CM | POA: Diagnosis not present

## 2020-02-12 DIAGNOSIS — Z0000 Encounter for general adult medical examination without abnormal findings: Secondary | ICD-10-CM | POA: Diagnosis not present

## 2020-02-12 DIAGNOSIS — I10 Essential (primary) hypertension: Secondary | ICD-10-CM | POA: Diagnosis not present

## 2020-02-12 DIAGNOSIS — Z7901 Long term (current) use of anticoagulants: Secondary | ICD-10-CM | POA: Diagnosis not present

## 2020-02-12 DIAGNOSIS — K219 Gastro-esophageal reflux disease without esophagitis: Secondary | ICD-10-CM | POA: Diagnosis not present

## 2020-02-12 DIAGNOSIS — E119 Type 2 diabetes mellitus without complications: Secondary | ICD-10-CM | POA: Diagnosis not present

## 2020-02-12 DIAGNOSIS — M545 Low back pain: Secondary | ICD-10-CM | POA: Diagnosis not present

## 2020-02-12 DIAGNOSIS — G2581 Restless legs syndrome: Secondary | ICD-10-CM | POA: Diagnosis not present

## 2020-02-12 DIAGNOSIS — E782 Mixed hyperlipidemia: Secondary | ICD-10-CM | POA: Diagnosis not present

## 2020-02-12 DIAGNOSIS — I2510 Atherosclerotic heart disease of native coronary artery without angina pectoris: Secondary | ICD-10-CM | POA: Diagnosis not present

## 2020-02-13 DIAGNOSIS — M8949 Other hypertrophic osteoarthropathy, multiple sites: Secondary | ICD-10-CM | POA: Diagnosis not present

## 2020-02-13 DIAGNOSIS — Z7189 Other specified counseling: Secondary | ICD-10-CM | POA: Insufficient documentation

## 2020-02-13 DIAGNOSIS — F339 Major depressive disorder, recurrent, unspecified: Secondary | ICD-10-CM | POA: Diagnosis not present

## 2020-02-13 DIAGNOSIS — R61 Generalized hyperhidrosis: Secondary | ICD-10-CM | POA: Diagnosis not present

## 2020-02-13 DIAGNOSIS — I4891 Unspecified atrial fibrillation: Secondary | ICD-10-CM | POA: Diagnosis not present

## 2020-02-13 NOTE — Progress Notes (Signed)
Cardiology Office Note   Date:  02/14/2020   ID:  Heather Olsen, [place] 1931/09/18, MRN [id number]  PCP:  Deland Pretty, MD  Cardiologist:   Minus Breeding, MD   No chief complaint on file.     History of Present Illness: Heather Olsen is a 86 y.o. female who presents for follow up of CAD. She had stent placement in 2008 and was followed by [facility].  She had an LAD stent placed in 2008 which was a 3.8 mm Vision. She had follow-up cath in 2009 with 30%  narrowing of the stent but otherwise no disease. Stress perfusion study in 2014 was negative. She does have some tricuspid regurgitation and some mild mitral regurgitation.  Lexiscan Myoview was negative in 2017.   Since I last saw her she has had stenting of her SMA for abdominal pian.  Unfortunately, she continue to have pain.    She has an event monitor placed by her primary care provider.  Recently this demonstrated very short runs of paroxysmal atrial fibrillation.  I reviewed that today for this visit.  She did have occasional slow heart rates that were few and far between.  She has not had any presyncope or syncope associated with this.  She has not had any chest pressure, neck or arm discomfort.  She does have chronic shortness of breath with activities.  This has been baseline.  She is not describing resting shortness of breath, PND or orthopnea.   Past Medical History:  Diagnosis Date  . Aortic stenosis 04/12/2016  . Back pain 04/12/2016  . Balance disorder 04/12/2016  . Beat, premature ventricular 01/08/2016  . Coronary artery disease   . Depression   . Diverticulosis   . Hearing loss 04/12/2016  . Hiatal hernia 04/12/2016  . HLD (hyperlipidemia) 04/12/2016  . Hyperglycemia 09/14/2016  . Hypertension   . Idiopathic scoliosis 04/12/2016  . Major depression 04/12/2016  . Mitral regurgitation   . Osteoporosis, senile 04/12/2016  . Pernicious anemia   . Premature atrial contraction   . Raynaud's disease 04/12/2016  .  Scoliosis   . Spinal stenosis of lumbar region 04/12/2016  . Tricuspid valve prolapse     Past Surgical History:  Procedure Laterality Date  . APPENDECTOMY    . BREAST SURGERY    . broken wrist Right 2007  . CARPAL TUNNEL RELEASE Left 04/04/2017   Procedure: LEFT CARPAL TUNNEL RELEASE;  Surgeon: Daryll Brod, MD;  Location: [place];  Service: Orthopedics;  Laterality: Left;  REG/FAB  . CATARACT EXTRACTION W/ INTRAOCULAR LENS  IMPLANT, BILATERAL Bilateral 2017  . COLONOSCOPY  2016  . FOOT SURGERY Right   . PERIPHERAL VASCULAR INTERVENTION  11/11/2019   Procedure: PERIPHERAL VASCULAR INTERVENTION;  Surgeon: Waynetta Sandy, MD;  Location: [place] [facility];  Service: Cardiovascular;;  . TONSILLECTOMY    . VISCERAL ANGIOGRAPHY N/A 11/11/2019   Procedure: VISCERAL ANGIOGRAPHY;  Surgeon: Waynetta Sandy, MD;  Location: [place] [facility];  Service: Cardiovascular;  Laterality: N/A;     Current Outpatient Medications  Medication Sig Dispense Refill  . acetaminophen (TYLENOL) 650 MG CR tablet Take 650 mg by mouth every 8 (eight) hours as needed for pain.    [name] aspirin 81 MG chewable tablet Chew 81 mg by mouth. Take one daily    . celecoxib (CELEBREX) 200 MG capsule TAKE 1 CAPSULE BY MOUTH EVERY DAY 30 capsule 0  . Cholecalciferol (VITAMIN D3) 2000 units capsule Take 2,000  Units by mouth daily.     . clonazePAM (KLONOPIN) 0.5 MG tablet 0.25 mg.     . clopidogrel (PLAVIX) 75 MG tablet Take 1 tablet (75 mg total) by mouth daily. 30 tablet 3  . cyanocobalamin (,VITAMIN B-12,) 1000 MCG/ML injection Inject 1 each into the skin every 30 (thirty) days.    [name] ELIQUIS 2.5 MG TABS tablet Take 2.5 mg by mouth 2 (two) times daily.    [name] escitalopram (LEXAPRO) 10 MG tablet Take 15 mg by mouth daily.    [name] ipratropium (ATROVENT) 0.06 % nasal spray Use 2 sprays in each nostril up to three times daily before meals for sinus drainage. 30 mL 3  . Lactobacillus-Inulin  ([place] PO) Take by mouth. Take one daily for probiotic    . metoprolol succinate (TOPROL-XL) 25 MG 24 hr tablet Take 1 tablet (25 mg total) by mouth 2 (two) times daily. 180 tablet 3  . MULTIPLE VITAMINS-MINERALS PO Take 1 tablet by mouth daily.     . nitroGLYCERIN (NITROSTAT) 0.4 MG SL tablet Place under the tongue. Place one tablet under the tongue every 5 minutes as needed for chest pain. No more than 3    . pantoprazole (PROTONIX) 40 MG tablet Take 40 mg by mouth daily.    [name] rOPINIRole (REQUIP) 0.5 MG tablet Take 0.25 mg by mouth daily.    . rosuvastatin (CRESTOR) 10 MG tablet Take 1 tablet (10 mg total) by mouth at bedtime. 30 tablet 11  . traMADol (ULTRAM) 50 MG tablet TAKE 1 TABLET BY MOUTH EVERY 4 HOURS (Patient taking differently: Take 50 mg by mouth every 4 (four) hours as needed (pain). ) 75 tablet 0  . trimethoprim (TRIMPEX) 100 MG tablet Take 100 mg by mouth daily.     No current facility-administered medications for this visit.    Allergies:   Sulfa antibiotics, Topiramate, Verapamil, Zolpidem, Prevacid [lansoprazole], Ace inhibitors, Codeine, and Ibandronic acid    ROS:  Please see the history of present illness.   Otherwise, review of systems are positive for none .   All other systems are reviewed and negative.    PHYSICAL EXAM: VS:  BP (!) 148/80   Pulse 60   Temp 98.2 F (36.8 C) (Temporal)   Resp 15   Ht 5\' 4"  (1.626 m)   Wt 118 lb 6.4 oz (53.7 kg)   SpO2 95%   BMI 20.32 kg/m  , BMI Body mass index is 20.32 kg/m.  GENERAL: Frail appearing NECK:  No jugular venous distention, waveform within normal limits, carotid upstroke brisk and symmetric, no bruits, no thyromegaly LUNGS:  Clear to auscultation bilaterally CHEST:  Unremarkable HEART:  PMI not displaced or sustained,S1 and S2 within normal limits, no S3, no S4, no clicks, no rubs, 2 out of 6 apical systolic murmur radiating slightly at the aortic outflow tract, no diastolic murmurs ABD:   Flat, positive bowel sounds normal in frequency in pitch, no bruits, no rebound, no guarding, no midline pulsatile mass, no hepatomegaly, no splenomegaly EXT:  2 plus pulses throughout, no edema, no cyanosis no clubbing   EKG:  EKG is  ordered today. The ekg ordered today demonstrates sinus bradycardia, rate 60, left axis deviation, left anterior fascicular block, RSR prime V1 and V2, poor anterior R wave progression, first-degree AV block, no acute ST-T wave changes.     Recent Labs: 11/11/2019: BUN 27; Creatinine, Ser 1.00; Hemoglobin 14.3; Potassium 4.4; Sodium 136    Lipid Panel  Component Value Date/Time   CHOL 232 (H) 09/14/2016 1415   TRIG 168 (H) 09/14/2016 1415   HDL 99 09/14/2016 1415   CHOLHDL 2.3 09/14/2016 1415   VLDL 34 (H) 09/14/2016 1415   LDLCALC 99 09/14/2016 1415      Wt Readings from Last 3 Encounters:  02/14/20 118 lb 6.4 oz (53.7 kg)  12/30/19 114 lb (51.7 kg)  11/11/19 110 lb (49.9 kg)      Other studies Reviewed: Additional studies/ records that were reviewed today include: Event monitor from another physician office reviewed for this appointment Review of the above records demonstrates:  See elsewhere  ASSESSMENT AND PLAN:  CAD:   She is had no new symptoms consistent with ischemia.  She had a negative stress perfusion study in 2017.  No change in therapy.  ATRIAL FIB:  She has had brief runs of paroxysmal atrial fibrillation.  She has some bradycardia and first-degree AV block.  However, I think the tachypalpitations are fairly problematic and I would like to try to increase her metoprolol.  She was taking half a pill of 25 mg metoprolol in the morning and a full in the evening.  I am getting increase that to 1 pill twice daily.  If she has any lightheadedness or other symptoms she will let me know.  Of note she already had her Plavix stopped by her primary provider and I will stop her aspirin.  AORTIC STENOSIS:This was mild.  I would not  suspect this to be different by exam.  No change in therapy.  HTN:The blood pressure is at target.  Meds as above.  DYSPNEA:   We have talked about this and I think this is likely related to deconditioning, arthritic pains that she has as well as her rib cage abnormalities with her kyphoscoliosis.  I do not think further testing would be particularly helpful.  We talked about possible pulmonary function testing but she is not so overtly symptomatic and is unlikely this would contribute to a reversible treatment.   PREOP ENDOSCOPY:   She needs to have this done for her GI complaints and she would be at acceptable risk.  COVID EDUCATION:  She is vaccinated.     Current medicines are reviewed at length with the patient today.  The patient does not have  concerns regarding medicines.  The following changes have been made:  See above  Labs/ tests ordered today include:      No orders of the defined types were placed in this encounter.    Disposition:   FU with me four months.    Signed, Minus Breeding, MD  02/14/2020 5:07 PM    [place]

## 2020-02-14 ENCOUNTER — Other Ambulatory Visit: Payer: Self-pay

## 2020-02-14 ENCOUNTER — Ambulatory Visit (INDEPENDENT_AMBULATORY_CARE_PROVIDER_SITE_OTHER): Payer: Medicare Other | Admitting: Cardiology

## 2020-02-14 ENCOUNTER — Encounter: Payer: Self-pay | Admitting: Cardiology

## 2020-02-14 VITALS — BP 148/80 | HR 60 | Temp 98.2°F | Resp 15 | Ht 64.0 in | Wt 118.4 lb

## 2020-02-14 DIAGNOSIS — I2510 Atherosclerotic heart disease of native coronary artery without angina pectoris: Secondary | ICD-10-CM

## 2020-02-14 DIAGNOSIS — Z7189 Other specified counseling: Secondary | ICD-10-CM

## 2020-02-14 DIAGNOSIS — I10 Essential (primary) hypertension: Secondary | ICD-10-CM

## 2020-02-14 DIAGNOSIS — R0600 Dyspnea, unspecified: Secondary | ICD-10-CM

## 2020-02-14 DIAGNOSIS — R002 Palpitations: Secondary | ICD-10-CM | POA: Diagnosis not present

## 2020-02-14 DIAGNOSIS — I350 Nonrheumatic aortic (valve) stenosis: Secondary | ICD-10-CM | POA: Diagnosis not present

## 2020-02-14 MED ORDER — METOPROLOL SUCCINATE ER 25 MG PO TB24: 25.0000 mg | ORAL_TABLET | Freq: Two times a day (BID) | ORAL | 3 refills | Status: DC

## 2020-02-14 NOTE — Patient Instructions (Addendum)
Medication Instructions:  Increase metoprolol to 25mg -1 tablet twice a day *If you need a refill on your cardiac medications before your next appointment, please call your pharmacy*  Lab Work: None needed this visit  Testing/Procedures: None needed this visit  Follow-Up: At [facility], you and your health needs are our priority.  As part of our continuing mission to provide you with exceptional heart care, we have created designated Provider Care Teams.  These Care Teams include your primary Cardiologist (physician) and Advanced Practice Providers (APPs -  Physician Assistants and Nurse Practitioners) who all work together to provide you with the care you need, when you need it.  We recommend signing up for the patient portal called "MyChart".  Sign up information is provided on this After Visit Summary.  MyChart is used to connect with patients for Virtual Visits (Telemedicine).  Patients are able to view lab/test results, encounter notes, upcoming appointments, etc.  Non-urgent messages can be sent to your provider as well.   To learn more about what you can do with MyChart, go to [email or web address].    Your next appointment:   4 month(s)  You will receive a reminder letter in the mail two months in advance. If you don't receive a letter, please call our office to schedule the follow-up appointment.  The format for your next appointment:   In Person  Provider:   Minus Breeding, MD

## 2020-02-20 ENCOUNTER — Telehealth: Payer: Self-pay

## 2020-02-20 NOTE — Telephone Encounter (Signed)
   [facility] Pre-operative Risk Assessment    Request for surgical clearance:  1. What type of surgery is being performed? EGD   2. When is this surgery scheduled? 03/02/20   3. What type of clearance is required (medical clearance vs. Pharmacy clearance to hold med vs. Both)? Both  4. Are there any medications that need to be held prior to surgery and how long? Eliquis   5. Practice name and name of physician performing surgery? Dr. Dellis Filbert Medoff   6. What is your office phone number [phone]    7.   What is your office fax number [phone]  8.   Anesthesia type (None, local, MAC, general) ? Not specified   Meryl Crutch 02/20/2020, 8:51 AM  _________________________________________________________________   (provider comments below)

## 2020-02-20 NOTE — Telephone Encounter (Signed)
Patient with diagnosis of atrial fibrillation on Eliquis for anticoagulation.    Procedure: EGD Date of procedure: 03/02/20  CHADS2-VASc score of 5 (HTN, AGEx2, CAD, female)  CrCl 39.8 ml/min Platelet count 314  Per office protocol, patient can hold Eliquis for 2 days prior to procedure.

## 2020-02-20 NOTE — Telephone Encounter (Signed)
   Primary Cardiologist: Minus Breeding, MD  Chart reviewed as part of pre-operative protocol coverage.  Heather Olsen was last seen on 02/14/20 by Dr. Percival Spanish who noted that she is at acceptable risk for endoscopy.   Patient with diagnosis of atrial fibrillation on Eliquis for anticoagulation.    Procedure: EGD Date of procedure: 03/02/20  CHADS2-VASc score of 5 (HTN, AGEx2, CAD, female)  CrCl 39.8 ml/min Platelet count 314  Per office protocol, patient can hold Eliquis for 2 days prior to procedure.     I will route this recommendation to the requesting party via Epic fax function and remove from pre-op pool.  Please call with questions.  Daune Perch, NP 02/20/2020, 1:49 PM

## 2020-02-20 NOTE — Telephone Encounter (Signed)
Pt was cleared for endoscopy by Dr. Percival Spanish on 02/14/20. She was recently started on Eliquis for PAF noted on monitor.  I will route this to our pharmacy for recommendations on holding anticoagulation and then efax a bundled clearance to the requesting provider.

## 2020-02-25 DIAGNOSIS — Z1152 Encounter for screening for COVID-19: Secondary | ICD-10-CM | POA: Diagnosis not present

## 2020-02-25 DIAGNOSIS — M9903 Segmental and somatic dysfunction of lumbar region: Secondary | ICD-10-CM | POA: Diagnosis not present

## 2020-02-25 DIAGNOSIS — M62830 Muscle spasm of back: Secondary | ICD-10-CM | POA: Diagnosis not present

## 2020-02-25 DIAGNOSIS — M47816 Spondylosis without myelopathy or radiculopathy, lumbar region: Secondary | ICD-10-CM | POA: Diagnosis not present

## 2020-02-25 DIAGNOSIS — M9901 Segmental and somatic dysfunction of cervical region: Secondary | ICD-10-CM | POA: Diagnosis not present

## 2020-02-25 DIAGNOSIS — M47812 Spondylosis without myelopathy or radiculopathy, cervical region: Secondary | ICD-10-CM | POA: Diagnosis not present

## 2020-02-25 DIAGNOSIS — M9902 Segmental and somatic dysfunction of thoracic region: Secondary | ICD-10-CM | POA: Diagnosis not present

## 2020-02-26 DIAGNOSIS — M9902 Segmental and somatic dysfunction of thoracic region: Secondary | ICD-10-CM | POA: Diagnosis not present

## 2020-02-26 DIAGNOSIS — M19042 Primary osteoarthritis, left hand: Secondary | ICD-10-CM | POA: Diagnosis not present

## 2020-02-26 DIAGNOSIS — M79641 Pain in right hand: Secondary | ICD-10-CM | POA: Diagnosis not present

## 2020-02-26 DIAGNOSIS — I7300 Raynaud's syndrome without gangrene: Secondary | ICD-10-CM | POA: Diagnosis not present

## 2020-02-26 DIAGNOSIS — M1990 Unspecified osteoarthritis, unspecified site: Secondary | ICD-10-CM | POA: Diagnosis not present

## 2020-02-26 DIAGNOSIS — M9903 Segmental and somatic dysfunction of lumbar region: Secondary | ICD-10-CM | POA: Diagnosis not present

## 2020-02-26 DIAGNOSIS — M9901 Segmental and somatic dysfunction of cervical region: Secondary | ICD-10-CM | POA: Diagnosis not present

## 2020-02-26 DIAGNOSIS — M25569 Pain in unspecified knee: Secondary | ICD-10-CM | POA: Diagnosis not present

## 2020-02-26 DIAGNOSIS — M419 Scoliosis, unspecified: Secondary | ICD-10-CM | POA: Diagnosis not present

## 2020-02-26 DIAGNOSIS — M25439 Effusion, unspecified wrist: Secondary | ICD-10-CM | POA: Diagnosis not present

## 2020-02-26 DIAGNOSIS — M47816 Spondylosis without myelopathy or radiculopathy, lumbar region: Secondary | ICD-10-CM | POA: Diagnosis not present

## 2020-02-26 DIAGNOSIS — M79643 Pain in unspecified hand: Secondary | ICD-10-CM | POA: Diagnosis not present

## 2020-02-26 DIAGNOSIS — M2550 Pain in unspecified joint: Secondary | ICD-10-CM | POA: Diagnosis not present

## 2020-02-26 DIAGNOSIS — M25539 Pain in unspecified wrist: Secondary | ICD-10-CM | POA: Diagnosis not present

## 2020-02-26 DIAGNOSIS — M1120 Other chondrocalcinosis, unspecified site: Secondary | ICD-10-CM | POA: Diagnosis not present

## 2020-02-26 DIAGNOSIS — M79642 Pain in left hand: Secondary | ICD-10-CM | POA: Diagnosis not present

## 2020-02-26 DIAGNOSIS — M47812 Spondylosis without myelopathy or radiculopathy, cervical region: Secondary | ICD-10-CM | POA: Diagnosis not present

## 2020-02-26 DIAGNOSIS — M19041 Primary osteoarthritis, right hand: Secondary | ICD-10-CM | POA: Diagnosis not present

## 2020-02-26 DIAGNOSIS — M62830 Muscle spasm of back: Secondary | ICD-10-CM | POA: Diagnosis not present

## 2020-02-27 DIAGNOSIS — E538 Deficiency of other specified B group vitamins: Secondary | ICD-10-CM | POA: Diagnosis not present

## 2020-03-02 DIAGNOSIS — M9903 Segmental and somatic dysfunction of lumbar region: Secondary | ICD-10-CM | POA: Diagnosis not present

## 2020-03-02 DIAGNOSIS — M9901 Segmental and somatic dysfunction of cervical region: Secondary | ICD-10-CM | POA: Diagnosis not present

## 2020-03-02 DIAGNOSIS — M47812 Spondylosis without myelopathy or radiculopathy, cervical region: Secondary | ICD-10-CM | POA: Diagnosis not present

## 2020-03-02 DIAGNOSIS — M62830 Muscle spasm of back: Secondary | ICD-10-CM | POA: Diagnosis not present

## 2020-03-02 DIAGNOSIS — M9902 Segmental and somatic dysfunction of thoracic region: Secondary | ICD-10-CM | POA: Diagnosis not present

## 2020-03-02 DIAGNOSIS — M47816 Spondylosis without myelopathy or radiculopathy, lumbar region: Secondary | ICD-10-CM | POA: Diagnosis not present

## 2020-03-03 DIAGNOSIS — M47812 Spondylosis without myelopathy or radiculopathy, cervical region: Secondary | ICD-10-CM | POA: Diagnosis not present

## 2020-03-03 DIAGNOSIS — M9902 Segmental and somatic dysfunction of thoracic region: Secondary | ICD-10-CM | POA: Diagnosis not present

## 2020-03-03 DIAGNOSIS — M62830 Muscle spasm of back: Secondary | ICD-10-CM | POA: Diagnosis not present

## 2020-03-03 DIAGNOSIS — M47816 Spondylosis without myelopathy or radiculopathy, lumbar region: Secondary | ICD-10-CM | POA: Diagnosis not present

## 2020-03-03 DIAGNOSIS — M9903 Segmental and somatic dysfunction of lumbar region: Secondary | ICD-10-CM | POA: Diagnosis not present

## 2020-03-03 DIAGNOSIS — M9901 Segmental and somatic dysfunction of cervical region: Secondary | ICD-10-CM | POA: Diagnosis not present

## 2020-03-10 DIAGNOSIS — M47812 Spondylosis without myelopathy or radiculopathy, cervical region: Secondary | ICD-10-CM | POA: Diagnosis not present

## 2020-03-10 DIAGNOSIS — M9901 Segmental and somatic dysfunction of cervical region: Secondary | ICD-10-CM | POA: Diagnosis not present

## 2020-03-10 DIAGNOSIS — Z1152 Encounter for screening for COVID-19: Secondary | ICD-10-CM | POA: Diagnosis not present

## 2020-03-10 DIAGNOSIS — M9902 Segmental and somatic dysfunction of thoracic region: Secondary | ICD-10-CM | POA: Diagnosis not present

## 2020-03-10 DIAGNOSIS — M47816 Spondylosis without myelopathy or radiculopathy, lumbar region: Secondary | ICD-10-CM | POA: Diagnosis not present

## 2020-03-10 DIAGNOSIS — M9903 Segmental and somatic dysfunction of lumbar region: Secondary | ICD-10-CM | POA: Diagnosis not present

## 2020-03-10 DIAGNOSIS — M62830 Muscle spasm of back: Secondary | ICD-10-CM | POA: Diagnosis not present

## 2020-03-16 DIAGNOSIS — K2930 Chronic superficial gastritis without bleeding: Secondary | ICD-10-CM | POA: Diagnosis not present

## 2020-03-16 DIAGNOSIS — K317 Polyp of stomach and duodenum: Secondary | ICD-10-CM | POA: Diagnosis not present

## 2020-03-16 DIAGNOSIS — R1012 Left upper quadrant pain: Secondary | ICD-10-CM | POA: Diagnosis not present

## 2020-03-17 DIAGNOSIS — M9903 Segmental and somatic dysfunction of lumbar region: Secondary | ICD-10-CM | POA: Diagnosis not present

## 2020-03-17 DIAGNOSIS — M47812 Spondylosis without myelopathy or radiculopathy, cervical region: Secondary | ICD-10-CM | POA: Diagnosis not present

## 2020-03-17 DIAGNOSIS — M9901 Segmental and somatic dysfunction of cervical region: Secondary | ICD-10-CM | POA: Diagnosis not present

## 2020-03-17 DIAGNOSIS — M62830 Muscle spasm of back: Secondary | ICD-10-CM | POA: Diagnosis not present

## 2020-03-17 DIAGNOSIS — M47816 Spondylosis without myelopathy or radiculopathy, lumbar region: Secondary | ICD-10-CM | POA: Diagnosis not present

## 2020-03-17 DIAGNOSIS — M9902 Segmental and somatic dysfunction of thoracic region: Secondary | ICD-10-CM | POA: Diagnosis not present

## 2020-03-18 DIAGNOSIS — M47812 Spondylosis without myelopathy or radiculopathy, cervical region: Secondary | ICD-10-CM | POA: Diagnosis not present

## 2020-03-18 DIAGNOSIS — M47816 Spondylosis without myelopathy or radiculopathy, lumbar region: Secondary | ICD-10-CM | POA: Diagnosis not present

## 2020-03-18 DIAGNOSIS — M62830 Muscle spasm of back: Secondary | ICD-10-CM | POA: Diagnosis not present

## 2020-03-18 DIAGNOSIS — M9902 Segmental and somatic dysfunction of thoracic region: Secondary | ICD-10-CM | POA: Diagnosis not present

## 2020-03-18 DIAGNOSIS — M9903 Segmental and somatic dysfunction of lumbar region: Secondary | ICD-10-CM | POA: Diagnosis not present

## 2020-03-18 DIAGNOSIS — M9901 Segmental and somatic dysfunction of cervical region: Secondary | ICD-10-CM | POA: Diagnosis not present

## 2020-03-24 DIAGNOSIS — M9901 Segmental and somatic dysfunction of cervical region: Secondary | ICD-10-CM | POA: Diagnosis not present

## 2020-03-24 DIAGNOSIS — M47816 Spondylosis without myelopathy or radiculopathy, lumbar region: Secondary | ICD-10-CM | POA: Diagnosis not present

## 2020-03-24 DIAGNOSIS — M62830 Muscle spasm of back: Secondary | ICD-10-CM | POA: Diagnosis not present

## 2020-03-24 DIAGNOSIS — M47812 Spondylosis without myelopathy or radiculopathy, cervical region: Secondary | ICD-10-CM | POA: Diagnosis not present

## 2020-03-24 DIAGNOSIS — M9903 Segmental and somatic dysfunction of lumbar region: Secondary | ICD-10-CM | POA: Diagnosis not present

## 2020-03-24 DIAGNOSIS — M9902 Segmental and somatic dysfunction of thoracic region: Secondary | ICD-10-CM | POA: Diagnosis not present

## 2020-03-25 DIAGNOSIS — M9902 Segmental and somatic dysfunction of thoracic region: Secondary | ICD-10-CM | POA: Diagnosis not present

## 2020-03-25 DIAGNOSIS — M47816 Spondylosis without myelopathy or radiculopathy, lumbar region: Secondary | ICD-10-CM | POA: Diagnosis not present

## 2020-03-25 DIAGNOSIS — M9901 Segmental and somatic dysfunction of cervical region: Secondary | ICD-10-CM | POA: Diagnosis not present

## 2020-03-25 DIAGNOSIS — M62830 Muscle spasm of back: Secondary | ICD-10-CM | POA: Diagnosis not present

## 2020-03-25 DIAGNOSIS — M47812 Spondylosis without myelopathy or radiculopathy, cervical region: Secondary | ICD-10-CM | POA: Diagnosis not present

## 2020-03-25 DIAGNOSIS — M9903 Segmental and somatic dysfunction of lumbar region: Secondary | ICD-10-CM | POA: Diagnosis not present

## 2020-03-26 DIAGNOSIS — M419 Scoliosis, unspecified: Secondary | ICD-10-CM | POA: Diagnosis not present

## 2020-03-26 DIAGNOSIS — L438 Other lichen planus: Secondary | ICD-10-CM | POA: Diagnosis not present

## 2020-03-26 DIAGNOSIS — M1990 Unspecified osteoarthritis, unspecified site: Secondary | ICD-10-CM | POA: Diagnosis not present

## 2020-03-26 DIAGNOSIS — M25439 Effusion, unspecified wrist: Secondary | ICD-10-CM | POA: Diagnosis not present

## 2020-03-26 DIAGNOSIS — M25539 Pain in unspecified wrist: Secondary | ICD-10-CM | POA: Diagnosis not present

## 2020-03-26 DIAGNOSIS — M25569 Pain in unspecified knee: Secondary | ICD-10-CM | POA: Diagnosis not present

## 2020-03-26 DIAGNOSIS — L814 Other melanin hyperpigmentation: Secondary | ICD-10-CM | POA: Diagnosis not present

## 2020-03-26 DIAGNOSIS — M79643 Pain in unspecified hand: Secondary | ICD-10-CM | POA: Diagnosis not present

## 2020-03-26 DIAGNOSIS — L821 Other seborrheic keratosis: Secondary | ICD-10-CM | POA: Diagnosis not present

## 2020-03-26 DIAGNOSIS — D1801 Hemangioma of skin and subcutaneous tissue: Secondary | ICD-10-CM | POA: Diagnosis not present

## 2020-03-26 DIAGNOSIS — L720 Epidermal cyst: Secondary | ICD-10-CM | POA: Diagnosis not present

## 2020-03-26 DIAGNOSIS — M1120 Other chondrocalcinosis, unspecified site: Secondary | ICD-10-CM | POA: Diagnosis not present

## 2020-03-26 DIAGNOSIS — I7300 Raynaud's syndrome without gangrene: Secondary | ICD-10-CM | POA: Diagnosis not present

## 2020-03-26 DIAGNOSIS — M1189 Other specified crystal arthropathies, multiple sites: Secondary | ICD-10-CM | POA: Diagnosis not present

## 2020-03-26 DIAGNOSIS — M2550 Pain in unspecified joint: Secondary | ICD-10-CM | POA: Diagnosis not present

## 2020-03-30 DIAGNOSIS — M47812 Spondylosis without myelopathy or radiculopathy, cervical region: Secondary | ICD-10-CM | POA: Diagnosis not present

## 2020-03-30 DIAGNOSIS — M9901 Segmental and somatic dysfunction of cervical region: Secondary | ICD-10-CM | POA: Diagnosis not present

## 2020-03-30 DIAGNOSIS — M9902 Segmental and somatic dysfunction of thoracic region: Secondary | ICD-10-CM | POA: Diagnosis not present

## 2020-03-30 DIAGNOSIS — M47816 Spondylosis without myelopathy or radiculopathy, lumbar region: Secondary | ICD-10-CM | POA: Diagnosis not present

## 2020-03-30 DIAGNOSIS — M62830 Muscle spasm of back: Secondary | ICD-10-CM | POA: Diagnosis not present

## 2020-03-30 DIAGNOSIS — M9903 Segmental and somatic dysfunction of lumbar region: Secondary | ICD-10-CM | POA: Diagnosis not present

## 2020-04-01 DIAGNOSIS — M47812 Spondylosis without myelopathy or radiculopathy, cervical region: Secondary | ICD-10-CM | POA: Diagnosis not present

## 2020-04-01 DIAGNOSIS — M9903 Segmental and somatic dysfunction of lumbar region: Secondary | ICD-10-CM | POA: Diagnosis not present

## 2020-04-01 DIAGNOSIS — M9902 Segmental and somatic dysfunction of thoracic region: Secondary | ICD-10-CM | POA: Diagnosis not present

## 2020-04-01 DIAGNOSIS — M47816 Spondylosis without myelopathy or radiculopathy, lumbar region: Secondary | ICD-10-CM | POA: Diagnosis not present

## 2020-04-01 DIAGNOSIS — M9901 Segmental and somatic dysfunction of cervical region: Secondary | ICD-10-CM | POA: Diagnosis not present

## 2020-04-01 DIAGNOSIS — M62830 Muscle spasm of back: Secondary | ICD-10-CM | POA: Diagnosis not present

## 2020-04-13 DIAGNOSIS — M9901 Segmental and somatic dysfunction of cervical region: Secondary | ICD-10-CM | POA: Diagnosis not present

## 2020-04-13 DIAGNOSIS — M47816 Spondylosis without myelopathy or radiculopathy, lumbar region: Secondary | ICD-10-CM | POA: Diagnosis not present

## 2020-04-13 DIAGNOSIS — M47812 Spondylosis without myelopathy or radiculopathy, cervical region: Secondary | ICD-10-CM | POA: Diagnosis not present

## 2020-04-13 DIAGNOSIS — M9903 Segmental and somatic dysfunction of lumbar region: Secondary | ICD-10-CM | POA: Diagnosis not present

## 2020-04-13 DIAGNOSIS — M9902 Segmental and somatic dysfunction of thoracic region: Secondary | ICD-10-CM | POA: Diagnosis not present

## 2020-04-13 DIAGNOSIS — M62830 Muscle spasm of back: Secondary | ICD-10-CM | POA: Diagnosis not present

## 2020-05-25 ENCOUNTER — Other Ambulatory Visit: Payer: Self-pay | Admitting: Orthopedic Surgery

## 2020-05-25 ENCOUNTER — Telehealth: Payer: Self-pay | Admitting: *Deleted

## 2020-05-25 NOTE — Telephone Encounter (Addendum)
Patient with diagnosis of afib on Eliquis for anticoagulation.    Procedure: right carpal tunnel Date of procedure: 06/09/20  CHADS2-VASc score of 5 (age x2, sex, HTN, CAD)  CrCl 76mL/min Platelet count 316K  Per office protocol, patient can hold Eliquis for 1-2 days prior to procedure.

## 2020-05-25 NOTE — Telephone Encounter (Signed)
   Primary Cardiologist: Minus Breeding, MD  Chart reviewed as part of pre-operative protocol coverage. Patient actually has an appointment coming up with Dr. Percival Spanish on 06/02/20 for close 44-month follow-up. Would recommend she keep this appointment and discuss clearance and anticoagulation at that visit. Will add pre-op clearance to appointment notes. Per office protocol, the provider should assess clearance at time of office visit and should forward their finalized clearance decision to requesting party below. I will route to pharmD so protocol information about holding Eliquis is available to Dr. Percival Spanish when he evaluates patient. Will send message to [employer] so they are aware of upcoming appointment. Will route to callback to notify patient to discuss clearance at upcoming [place].   I will route as FYI to Dr. Percival Spanish for next week and remove this message from the pre-op box.      Charlie Pitter, PA-C 05/25/2020, 3:44 PM

## 2020-05-25 NOTE — Telephone Encounter (Signed)
   [facility] Pre-operative Risk Assessment    HEARTCARE STAFF: - Please ensure there is not already an duplicate clearance open for this procedure. - Under Visit Info/Reason for Call, type in Other and utilize the format Clearance MM/DD/YY or Clearance TBD. Do not use dashes or single digits. - If request is for dental extraction, please clarify the # of teeth to be extracted.  Request for surgical clearance:  1. What type of surgery is being performed? RIGHT CARPAL TUNNEL   2. When is this surgery scheduled? 06/09/20   3. What type of clearance is required (medical clearance vs. Pharmacy clearance to hold med vs. Both)? BOTH  4. Are there any medications that need to be held prior to surgery and how long? ELIQUIS   5. Practice name and name of physician performing surgery? [facility]; DR. Littleton   6. What is the office phone number? [phone]   7.   What is the office fax number? [phone]  8.   Anesthesia type (None, local, MAC, general) ? IV REGIONAL FOREARM BLOCK   Julaine Hua 05/25/2020, 2:57 PM  _________________________________________________________________   (provider comments below)

## 2020-05-26 NOTE — Telephone Encounter (Signed)
OK to hold her DOAC for two days prior to the procedure.

## 2020-05-26 NOTE — Telephone Encounter (Signed)
Medical clearance to be finalized/routed at time of 7/6 OV as below.

## 2020-05-31 ENCOUNTER — Encounter: Payer: Self-pay | Admitting: Cardiology

## 2020-05-31 DIAGNOSIS — I480 Paroxysmal atrial fibrillation: Secondary | ICD-10-CM | POA: Insufficient documentation

## 2020-05-31 NOTE — Progress Notes (Signed)
Cardiology Office Note   Date:  06/02/2020   ID:  Rashaunda Mallorey, Odonell [birth year], MRN [id number]  PCP:  Deland Pretty, MD  Cardiologist:   Minus Breeding, MD   Chief Complaint  Patient presents with  . Atrial Fibrillation      History of Present Illness: Heather Olsen is a 85 y.o. female who presents for follow up of CAD. She had stent placement in 2008 and was followed by [facility].  She had an LAD stent placed in 2008 which was a 3.8 mm Vision. She had follow-up cath in 2009 with 30%  narrowing of the stent but otherwise no disease. Stress perfusion study in 2014 was negative. She does have some tricuspid regurgitation and some mild mitral regurgitation.  Lexiscan Myoview was negative in 2017.   Since I last saw her she has had stenting of her SMA for abdominal pian.  Unfortunately, she continue to have pain.  Fortunately that finally abated.  She has an event monitor placed by her primary care provider.  This demonstrated very short runs of paroxysmal atrial fibrillation.  At the last visit, for increased rate control I increased her beta blocker.  She does have a slow heart rate but she actually feels fine with this and has no presyncope or syncope.  She does not have any decreased exercise tolerance.  She is really limited by back pain.  She does do her activities of daily living including some grocery shopping.  She does not have any chest pressure, neck or arm discomfort.  She has no shortness of breath, PND or orthopnea.  Of note she is going to have carpal tunnel surgery.    Past Medical History:  Diagnosis Date  . Aortic stenosis 04/12/2016  . Coronary artery disease   . Depression   . Diverticulosis   . Hearing loss 04/12/2016  . Hiatal hernia 04/12/2016  . HLD (hyperlipidemia) 04/12/2016  . Hyperglycemia 09/14/2016  . Hypertension   . Idiopathic scoliosis 04/12/2016  . Major depression 04/12/2016  . Mitral regurgitation   . Osteoporosis, senile 04/12/2016  .  Pernicious anemia   . Raynaud's disease 04/12/2016  . Scoliosis   . Spinal stenosis of lumbar region 04/12/2016    Past Surgical History:  Procedure Laterality Date  . APPENDECTOMY    . BREAST SURGERY    . broken wrist Right 2007  . CARPAL TUNNEL RELEASE Left 04/04/2017   Procedure: LEFT CARPAL TUNNEL RELEASE;  Surgeon: Daryll Brod, MD;  Location: [facility];  Service: Orthopedics;  Laterality: Left;  REG/FAB  . CATARACT EXTRACTION W/ INTRAOCULAR LENS  IMPLANT, BILATERAL Bilateral 2017  . COLONOSCOPY  2016  . FOOT SURGERY Right   . PERIPHERAL VASCULAR INTERVENTION  11/11/2019   Procedure: PERIPHERAL VASCULAR INTERVENTION;  Surgeon: Waynetta Sandy, MD;  Location: [facility];  Service: Cardiovascular;;  . TONSILLECTOMY    . VISCERAL ANGIOGRAPHY N/A 11/11/2019   Procedure: VISCERAL ANGIOGRAPHY;  Surgeon: Waynetta Sandy, MD;  Location: [facility];  Service: Cardiovascular;  Laterality: N/A;     Current Outpatient Medications  Medication Sig Dispense Refill  . acetaminophen (TYLENOL) 650 MG CR tablet Take 650 mg by mouth every 8 (eight) hours as needed for pain.    . celecoxib (CELEBREX) 200 MG capsule TAKE 1 CAPSULE BY MOUTH EVERY DAY 30 capsule 0  . Cholecalciferol (VITAMIN D3) 2000 units capsule Take 2,000 Units by mouth daily.     . clonazePAM (KLONOPIN)  0.5 MG tablet 0.25 mg.     . cyanocobalamin (,VITAMIN B-12,) 1000 MCG/ML injection Inject 1 each into the skin every 30 (thirty) days.    [name] ELIQUIS 2.5 MG TABS tablet Take 2.5 mg by mouth 2 (two) times daily.    [name] escitalopram (LEXAPRO) 10 MG tablet Take 15 mg by mouth daily.    [name] ipratropium (ATROVENT) 0.06 % nasal spray Use 2 sprays in each nostril up to three times daily before meals for sinus drainage. 30 mL 3  . Lactobacillus-Inulin (Will PO) Take by mouth. Take one daily for probiotic    . metoprolol succinate (TOPROL-XL) 25 MG 24 hr tablet Take 1 tablet  (25 mg total) by mouth 2 (two) times daily. 180 tablet 3  . MULTIPLE VITAMINS-MINERALS PO Take 1 tablet by mouth daily.     . nitroGLYCERIN (NITROSTAT) 0.4 MG SL tablet Place under the tongue. Place one tablet under the tongue every 5 minutes as needed for chest pain. No more than 3    . pantoprazole (PROTONIX) 40 MG tablet Take 40 mg by mouth daily.    [name] rOPINIRole (REQUIP) 0.5 MG tablet Take 0.25 mg by mouth daily.    . rosuvastatin (CRESTOR) 10 MG tablet Take 1 tablet (10 mg total) by mouth at bedtime. 30 tablet 11  . traMADol (ULTRAM) 50 MG tablet TAKE 1 TABLET BY MOUTH EVERY 4 HOURS (Patient taking differently: Take 50 mg by mouth every 4 (four) hours as needed (pain). ) 75 tablet 0  . trimethoprim (TRIMPEX) 100 MG tablet Take 100 mg by mouth daily.     No current facility-administered medications for this visit.    Allergies:   Sulfa antibiotics, Topiramate, Verapamil, Zolpidem, Prevacid [lansoprazole], Ace inhibitors, Codeine, and Ibandronic acid    ROS:  Please see the history of present illness.   Otherwise, review of systems are positive for none.   All other systems are reviewed and negative.    PHYSICAL EXAM: VS:  BP (!) 162/90   Pulse (!) 47   Ht 5\' 4"  (1.626 m)   Wt 115 lb (52.2 kg)   SpO2 92%   BMI 19.74 kg/m  , BMI Body mass index is 19.74 kg/m.  GENERAL:  Well appearing NECK:  No jugular venous distention, waveform within normal limits, carotid upstroke brisk and symmetric, no bruits, no thyromegaly LUNGS:  Clear to auscultation bilaterally CHEST:  Unremarkable HEART:  PMI not displaced or sustained,S1 and S2 within normal limits, no S3, no S4, no clicks, no rubs, 3 out of 6 holosystolic murmur heard best at the left 3rd-4th intercostal space, no diastolic murmurs ABD:  Flat, positive bowel sounds normal in frequency in pitch, no bruits, no rebound, no guarding, no midline pulsatile mass, no hepatomegaly, no splenomegaly EXT:  2 plus pulses throughout, no edema, no  cyanosis no clubbing   EKG:  EKG is  ordered today. The ekg ordered today demonstrates sinus bradycardia, rate 47, left axis deviation, left anterior fascicular block, RSR prime V1 and V2, poor anterior R wave progression, first-degree AV block, no acute ST-T wave changes.     Recent Labs: 11/11/2019: BUN 27; Creatinine, Ser 1.00; Hemoglobin 14.3; Potassium 4.4; Sodium 136    Lipid Panel    Component Value Date/Time   CHOL 232 (H) 09/14/2016 1415   TRIG 168 (H) 09/14/2016 1415   HDL 99 09/14/2016 1415   CHOLHDL 2.3 09/14/2016 1415   VLDL 34 (H) 09/14/2016 1415   LDLCALC 99 09/14/2016  1415      Wt Readings from Last 3 Encounters:  06/02/20 115 lb (52.2 kg)  02/14/20 118 lb 6.4 oz (53.7 kg)  12/30/19 114 lb (51.7 kg)      Other studies Reviewed: Additional studies/ records that were reviewed today include: None Review of the above records demonstrates:  NA  ASSESSMENT AND PLAN:  CAD:   The patient has no new symptoms since her 2017 stress test.  No further testing is indicated.  She will continue with risk reduction.  ATRIAL FIB:  She has had brief runs of paroxysmal atrial fibrillation.  She has some bradycardia and first-degree AV block.  Tachypalpitations were significant issue prior to increasing the beta-blocker.  She is much better and tolerates a low rates.  Therefore, I would continue this.  She tolerates anticoagulation.  Of note she can hold her Eliquis for 4 doses prior to her carpal tunnel surgery.   AORTIC STENOSIS: She has had mild AS.  She also has mild TR and MR.  I will follow this clinically.  She is not having any symptoms.  Her last imaging was by [place] in 2017.   HTN:The blood pressure is elevated today but she says it is not elevated at home and other appointments.  I did review some pressures that were much lower than this.  She has been given instructions on keeping a blood pressure diary and also was instructed on how to share that information  with this signing up via My Chart.  I will make further adjustments based on this.  PREOP:   The patient has no high risk findings.  She is not going for high risk surgery.  Therefore, based on current ACC/AHA guidelines the patient is at acceptable risk for carpal tunnel surgery.   COVID EDUCATION:  She is vaccinated.     Current medicines are reviewed at length with the patient today.  The patient does not have  concerns regarding medicines.  The following changes have been made:  See above  Labs/ tests ordered today include:      Orders Placed This Encounter  Procedures  . EKG 12-Lead     Disposition:   FU with me four months.    Signed, Minus Breeding, MD  06/02/2020 4:03 PM    [facility]

## 2020-06-02 ENCOUNTER — Other Ambulatory Visit: Payer: Self-pay

## 2020-06-02 ENCOUNTER — Ambulatory Visit (INDEPENDENT_AMBULATORY_CARE_PROVIDER_SITE_OTHER): Payer: Medicare Other | Admitting: Cardiology

## 2020-06-02 ENCOUNTER — Encounter: Payer: Self-pay | Admitting: Cardiology

## 2020-06-02 VITALS — BP 162/90 | HR 47 | Ht 64.0 in | Wt 115.0 lb

## 2020-06-02 DIAGNOSIS — I350 Nonrheumatic aortic (valve) stenosis: Secondary | ICD-10-CM | POA: Diagnosis not present

## 2020-06-02 DIAGNOSIS — I480 Paroxysmal atrial fibrillation: Secondary | ICD-10-CM | POA: Diagnosis not present

## 2020-06-02 DIAGNOSIS — I2510 Atherosclerotic heart disease of native coronary artery without angina pectoris: Secondary | ICD-10-CM | POA: Diagnosis not present

## 2020-06-02 NOTE — Patient Instructions (Signed)
Medication Instructions:  Continue current medications  Per Dr. Percival Spanish, [place] to hold Eliquis for 4 doses (which is 2 days) prior to carpal tunnel surgery   *If you need a refill on your cardiac medications before your next appointment, please call your pharmacy*   Lab Work: NONE If you have labs (blood work) drawn today and your tests are completely normal, you will receive your results only by:  [place] (if you have MyChart) OR  A paper copy in the mail If you have any lab test that is abnormal or we need to change your treatment, we will call you to review the results.   Testing/Procedures: NONE   Follow-Up: At [facility], you and your health needs are our priority.  As part of our continuing mission to provide you with exceptional heart care, we have created designated Provider Care Teams.  These Care Teams include your primary Cardiologist (physician) and Advanced Practice Providers (APPs -  Physician Assistants and Nurse Practitioners) who all work together to provide you with the care you need, when you need it.  We recommend signing up for the patient portal called "MyChart".  Sign up information is provided on this After Visit Summary.  MyChart is used to connect with patients for Virtual Visits (Telemedicine).  Patients are able to view lab/test results, encounter notes, upcoming appointments, etc.  Non-urgent messages can be sent to your provider as well.   To learn more about what you can do with MyChart, go to [email or web address].    Your next appointment:   12 month(s)  The format for your next appointment:   In Person  Provider:   You may see Minus Breeding, MD or one of the following Advanced Practice Providers on your designated Care Team:    Rosaria Ferries, PA-C  Jory Sims, DNP, ANP  Cadence Kathlen Mody, NP    Other Instructions

## 2020-06-04 ENCOUNTER — Other Ambulatory Visit: Payer: Self-pay

## 2020-06-04 ENCOUNTER — Encounter (HOSPITAL_BASED_OUTPATIENT_CLINIC_OR_DEPARTMENT_OTHER): Payer: Self-pay | Admitting: Orthopedic Surgery

## 2020-06-04 DIAGNOSIS — M545 Low back pain: Secondary | ICD-10-CM | POA: Diagnosis not present

## 2020-06-05 ENCOUNTER — Other Ambulatory Visit (HOSPITAL_COMMUNITY)
Admission: RE | Admit: 2020-06-05 | Discharge: 2020-06-05 | Disposition: A | Discharge status: Home / Self Care | Payer: Medicare Other | Source: Ambulatory Visit | Attending: Orthopedic Surgery | Admitting: Orthopedic Surgery

## 2020-06-05 DIAGNOSIS — Z20822 Contact with and (suspected) exposure to covid-19: Secondary | ICD-10-CM | POA: Diagnosis not present

## 2020-06-05 DIAGNOSIS — Z01812 Encounter for preprocedural laboratory examination: Secondary | ICD-10-CM | POA: Diagnosis not present

## 2020-06-05 LAB — SARS CORONAVIRUS 2 (TAT 6-24 HRS): SARS Coronavirus 2: NEGATIVE

## 2020-06-05 NOTE — Progress Notes (Signed)

## 2020-06-08 ENCOUNTER — Encounter (HOSPITAL_COMMUNITY): Payer: Self-pay | Admitting: Anesthesiology

## 2020-06-09 ENCOUNTER — Ambulatory Visit (HOSPITAL_BASED_OUTPATIENT_CLINIC_OR_DEPARTMENT_OTHER): Admission: RE | Admit: 2020-06-09 | Payer: Medicare Other | Source: Home / Self Care | Admitting: Orthopedic Surgery

## 2020-06-09 SURGERY — CARPAL TUNNEL RELEASE
Anesthesia: Choice | Laterality: Right

## 2020-06-09 NOTE — Anesthesia Preprocedure Evaluation (Deleted)
Anesthesia Evaluation  Patient identified by MRN, date of birth, ID band Patient awake    Reviewed: Allergy & Precautions, NPO status , Patient's Chart, lab work & pertinent test results, reviewed documented beta blocker date and time   History of Anesthesia Complications Negative for: history of anesthetic complications  Airway        Dental   Pulmonary neg pulmonary ROS,           Cardiovascular hypertension, Pt. on medications and Pt. on home beta blockers + CAD  + dysrhythmias (on Eliquis) Atrial Fibrillation   TTE 2017: EF 60-65%, mild MR, moderate TR, moderate LAE/RAE, grade 2 DD, mild pulmonary HTN (RVSP 63mmHg)   Neuro/Psych Depression negative neurological ROS     GI/Hepatic Neg liver ROS, hiatal hernia, GERD  Medicated and Controlled,  Endo/Other  negative endocrine ROS  Renal/GU negative Renal ROS  negative genitourinary   Musculoskeletal negative musculoskeletal ROS (+)   Abdominal   Peds  Hematology negative hematology ROS (+)   Anesthesia Other Findings Day of surgery medications reviewed with patient.  Reproductive/Obstetrics negative OB ROS                            Anesthesia Physical Anesthesia Plan  ASA:   Anesthesia Plan:    Post-op Pain Management:    Induction:   PONV Risk Score and Plan:   Airway Management Planned:   Additional Equipment:   Intra-op Plan:   Post-operative Plan:   Informed Consent:   Plan Discussed with:   Anesthesia Plan Comments: (On arrival to [facility], patient's husband reportedly came inside and asked for medical assistance for the patient who was acutely weak/fatigued and unable to walk inside unassisted (patient does not normally use any assistive devices). EMS was called who evaluated patient outside prior to checking into [facility]. Per their report, vital signs and CBG were normal. Transport and evaluation in ED were recommended but  patient declined. I saw the patient after this evaluation and she was awake and alert, sitting in chair in preoperative room. She states that she remembers all events of the morning and began to feel weak, tired, and had "tunnel vision" in the car on the way to [facility] but never lost consciousness. She states that the same happens on a somewhat regular basis when she has not had any food and that she had a light dinner last night. She denies any other symptoms such as chest pain, SOB, or focal weakness. I have advised against proceeding with an anesthetic/surgery today in light of these events and recommended that she be evaluated in the ED as her symptoms may represent a life-threatening condition. She declines further evaluation at this time. Daiva Huge, MD)       Anesthesia Quick Evaluation

## 2020-06-11 DIAGNOSIS — Z130 Encounter for screening for diseases of the blood and blood-forming organs and certain disorders involving the immune mechanism: Secondary | ICD-10-CM | POA: Diagnosis not present

## 2020-06-11 DIAGNOSIS — M8580 Other specified disorders of bone density and structure, unspecified site: Secondary | ICD-10-CM | POA: Diagnosis not present

## 2020-06-11 DIAGNOSIS — M4120 Other idiopathic scoliosis, site unspecified: Secondary | ICD-10-CM | POA: Diagnosis not present

## 2020-06-11 DIAGNOSIS — M545 Low back pain: Secondary | ICD-10-CM | POA: Diagnosis not present

## 2020-06-11 DIAGNOSIS — R29898 Other symptoms and signs involving the musculoskeletal system: Secondary | ICD-10-CM | POA: Diagnosis not present

## 2020-06-11 DIAGNOSIS — R55 Syncope and collapse: Secondary | ICD-10-CM | POA: Diagnosis not present

## 2020-06-11 DIAGNOSIS — I341 Nonrheumatic mitral (valve) prolapse: Secondary | ICD-10-CM | POA: Diagnosis not present

## 2020-06-11 DIAGNOSIS — R7989 Other specified abnormal findings of blood chemistry: Secondary | ICD-10-CM | POA: Diagnosis not present

## 2020-06-11 DIAGNOSIS — R2689 Other abnormalities of gait and mobility: Secondary | ICD-10-CM | POA: Diagnosis not present

## 2020-06-29 DIAGNOSIS — G2581 Restless legs syndrome: Secondary | ICD-10-CM | POA: Diagnosis not present

## 2020-06-29 DIAGNOSIS — I951 Orthostatic hypotension: Secondary | ICD-10-CM | POA: Diagnosis not present

## 2020-06-29 DIAGNOSIS — R7309 Other abnormal glucose: Secondary | ICD-10-CM | POA: Diagnosis not present

## 2020-06-29 DIAGNOSIS — R0789 Other chest pain: Secondary | ICD-10-CM | POA: Diagnosis not present

## 2020-06-29 DIAGNOSIS — R748 Abnormal levels of other serum enzymes: Secondary | ICD-10-CM | POA: Diagnosis not present

## 2020-07-01 ENCOUNTER — Other Ambulatory Visit: Payer: Self-pay

## 2020-07-01 ENCOUNTER — Ambulatory Visit (INDEPENDENT_AMBULATORY_CARE_PROVIDER_SITE_OTHER): Payer: Medicare Other | Admitting: Physician Assistant

## 2020-07-01 ENCOUNTER — Ambulatory Visit (HOSPITAL_COMMUNITY)
Admission: RE | Admit: 2020-07-01 | Discharge: 2020-07-01 | Disposition: A | Discharge status: Home / Self Care | Payer: Medicare Other | Source: Ambulatory Visit | Attending: Vascular Surgery | Admitting: Vascular Surgery

## 2020-07-01 VITALS — BP 168/91 | HR 49 | Temp 96.8°F | Resp 20 | Ht 64.0 in | Wt 114.4 lb

## 2020-07-01 DIAGNOSIS — K551 Chronic vascular disorders of intestine: Secondary | ICD-10-CM | POA: Diagnosis not present

## 2020-07-01 DIAGNOSIS — R55 Syncope and collapse: Secondary | ICD-10-CM | POA: Diagnosis not present

## 2020-07-01 DIAGNOSIS — I2510 Atherosclerotic heart disease of native coronary artery without angina pectoris: Secondary | ICD-10-CM
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
## 2020-07-02 ENCOUNTER — Other Ambulatory Visit: Payer: Self-pay | Admitting: *Deleted

## 2020-07-02 ENCOUNTER — Telehealth: Payer: Self-pay | Admitting: *Deleted

## 2020-07-02 DIAGNOSIS — R55 Syncope and collapse: Secondary | ICD-10-CM | POA: Diagnosis not present

## 2020-07-02 DIAGNOSIS — K551 Chronic vascular disorders of intestine: Secondary | ICD-10-CM

## 2020-07-02 NOTE — Telephone Encounter (Signed)
Patient returning call.

## 2020-07-02 NOTE — Telephone Encounter (Signed)
Spoke with pt, aware of dr hochrein's recommendations. Her PCP reviewed her recent zio patch monitor and they told her they found an abnormal rhythm and she has a follow up with dr hochrein regarding the monitor in 2 weeks. She will bring her bp log to that visit. She will call back if after stopping the metoprolol her bp trends high.

## 2020-07-02 NOTE — Telephone Encounter (Signed)
Left message for pt to call, dr hochrein reviewed bp and pulse log. bp ranges from 169-102/101-56 with pulse range from 65 to 44 bpm. Due to low heart rate and the patient symptoms of light headedness and seeing spots, Dr Percival Spanish would like the patient to stop metoprolol.

## 2020-07-06 NOTE — Telephone Encounter (Signed)
Pt c/o BP issue: STAT if pt c/o blurred vision, one-sided weakness or slurred speech  1. What are your last 5 BP readings?  07/01/20 169/91 HR 49 117/78 HR 47 (stopped all metoprolol on the evening of the 07/01/20) 07/02/20 142/82 HR 53 114/69 HR 72   07/03/20 150/90 HR 67  07/05/20 148/89 HR 105 ( had multiple episodes of Afib, took 1 metoprolol mid morning) 120/78 HR 53 07/06/20 122/79 HR 59 (2:00 pm) (1 metoprolol this morning)   2. Are you having any other symptoms (ex. Dizziness, headache, blurred vision, passed out)?  SOB on 07/05/20  3. What is your BP issue? Heather Olsen is calling to report her recent BP reading to Hilda Blades since stopping metoprolol on 07/05/20.

## 2020-07-06 NOTE — Telephone Encounter (Signed)
Spoke to patient she wanted Dr.Hochrein to know she had 3 episodes yesterday of fast heart beat.Stated she was told to stop metoprolol 15 days ago due to pulse ranging in high 40's.Stated she restarted metoprolol succinate 25 mg daily 2 days ago.Stated she feels better. B/P 122/79 pulse 59,120/78 pulse 53.Advised to monitor B/P and pulse and bring readings to appointment scheduled with Dr.Hochrein 8/23 at 1:30 pm.I will make Dr.Hochrein aware.

## 2020-07-07 DIAGNOSIS — I361 Nonrheumatic tricuspid (valve) insufficiency: Secondary | ICD-10-CM | POA: Diagnosis not present

## 2020-07-07 DIAGNOSIS — K5791 Diverticulosis of intestine, part unspecified, without perforation or abscess with bleeding: Secondary | ICD-10-CM | POA: Diagnosis not present

## 2020-07-07 DIAGNOSIS — R29898 Other symptoms and signs involving the musculoskeletal system: Secondary | ICD-10-CM | POA: Diagnosis not present

## 2020-07-07 DIAGNOSIS — Z955 Presence of coronary angioplasty implant and graft: Secondary | ICD-10-CM | POA: Diagnosis not present

## 2020-07-07 DIAGNOSIS — R2681 Unsteadiness on feet: Secondary | ICD-10-CM | POA: Diagnosis not present

## 2020-07-07 DIAGNOSIS — M545 Low back pain: Secondary | ICD-10-CM | POA: Diagnosis not present

## 2020-07-07 DIAGNOSIS — M6281 Muscle weakness (generalized): Secondary | ICD-10-CM | POA: Diagnosis not present

## 2020-07-07 DIAGNOSIS — D510 Vitamin B12 deficiency anemia due to intrinsic factor deficiency: Secondary | ICD-10-CM | POA: Diagnosis not present

## 2020-07-07 NOTE — Addendum Note (Signed)
Addended by: Kathyrn Lass on: 07/07/2020 04:54 PM   Modules accepted: Orders

## 2020-07-07 NOTE — Telephone Encounter (Signed)
OK to be on metoprolol again since she tolerates it and it seems to help her palpitations.

## 2020-07-07 NOTE — Telephone Encounter (Signed)
Spoke to patient Dr.Hochrein advised ok to take Metoprolol succ 25 mg daily.She stated her B/P today 135/85 pulse 58.Stated he will continue to check daily and bring reading to next appointment.

## 2020-07-09 DIAGNOSIS — M6281 Muscle weakness (generalized): Secondary | ICD-10-CM | POA: Diagnosis not present

## 2020-07-09 DIAGNOSIS — R29898 Other symptoms and signs involving the musculoskeletal system: Secondary | ICD-10-CM | POA: Diagnosis not present

## 2020-07-09 DIAGNOSIS — I361 Nonrheumatic tricuspid (valve) insufficiency: Secondary | ICD-10-CM | POA: Diagnosis not present

## 2020-07-09 DIAGNOSIS — M545 Low back pain: Secondary | ICD-10-CM | POA: Diagnosis not present

## 2020-07-09 DIAGNOSIS — R2681 Unsteadiness on feet: Secondary | ICD-10-CM | POA: Diagnosis not present

## 2020-07-09 DIAGNOSIS — D510 Vitamin B12 deficiency anemia due to intrinsic factor deficiency: Secondary | ICD-10-CM | POA: Diagnosis not present

## 2020-07-13 DIAGNOSIS — I361 Nonrheumatic tricuspid (valve) insufficiency: Secondary | ICD-10-CM | POA: Diagnosis not present

## 2020-07-13 DIAGNOSIS — M6281 Muscle weakness (generalized): Secondary | ICD-10-CM | POA: Diagnosis not present

## 2020-07-13 DIAGNOSIS — R2681 Unsteadiness on feet: Secondary | ICD-10-CM | POA: Diagnosis not present

## 2020-07-13 DIAGNOSIS — D510 Vitamin B12 deficiency anemia due to intrinsic factor deficiency: Secondary | ICD-10-CM | POA: Diagnosis not present

## 2020-07-13 DIAGNOSIS — R29898 Other symptoms and signs involving the musculoskeletal system: Secondary | ICD-10-CM | POA: Diagnosis not present

## 2020-07-13 DIAGNOSIS — M545 Low back pain: Secondary | ICD-10-CM | POA: Diagnosis not present

## 2020-07-15 DIAGNOSIS — R2681 Unsteadiness on feet: Secondary | ICD-10-CM | POA: Diagnosis not present

## 2020-07-15 DIAGNOSIS — D510 Vitamin B12 deficiency anemia due to intrinsic factor deficiency: Secondary | ICD-10-CM | POA: Diagnosis not present

## 2020-07-15 DIAGNOSIS — R29898 Other symptoms and signs involving the musculoskeletal system: Secondary | ICD-10-CM | POA: Diagnosis not present

## 2020-07-15 DIAGNOSIS — I361 Nonrheumatic tricuspid (valve) insufficiency: Secondary | ICD-10-CM | POA: Diagnosis not present

## 2020-07-15 DIAGNOSIS — M545 Low back pain: Secondary | ICD-10-CM | POA: Diagnosis not present

## 2020-07-15 DIAGNOSIS — M6281 Muscle weakness (generalized): Secondary | ICD-10-CM | POA: Diagnosis not present

## 2020-07-19 NOTE — Progress Notes (Signed)
Cardiology Office Note   Date:  07/20/2020   ID:  Heather Olsen, Heather Olsen Dec 25, 1930, MRN [id number]  PCP:  Deland Pretty, MD  Cardiologist:   Minus Breeding, MD   Chief Complaint  Patient presents with  . Shortness of Breath  . Chest Pain      History of Present Illness: Heather Olsen is a 86 y.o. female who presents for follow up of CAD. She had stent placement in 2008 and was followed by [facility].  She had an LAD stent placed in 2008 which was a 3.8 mm Vision. She had follow-up cath in 2009 with 30%  narrowing of the stent but otherwise no disease. Stress perfusion study in 2014 was negative. She does have some tricuspid regurgitation and some mild mitral regurgitation.  Lexiscan Myoview was negative in 2017.   Since I last saw her she saw her primary care.  Reviewed these records.  She was complaining of dizziness.  As noted previously she had had an event monitor ordered by her primary provider.  This demonstrated very short runs of paroxysmal atrial fibrillation.  She was referred back by her primary provider because of chest pain and presyncope.  There was a question of whether we need to decrease her metoprolol.  Since I last saw her she has continued to have palpitations.  She wore another monitor and I did review this today.  It was ordered by her primary provider.  She had some short runs of supraventricular tachycardia lasting 6-7 beats.  There was some sinus bradycardia.  There was no clear atrial fibrillation.  There were no sustained arrhythmias or high risk findings.  She reports that she is feeling the palpitations more frequently than she was.  She is feeling lightheaded with these.  She is short of breath walking a moderate distance on level ground.  She says she is moving apartments and she is had a hard time with a lot of the stressful activities that she is doing around this.  She gets some chest discomfort not associated with activity.  This is sporadic.  It  happens at rest.  May last for few minutes.  It is somewhat sharp.  She is not describing PND or orthopnea.  Of note we have tried her off of beta-blocker but she does not tolerate with palpitations.  She does not tolerate a higher dose with weakness and fatigue.    Past Medical History:  Diagnosis Date  . Aortic stenosis 04/12/2016  . Coronary artery disease   . Depression   . Diverticulosis   . Hearing loss 04/12/2016  . Hiatal hernia 04/12/2016  . HLD (hyperlipidemia) 04/12/2016  . Hyperglycemia 09/14/2016  . Hypertension   . Idiopathic scoliosis 04/12/2016  . Major depression 04/12/2016  . Mitral regurgitation   . Osteoporosis, senile 04/12/2016  . Pernicious anemia   . Raynaud's disease 04/12/2016  . Scoliosis   . Spinal stenosis of lumbar region 04/12/2016    Past Surgical History:  Procedure Laterality Date  . APPENDECTOMY    . BREAST SURGERY    . broken wrist Right 2007  . CARPAL TUNNEL RELEASE Left 04/04/2017   Procedure: LEFT CARPAL TUNNEL RELEASE;  Surgeon: Daryll Brod, MD;  Location: [place];  Service: Orthopedics;  Laterality: Left;  REG/FAB  . CATARACT EXTRACTION W/ INTRAOCULAR LENS  IMPLANT, BILATERAL Bilateral 2017  . COLONOSCOPY  2016  . FOOT SURGERY Right   . PERIPHERAL VASCULAR INTERVENTION  11/11/2019   Procedure:  PERIPHERAL VASCULAR INTERVENTION;  Surgeon: Waynetta Sandy, MD;  Location: [place] [facility];  Service: Cardiovascular;;  . TONSILLECTOMY    . VISCERAL ANGIOGRAPHY N/A 11/11/2019   Procedure: VISCERAL ANGIOGRAPHY;  Surgeon: Waynetta Sandy, MD;  Location: [place] [facility];  Service: Cardiovascular;  Laterality: N/A;     Current Outpatient Medications  Medication Sig Dispense Refill  . acetaminophen (TYLENOL) 650 MG CR tablet Take 650 mg by mouth every 8 (eight) hours as needed for pain.    . celecoxib (CELEBREX) 200 MG capsule TAKE 1 CAPSULE BY MOUTH EVERY DAY 30 capsule 0  . Cholecalciferol (VITAMIN D3) 2000  units capsule Take 2,000 Units by mouth daily.     . clonazePAM (KLONOPIN) 0.5 MG tablet 0.25 mg.     . cyanocobalamin (,VITAMIN B-12,) 1000 MCG/ML injection Inject 1 each into the skin every 30 (thirty) days.    [name] ELIQUIS 2.5 MG TABS tablet Take 2.5 mg by mouth 2 (two) times daily.    [name] escitalopram (LEXAPRO) 10 MG tablet Take 15 mg by mouth daily.    [name] ipratropium (ATROVENT) 0.06 % nasal spray Use 2 sprays in each nostril up to three times daily before meals for sinus drainage. 30 mL 3  . Lactobacillus-Inulin (Routt PO) Take by mouth. Take one daily for probiotic    . MULTIPLE VITAMINS-MINERALS PO Take 1 tablet by mouth daily.     . nitrofurantoin (MACRODANTIN) 100 MG capsule Take 100 mg by mouth daily.    . nitroGLYCERIN (NITROSTAT) 0.4 MG SL tablet Place under the tongue. Place one tablet under the tongue every 5 minutes as needed for chest pain. No more than 3    . pantoprazole (PROTONIX) 40 MG tablet Take 40 mg by mouth daily.    [name] rOPINIRole (REQUIP) 0.25 MG tablet Take 0.5 mg by mouth daily. Takes 1 1/2 tablet    . rosuvastatin (CRESTOR) 10 MG tablet Take 1 tablet (10 mg total) by mouth at bedtime. 30 tablet 11  . traMADol (ULTRAM) 50 MG tablet TAKE 1 TABLET BY MOUTH EVERY 4 HOURS (Patient taking differently: Take 50 mg by mouth every 4 (four) hours as needed (pain). ) 75 tablet 0  . pindolol (VISKEN) 5 MG tablet Take 0.5 tablets (2.5 mg total) by mouth 2 (two) times daily. 90 tablet 3   No current facility-administered medications for this visit.    Allergies:   Topiramate, Ace inhibitors, Codeine, Ibandronic acid, Prevacid [lansoprazole], Sulfa antibiotics, Verapamil, and Zolpidem    ROS:  Please see the history of present illness.   Otherwise, review of systems are positive for none.   All other systems are reviewed and negative.    PHYSICAL EXAM: VS:  BP 138/86 (BP Location: Left Arm, Patient Position: Sitting, Cuff Size: Normal)   Pulse (!) 54   Ht 5\' 4"   (1.626 m)   Wt 118 lb 3.2 oz (53.6 kg)   BMI 20.29 kg/m  , BMI Body mass index is 20.29 kg/m.  GENERAL:  Well appearing NECK:  No jugular venous distention, waveform within normal limits, carotid upstroke brisk and symmetric, no bruits, no thyromegaly LUNGS:  Clear to auscultation bilaterally CHEST:  Unremarkable HEART:  PMI not displaced or sustained,S1 and S2 within normal limits, no S3, no S4, no clicks, no rubs, 2 out of 6 apical systolic murmur radiating slightly at aortic outflow tract, no diastolic murmurs ABD:  Flat, positive bowel sounds normal in frequency in pitch, no bruits, no rebound,  no guarding, no midline pulsatile mass, no hepatomegaly, no splenomegaly EXT:  2 plus pulses throughout, no edema, no cyanosis no clubbing   EKG:  EKG is  ordered today. The ekg ordered today demonstrates sinus bradycardia, rate 54, left axis deviation, left anterior fascicular block, RSR prime V1 and V2, poor anterior R wave progression, first-degree AV block, no acute ST-T wave changes.     Recent Labs: 11/11/2019: BUN 27; Creatinine, Ser 1.00; Hemoglobin 14.3; Potassium 4.4; Sodium 136    Lipid Panel    Component Value Date/Time   CHOL 232 (H) 09/14/2016 1415   TRIG 168 (H) 09/14/2016 1415   HDL 99 09/14/2016 1415   CHOLHDL 2.3 09/14/2016 1415   VLDL 34 (H) 09/14/2016 1415   LDLCALC 99 09/14/2016 1415      Wt Readings from Last 3 Encounters:  07/20/20 118 lb 3.2 oz (53.6 kg)  07/01/20 114 lb 6.4 oz (51.9 kg)  06/02/20 115 lb (52.2 kg)      Other studies Reviewed: Additional studies/ records that were reviewed today include: Office records by primary provider Review of the above records demonstrates:  See elsewhere  ASSESSMENT AND PLAN:  CAD:   The patient has no new symptoms since her 2017 stress test.  She has some atypical chest pain.  I will have a low threshold for further perfusion study pending the results of the exam below.   ATRIAL FIB:  I do not see any more  documented atrial fibrillation but she is having brief runs of other atrial or supraventricular tachycardia.  She is already tolerating anticoagulation.  I am going to change her metoprolol and stop this and try pindolol 2.5 mg twice daily to see if she does better with fewer bradycardia arrhythmias while on this medication with control of her tachypalpitations.  If not I might have to try flecainide but would need to do the ischemia work-up beforehand.   AORTIC STENOSIS: She was short of breath walking around the office today.  Her oxygen saturations stayed above 90% but she was dyspneic.  I will check a BNP level and an echocardiogram to follow-up her shortness of breath.   HTN:The blood pressure is at target.  No change in therapy.   COVID EDUCATION:  She is vaccinated.     Current medicines are reviewed at length with the patient today.  The patient does not have  concerns regarding medicines.  The following changes have been made:  As above  Labs/ tests ordered today include:      Orders Placed This Encounter  Procedures  . Brain natriuretic peptide  . EKG 12-Lead  . ECHOCARDIOGRAM COMPLETE     Disposition:   FU with me after the above studies.   Signed, Minus Breeding, MD  07/20/2020 2:31 PM    [facility]

## 2020-07-20 ENCOUNTER — Ambulatory Visit (INDEPENDENT_AMBULATORY_CARE_PROVIDER_SITE_OTHER): Payer: Medicare Other | Admitting: Cardiology

## 2020-07-20 ENCOUNTER — Encounter: Payer: Self-pay | Admitting: Cardiology

## 2020-07-20 ENCOUNTER — Other Ambulatory Visit: Payer: Self-pay

## 2020-07-20 VITALS — BP 138/86 | HR 54 | Ht 64.0 in | Wt 118.2 lb

## 2020-07-20 DIAGNOSIS — I10 Essential (primary) hypertension: Secondary | ICD-10-CM | POA: Diagnosis not present

## 2020-07-20 DIAGNOSIS — I2510 Atherosclerotic heart disease of native coronary artery without angina pectoris: Secondary | ICD-10-CM | POA: Diagnosis not present

## 2020-07-20 DIAGNOSIS — R0602 Shortness of breath: Secondary | ICD-10-CM | POA: Diagnosis not present

## 2020-07-20 DIAGNOSIS — I480 Paroxysmal atrial fibrillation: Secondary | ICD-10-CM

## 2020-07-20 DIAGNOSIS — I350 Nonrheumatic aortic (valve) stenosis: Secondary | ICD-10-CM

## 2020-07-20 DIAGNOSIS — Z7189 Other specified counseling: Secondary | ICD-10-CM

## 2020-07-20 MED ORDER — PINDOLOL 5 MG PO TABS: 2.5000 mg | ORAL_TABLET | Freq: Two times a day (BID) | ORAL | 3 refills | Status: DC

## 2020-07-20 NOTE — Patient Instructions (Signed)
Medication Instructions:  Stop Metoprolol 25 mg daily Start Pindolol 2.5 mg twice a day  *If you need a refill on your cardiac medications before your next appointment, please call your pharmacy*   Lab Work: Your physician recommends that you return for lab work today ( BNP)  If you have labs (blood work) drawn today and your tests are completely normal, you will receive your results only by: [name] MyChart Message (if you have MyChart) OR . A paper copy in the mail If you have any lab test that is abnormal or we need to change your treatment, we will call you to review the results.   Testing/Procedures: Your physician has requested that you have an echocardiogram. Echocardiography is a painless test that uses sound waves to create images of your heart. It provides your doctor with information about the size and shape of your heart and how well your heart's chambers and valves are working. This procedure takes approximately one hour. There are no restrictions for this procedure. [place] [address]    Follow-Up: At [facility], you and your health needs are our priority.  As part of our continuing mission to provide you with exceptional heart care, we have created designated Provider Care Teams.  These Care Teams include your primary Cardiologist (physician) and Advanced Practice Providers (APPs -  Physician Assistants and Nurse Practitioners) who all work together to provide you with the care you need, when you need it.  We recommend signing up for the patient portal called "MyChart".  Sign up information is provided on this After Visit Summary.  MyChart is used to connect with patients for Virtual Visits (Telemedicine).  Patients are able to view lab/test results, encounter notes, upcoming appointments, etc.  Non-urgent messages can be sent to your provider as well.   To learn more about what you can do with MyChart, go to [email or web address].    Your next appointment:    6 week(s)  The format for your next appointment:   In Person  Provider:   Minus Breeding, MD

## 2020-07-21 DIAGNOSIS — R2681 Unsteadiness on feet: Secondary | ICD-10-CM | POA: Diagnosis not present

## 2020-07-21 DIAGNOSIS — M545 Low back pain: Secondary | ICD-10-CM | POA: Diagnosis not present

## 2020-07-21 DIAGNOSIS — D510 Vitamin B12 deficiency anemia due to intrinsic factor deficiency: Secondary | ICD-10-CM | POA: Diagnosis not present

## 2020-07-21 DIAGNOSIS — R29898 Other symptoms and signs involving the musculoskeletal system: Secondary | ICD-10-CM | POA: Diagnosis not present

## 2020-07-21 DIAGNOSIS — M6281 Muscle weakness (generalized): Secondary | ICD-10-CM | POA: Diagnosis not present

## 2020-07-21 DIAGNOSIS — I361 Nonrheumatic tricuspid (valve) insufficiency: Secondary | ICD-10-CM | POA: Diagnosis not present

## 2020-07-21 LAB — BRAIN NATRIURETIC PEPTIDE: BNP: 226.6 pg/mL — ABNORMAL HIGH (ref 0.0–100.0)

## 2020-07-23 ENCOUNTER — Emergency Department (HOSPITAL_COMMUNITY)
Admission: EM | Admit: 2020-07-23 | Discharge: 2020-07-23 | Disposition: A | Discharge status: Home / Self Care | Payer: Medicare Other | Attending: Emergency Medicine | Admitting: Emergency Medicine

## 2020-07-23 ENCOUNTER — Other Ambulatory Visit: Payer: Self-pay

## 2020-07-23 ENCOUNTER — Encounter (HOSPITAL_COMMUNITY): Payer: Self-pay | Admitting: *Deleted

## 2020-07-23 ENCOUNTER — Emergency Department (HOSPITAL_COMMUNITY): Payer: Medicare Other

## 2020-07-23 DIAGNOSIS — I517 Cardiomegaly: Secondary | ICD-10-CM | POA: Diagnosis not present

## 2020-07-23 DIAGNOSIS — Z79899 Other long term (current) drug therapy: Secondary | ICD-10-CM | POA: Diagnosis not present

## 2020-07-23 DIAGNOSIS — R61 Generalized hyperhidrosis: Secondary | ICD-10-CM | POA: Diagnosis not present

## 2020-07-23 DIAGNOSIS — I10 Essential (primary) hypertension: Secondary | ICD-10-CM | POA: Diagnosis not present

## 2020-07-23 DIAGNOSIS — J9811 Atelectasis: Secondary | ICD-10-CM | POA: Diagnosis not present

## 2020-07-23 DIAGNOSIS — R7989 Other specified abnormal findings of blood chemistry: Secondary | ICD-10-CM

## 2020-07-23 DIAGNOSIS — R079 Chest pain, unspecified: Secondary | ICD-10-CM | POA: Diagnosis not present

## 2020-07-23 DIAGNOSIS — E871 Hypo-osmolality and hyponatremia: Secondary | ICD-10-CM

## 2020-07-23 DIAGNOSIS — R0600 Dyspnea, unspecified: Secondary | ICD-10-CM | POA: Insufficient documentation

## 2020-07-23 DIAGNOSIS — R0602 Shortness of breath: Secondary | ICD-10-CM | POA: Diagnosis not present

## 2020-07-23 DIAGNOSIS — I2510 Atherosclerotic heart disease of native coronary artery without angina pectoris: Secondary | ICD-10-CM | POA: Diagnosis not present

## 2020-07-23 DIAGNOSIS — R0789 Other chest pain: Secondary | ICD-10-CM | POA: Diagnosis not present

## 2020-07-23 DIAGNOSIS — D539 Nutritional anemia, unspecified: Secondary | ICD-10-CM | POA: Insufficient documentation

## 2020-07-23 DIAGNOSIS — Z7982 Long term (current) use of aspirin: Secondary | ICD-10-CM | POA: Insufficient documentation

## 2020-07-23 DIAGNOSIS — R21 Rash and other nonspecific skin eruption: Secondary | ICD-10-CM | POA: Diagnosis not present

## 2020-07-23 DIAGNOSIS — R069 Unspecified abnormalities of breathing: Secondary | ICD-10-CM | POA: Diagnosis not present

## 2020-07-23 DIAGNOSIS — J439 Emphysema, unspecified: Secondary | ICD-10-CM | POA: Diagnosis not present

## 2020-07-23 DIAGNOSIS — D531 Other megaloblastic anemias, not elsewhere classified: Secondary | ICD-10-CM | POA: Diagnosis not present

## 2020-07-23 DIAGNOSIS — D6832 Hemorrhagic disorder due to extrinsic circulating anticoagulants: Secondary | ICD-10-CM | POA: Insufficient documentation

## 2020-07-23 DIAGNOSIS — Z7901 Long term (current) use of anticoagulants: Secondary | ICD-10-CM | POA: Diagnosis not present

## 2020-07-23 LAB — CBC
HCT: 33.3 % — ABNORMAL LOW (ref 36.0–46.0)
Hemoglobin: 11.1 g/dL — ABNORMAL LOW (ref 12.0–15.0)
MCH: 37.1 pg — ABNORMAL HIGH (ref 26.0–34.0)
MCHC: 33.3 g/dL (ref 30.0–36.0)
MCV: 111.4 fL — ABNORMAL HIGH (ref 80.0–100.0)
Platelets: 292 10*3/uL (ref 150–400)
RBC: 2.99 MIL/uL — ABNORMAL LOW (ref 3.87–5.11)
RDW: 11.7 % (ref 11.5–15.5)
WBC: 6.7 10*3/uL (ref 4.0–10.5)
nRBC: 0 % (ref 0.0–0.2)

## 2020-07-23 LAB — BASIC METABOLIC PANEL
Anion gap: 9 (ref 5–15)
BUN: 24 mg/dL — ABNORMAL HIGH (ref 8–23)
CO2: 25 mmol/L (ref 22–32)
Calcium: 9.6 mg/dL (ref 8.9–10.3)
Chloride: 99 mmol/L (ref 98–111)
Creatinine, Ser: 0.7 mg/dL (ref 0.44–1.00)
GFR calc Af Amer: >60 mL/min (ref >60)
GFR calc non Af Amer: >60 mL/min (ref >60)
Glucose, Bld: 109 mg/dL — ABNORMAL HIGH (ref 70–99)
Potassium: 5.1 mmol/L (ref 3.5–5.1)
Sodium: 133 mmol/L — ABNORMAL LOW (ref 135–145)

## 2020-07-23 LAB — BRAIN NATRIURETIC PEPTIDE: B Natriuretic Peptide: 223.2 pg/mL — ABNORMAL HIGH (ref 0.0–100.0)

## 2020-07-23 LAB — TROPONIN I (HIGH SENSITIVITY)
Troponin I (High Sensitivity): 6 ng/L (ref <18)
Troponin I (High Sensitivity): 6 ng/L (ref <18)

## 2020-07-23 MED ORDER — NITROGLYCERIN 0.4 MG SL SUBL: 0.4000 mg | SUBLINGUAL_TABLET | SUBLINGUAL | 0 refills | Status: DC | PRN

## 2020-07-23 MED ORDER — FUROSEMIDE 10 MG/ML IJ SOLN
20.0000 mg | Freq: Once | INTRAMUSCULAR | Status: AC
  Administered 2020-07-23: 20 mg via INTRAVENOUS
  Filled 2020-07-23: qty 4

## 2020-07-23 MED ORDER — FUROSEMIDE 20 MG PO TABS: 20.0000 mg | ORAL_TABLET | Freq: Every day | ORAL | 0 refills | Status: DC

## 2020-07-23 MED ORDER — NITROGLYCERIN 0.4 MG SL SUBL
0.4000 mg | SUBLINGUAL_TABLET | Freq: Once | SUBLINGUAL | Status: AC
  Administered 2020-07-23: 0.4 mg via SUBLINGUAL

## 2020-07-23 MED ORDER — ASPIRIN 81 MG PO CHEW
324.0000 mg | CHEWABLE_TABLET | Freq: Once | ORAL | Status: AC
  Administered 2020-07-23: 324 mg via ORAL
  Filled 2020-07-23: qty 4

## 2020-07-23 MED ORDER — ALUM & MAG HYDROXIDE-SIMETH 200-200-20 MG/5ML PO SUSP
30.0000 mL | Freq: Once | ORAL | Status: AC
  Administered 2020-07-23: 30 mL via ORAL
  Filled 2020-07-23: qty 30

## 2020-07-23 MED ORDER — LIDOCAINE VISCOUS HCL 2 % MT SOLN
15.0000 mL | Freq: Once | OROMUCOSAL | Status: AC
  Administered 2020-07-23: 15 mL via ORAL
  Filled 2020-07-23: qty 15

## 2020-07-23 MED ORDER — NITROGLYCERIN 0.4 MG SL SUBL
0.4000 mg | SUBLINGUAL_TABLET | SUBLINGUAL | Status: DC | PRN
  Administered 2020-07-23: 0.4 mg via SUBLINGUAL
  Filled 2020-07-23: qty 1

## 2020-07-23 NOTE — ED Triage Notes (Signed)
Pt arrives via [facility] from home ([facility] per EMS report with c/o chest pain/ Shortness of breath for about a month,  for worse 2 weeks ago. Taken off metoprolol, put on pindolol yesterday by cards. Sharp pain and pressure in her chest tonight. Pressure remains. Took 2 nitro without relief. Sitting up makes pressure in her chest better. Rhonchi on the left side. Denies fevers. 154/70, hr 40-50's, hx of afib. Showing atrial arrhythmia on the monitor. Saturations 98-100%. Hx of CAD and stents.

## 2020-07-23 NOTE — ED Triage Notes (Signed)
Pt states she has been having more shortness of breath recently, has a cough a night, not during the day. No fevers. Some swelling in her ankles. Tonight with onset of "sharp" chest pain just to the left of the sternum, followed by a "pressure" that continues at this time.

## 2020-07-23 NOTE — Discharge Instructions (Signed)
Go for your echocardiogram as scheduled.  Return if you are having any problems.

## 2020-07-23 NOTE — ED Provider Notes (Signed)
[facility] Provider Note   CSN: [id number] Arrival date & time: 07/23/20  0051   History Chief Complaint  Patient presents with  . Chest Pain    Heather Olsen is a 86 y.o. female.  The history is provided by the patient.  Chest Pain She has history of hypertension, hyperlipidemia, coronary artery disease status post stent placement, paroxysmal atrial fibrillation anticoagulated on apixaban and comes in complaining of chest pain and dyspnea.  While doing laundry, she noted a sharp pain in her mid chest without radiation with associated dyspnea.  Sharp pain subsided quickly, and she has had a pressure sensation in her chest since then.  There was some momentary diaphoresis but no nausea or vomiting.  She has noted over the last several weeks, she has had dyspnea with even modest exertion but has not had any orthopnea or paroxysmal nocturnal dyspnea.  She had seen her cardiologist last week and has an echocardiogram scheduled.  She states that the pressure sensation in her chest is still there.  Past Medical History:  Diagnosis Date  . Aortic stenosis 04/12/2016  . Coronary artery disease   . Depression   . Diverticulosis   . Hearing loss 04/12/2016  . Hiatal hernia 04/12/2016  . HLD (hyperlipidemia) 04/12/2016  . Hyperglycemia 09/14/2016  . Hypertension   . Idiopathic scoliosis 04/12/2016  . Major depression 04/12/2016  . Mitral regurgitation   . Osteoporosis, senile 04/12/2016  . Pernicious anemia   . Raynaud's disease 04/12/2016  . Scoliosis   . Spinal stenosis of lumbar region 04/12/2016    Patient Active Problem List   Diagnosis Date Noted  . PAF (paroxysmal atrial fibrillation) ([place]) 05/31/2020  . Educated about COVID-19 virus infection 02/13/2020  . Dyspnea 12/29/2019  . Coronary artery disease involving native coronary artery of native heart without angina pectoris 03/01/2019  . Leg swelling 03/01/2019  . Dyslipidemia 03/01/2019  . Urgency  of micturition 10/11/2016  . Hyperglycemia 09/14/2016  . Tricuspid insufficiency 06/27/2016  . Arteriosclerosis of coronary artery 04/12/2016  . HLD (hyperlipidemia) 04/12/2016  . Major depression 04/12/2016  . Idiopathic scoliosis 04/12/2016  . Spinal stenosis of lumbar region 04/12/2016  . Aortic stenosis 04/12/2016  . Mitral regurgitation 04/12/2016  . Pernicious anemia 04/12/2016  . Hearing loss 04/12/2016  . Glaucoma 04/12/2016  . Palpitations 04/12/2016  . Hiatal hernia 04/12/2016  . Back pain 04/12/2016  . Osteoporosis, senile 04/12/2016  . Anemia 04/12/2016  . Raynaud's disease 04/12/2016  . Balance disorder 04/12/2016  . Beat, premature ventricular 01/08/2016  . APC (atrial premature contractions) 06/16/2014  . Fatigue 06/17/2013  . BP (high blood pressure) 06/17/2013    Past Surgical History:  Procedure Laterality Date  . APPENDECTOMY    . BREAST SURGERY    . broken wrist Right 2007  . CARPAL TUNNEL RELEASE Left 04/04/2017   Procedure: LEFT CARPAL TUNNEL RELEASE;  Surgeon: Daryll Brod, MD;  Location: [facility];  Service: Orthopedics;  Laterality: Left;  REG/FAB  . CATARACT EXTRACTION W/ INTRAOCULAR LENS  IMPLANT, BILATERAL Bilateral 2017  . COLONOSCOPY  2016  . FOOT SURGERY Right   . PERIPHERAL VASCULAR INTERVENTION  11/11/2019   Procedure: PERIPHERAL VASCULAR INTERVENTION;  Surgeon: Waynetta Sandy, MD;  Location: [facility];  Service: Cardiovascular;;  . TONSILLECTOMY    . VISCERAL ANGIOGRAPHY N/A 11/11/2019   Procedure: VISCERAL ANGIOGRAPHY;  Surgeon: Waynetta Sandy, MD;  Location: [facility];  Service: Cardiovascular;  Laterality: N/A;  OB History   No obstetric history on file.     Family History  Problem Relation Age of Onset  . Heart disease Mother        Died 92, MR, no CAD  . Heart disease Father        CHF, no CAD..   . HIV Daughter 66       from bone tissue transplant    Social History    Tobacco Use  . Smoking status: Never Smoker  . Smokeless tobacco: Never Used  Vaping Use  . Vaping Use: Never used  Substance Use Topics  . Alcohol use: Yes    Comment: wine 3-4 times a week  . Drug use: No    Home Medications Prior to Admission medications   Medication Sig Start Date End Date Taking? Authorizing Provider  acetaminophen (TYLENOL) 650 MG CR tablet Take 650 mg by mouth every 8 (eight) hours as needed for pain.    [provider]  celecoxib (CELEBREX) 200 MG capsule TAKE 1 CAPSULE BY MOUTH EVERY DAY 12/16/16   Estill Dooms, MD  Cholecalciferol (VITAMIN D3) 2000 units capsule Take 2,000 Units by mouth daily.     [provider]  clonazePAM (KLONOPIN) 0.5 MG tablet 0.25 mg.     [provider]  cyanocobalamin (,VITAMIN B-12,) 1000 MCG/ML injection Inject 1 each into the skin every 30 (thirty) days.    [provider]  ELIQUIS 2.5 MG TABS tablet Take 2.5 mg by mouth 2 (two) times daily. 02/03/20   [provider]  escitalopram (LEXAPRO) 10 MG tablet Take 15 mg by mouth daily. 12/26/19   [provider]  ipratropium (ATROVENT) 0.06 % nasal spray Use 2 sprays in each nostril up to three times daily before meals for sinus drainage. 09/30/16   Estill Dooms, MD  Lactobacillus-Inulin ([place]) Take by mouth. Take one daily for probiotic    [provider]  MULTIPLE VITAMINS-MINERALS PO Take 1 tablet by mouth daily.     [provider]  nitrofurantoin (MACRODANTIN) 100 MG capsule Take 100 mg by mouth daily.    [provider]  nitroGLYCERIN (NITROSTAT) 0.4 MG SL tablet Place under the tongue. Place one tablet under the tongue every 5 minutes as needed for chest pain. No more than 3    [provider]  pantoprazole (PROTONIX) 40 MG tablet Take 40 mg by mouth daily. 12/18/19   [provider]  pindolol (VISKEN) 5 MG tablet Take 0.5 tablets (2.5 mg total) by mouth  2 (two) times daily. 07/20/20   Minus Breeding, MD  rOPINIRole (REQUIP) 0.25 MG tablet Take 0.5 mg by mouth daily. Takes 1 1/2 tablet    [provider]  rosuvastatin (CRESTOR) 10 MG tablet Take 1 tablet (10 mg total) by mouth at bedtime. 11/11/19 11/10/20  Waynetta Sandy, MD  traMADol (ULTRAM) 50 MG tablet TAKE 1 TABLET BY MOUTH EVERY 4 HOURS Patient taking differently: Take 50 mg by mouth every 4 (four) hours as needed (pain).  11/14/16   Reed, Tiffany L, DO    Allergies    Topiramate, Ace inhibitors, Codeine, Ibandronic acid, Prevacid [lansoprazole], Sulfa antibiotics, Verapamil, and Zolpidem  Review of Systems   Review of Systems  Cardiovascular: Positive for chest pain.  All other systems reviewed and are negative.   Physical Exam Updated Vital Signs BP (!) 145/77   Pulse (!) 59   Temp 97.9 F (36.6 C) (Oral)   Resp  18   Ht 5\' 4"  (1.626 m)   Wt 53.5 kg   SpO2 98%   BMI 20.25 kg/m   Physical Exam Vitals and nursing note reviewed.   86 year old female, resting comfortably and in no acute distress. Vital signs are significant for mildly elevated blood pressure. Oxygen saturation is 98%, which is normal. Head is normocephalic and atraumatic. PERRLA, EOMI. Oropharynx is clear. Neck is nontender and supple without adenopathy or JVD. Back is nontender and there is no CVA tenderness. Lungs have bibasilar rales which are more prominent on the left.  There are no wheezes or rhonchi. Chest is nontender. Heart has regular rate and rhythm with 3-8/2 holosystolic murmur best heard at the upper left sternal border. Abdomen is soft, flat, nontender without masses or hepatosplenomegaly and peristalsis is normoactive. Extremities have no cyanosis or edema, full range of motion is present. Skin is warm and dry without rash. Neurologic: Mental status is normal, cranial nerves are intact, there are no motor or sensory deficits.   ED Results / Procedures / Treatments     Labs (all labs ordered are listed, but only abnormal results are displayed) Labs Reviewed  BASIC METABOLIC PANEL - Abnormal; Notable for the following components:      Result Value   Sodium 133 (*)    Glucose, Bld 109 (*)    BUN 24 (*)    All other components within normal limits  CBC - Abnormal; Notable for the following components:   RBC 2.99 (*)    Hemoglobin 11.1 (*)    HCT 33.3 (*)    MCV 111.4 (*)    MCH 37.1 (*)    All other components within normal limits  BRAIN NATRIURETIC PEPTIDE - Abnormal; Notable for the following components:   B Natriuretic Peptide 223.2 (*)    All other components within normal limits  TROPONIN I (HIGH SENSITIVITY)  TROPONIN I (HIGH SENSITIVITY)    EKG EKG Interpretation  Date/Time:  Thursday July 23 2020 01:01:28 EDT Ventricular Rate:  54 PR Interval:    QRS Duration: 82 QT Interval:  475 QTC Calculation: 451 R Axis:   -50 Text Interpretation: Sinus or ectopic atrial rhythm Atrial premature complex Prolonged PR interval RSR' in V1 or V2, right VCD or RVH Probable left ventricular hypertrophy Inferior infarct, old Baseline wander in lead(s) V1 No old tracing to compare Confirmed by Delora Fuel ([id number]) on 07/23/2020 1:15:57 AM   Radiology DG Chest 2 View  Result Date: 07/23/2020 CLINICAL DATA:  86 year old female with shortness of breath. EXAM: CHEST - 2 VIEW COMPARISON:  Chest radiograph dated 01/08/2020. FINDINGS: Background of mild emphysema and chronic bronchitic changes. Minimal bibasilar densities, likely atelectasis. No focal consolidation, pleural effusion or pneumothorax. Mild cardiomegaly. Atherosclerotic calcification of the aorta. Osteopenia with degenerative changes of the spine and scoliosis. No acute osseous pathology. IMPRESSION: No active cardiopulmonary disease. Electronically Signed   By: Anner Crete M.D.   On: 07/23/2020 01:45    Procedures Procedures   Medications Ordered in ED Medications  nitroGLYCERIN  (NITROSTAT) SL tablet 0.4 mg (0.4 mg Sublingual Given 07/23/20 0310)  aspirin chewable tablet 324 mg (324 mg Oral Given 07/23/20 0306)  furosemide (LASIX) injection 20 mg (20 mg Intravenous Given 07/23/20 0308)  nitroGLYCERIN (NITROSTAT) SL tablet 0.4 mg (0.4 mg Sublingual Given 07/23/20 0452)  alum & mag hydroxide-simeth (MAALOX/MYLANTA) 200-200-20 MG/5ML suspension 30 mL (30 mLs Oral Given 07/23/20 0536)    And  lidocaine (XYLOCAINE) 2 % viscous mouth solution  15 mL (15 mLs Oral Given 07/23/20 0536)    ED Course  I have reviewed the triage vital signs and the nursing notes.  Pertinent labs & imaging results that were available during my care of the patient were reviewed by me and considered in my medical decision making (see chart for details).  MDM Rules/Calculators/A&P Chest discomfort worrisome for angina in patient with history of coronary artery disease.  ECG shows no acute changes and initial troponin is normal.  Old records are reviewed confirming recent cardiology visit at which time BNP was drawn which was elevated at 226.6.  I suspect some of her dyspnea may be mild heart failure.  Chest x-ray shows cardiomegaly without obvious pulmonary vascular congestion.  He has no peripheral edema.  She will be given aspirin and nitroglycerin and also dose of furosemide.  We will recheck BNP today.  Other labs show macrocytic anemia which is new compared with 2017.  Mild hyponatremia is present and not felt to be clinically significant.  We will also check delta troponin.  Repeat troponin is unchanged.  There was considerable relief of discomfort with nitroglycerin.  However, there was no additional relief with subsequent nitroglycerin.  She stated that it seemed that she felt better when she was sitting up.  She was given a trial of GI cocktail to see if symptoms might be related to GERD, but there was little change with GI cocktail.  With lack of ECG changes and 2 - troponins, she was felt to be safe  for discharge.  She is given a prescription for nitroglycerin.  She is also given a prescription for furosemide.  She is to follow-up with her cardiologist.  Return precautions discussed.  Final Clinical Impression(s) / ED Diagnoses Final diagnoses:  Chest discomfort  Shortness of breath  Elevated brain natriuretic peptide (BNP) level  Macrocytic anemia  Hyponatremia  Chronic anticoagulation    Rx / DC Orders ED Discharge Orders         Ordered    furosemide (LASIX) 20 MG tablet  Daily        07/23/20 0606    nitroGLYCERIN (NITROSTAT) 0.4 MG SL tablet  Every 5 min PRN        07/23/20 5790           Delora Fuel, MD 38/33/38 [phone]

## 2020-07-28 ENCOUNTER — Other Ambulatory Visit (HOSPITAL_COMMUNITY): Payer: Medicare Other

## 2020-08-06 DIAGNOSIS — I361 Nonrheumatic tricuspid (valve) insufficiency: Secondary | ICD-10-CM | POA: Diagnosis not present

## 2020-08-06 DIAGNOSIS — M545 Low back pain: Secondary | ICD-10-CM | POA: Diagnosis not present

## 2020-08-06 DIAGNOSIS — D510 Vitamin B12 deficiency anemia due to intrinsic factor deficiency: Secondary | ICD-10-CM | POA: Diagnosis not present

## 2020-08-06 DIAGNOSIS — M6281 Muscle weakness (generalized): Secondary | ICD-10-CM | POA: Diagnosis not present

## 2020-08-06 DIAGNOSIS — R29898 Other symptoms and signs involving the musculoskeletal system: Secondary | ICD-10-CM | POA: Diagnosis not present

## 2020-08-06 DIAGNOSIS — I340 Nonrheumatic mitral (valve) insufficiency: Secondary | ICD-10-CM | POA: Diagnosis not present

## 2020-08-06 DIAGNOSIS — Z955 Presence of coronary angioplasty implant and graft: Secondary | ICD-10-CM | POA: Diagnosis not present

## 2020-08-06 DIAGNOSIS — R2681 Unsteadiness on feet: Secondary | ICD-10-CM | POA: Diagnosis not present

## 2020-08-06 DIAGNOSIS — K5791 Diverticulosis of intestine, part unspecified, without perforation or abscess with bleeding: Secondary | ICD-10-CM | POA: Diagnosis not present

## 2020-08-11 DIAGNOSIS — D510 Vitamin B12 deficiency anemia due to intrinsic factor deficiency: Secondary | ICD-10-CM | POA: Diagnosis not present

## 2020-08-11 DIAGNOSIS — M6281 Muscle weakness (generalized): Secondary | ICD-10-CM | POA: Diagnosis not present

## 2020-08-11 DIAGNOSIS — I340 Nonrheumatic mitral (valve) insufficiency: Secondary | ICD-10-CM | POA: Diagnosis not present

## 2020-08-11 DIAGNOSIS — R29898 Other symptoms and signs involving the musculoskeletal system: Secondary | ICD-10-CM | POA: Diagnosis not present

## 2020-08-11 DIAGNOSIS — R2681 Unsteadiness on feet: Secondary | ICD-10-CM | POA: Diagnosis not present

## 2020-08-11 DIAGNOSIS — M545 Low back pain: Secondary | ICD-10-CM | POA: Diagnosis not present

## 2020-08-12 DIAGNOSIS — E538 Deficiency of other specified B group vitamins: Secondary | ICD-10-CM | POA: Diagnosis not present

## 2020-08-12 DIAGNOSIS — E118 Type 2 diabetes mellitus with unspecified complications: Secondary | ICD-10-CM | POA: Diagnosis not present

## 2020-08-12 DIAGNOSIS — Z7901 Long term (current) use of anticoagulants: Secondary | ICD-10-CM | POA: Diagnosis not present

## 2020-08-12 DIAGNOSIS — E7800 Pure hypercholesterolemia, unspecified: Secondary | ICD-10-CM | POA: Diagnosis not present

## 2020-08-12 DIAGNOSIS — I10 Essential (primary) hypertension: Secondary | ICD-10-CM | POA: Diagnosis not present

## 2020-08-12 DIAGNOSIS — E119 Type 2 diabetes mellitus without complications: Secondary | ICD-10-CM | POA: Diagnosis not present

## 2020-08-12 DIAGNOSIS — M4120 Other idiopathic scoliosis, site unspecified: Secondary | ICD-10-CM | POA: Diagnosis not present

## 2020-08-12 DIAGNOSIS — E782 Mixed hyperlipidemia: Secondary | ICD-10-CM | POA: Diagnosis not present

## 2020-08-12 DIAGNOSIS — M545 Low back pain: Secondary | ICD-10-CM | POA: Diagnosis not present

## 2020-08-13 ENCOUNTER — Other Ambulatory Visit: Payer: Self-pay

## 2020-08-13 ENCOUNTER — Ambulatory Visit (HOSPITAL_COMMUNITY): Payer: Medicare Other | Attending: Cardiology

## 2020-08-13 DIAGNOSIS — R0602 Shortness of breath: Secondary | ICD-10-CM | POA: Diagnosis not present

## 2020-08-13 LAB — ECHOCARDIOGRAM COMPLETE
AR max vel: 2.81 cm2
AV Area VTI: 2.87 cm2
AV Area mean vel: 2.73 cm2
AV Mean grad: 2 mmHg
AV Peak grad: 3.8 mmHg
Ao pk vel: 0.98 m/s
Area-P 1/2: 5.38 cm2
S' Lateral: 2.1 cm

## 2020-08-18 DIAGNOSIS — M545 Low back pain: Secondary | ICD-10-CM | POA: Diagnosis not present

## 2020-08-18 DIAGNOSIS — R29898 Other symptoms and signs involving the musculoskeletal system: Secondary | ICD-10-CM | POA: Diagnosis not present

## 2020-08-18 DIAGNOSIS — R2681 Unsteadiness on feet: Secondary | ICD-10-CM | POA: Diagnosis not present

## 2020-08-18 DIAGNOSIS — M6281 Muscle weakness (generalized): Secondary | ICD-10-CM | POA: Diagnosis not present

## 2020-08-18 DIAGNOSIS — I340 Nonrheumatic mitral (valve) insufficiency: Secondary | ICD-10-CM | POA: Diagnosis not present

## 2020-08-18 DIAGNOSIS — D510 Vitamin B12 deficiency anemia due to intrinsic factor deficiency: Secondary | ICD-10-CM | POA: Diagnosis not present

## 2020-08-19 DIAGNOSIS — K219 Gastro-esophageal reflux disease without esophagitis: Secondary | ICD-10-CM | POA: Diagnosis not present

## 2020-08-19 DIAGNOSIS — I509 Heart failure, unspecified: Secondary | ICD-10-CM | POA: Diagnosis not present

## 2020-08-19 DIAGNOSIS — E782 Mixed hyperlipidemia: Secondary | ICD-10-CM | POA: Diagnosis not present

## 2020-08-19 DIAGNOSIS — I10 Essential (primary) hypertension: Secondary | ICD-10-CM | POA: Diagnosis not present

## 2020-08-19 DIAGNOSIS — M810 Age-related osteoporosis without current pathological fracture: Secondary | ICD-10-CM | POA: Diagnosis not present

## 2020-08-19 DIAGNOSIS — I2510 Atherosclerotic heart disease of native coronary artery without angina pectoris: Secondary | ICD-10-CM | POA: Diagnosis not present

## 2020-08-21 ENCOUNTER — Telehealth: Payer: Self-pay | Admitting: Cardiology

## 2020-08-21 DIAGNOSIS — I340 Nonrheumatic mitral (valve) insufficiency: Secondary | ICD-10-CM | POA: Diagnosis not present

## 2020-08-21 DIAGNOSIS — R29898 Other symptoms and signs involving the musculoskeletal system: Secondary | ICD-10-CM | POA: Diagnosis not present

## 2020-08-21 DIAGNOSIS — M545 Low back pain: Secondary | ICD-10-CM | POA: Diagnosis not present

## 2020-08-21 DIAGNOSIS — R2681 Unsteadiness on feet: Secondary | ICD-10-CM | POA: Diagnosis not present

## 2020-08-21 DIAGNOSIS — M6281 Muscle weakness (generalized): Secondary | ICD-10-CM | POA: Diagnosis not present

## 2020-08-21 DIAGNOSIS — D510 Vitamin B12 deficiency anemia due to intrinsic factor deficiency: Secondary | ICD-10-CM | POA: Diagnosis not present

## 2020-08-21 NOTE — Telephone Encounter (Signed)
Returned the call to the patient. She was calling for her ECHO results:  There was no evidence of aortic stenosis. NL LV function. There is some pulmonary HTN but I do not suspect a primary etiology and do not think this is contributing significantly to her symptoms. No change in therapy. Call Heather Olsen with the results and send results to Deland Pretty, MD

## 2020-08-21 NOTE — Telephone Encounter (Signed)
Patient is calling back to speak with nurse. Said someone from Dr. Rosezella [place] office told her to call back today to speak with someone.

## 2020-08-25 DIAGNOSIS — R2681 Unsteadiness on feet: Secondary | ICD-10-CM | POA: Diagnosis not present

## 2020-08-25 DIAGNOSIS — I340 Nonrheumatic mitral (valve) insufficiency: Secondary | ICD-10-CM | POA: Diagnosis not present

## 2020-08-25 DIAGNOSIS — M545 Low back pain: Secondary | ICD-10-CM | POA: Diagnosis not present

## 2020-08-25 DIAGNOSIS — M6281 Muscle weakness (generalized): Secondary | ICD-10-CM | POA: Diagnosis not present

## 2020-08-25 DIAGNOSIS — D510 Vitamin B12 deficiency anemia due to intrinsic factor deficiency: Secondary | ICD-10-CM | POA: Diagnosis not present

## 2020-08-25 DIAGNOSIS — R29898 Other symptoms and signs involving the musculoskeletal system: Secondary | ICD-10-CM | POA: Diagnosis not present

## 2020-08-31 DIAGNOSIS — D510 Vitamin B12 deficiency anemia due to intrinsic factor deficiency: Secondary | ICD-10-CM | POA: Diagnosis not present

## 2020-08-31 DIAGNOSIS — M5451 Vertebrogenic low back pain: Secondary | ICD-10-CM | POA: Diagnosis not present

## 2020-08-31 DIAGNOSIS — M419 Scoliosis, unspecified: Secondary | ICD-10-CM | POA: Diagnosis not present

## 2020-08-31 DIAGNOSIS — R29898 Other symptoms and signs involving the musculoskeletal system: Secondary | ICD-10-CM | POA: Diagnosis not present

## 2020-08-31 DIAGNOSIS — I340 Nonrheumatic mitral (valve) insufficiency: Secondary | ICD-10-CM | POA: Diagnosis not present

## 2020-08-31 DIAGNOSIS — Z955 Presence of coronary angioplasty implant and graft: Secondary | ICD-10-CM | POA: Diagnosis not present

## 2020-08-31 DIAGNOSIS — K5791 Diverticulosis of intestine, part unspecified, without perforation or abscess with bleeding: Secondary | ICD-10-CM | POA: Diagnosis not present

## 2020-08-31 DIAGNOSIS — I361 Nonrheumatic tricuspid (valve) insufficiency: Secondary | ICD-10-CM | POA: Diagnosis not present

## 2020-08-31 DIAGNOSIS — M6281 Muscle weakness (generalized): Secondary | ICD-10-CM | POA: Diagnosis not present

## 2020-08-31 DIAGNOSIS — R2681 Unsteadiness on feet: Secondary | ICD-10-CM | POA: Diagnosis not present

## 2020-09-01 DIAGNOSIS — Z23 Encounter for immunization: Secondary | ICD-10-CM | POA: Diagnosis not present

## 2020-09-03 ENCOUNTER — Ambulatory Visit: Payer: Medicare Other | Admitting: Cardiology

## 2020-09-10 DIAGNOSIS — E538 Deficiency of other specified B group vitamins: Secondary | ICD-10-CM | POA: Diagnosis not present

## 2020-09-16 DIAGNOSIS — Z961 Presence of intraocular lens: Secondary | ICD-10-CM | POA: Diagnosis not present

## 2020-09-16 DIAGNOSIS — H5213 Myopia, bilateral: Secondary | ICD-10-CM | POA: Diagnosis not present

## 2020-09-16 DIAGNOSIS — H04121 Dry eye syndrome of right lacrimal gland: Secondary | ICD-10-CM | POA: Diagnosis not present

## 2020-09-16 DIAGNOSIS — H52203 Unspecified astigmatism, bilateral: Secondary | ICD-10-CM | POA: Diagnosis not present

## 2020-09-25 DIAGNOSIS — M79672 Pain in left foot: Secondary | ICD-10-CM | POA: Diagnosis not present

## 2020-09-25 DIAGNOSIS — B9689 Other specified bacterial agents as the cause of diseases classified elsewhere: Secondary | ICD-10-CM | POA: Diagnosis not present

## 2020-09-25 DIAGNOSIS — L600 Ingrowing nail: Secondary | ICD-10-CM | POA: Diagnosis not present

## 2020-09-25 DIAGNOSIS — M25572 Pain in left ankle and joints of left foot: Secondary | ICD-10-CM | POA: Diagnosis not present

## 2020-09-28 ENCOUNTER — Ambulatory Visit: Payer: Medicare Other | Admitting: Cardiology

## 2020-09-30 NOTE — Progress Notes (Signed)
Cardiology Office Note   Date:  10/01/2020   ID:  Heather Olsen, Heather Olsen December 03, 1930, MRN [id number]  PCP:  Deland Pretty, MD  Cardiologist:   Minus Breeding, MD   Chief Complaint  Patient presents with  . Palpitations      History of Present Illness: Heather Olsen is a 86 y.o. female who presents for follow up of CAD. She had stent placement in 2008 and was followed by [facility].  She had an LAD stent placed in 2008 which was a 3.8 mm Vision. She had follow-up cath in 2009 with 30%  narrowing of the stent but otherwise no disease. Stress perfusion study in 2014 was negative. She does have some tricuspid regurgitation and some mild mitral regurgitation.  Lexiscan Myoview was negative in 2017.   She had dizziness and was found to have short runs of atrial fib.   She had tachy brady palpitations and so I switcher her beta blocker to pindolol.   She had SOB and an echo demonstrated NL LV function with moderately elevated pulmonary pressures.   BNP was mildly elevated.    Since that visit she was in the emergency room and I reviewed these records.  This was in August.  She had some chest discomfort there was some question of her having some vascular congestion.  Chest x-ray did not suggest this.  However, she was treated with some IV Lasix.  She was given sublingual nitroglycerin and he states she had improvement in her symptoms although she was also treated with a GI cocktail.  There were no enzyme elevations and her EKG was unremarkable.  Since that time she has been taking 20 mg of Lasix daily.  I did review blood work and she had normal blood work.   Her creatinine seems to be tolerating the low-dose Lasix.  She did have improvement in her palpitations with pindolol.  She has had no new shortness of breath, PND or orthopnea.  She had no chest pressure, neck or arm discomfort.  She has had no weight gain or edema.  Past Medical History:  Diagnosis Date  . Aortic stenosis 04/12/2016  .  Coronary artery disease   . Depression   . Diverticulosis   . Hearing loss 04/12/2016  . Hiatal hernia 04/12/2016  . HLD (hyperlipidemia) 04/12/2016  . Hyperglycemia 09/14/2016  . Hypertension   . Idiopathic scoliosis 04/12/2016  . Major depression 04/12/2016  . Mitral regurgitation   . Osteoporosis, senile 04/12/2016  . Pernicious anemia   . Raynaud's disease 04/12/2016  . Scoliosis   . Spinal stenosis of lumbar region 04/12/2016    Past Surgical History:  Procedure Laterality Date  . APPENDECTOMY    . BREAST SURGERY    . broken wrist Right 2007  . CARPAL TUNNEL RELEASE Left 04/04/2017   Procedure: LEFT CARPAL TUNNEL RELEASE;  Surgeon: Daryll Brod, MD;  Location: [facility];  Service: Orthopedics;  Laterality: Left;  REG/FAB  . CATARACT EXTRACTION W/ INTRAOCULAR LENS  IMPLANT, BILATERAL Bilateral 2017  . COLONOSCOPY  2016  . FOOT SURGERY Right   . PERIPHERAL VASCULAR INTERVENTION  11/11/2019   Procedure: PERIPHERAL VASCULAR INTERVENTION;  Surgeon: Waynetta Sandy, MD;  Location: [facility];  Service: Cardiovascular;;  . TONSILLECTOMY    . VISCERAL ANGIOGRAPHY N/A 11/11/2019   Procedure: VISCERAL ANGIOGRAPHY;  Surgeon: Waynetta Sandy, MD;  Location: [facility];  Service: Cardiovascular;  Laterality: N/A;     Current  Outpatient Medications  Medication Sig Dispense Refill  . acetaminophen (TYLENOL) 650 MG CR tablet Take 650 mg by mouth every 8 (eight) hours as needed for pain.    . celecoxib (CELEBREX) 200 MG capsule TAKE 1 CAPSULE BY MOUTH EVERY DAY (Patient taking differently: Take 200 mg by mouth daily. ) 30 capsule 0  . cephALEXin (KEFLEX) 500 MG capsule Take 500 mg by mouth 2 (two) times daily.    . Cholecalciferol (VITAMIN D3) 2000 units capsule Take 2,000 Units by mouth daily.     [name] ELIQUIS 2.5 MG TABS tablet Take 2.5 mg by mouth 2 (two) times daily. Pt taking 2 mg twice daily    . escitalopram (LEXAPRO) 10 MG tablet Take 15 mg  by mouth daily. Pt taking 1 1/4 tablet daily    . furosemide (LASIX) 20 MG tablet Take 1 tablet (20 mg total) by mouth daily. 30 tablet 0  . ipratropium (ATROVENT) 0.06 % nasal spray Use 2 sprays in each nostril up to three times daily before meals for sinus drainage. (Patient taking differently: Place 2 sprays into the nose daily. ) 30 mL 3  . Lactobacillus-Inulin (CULTURELLE DIGESTIVE HEALTH PO) Take 1 capsule by mouth daily. Take one daily for probiotic     . MULTIPLE VITAMINS-MINERALS PO Take 1 tablet by mouth daily.     . nitrofurantoin (MACRODANTIN) 100 MG capsule Take 100 mg by mouth daily.    . nitroGLYCERIN (NITROSTAT) 0.4 MG SL tablet Place 1 tablet (0.4 mg total) under the tongue every 5 (five) minutes as needed for chest pain. Place one tablet under the tongue every 5 minutes as needed for chest pain. No more than 3 30 tablet 0  . pantoprazole (PROTONIX) 40 MG tablet Take 40 mg by mouth daily.    . pindolol (VISKEN) 5 MG tablet Take 0.5 tablets (2.5 mg total) by mouth 2 (two) times daily. 90 tablet 3  . rOPINIRole (REQUIP) 0.25 MG tablet Take 0.125 mg by mouth daily.     . rosuvastatin (CRESTOR) 10 MG tablet Take 1 tablet (10 mg total) by mouth at bedtime. 30 tablet 11  . traMADol (ULTRAM) 50 MG tablet TAKE 1 TABLET BY MOUTH EVERY 4 HOURS (Patient taking differently: Take 50 mg by mouth every 4 (four) hours as needed for moderate pain or severe pain (pain). ) 75 tablet 0  . vitamin B-12 (CYANOCOBALAMIN) 1000 MCG tablet Take 1,000 mcg by mouth daily.    . clonazePAM (KLONOPIN) 0.5 MG tablet 0.125 mg.  (Patient not taking: Reported on 10/01/2020)    . hydroxypropyl methylcellulose / hypromellose (ISOPTO TEARS / GONIOVISC) 2.5 % ophthalmic solution Place 1 drop into both eyes 3 (three) times daily as needed for dry eyes. (Patient not taking: Reported on 10/01/2020)     No current facility-administered medications for this visit.    Allergies:   Topiramate, Ace inhibitors, Codeine,  Ibandronic acid, Prevacid [lansoprazole], Sulfa antibiotics, Verapamil, and Zolpidem    ROS:  Please see the history of present illness.   Otherwise, review of systems are positive for none .   All other systems are reviewed and negative.    PHYSICAL EXAM: VS:  BP 112/68 (BP Location: Left Arm, Patient Position: Sitting)   Pulse (!) 53   Ht 5\' 4"  (1.626 m)   Wt 116 lb 6.4 oz (52.8 kg)   SpO2 93%   BMI 19.98 kg/m  , BMI Body mass index is 19.98 kg/m.  GENERAL:  Well appearing NECK:  No  jugular venous distention, waveform within normal limits, carotid upstroke brisk and symmetric, no bruits, no thyromegaly LUNGS:  Clear to auscultation bilaterally CHEST:  Unremarkable HEART:  PMI not displaced or sustained,S1 and S2 within normal limits, no S3, no S4, no clicks, no rubs, 2 out of 6 apical systolic murmur radiating slightly at the aortic outflow tract, no diastolic murmurs ABD:  Flat, positive bowel sounds normal in frequency in pitch, no bruits, no rebound, no guarding, no midline pulsatile mass, no hepatomegaly, no splenomegaly EXT:  2 plus pulses throughout, no edema, no cyanosis no clubbing    EKG:  EKG is not ordered today.    Recent Labs: 07/23/2020: B Natriuretic Peptide 223.2; BUN 24; Creatinine, Ser 0.70; Hemoglobin 11.1; Platelets 292; Potassium 5.1; Sodium 133    Lipid Panel    Component Value Date/Time   CHOL 232 (H) 09/14/2016 1415   TRIG 168 (H) 09/14/2016 1415   HDL 99 09/14/2016 1415   CHOLHDL 2.3 09/14/2016 1415   VLDL 34 (H) 09/14/2016 1415   LDLCALC 99 09/14/2016 1415      Wt Readings from Last 3 Encounters:  10/01/20 116 lb 6.4 oz (52.8 kg)  07/23/20 118 lb (53.5 kg)  07/20/20 118 lb 3.2 oz (53.6 kg)      Other studies Reviewed: Additional studies/ records that were reviewed today include: ED records Review of the above records demonstrates:  See elsewhere  ASSESSMENT AND PLAN:  CAD:   She has had no new symptoms since her last stress test in  2017.  No change in therapy.   ATRIAL FIB: She is not having any symptomatic tachypalpitations.  She tolerates anticoagulation.  No change in therapy.  AORTIC STENOSIS:there was no AS on the echo.  This was mild on echo.  I will follow this clinically.  She is not complaining today of shortness of breath.  I will have her continue the low-dose diuretic.  HTN:The blood pressure is at target.  No change in therapy.   PULMONARY HTN: She does have some elevated pulmonary pressures but no overt symptoms related to this.  Again I will manage this medically and continue the current diuretics.   Current medicines are reviewed at length with the patient today.  The patient does not have  concerns regarding medicines.  The following changes have been made: None  Labs/ tests ordered today include:    None  No orders of the defined types were placed in this encounter.    Disposition:   FU with me in 6 months  Signed, Minus Breeding, MD  10/01/2020 1:28 PM    [facility]

## 2020-10-01 ENCOUNTER — Encounter: Payer: Self-pay | Admitting: Cardiology

## 2020-10-01 ENCOUNTER — Other Ambulatory Visit: Payer: Self-pay

## 2020-10-01 ENCOUNTER — Ambulatory Visit (INDEPENDENT_AMBULATORY_CARE_PROVIDER_SITE_OTHER): Payer: Medicare Other | Admitting: Cardiology

## 2020-10-01 VITALS — BP 112/68 | HR 53 | Ht 64.0 in | Wt 116.4 lb

## 2020-10-01 DIAGNOSIS — I350 Nonrheumatic aortic (valve) stenosis: Secondary | ICD-10-CM | POA: Diagnosis not present

## 2020-10-01 DIAGNOSIS — I2510 Atherosclerotic heart disease of native coronary artery without angina pectoris: Secondary | ICD-10-CM | POA: Diagnosis not present

## 2020-10-01 DIAGNOSIS — I480 Paroxysmal atrial fibrillation: Secondary | ICD-10-CM | POA: Diagnosis not present

## 2020-10-01 DIAGNOSIS — I10 Essential (primary) hypertension: Secondary | ICD-10-CM | POA: Diagnosis not present

## 2020-10-01 NOTE — Patient Instructions (Signed)
Medication Instructions:  No changes *If you need a refill on your cardiac medications before your next appointment, please call your pharmacy*   Lab Work: None ordered If you have labs (blood work) drawn today and your tests are completely normal, you will receive your results only by: [name] MyChart Message (if you have MyChart) OR . A paper copy in the mail If you have any lab test that is abnormal or we need to change your treatment, we will call you to review the results.   Testing/Procedures: None ordered   Follow-Up: At [facility], you and your health needs are our priority.  As part of our continuing mission to provide you with exceptional heart care, we have created designated Provider Care Teams.  These Care Teams include your primary Cardiologist (physician) and Advanced Practice Providers (APPs -  Physician Assistants and Nurse Practitioners) who all work together to provide you with the care you need, when you need it.  We recommend signing up for the patient portal called "MyChart".  Sign up information is provided on this After Visit Summary.  MyChart is used to connect with patients for Virtual Visits (Telemedicine).  Patients are able to view lab/test results, encounter notes, upcoming appointments, etc.  Non-urgent messages can be sent to your provider as well.   To learn more about what you can do with MyChart, go to [email or web address].    Your next appointment:   6 month(s)  The format for your next appointment:   In Person  Provider:   Minus Breeding, MD   Other Instructions None

## 2020-10-06 DIAGNOSIS — R0789 Other chest pain: Secondary | ICD-10-CM | POA: Diagnosis not present

## 2020-10-06 DIAGNOSIS — K219 Gastro-esophageal reflux disease without esophagitis: Secondary | ICD-10-CM | POA: Diagnosis not present

## 2020-10-06 DIAGNOSIS — R053 Chronic cough: Secondary | ICD-10-CM | POA: Diagnosis not present

## 2020-10-06 DIAGNOSIS — R748 Abnormal levels of other serum enzymes: Secondary | ICD-10-CM | POA: Diagnosis not present

## 2020-10-06 DIAGNOSIS — R1310 Dysphagia, unspecified: Secondary | ICD-10-CM | POA: Diagnosis not present

## 2020-10-08 DIAGNOSIS — M79675 Pain in left toe(s): Secondary | ICD-10-CM | POA: Diagnosis not present

## 2020-10-08 DIAGNOSIS — L600 Ingrowing nail: Secondary | ICD-10-CM | POA: Diagnosis not present

## 2020-10-08 DIAGNOSIS — M79674 Pain in right toe(s): Secondary | ICD-10-CM | POA: Diagnosis not present

## 2020-10-08 DIAGNOSIS — B351 Tinea unguium: Secondary | ICD-10-CM | POA: Diagnosis not present

## 2020-10-12 DIAGNOSIS — Z23 Encounter for immunization: Secondary | ICD-10-CM | POA: Diagnosis not present

## 2020-11-02 DIAGNOSIS — M545 Low back pain, unspecified: Secondary | ICD-10-CM | POA: Diagnosis not present

## 2020-11-02 DIAGNOSIS — M4120 Other idiopathic scoliosis, site unspecified: Secondary | ICD-10-CM | POA: Diagnosis not present

## 2020-11-02 DIAGNOSIS — M170 Bilateral primary osteoarthritis of knee: Secondary | ICD-10-CM | POA: Diagnosis not present

## 2020-11-03 ENCOUNTER — Other Ambulatory Visit: Payer: Self-pay | Admitting: Orthopedic Surgery

## 2020-11-04 ENCOUNTER — Telehealth: Payer: Self-pay | Admitting: *Deleted

## 2020-11-04 DIAGNOSIS — R748 Abnormal levels of other serum enzymes: Secondary | ICD-10-CM | POA: Diagnosis not present

## 2020-11-04 DIAGNOSIS — R7989 Other specified abnormal findings of blood chemistry: Secondary | ICD-10-CM | POA: Diagnosis not present

## 2020-11-04 NOTE — Telephone Encounter (Signed)
   [facility] Pre-operative Risk Assessment      Request for surgical clearance:  1. What type of surgery is being performed?  Right Carpal tunnel release   2. When is this surgery scheduled? 12/01/2020  3. What type of clearance is required (medical clearance vs. Pharmacy clearance to hold med vs. Both)?both   4. Are there any medications that need to be held prior to surgery and how long? Eliquis  2.5 mg  5. Practice name and name of physician performing surgery? [facility]; Dr Daryll Brod   6. What is the office phone number?  [phone]   7.   What is the office fax number? [name]   Anesthesia type (None, local, MAC, general) ?  IV regional , forearm block   Heather Olsen 11/04/2020, 5:20 PM  _________________________________________________________________   (provider comments below)

## 2020-11-05 NOTE — Telephone Encounter (Signed)
   Primary Cardiologist: Minus Breeding, MD  Chart reviewed as part of pre-operative protocol coverage. Patient was last seen by Dr. Percival Spanish on 10/01/2020. Patient was contacted today for further pre-op evaluation and reported doing well since last visit. No chest pain, shortness of breath, orthopnea, PND, edema, palpitations, lightheadedness, dizziness, syncope. She is able to complete >4.0 METS without any problems. Given past medical history and time since last visit, based on ACC/AHA guidelines, Heather Olsen would be at acceptable risk for the planned procedure without further cardiovascular testing.   The patient was advised that if she develops new symptoms prior to surgery to contact our office to arrange for a follow-up visit, and she verbalized understanding.  Per Pharmacy and office protocol, patient can hold Eliquis for 1-2 days prior to procedure. This should be restarted as soon as possible following procedure.  I will route this recommendation to the requesting party via Epic fax function and remove from pre-op pool.  Please call with questions.  Darreld Mclean, PA-C 11/05/2020, 10:41 AM

## 2020-11-05 NOTE — Telephone Encounter (Signed)
Pharmacy, can you please comment on how long Eliquis can be held for upcoming procedure?  Thank you! 

## 2020-11-05 NOTE — Telephone Encounter (Signed)
Patient with diagnosis of afib on Eliquis for anticoagulation.    Procedure: right carpal tunnel release Date of procedure: 12/01/20  CHA2DS2-VASc Score = 5  This indicates a 7.2% annual risk of stroke. The patient's score is based upon: CHF History: No HTN History: Yes Diabetes History: No Stroke History: No Vascular Disease History: Yes Age Score: 2 Gender Score: 1   CrCl 80mL/min Platelet count 354K  Per office protocol, patient can hold Eliquis for 1-2 days prior to procedure.

## 2020-11-10 DIAGNOSIS — M170 Bilateral primary osteoarthritis of knee: Secondary | ICD-10-CM | POA: Diagnosis not present

## 2020-11-10 DIAGNOSIS — M4120 Other idiopathic scoliosis, site unspecified: Secondary | ICD-10-CM | POA: Diagnosis not present

## 2020-11-12 DIAGNOSIS — M79674 Pain in right toe(s): Secondary | ICD-10-CM | POA: Diagnosis not present

## 2020-11-12 DIAGNOSIS — M79675 Pain in left toe(s): Secondary | ICD-10-CM | POA: Diagnosis not present

## 2020-11-12 DIAGNOSIS — B351 Tinea unguium: Secondary | ICD-10-CM | POA: Diagnosis not present

## 2020-11-12 DIAGNOSIS — L600 Ingrowing nail: Secondary | ICD-10-CM | POA: Diagnosis not present

## 2020-11-17 DIAGNOSIS — M1711 Unilateral primary osteoarthritis, right knee: Secondary | ICD-10-CM | POA: Diagnosis not present

## 2020-11-17 DIAGNOSIS — M4120 Other idiopathic scoliosis, site unspecified: Secondary | ICD-10-CM | POA: Diagnosis not present

## 2020-11-19 ENCOUNTER — Encounter (HOSPITAL_BASED_OUTPATIENT_CLINIC_OR_DEPARTMENT_OTHER): Payer: Self-pay | Admitting: Orthopedic Surgery

## 2020-11-19 ENCOUNTER — Other Ambulatory Visit: Payer: Self-pay

## 2020-11-23 ENCOUNTER — Other Ambulatory Visit: Payer: Self-pay | Admitting: Vascular Surgery

## 2020-11-24 DIAGNOSIS — M1711 Unilateral primary osteoarthritis, right knee: Secondary | ICD-10-CM | POA: Diagnosis not present

## 2020-11-27 ENCOUNTER — Inpatient Hospital Stay (HOSPITAL_COMMUNITY): Admission: RE | Admit: 2020-11-27 | Payer: Medicare Other | Source: Ambulatory Visit

## 2020-11-30 ENCOUNTER — Encounter (HOSPITAL_BASED_OUTPATIENT_CLINIC_OR_DEPARTMENT_OTHER)
Admission: RE | Admit: 2020-11-30 | Discharge: 2020-11-30 | Disposition: A | Discharge status: Home / Self Care | Payer: Medicare Other | Source: Ambulatory Visit | Attending: Orthopedic Surgery | Admitting: Orthopedic Surgery

## 2020-11-30 ENCOUNTER — Other Ambulatory Visit (HOSPITAL_COMMUNITY)
Admission: RE | Admit: 2020-11-30 | Discharge: 2020-11-30 | Disposition: A | Discharge status: Home / Self Care | Payer: Medicare Other | Source: Ambulatory Visit

## 2020-11-30 DIAGNOSIS — Z01812 Encounter for preprocedural laboratory examination: Secondary | ICD-10-CM | POA: Insufficient documentation

## 2020-11-30 DIAGNOSIS — Z20822 Contact with and (suspected) exposure to covid-19: Secondary | ICD-10-CM | POA: Insufficient documentation

## 2020-11-30 DIAGNOSIS — Z833 Family history of diabetes mellitus: Secondary | ICD-10-CM | POA: Diagnosis not present

## 2020-11-30 DIAGNOSIS — Z888 Allergy status to other drugs, medicaments and biological substances status: Secondary | ICD-10-CM | POA: Diagnosis not present

## 2020-11-30 DIAGNOSIS — Z882 Allergy status to sulfonamides status: Secondary | ICD-10-CM | POA: Diagnosis not present

## 2020-11-30 DIAGNOSIS — G5601 Carpal tunnel syndrome, right upper limb: Secondary | ICD-10-CM | POA: Diagnosis not present

## 2020-11-30 DIAGNOSIS — Z8249 Family history of ischemic heart disease and other diseases of the circulatory system: Secondary | ICD-10-CM | POA: Diagnosis not present

## 2020-11-30 DIAGNOSIS — Z885 Allergy status to narcotic agent status: Secondary | ICD-10-CM | POA: Diagnosis not present

## 2020-11-30 LAB — BASIC METABOLIC PANEL
Anion gap: 10 (ref 5–15)
BUN: 45 mg/dL — ABNORMAL HIGH (ref 8–23)
CO2: 28 mmol/L (ref 22–32)
Calcium: 9.9 mg/dL (ref 8.9–10.3)
Chloride: 96 mmol/L — ABNORMAL LOW (ref 98–111)
Creatinine, Ser: 1.05 mg/dL — ABNORMAL HIGH (ref 0.44–1.00)
GFR, Estimated: 51 mL/min — ABNORMAL LOW (ref >60)
Glucose, Bld: 118 mg/dL — ABNORMAL HIGH (ref 70–99)
Potassium: 4.1 mmol/L (ref 3.5–5.1)
Sodium: 134 mmol/L — ABNORMAL LOW (ref 135–145)

## 2020-11-30 LAB — SARS CORONAVIRUS 2 (TAT 6-24 HRS): SARS Coronavirus 2: NEGATIVE

## 2020-11-30 NOTE — Progress Notes (Signed)

## 2020-11-30 NOTE — Anesthesia Preprocedure Evaluation (Addendum)
Anesthesia Evaluation  Patient identified by MRN, date of birth, ID band Patient awake    Reviewed: Allergy & Precautions, NPO status , Patient's Chart, lab work & pertinent test results  Airway Mallampati: II  TM Distance: >3 FB Neck ROM: Full    Dental no notable dental hx. (+) Teeth Intact, Dental Advisory Given   Pulmonary shortness of breath,    Pulmonary exam normal breath sounds clear to auscultation       Cardiovascular Exercise Tolerance: Good hypertension, Pt. on medications + CAD  Normal cardiovascular exam Rhythm:Regular Rate:Normal     Neuro/Psych PSYCHIATRIC DISORDERS Depression negative neurological ROS     GI/Hepatic Neg liver ROS, hiatal hernia,   Endo/Other  negative endocrine ROS  Renal/GU negative Renal ROSK+ 4.1 Cr 1.05     Musculoskeletal negative musculoskeletal ROS (+)   Abdominal   Peds  Hematology negative hematology ROS (+)   Anesthesia Other Findings All see list  Reproductive/Obstetrics                            Anesthesia Physical Anesthesia Plan  ASA: III  Anesthesia Plan: Bier Block and MAC and Bier Block-LIDOCAINE ONLY   Post-op Pain Management:    Induction:   PONV Risk Score and Plan: Treatment may vary due to age or medical condition  Airway Management Planned: Nasal Cannula and Natural Airway  Additional Equipment: None  Intra-op Plan:   Post-operative Plan:   Informed Consent: I have reviewed the patients History and Physical, chart, labs and discussed the procedure including the risks, benefits and alternatives for the proposed anesthesia with the patient or authorized representative who has indicated his/her understanding and acceptance.     Dental advisory given  Plan Discussed with: CRNA and Anesthesiologist  Anesthesia Plan Comments: (Need 2  ivs)       Anesthesia Quick Evaluation

## 2020-12-01 ENCOUNTER — Ambulatory Visit (HOSPITAL_BASED_OUTPATIENT_CLINIC_OR_DEPARTMENT_OTHER): Payer: Medicare Other | Admitting: Anesthesiology

## 2020-12-01 ENCOUNTER — Other Ambulatory Visit: Payer: Self-pay

## 2020-12-01 ENCOUNTER — Ambulatory Visit (HOSPITAL_BASED_OUTPATIENT_CLINIC_OR_DEPARTMENT_OTHER)
Admission: RE | Admit: 2020-12-01 | Discharge: 2020-12-01 | Disposition: A | Discharge status: Home / Self Care | Payer: Medicare Other | Attending: Orthopedic Surgery | Admitting: Orthopedic Surgery

## 2020-12-01 ENCOUNTER — Encounter (HOSPITAL_BASED_OUTPATIENT_CLINIC_OR_DEPARTMENT_OTHER)
Admission: RE | Disposition: A | Discharge status: Home / Self Care | Payer: Self-pay | Source: Home / Self Care | Attending: Orthopedic Surgery

## 2020-12-01 ENCOUNTER — Encounter (HOSPITAL_BASED_OUTPATIENT_CLINIC_OR_DEPARTMENT_OTHER): Payer: Self-pay | Admitting: Orthopedic Surgery

## 2020-12-01 DIAGNOSIS — Z882 Allergy status to sulfonamides status: Secondary | ICD-10-CM | POA: Insufficient documentation

## 2020-12-01 DIAGNOSIS — Z885 Allergy status to narcotic agent status: Secondary | ICD-10-CM | POA: Diagnosis not present

## 2020-12-01 DIAGNOSIS — G5601 Carpal tunnel syndrome, right upper limb: Secondary | ICD-10-CM | POA: Insufficient documentation

## 2020-12-01 DIAGNOSIS — Z8249 Family history of ischemic heart disease and other diseases of the circulatory system: Secondary | ICD-10-CM | POA: Diagnosis not present

## 2020-12-01 DIAGNOSIS — Z833 Family history of diabetes mellitus: Secondary | ICD-10-CM | POA: Diagnosis not present

## 2020-12-01 DIAGNOSIS — Z888 Allergy status to other drugs, medicaments and biological substances status: Secondary | ICD-10-CM | POA: Diagnosis not present

## 2020-12-01 HISTORY — PX: CARPAL TUNNEL RELEASE: SHX101

## 2020-12-01 SURGERY — CARPAL TUNNEL RELEASE
Anesthesia: Monitor Anesthesia Care | Site: Wrist | Laterality: Right

## 2020-12-01 MED ORDER — LIDOCAINE HCL (CARDIAC) PF 100 MG/5ML IV SOSY
PREFILLED_SYRINGE | INTRAVENOUS | Status: DC | PRN
  Administered 2020-12-01: 30 mg via INTRAVENOUS

## 2020-12-01 MED ORDER — PROPOFOL 500 MG/50ML IV EMUL
INTRAVENOUS | Status: AC
  Filled 2020-12-01: qty 50

## 2020-12-01 MED ORDER — MIDAZOLAM HCL 2 MG/2ML IJ SOLN
INTRAMUSCULAR | Status: AC
  Filled 2020-12-01: qty 2

## 2020-12-01 MED ORDER — PROPOFOL 500 MG/50ML IV EMUL
INTRAVENOUS | Status: DC | PRN
  Administered 2020-12-01: 25 ug/kg/min via INTRAVENOUS

## 2020-12-01 MED ORDER — LACTATED RINGERS IV SOLN: INTRAVENOUS | Status: DC

## 2020-12-01 MED ORDER — BUPIVACAINE HCL (PF) 0.25 % IJ SOLN
INTRAMUSCULAR | Status: DC | PRN
  Administered 2020-12-01: 7 mL

## 2020-12-01 MED ORDER — ACETAMINOPHEN 10 MG/ML IV SOLN: 1000.0000 mg | Freq: Once | INTRAVENOUS | Status: DC | PRN

## 2020-12-01 MED ORDER — ONDANSETRON HCL 4 MG/2ML IJ SOLN
INTRAMUSCULAR | Status: AC
  Filled 2020-12-01: qty 2

## 2020-12-01 MED ORDER — SUCCINYLCHOLINE CHLORIDE 200 MG/10ML IV SOSY
PREFILLED_SYRINGE | INTRAVENOUS | Status: AC
  Filled 2020-12-01: qty 10

## 2020-12-01 MED ORDER — CEFAZOLIN SODIUM-DEXTROSE 2-4 GM/100ML-% IV SOLN
INTRAVENOUS | Status: AC
  Filled 2020-12-01: qty 100

## 2020-12-01 MED ORDER — FENTANYL CITRATE (PF) 100 MCG/2ML IJ SOLN: 25.0000 ug | INTRAMUSCULAR | Status: DC | PRN

## 2020-12-01 MED ORDER — FENTANYL CITRATE (PF) 100 MCG/2ML IJ SOLN
INTRAMUSCULAR | Status: AC
  Filled 2020-12-01: qty 2

## 2020-12-01 MED ORDER — LIDOCAINE HCL (PF) 0.5 % IJ SOLN
INTRAMUSCULAR | Status: DC | PRN
  Administered 2020-12-01: 30 mL via INTRAVENOUS

## 2020-12-01 MED ORDER — CEFAZOLIN SODIUM-DEXTROSE 2-4 GM/100ML-% IV SOLN
2.0000 g | INTRAVENOUS | Status: AC
  Administered 2020-12-01: 2 g via INTRAVENOUS

## 2020-12-01 MED ORDER — LIDOCAINE 2% (20 MG/ML) 5 ML SYRINGE
INTRAMUSCULAR | Status: AC
  Filled 2020-12-01: qty 5

## 2020-12-01 MED ORDER — MIDAZOLAM HCL 5 MG/5ML IJ SOLN
INTRAMUSCULAR | Status: DC | PRN
  Administered 2020-12-01: .5 mg via INTRAVENOUS

## 2020-12-01 MED ORDER — FENTANYL CITRATE (PF) 100 MCG/2ML IJ SOLN
INTRAMUSCULAR | Status: DC | PRN
  Administered 2020-12-01: 25 ug via INTRAVENOUS

## 2020-12-01 MED ORDER — ONDANSETRON HCL 4 MG/2ML IJ SOLN
INTRAMUSCULAR | Status: DC | PRN
  Administered 2020-12-01: 4 mg via INTRAVENOUS

## 2020-12-01 MED ORDER — ONDANSETRON HCL 4 MG/2ML IJ SOLN: 4.0000 mg | Freq: Once | INTRAMUSCULAR | Status: DC | PRN

## 2020-12-01 SURGICAL SUPPLY — 33 items
BLADE SURG 15 STRL LF DISP TIS (BLADE) ×1 IMPLANT
BLADE SURG 15 STRL SS (BLADE) ×2
BNDG CMPR 9X4 STRL LF SNTH (GAUZE/BANDAGES/DRESSINGS)
BNDG COHESIVE 3X5 TAN STRL LF (GAUZE/BANDAGES/DRESSINGS) ×2 IMPLANT
BNDG ESMARK 4X9 LF (GAUZE/BANDAGES/DRESSINGS) IMPLANT
BNDG GAUZE ELAST 4 BULKY (GAUZE/BANDAGES/DRESSINGS) ×2 IMPLANT
CHLORAPREP W/TINT 26 (MISCELLANEOUS) ×2 IMPLANT
CORD BIPOLAR FORCEPS 12FT (ELECTRODE) ×2 IMPLANT
COVER BACK TABLE 60X90IN (DRAPES) ×2 IMPLANT
COVER MAYO STAND STRL (DRAPES) ×2 IMPLANT
COVER WAND RF STERILE (DRAPES) IMPLANT
CUFF TOURN SGL QUICK 18X4 (TOURNIQUET CUFF) ×2 IMPLANT
DRAPE EXTREMITY T 121X128X90 (DISPOSABLE) ×2 IMPLANT
DRAPE SURG 17X23 STRL (DRAPES) ×2 IMPLANT
DRSG PAD ABDOMINAL 8X10 ST (GAUZE/BANDAGES/DRESSINGS) ×2 IMPLANT
GAUZE SPONGE 4X4 12PLY STRL (GAUZE/BANDAGES/DRESSINGS) ×2 IMPLANT
GAUZE XEROFORM 1X8 LF (GAUZE/BANDAGES/DRESSINGS) ×2 IMPLANT
GLOVE BIOGEL PI IND STRL 8.5 (GLOVE) ×1 IMPLANT
GLOVE BIOGEL PI INDICATOR 8.5 (GLOVE) ×1
GLOVE SURG ORTHO 8.0 STRL STRW (GLOVE) ×2 IMPLANT
GOWN STRL REUS W/ TWL LRG LVL3 (GOWN DISPOSABLE) ×1 IMPLANT
GOWN STRL REUS W/TWL LRG LVL3 (GOWN DISPOSABLE) ×2
GOWN STRL REUS W/TWL XL LVL3 (GOWN DISPOSABLE) ×2 IMPLANT
NEEDLE PRECISIONGLIDE 27X1.5 (NEEDLE) IMPLANT
NS IRRIG 1000ML POUR BTL (IV SOLUTION) ×2 IMPLANT
PACK BASIN DAY SURGERY FS (CUSTOM PROCEDURE TRAY) ×2 IMPLANT
STOCKINETTE 4X48 STRL (DRAPES) ×2 IMPLANT
SUT ETHILON 4 0 PS 2 18 (SUTURE) ×2 IMPLANT
SUT VICRYL 4-0 PS2 18IN ABS (SUTURE) IMPLANT
SYR BULB EAR ULCER 3OZ GRN STR (SYRINGE) ×2 IMPLANT
SYR CONTROL 10ML LL (SYRINGE) IMPLANT
TOWEL GREEN STERILE FF (TOWEL DISPOSABLE) ×2 IMPLANT
UNDERPAD 30X36 HEAVY ABSORB (UNDERPADS AND DIAPERS) ×2 IMPLANT

## 2020-12-01 NOTE — Discharge Instructions (Addendum)
Regional Anesthesia Blocks ° °1. Numbness or the inability to move the "blocked" extremity may last from 3-48 hours after placement. The length of time depends on the medication injected and your individual response to the medication. If the numbness is not going away after 48 hours, call your surgeon. ° °2. The extremity that is blocked will need to be protected until the numbness is gone and the  Strength has returned. Because you cannot feel it, you will need to take extra care to avoid injury. Because it may be weak, you may have difficulty moving it or using it. You may not know what position it is in without looking at it while the block is in effect. ° °3. For blocks in the legs and feet, returning to weight bearing and walking needs to be done carefully. You will need to wait until the numbness is entirely gone and the strength has returned. You should be able to move your leg and foot normally before you try and bear weight or walk. You will need someone to be with you when you first try to ensure you do not fall and possibly risk injury. ° °4. Bruising and tenderness at the needle site are common side effects and will resolve in a few days. ° °5. Persistent numbness or new problems with movement should be communicated to the surgeon or the  Surgery Center ([phone])/ [facility] ([phone]). ° ° ° °Post Anesthesia Home Care Instructions ° °Activity: °Get plenty of rest for the remainder of the day. A responsible individual must stay with you for 24 hours following the procedure.  °For the next 24 hours, DO NOT: °-Drive a car °-Operate machinery °-Drink alcoholic beverages °-Take any medication unless instructed by your physician °-Make any legal decisions or sign important papers. ° °Meals: °Start with liquid foods such as gelatin or soup. Progress to regular foods as tolerated. Avoid greasy, spicy, heavy foods. If nausea and/or vomiting occur, drink only clear liquids until the  nausea and/or vomiting subsides. Call your physician if vomiting continues. ° °Special Instructions/Symptoms: °Your throat may feel dry or sore from the anesthesia or the breathing tube placed in your throat during surgery. If this causes discomfort, gargle with warm salt water. The discomfort should disappear within 24 hours. ° °If you had a scopolamine patch placed behind your ear for the management of post- operative nausea and/or vomiting: ° °1. The medication in the patch is effective for 72 hours, after which it should be removed.  Wrap patch in a tissue and discard in the trash. Wash hands thoroughly with soap and water. °2. You may remove the patch earlier than 72 hours if you experience unpleasant side effects which may include dry mouth, dizziness or visual disturbances. °3. Avoid touching the patch. Wash your hands with soap and water after contact with the patch. °  °Hand Center Instructions °Hand Surgery ° °Wound Care: °Keep your hand elevated above the level of your heart.  Do not allow it to dangle by your side.  Keep the dressing dry and do not remove it unless your doctor advises you to do so.  He will usually change it at the time of your post-op visit.  Moving your fingers is advised to stimulate circulation but will depend on the site of your surgery.  If you have a splint applied, your doctor will advise you regarding movement. ° °Activity: °Do not drive or operate machinery today.  Rest today and then you may   return to your normal activity and work as indicated by your physician. ° °Diet:  °Drink liquids today or eat a light diet.  You may resume a regular diet tomorrow.   ° °General expectations: °Pain for two to three days. °Fingers may become slightly swollen. ° °Call your doctor if any of the following occur: °Severe pain not relieved by pain medication. °Elevated temperature. °Dressing soaked with blood. °Inability to move fingers. °White or bluish color to fingers. ° °

## 2020-12-01 NOTE — Anesthesia Postprocedure Evaluation (Signed)
Anesthesia Post Note  Patient: Heather Olsen  Procedure(s) Performed: CARPAL TUNNEL RELEASE (Right Wrist)     Patient location during evaluation: PACU Anesthesia Type: MAC and Bier Block Level of consciousness: awake and alert Pain management: pain level controlled Vital Signs Assessment: post-procedure vital signs reviewed and stable Respiratory status: spontaneous breathing, nonlabored ventilation, respiratory function stable and patient connected to nasal cannula oxygen Cardiovascular status: stable and blood pressure returned to baseline Postop Assessment: no apparent nausea or vomiting Anesthetic complications: no   No complications documented.  Last Vitals:  Vitals:   12/01/20 1230 12/01/20 1239  BP: 124/61 119/69  Pulse: (!) 47 (!) 44  Resp: 14 16  Temp:  36.6 C  SpO2: 96% 99%    Last Pain:  Vitals:   12/01/20 1239  TempSrc:   PainSc: 0-No pain                 Barnet Glasgow

## 2020-12-01 NOTE — Anesthesia Procedure Notes (Signed)
Procedure Name: MAC Date/Time: 12/01/2020 11:35 AM Performed by: Willa Frater, CRNA Pre-anesthesia Checklist: Patient identified, Emergency Drugs available, Suction available, Patient being monitored and Timeout performed Patient Re-evaluated:Patient Re-evaluated prior to induction Oxygen Delivery Method: Simple face mask

## 2020-12-01 NOTE — Anesthesia Procedure Notes (Signed)
Anesthesia Regional Block: Bier block (IV Regional)   Pre-Anesthetic Checklist: ,, timeout performed, Correct Patient, Correct Site, Correct Laterality, Correct Procedure,, site marked, surgical consent,, at surgeon's request  Laterality: Right     Needles:  Injection technique: Single-shot  Needle Type: Other      Needle Gauge: 20     Additional Needles:   Procedures:,,,,, intact distal pulses, Esmarch exsanguination, single tourniquet utilized,  Narrative:   Performed by: Personally       

## 2020-12-01 NOTE — Transfer of Care (Signed)
Immediate Anesthesia Transfer of Care Note  Patient: Heather Olsen  Procedure(s) Performed: CARPAL TUNNEL RELEASE (Right Wrist)  Patient Location: PACU  Anesthesia Type:MAC and Bier block  Level of Consciousness: awake, alert , oriented and drowsy  Airway & Oxygen Therapy: Patient Spontanous Breathing and Patient connected to face mask oxygen  Post-op Assessment: Report given to RN and Post -op Vital signs reviewed and stable  Post vital signs: Reviewed and stable  Last Vitals:  Vitals Value Taken Time  BP    Temp    Pulse    Resp    SpO2      Last Pain:  Vitals:   12/01/20 1049  TempSrc: Oral  PainSc: 0-No pain      Patients Stated Pain Goal: 3 (37/62/83 1517)  Complications: No complications documented.

## 2020-12-01 NOTE — Brief Op Note (Signed)
12/01/2020  11:58 AM  PATIENT:  Heather Olsen  87 y.o. female  PRE-OPERATIVE DIAGNOSIS:  RIGHT CARPAL TUNNEL SYNDROME  POST-OPERATIVE DIAGNOSIS:  RIGHT CARPAL TUNNEL SYNDROME  PROCEDURE:  Procedure(s) with comments: CARPAL TUNNEL RELEASE (Right) - IV REGIONAL FOREARM BLOCK  SURGEON:  Surgeon(s) and Role:    * Cindee Salt, MD - Primary  PHYSICIAN ASSISTANT:   ASSISTANTS: none   ANESTHESIA:   local, regional and IV sedation  EBL:  5 mL   BLOOD ADMINISTERED:none  DRAINS: none   LOCAL MEDICATIONS USED:  BUPIVICAINE   SPECIMEN:  No Specimen  DISPOSITION OF SPECIMEN:  N/A  COUNTS:  YES  TOURNIQUET:   Total Tourniquet Time Documented: Forearm (Right) - 21 minutes Total: Forearm (Right) - 21 minutes   DICTATION: .Dragon Dictation  PLAN OF CARE: Discharge to home after PACU  PATIENT DISPOSITION:  PACU - hemodynamically stable.

## 2020-12-01 NOTE — H&P (Signed)
Heather Olsen is an 87 y.o. female.   Chief Complaint: Numbness right hand HPI: Heather Olsen is an 87 year old female who was last seen in 2018. This was for bilateral carpal tunnel and C[facility] arthritis she is post left carpal tunnel release. She returns now for continued treatment of her right side. She had nerve conductions done by Dr. Darnell Olsen revealing cervical problems along with carpal tunnel syndrome bilaterally right greater than left with no sensory response to either median nerve, and motor delays bilaterally. He has a prior history of a scaphoid fracture on the right side treated with screw fixation. She has a history of diabetes no history of thyroid problems or gout. She has a history of arthritis. Family history is positive diabetes negative for thyroid problems arthritis and gout. She is complaining of constant numbness and tingling. She has been wearing splints which have not resolve symptoms for her. She states the left carpal tunnel has done very well. She states nothing makes it better or worse. All fingers are involved.     Past Medical History:  Diagnosis Date  . Aortic stenosis 04/12/2016   none noted on echo done 08/13/20  . Coronary artery disease   . Depression   . Diverticulosis   . Hearing loss 04/12/2016  . Hiatal hernia 04/12/2016  . HLD (hyperlipidemia) 04/12/2016  . Hyperglycemia 09/14/2016  . Hypertension   . Idiopathic scoliosis 04/12/2016  . Major depression 04/12/2016  . Mitral regurgitation   . Osteoporosis, senile 04/12/2016  . Pernicious anemia   . Raynaud's disease 04/12/2016  . Scoliosis   . Spinal stenosis of lumbar region 04/12/2016    Past Surgical History:  Procedure Laterality Date  . APPENDECTOMY    . BREAST SURGERY    . broken wrist Right 2007  . CARPAL TUNNEL RELEASE Left 04/04/2017   Procedure: LEFT CARPAL TUNNEL RELEASE;  Surgeon: Heather Salt, MD;  Location: [facility];  Service: Orthopedics;  Laterality: Left;  REG/FAB  . CATARACT  EXTRACTION W/ INTRAOCULAR LENS  IMPLANT, BILATERAL Bilateral 2017  . COLONOSCOPY  2016  . FOOT SURGERY Right   . PERIPHERAL VASCULAR INTERVENTION  11/11/2019   Procedure: PERIPHERAL VASCULAR INTERVENTION;  Surgeon: Heather Harman, MD;  Location: [facility] INVASIVE CV LAB;  Service: Cardiovascular;;  . TONSILLECTOMY    . VISCERAL ANGIOGRAPHY N/A 11/11/2019   Procedure: VISCERAL ANGIOGRAPHY;  Surgeon: Heather Harman, MD;  Location: [facility] INVASIVE CV LAB;  Service: Cardiovascular;  Laterality: N/A;    Family History  Problem Relation Age of Onset  . Heart disease Mother        Died 64, MR, no CAD  . Heart disease Father        CHF, no CAD..   . HIV Daughter 58       from bone tissue transplant   Social History:  reports that she has never smoked. She has never used smokeless tobacco. She reports current alcohol use. She reports that she does not use drugs.  Allergies:  Allergies  Allergen Reactions  . Topiramate Rash  . Ace Inhibitors Cough  . Codeine Nausea Only  . Ibandronic Acid Nausea Only  . Prevacid [Lansoprazole]     Chest pain  . Sulfa Antibiotics Other (See Comments)    headaches  . Verapamil Other (See Comments)    constipation  . Zolpidem Other (See Comments)    hallucinations    No medications prior to admission.    Results for orders placed or performed during the hospital  encounter of 12/01/20 (from the past 48 hour(s))  Basic metabolic panel per protocol     Status: Abnormal   Collection Time: 11/30/20  2:30 PM  Result Value Ref Range   Sodium 134 (L) 135 - 145 mmol/L   Potassium 4.1 3.5 - 5.1 mmol/L   Chloride 96 (L) 98 - 111 mmol/L   CO2 28 22 - 32 mmol/L   Glucose, Bld 118 (H) 70 - 99 mg/dL    Comment: Glucose reference range applies only to samples taken after fasting for at least 8 hours.   BUN 45 (H) 8 - 23 mg/dL   Creatinine, Ser 1.05 (H) 0.44 - 1.00 mg/dL   Calcium 9.9 8.9 - 10.3 mg/dL   GFR, Estimated 51 (L) >60 mL/min     Comment: (NOTE) Calculated using the CKD-EPI Creatinine Equation (2021)    Anion gap 10 5 - 15    Comment: Performed at [facility] [address], [place], [place] [address]    No results found.   Pertinent items are noted in HPI.  Height 5\' 4"  (1.626 m), weight 52.8 kg.  General appearance: alert, cooperative and appears stated age Head: Normocephalic, without obvious abnormality Neck: no JVD Resp: clear to auscultation bilaterally Cardio: regular rate and rhythm, S1, S2 normal, no murmur, click, rub or gallop GI: soft, non-tender; bowel sounds normal; no masses,  no organomegaly Extremities: numbness right hand Pulses: 2+ and symmetric Skin: Skin color, texture, turgor normal. No rashes or lesions Neurologic: Grossly normal Incision/Wound: na  Assessment/Plan Diagnosis right carpal tunnel syndrome. She is advised that she probably does have a double crush. He has changes on her nerve conductions done by Dr. Thereasa Olsen Plan: She does have changes in her nerve conductions are referable to her neck and a probable double crush. She is advised that her little fingers involved in the carpal tunnel release may not resolve that for her. She would like to proceed to have left carpal tunnel release. She does not want to have anything done to the [facility] arthritis. She is scheduled for carpal tunnel release right hand as an outpatient under regional anesthesia. Preperi-and postoperative course are discussed along with risks and complications. She is aware there is no guarantee to the surgery the possibility of infection recurrence injury to arteries nerves tendons complete relief symptoms and dystrophy.     Heather Olsen 12/01/2020, 9:26 AM

## 2020-12-01 NOTE — Op Note (Signed)
NAME: Heather Olsen MEDICAL RECORD NO: [id number] DATE OF BIRTH: Aug 03, 1931 FACILITY: [facility] LOCATION: [facility] PHYSICIAN: Nicki Reaper, MD   OPERATIVE REPORT   DATE OF PROCEDURE: 12/01/20    PREOPERATIVE DIAGNOSIS:   Carpal tunnel syndrome right hand   POSTOPERATIVE DIAGNOSIS:   Same   PROCEDURE:   Compression median nerve right hand   SURGEON: Cindee Salt, M.D.   ASSISTANT: none   ANESTHESIA:  Bier block with sedation and Local   INTRAVENOUS FLUIDS:  Per anesthesia flow sheet.   ESTIMATED BLOOD LOSS:  Minimal.   COMPLICATIONS:  None.   SPECIMENS:  none   TOURNIQUET TIME:    Total Tourniquet Time Documented: Forearm (Right) - 21 minutes Total: Forearm (Right) - 21 minutes    DISPOSITION:  Stable to PACU.   INDICATIONS: Patient is an 87 year old female with a history of numbness and tingling.  Nerve conductions are positive she is undergone decompression median nerve on her opposite hand is admitted now for the second side.  Preperi-and postoperative course been discussed along with risk complications.  She is aware that there is no guarantee to the surgery the possibility of infection recurrence injury to arteries nerves tendons complete relief of symptoms dystrophy.  In the preoperative area the patient seen the extremity marked by both patient and surgeon antibiotic given  OPERATIVE COURSE: Patient is brought to the operating room where form based IV regional anesthetic was carried out without difficulty and under the direction of the anesthesia department after she was placed in the supine position with the right arm free.  A prep was done with ChloraPrep.  A 3-minute dry time was allowed and timeout taken to confirm patient procedure.  A longitudinal incision was made in the right palm carried down through subcutaneous tissue.  Bleeders were electrocauterized with bipolar.  The palmar fascia was split.  Superficial palmar arch was identified along with  the flexor tendon to the ring little finger.  Retractors were placed retracting flexor tendons median nerve radially ulnar nerve ulnarly.  Flexor retinaculum was then released on its ulnar aspect.  A right angle and stool retractor placed between skin and forearm fascia.  The fascia was then released proximally for approximately 3 cm proximal to the wrist crease under direct vision.  The canal was explored.  Motor branch entered the muscle distally.  An area compression to the nerve was apparent no further lesions were identified.  The wound was copiously irrigated with saline.  The skin was closed with interrupted 4-0 nylon sutures.  Local infiltration quarter percent bupivacaine without epinephrine was given.  A sterile compressive dressing with fingers 3 was applied.  Deflation of the tourniquet all fingers immediately pink.  She was taken to the recovery room for observation in satisfactory condition.  She will be discharged home to return to the hand center [facility] in 1 week with Tylenol ibuprofen for pain with Ultram for breakthrough.   Cindee Salt, MD Electronically signed, 12/01/20

## 2020-12-02 ENCOUNTER — Encounter (HOSPITAL_BASED_OUTPATIENT_CLINIC_OR_DEPARTMENT_OTHER): Payer: Self-pay | Admitting: Orthopedic Surgery

## 2020-12-09 DIAGNOSIS — B351 Tinea unguium: Secondary | ICD-10-CM | POA: Diagnosis not present

## 2020-12-09 DIAGNOSIS — M79674 Pain in right toe(s): Secondary | ICD-10-CM | POA: Diagnosis not present

## 2020-12-09 DIAGNOSIS — M79675 Pain in left toe(s): Secondary | ICD-10-CM | POA: Diagnosis not present

## 2020-12-09 DIAGNOSIS — L600 Ingrowing nail: Secondary | ICD-10-CM | POA: Diagnosis not present

## 2020-12-21 DIAGNOSIS — M5416 Radiculopathy, lumbar region: Secondary | ICD-10-CM | POA: Diagnosis not present

## 2020-12-22 DIAGNOSIS — E538 Deficiency of other specified B group vitamins: Secondary | ICD-10-CM | POA: Diagnosis not present

## 2020-12-28 DIAGNOSIS — M5416 Radiculopathy, lumbar region: Secondary | ICD-10-CM | POA: Diagnosis not present

## 2020-12-28 DIAGNOSIS — M47896 Other spondylosis, lumbar region: Secondary | ICD-10-CM | POA: Diagnosis not present

## 2020-12-28 DIAGNOSIS — M6281 Muscle weakness (generalized): Secondary | ICD-10-CM | POA: Diagnosis not present

## 2020-12-28 DIAGNOSIS — M545 Low back pain, unspecified: Secondary | ICD-10-CM | POA: Diagnosis not present

## 2020-12-31 DIAGNOSIS — M47896 Other spondylosis, lumbar region: Secondary | ICD-10-CM | POA: Diagnosis not present

## 2020-12-31 DIAGNOSIS — M545 Low back pain, unspecified: Secondary | ICD-10-CM | POA: Diagnosis not present

## 2020-12-31 DIAGNOSIS — M6281 Muscle weakness (generalized): Secondary | ICD-10-CM | POA: Diagnosis not present

## 2020-12-31 DIAGNOSIS — M5416 Radiculopathy, lumbar region: Secondary | ICD-10-CM | POA: Diagnosis not present

## 2021-01-01 ENCOUNTER — Other Ambulatory Visit: Payer: Self-pay

## 2021-01-01 ENCOUNTER — Ambulatory Visit (HOSPITAL_COMMUNITY)
Admission: RE | Admit: 2021-01-01 | Discharge: 2021-01-01 | Disposition: A | Discharge status: Home / Self Care | Payer: Medicare Other | Source: Ambulatory Visit | Attending: Physician Assistant | Admitting: Physician Assistant

## 2021-01-01 ENCOUNTER — Ambulatory Visit (INDEPENDENT_AMBULATORY_CARE_PROVIDER_SITE_OTHER): Payer: Medicare Other | Admitting: Physician Assistant

## 2021-01-01 VITALS — BP 134/79 | HR 57 | Temp 98.0°F | Resp 20 | Ht 64.0 in | Wt 115.4 lb

## 2021-01-01 DIAGNOSIS — K551 Chronic vascular disorders of intestine: Secondary | ICD-10-CM | POA: Diagnosis not present
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
## 2021-01-06 DIAGNOSIS — M5416 Radiculopathy, lumbar region: Secondary | ICD-10-CM | POA: Diagnosis not present

## 2021-01-06 DIAGNOSIS — M6281 Muscle weakness (generalized): Secondary | ICD-10-CM | POA: Diagnosis not present

## 2021-01-06 DIAGNOSIS — M545 Low back pain, unspecified: Secondary | ICD-10-CM | POA: Diagnosis not present

## 2021-01-06 DIAGNOSIS — B351 Tinea unguium: Secondary | ICD-10-CM | POA: Diagnosis not present

## 2021-01-06 DIAGNOSIS — M79674 Pain in right toe(s): Secondary | ICD-10-CM | POA: Diagnosis not present

## 2021-01-06 DIAGNOSIS — M47896 Other spondylosis, lumbar region: Secondary | ICD-10-CM | POA: Diagnosis not present

## 2021-01-06 DIAGNOSIS — M79675 Pain in left toe(s): Secondary | ICD-10-CM | POA: Diagnosis not present

## 2021-01-06 DIAGNOSIS — L600 Ingrowing nail: Secondary | ICD-10-CM | POA: Diagnosis not present

## 2021-01-11 DIAGNOSIS — M6281 Muscle weakness (generalized): Secondary | ICD-10-CM | POA: Diagnosis not present

## 2021-01-11 DIAGNOSIS — M5416 Radiculopathy, lumbar region: Secondary | ICD-10-CM | POA: Diagnosis not present

## 2021-01-11 DIAGNOSIS — M47896 Other spondylosis, lumbar region: Secondary | ICD-10-CM | POA: Diagnosis not present

## 2021-01-11 DIAGNOSIS — M545 Low back pain, unspecified: Secondary | ICD-10-CM | POA: Diagnosis not present

## 2021-01-14 DIAGNOSIS — M6281 Muscle weakness (generalized): Secondary | ICD-10-CM | POA: Diagnosis not present

## 2021-01-14 DIAGNOSIS — M545 Low back pain, unspecified: Secondary | ICD-10-CM | POA: Diagnosis not present

## 2021-01-14 DIAGNOSIS — M5416 Radiculopathy, lumbar region: Secondary | ICD-10-CM | POA: Diagnosis not present

## 2021-01-14 DIAGNOSIS — M47896 Other spondylosis, lumbar region: Secondary | ICD-10-CM | POA: Diagnosis not present

## 2021-01-18 DIAGNOSIS — M6281 Muscle weakness (generalized): Secondary | ICD-10-CM | POA: Diagnosis not present

## 2021-01-18 DIAGNOSIS — M47896 Other spondylosis, lumbar region: Secondary | ICD-10-CM | POA: Diagnosis not present

## 2021-01-18 DIAGNOSIS — M545 Low back pain, unspecified: Secondary | ICD-10-CM | POA: Diagnosis not present

## 2021-01-18 DIAGNOSIS — M5416 Radiculopathy, lumbar region: Secondary | ICD-10-CM | POA: Diagnosis not present

## 2021-01-21 DIAGNOSIS — M545 Low back pain, unspecified: Secondary | ICD-10-CM | POA: Diagnosis not present

## 2021-01-21 DIAGNOSIS — M5416 Radiculopathy, lumbar region: Secondary | ICD-10-CM | POA: Diagnosis not present

## 2021-01-21 DIAGNOSIS — H04123 Dry eye syndrome of bilateral lacrimal glands: Secondary | ICD-10-CM | POA: Diagnosis not present

## 2021-01-21 DIAGNOSIS — H02202 Unspecified lagophthalmos right lower eyelid: Secondary | ICD-10-CM | POA: Diagnosis not present

## 2021-01-21 DIAGNOSIS — H02205 Unspecified lagophthalmos left lower eyelid: Secondary | ICD-10-CM | POA: Diagnosis not present

## 2021-01-21 DIAGNOSIS — M47896 Other spondylosis, lumbar region: Secondary | ICD-10-CM | POA: Diagnosis not present

## 2021-01-21 DIAGNOSIS — M6281 Muscle weakness (generalized): Secondary | ICD-10-CM | POA: Diagnosis not present

## 2021-01-25 DIAGNOSIS — M47896 Other spondylosis, lumbar region: Secondary | ICD-10-CM | POA: Diagnosis not present

## 2021-01-25 DIAGNOSIS — M5416 Radiculopathy, lumbar region: Secondary | ICD-10-CM | POA: Diagnosis not present

## 2021-01-25 DIAGNOSIS — M6281 Muscle weakness (generalized): Secondary | ICD-10-CM | POA: Diagnosis not present

## 2021-01-25 DIAGNOSIS — M545 Low back pain, unspecified: Secondary | ICD-10-CM | POA: Diagnosis not present

## 2021-01-28 DIAGNOSIS — M6281 Muscle weakness (generalized): Secondary | ICD-10-CM | POA: Diagnosis not present

## 2021-01-28 DIAGNOSIS — M47896 Other spondylosis, lumbar region: Secondary | ICD-10-CM | POA: Diagnosis not present

## 2021-01-28 DIAGNOSIS — M545 Low back pain, unspecified: Secondary | ICD-10-CM | POA: Diagnosis not present

## 2021-01-28 DIAGNOSIS — M5416 Radiculopathy, lumbar region: Secondary | ICD-10-CM | POA: Diagnosis not present

## 2021-02-01 DIAGNOSIS — M47896 Other spondylosis, lumbar region: Secondary | ICD-10-CM | POA: Diagnosis not present

## 2021-02-01 DIAGNOSIS — M545 Low back pain, unspecified: Secondary | ICD-10-CM | POA: Diagnosis not present

## 2021-02-01 DIAGNOSIS — M5416 Radiculopathy, lumbar region: Secondary | ICD-10-CM | POA: Diagnosis not present

## 2021-02-01 DIAGNOSIS — M6281 Muscle weakness (generalized): Secondary | ICD-10-CM | POA: Diagnosis not present

## 2021-02-19 DIAGNOSIS — E782 Mixed hyperlipidemia: Secondary | ICD-10-CM | POA: Diagnosis not present

## 2021-02-19 DIAGNOSIS — I10 Essential (primary) hypertension: Secondary | ICD-10-CM | POA: Diagnosis not present

## 2021-02-24 DIAGNOSIS — E782 Mixed hyperlipidemia: Secondary | ICD-10-CM | POA: Diagnosis not present

## 2021-02-24 DIAGNOSIS — E118 Type 2 diabetes mellitus with unspecified complications: Secondary | ICD-10-CM | POA: Diagnosis not present

## 2021-02-24 DIAGNOSIS — D6859 Other primary thrombophilia: Secondary | ICD-10-CM | POA: Diagnosis not present

## 2021-02-24 DIAGNOSIS — G2581 Restless legs syndrome: Secondary | ICD-10-CM | POA: Diagnosis not present

## 2021-02-24 DIAGNOSIS — I4891 Unspecified atrial fibrillation: Secondary | ICD-10-CM | POA: Diagnosis not present

## 2021-02-24 DIAGNOSIS — M810 Age-related osteoporosis without current pathological fracture: Secondary | ICD-10-CM | POA: Diagnosis not present

## 2021-02-24 DIAGNOSIS — N1831 Chronic kidney disease, stage 3a: Secondary | ICD-10-CM | POA: Diagnosis not present

## 2021-02-24 DIAGNOSIS — I2510 Atherosclerotic heart disease of native coronary artery without angina pectoris: Secondary | ICD-10-CM | POA: Diagnosis not present

## 2021-02-24 DIAGNOSIS — D519 Vitamin B12 deficiency anemia, unspecified: Secondary | ICD-10-CM | POA: Diagnosis not present

## 2021-02-24 DIAGNOSIS — M8580 Other specified disorders of bone density and structure, unspecified site: Secondary | ICD-10-CM | POA: Diagnosis not present

## 2021-02-24 DIAGNOSIS — I10 Essential (primary) hypertension: Secondary | ICD-10-CM | POA: Diagnosis not present

## 2021-02-24 DIAGNOSIS — Z0000 Encounter for general adult medical examination without abnormal findings: Secondary | ICD-10-CM | POA: Diagnosis not present

## 2021-03-08 DIAGNOSIS — H04123 Dry eye syndrome of bilateral lacrimal glands: Secondary | ICD-10-CM | POA: Diagnosis not present

## 2021-03-10 DIAGNOSIS — M7751 Other enthesopathy of right foot: Secondary | ICD-10-CM | POA: Diagnosis not present

## 2021-03-10 DIAGNOSIS — M2041 Other hammer toe(s) (acquired), right foot: Secondary | ICD-10-CM | POA: Diagnosis not present

## 2021-03-10 DIAGNOSIS — G5761 Lesion of plantar nerve, right lower limb: Secondary | ICD-10-CM | POA: Diagnosis not present

## 2021-03-24 DIAGNOSIS — E538 Deficiency of other specified B group vitamins: Secondary | ICD-10-CM | POA: Diagnosis not present

## 2021-03-30 ENCOUNTER — Ambulatory Visit: Payer: Medicare Other | Admitting: Cardiology

## 2021-04-11 DIAGNOSIS — I272 Pulmonary hypertension, unspecified: Secondary | ICD-10-CM | POA: Insufficient documentation

## 2021-04-11 NOTE — Progress Notes (Signed)
Cardiology Office Note   Date:  04/13/2021   ID:  Heather Olsen, Heather Olsen Jun 07, 1931, MRN [id number]  PCP:  Deland Pretty, MD  Cardiologist:   Minus Breeding, MD   Chief Complaint  Patient presents with  . Atrial Fibrillation      History of Present Illness: Heather Olsen is a 87 y.o. female who presents for follow up of CAD. She had stent placement in 2008 and was followed by [facility].  She had an LAD stent placed in 2008 which was a 3.8 mm Vision. She had follow-up cath in 2009 with 30%  narrowing of the stent but otherwise no disease. Stress perfusion study in 2014 was negative. She does have some tricuspid regurgitation and some mild mitral regurgitation.  Lexiscan Myoview was negative in 2017.   She had dizziness and was found to have short runs of atrial fib.   She had tachy brady palpitations and so I switcher her beta blocker to pindolol.   She had SOB and an echo demonstrated NL LV function with moderately elevated pulmonary pressures.   BNP was mildly elevated.    Since I last saw her she is doing well.  The patient denies any new symptoms such as chest discomfort, neck or arm discomfort. There has been no new shortness of breath, PND or orthopnea. There have been no reported palpitations, presyncope or syncope.  She likes to go to [facility].   Specifically the palpitations she was having before she says her not particularly problematic.  She has not felt any fibrillation.  In addition she has not had the dyspnea that she was having but she says she has been more sedentary than she should be.   Past Medical History:  Diagnosis Date  . Aortic stenosis 04/12/2016   none noted on echo done 08/13/20  . Coronary artery disease   . Depression   . Diverticulosis   . Hearing loss 04/12/2016  . Hiatal hernia 04/12/2016  . HLD (hyperlipidemia) 04/12/2016  . Hyperglycemia 09/14/2016  . Hypertension   . Idiopathic scoliosis 04/12/2016  . Major depression 04/12/2016  .  Mitral regurgitation   . Osteoporosis, senile 04/12/2016  . Pernicious anemia   . Raynaud's disease 04/12/2016  . Scoliosis   . Spinal stenosis of lumbar region 04/12/2016    Past Surgical History:  Procedure Laterality Date  . APPENDECTOMY    . BREAST SURGERY    . broken wrist Right 2007  . CARPAL TUNNEL RELEASE Left 04/04/2017   Procedure: LEFT CARPAL TUNNEL RELEASE;  Surgeon: Daryll Brod, MD;  Location: [place];  Service: Orthopedics;  Laterality: Left;  REG/FAB  . CARPAL TUNNEL RELEASE Right 12/01/2020   Procedure: CARPAL TUNNEL RELEASE;  Surgeon: Daryll Brod, MD;  Location: [place];  Service: Orthopedics;  Laterality: Right;  IV REGIONAL FOREARM BLOCK  . CATARACT EXTRACTION W/ INTRAOCULAR LENS  IMPLANT, BILATERAL Bilateral 2017  . COLONOSCOPY  2016  . FOOT SURGERY Right   . PERIPHERAL VASCULAR INTERVENTION  11/11/2019   Procedure: PERIPHERAL VASCULAR INTERVENTION;  Surgeon: Waynetta Sandy, MD;  Location: [place] [facility];  Service: Cardiovascular;;  . TONSILLECTOMY    . VISCERAL ANGIOGRAPHY N/A 11/11/2019   Procedure: VISCERAL ANGIOGRAPHY;  Surgeon: Waynetta Sandy, MD;  Location: [place] [facility];  Service: Cardiovascular;  Laterality: N/A;     Current Outpatient Medications  Medication Sig Dispense Refill  . acetaminophen (TYLENOL) 650 MG CR tablet 2 tablets    .  celecoxib (CELEBREX) 200 MG capsule TAKE 1 CAPSULE BY MOUTH EVERY DAY (Patient taking differently: Take 200 mg by mouth daily.) 30 capsule 0  . Cholecalciferol (VITAMIN D3) 2000 units capsule Take 2,000 Units by mouth daily.     [name] denosumab (PROLIA) 60 MG/ML SOSY injection 60 mg    . ELIQUIS 2.5 MG TABS tablet Take 2.5 mg by mouth 2 (two) times daily. Pt taking 2 mg twice daily    . escitalopram (LEXAPRO) 10 MG tablet Take 15 mg by mouth daily. Pt taking 1 1/4 tablet daily    . furosemide (LASIX) 20 MG tablet Take 1 tablet (20 mg total) by mouth daily. 30 tablet  0  . gabapentin (NEURONTIN) 300 MG capsule Take 300 mg by mouth at bedtime.    [name] ipratropium (ATROVENT) 0.06 % nasal spray Use 2 sprays in each nostril up to three times daily before meals for sinus drainage. (Patient taking differently: Place 2 sprays into the nose daily.) 30 mL 3  . Lactobacillus-Inulin (CULTURELLE DIGESTIVE HEALTH PO) Take 1 capsule by mouth daily. Take one daily for probiotic    . MULTIPLE VITAMINS-MINERALS PO Take 1 tablet by mouth daily.     . nitrofurantoin (MACRODANTIN) 100 MG capsule Take 100 mg by mouth daily.    . nitroGLYCERIN (NITROSTAT) 0.4 MG SL tablet Place 1 tablet (0.4 mg total) under the tongue every 5 (five) minutes as needed for chest pain. Place one tablet under the tongue every 5 minutes as needed for chest pain. No more than 3 30 tablet 0  . pantoprazole (PROTONIX) 40 MG tablet Take 40 mg by mouth daily.    . pindolol (VISKEN) 5 MG tablet Take 0.5 tablets (2.5 mg total) by mouth 2 (two) times daily. (Patient taking differently: Take 2.5 mg by mouth 2 (two) times daily.) 90 tablet 3  . rOPINIRole (REQUIP) 0.25 MG tablet Take 0.125 mg by mouth daily.     . rosuvastatin (CRESTOR) 10 MG tablet TAKE 1 TABLET BY MOUTH AT BEDTIME 30 tablet 0  . traMADol (ULTRAM) 50 MG tablet TAKE 1 TABLET BY MOUTH EVERY 4 HOURS (Patient taking differently: Take 50 mg by mouth every 4 (four) hours as needed for moderate pain or severe pain (pain).) 75 tablet 0  . vitamin B-12 (CYANOCOBALAMIN) 1000 MCG tablet Take 1,000 mcg by mouth daily.     No current facility-administered medications for this visit.    Allergies:   Topiramate, Ace inhibitors, Codeine, Ibandronic acid, Prevacid [lansoprazole], Sulfa antibiotics, Verapamil, and Zolpidem    ROS:  Please see the history of present illness.   Otherwise, review of systems are positive for back pain.   All other systems are reviewed and negative.    PHYSICAL EXAM: VS:  BP (!) 160/77   Pulse (!) 57   Ht 5\' 4"  (1.626 m)   Wt 119  lb (54 kg)   SpO2 95%   BMI 20.43 kg/m  , BMI Body mass index is 20.43 kg/m.  GENERAL:  Well appearing NECK:  No jugular venous distention, waveform within normal limits, carotid upstroke brisk and symmetric, no bruits, no thyromegaly LUNGS:  Clear to auscultation bilaterally CHEST:  Unremarkable HEART:  PMI not displaced or sustained,S1 and S2 within normal limits, no S3, no S4, no clicks, no rubs, 2 out of 6 apical systolic murmur radiating slightly at aortic athletic tract, no diastolic murmurs ABD:  Flat, positive bowel sounds normal in frequency in pitch, no bruits, no rebound, no guarding, no midline  pulsatile mass, no hepatomegaly, no splenomegaly EXT:  2 plus pulses throughout, no edema, no cyanosis no clubbing, varicose vein   EKG:  EKG is not ordered today.    Recent Labs: 07/23/2020: B Natriuretic Peptide 223.2; Hemoglobin 11.1; Platelets 292 11/30/2020: BUN 45; Creatinine, Ser 1.05; Potassium 4.1; Sodium 134    Lipid Panel    Component Value Date/Time   CHOL 232 (H) 09/14/2016 1415   TRIG 168 (H) 09/14/2016 1415   HDL 99 09/14/2016 1415   CHOLHDL 2.3 09/14/2016 1415   VLDL 34 (H) 09/14/2016 1415   LDLCALC 99 09/14/2016 1415      Wt Readings from Last 3 Encounters:  04/13/21 119 lb (54 kg)  01/01/21 115 lb 6.4 oz (52.3 kg)  12/01/20 114 lb 10.2 oz (52 kg)      Other studies Reviewed: Additional studies/ records that were reviewed today include:  Labs Review of the above records demonstrates:  See elsewhere  ASSESSMENT AND PLAN:  CAD:    The patient has no new sypmtoms.  No further cardiovascular testing is indicated.  We will continue with aggressive risk reduction and meds as listed.  ATRIAL FIB: She does not feel this.  She tolerates anticoagulation.  No change in therapy.  MURMUR:   There was no evidence of AS on echo last year.  No further work up.   HTN:The blood pressure was repeated and down to 035 systolic later in the appt.  No change in  therapy.   PULMONARY HTN:       This was moderate and not associated with symptoms.  No change in therapy.    Current medicines are reviewed at length with the patient today.  The patient does not have  concerns regarding medicines.  The following changes have been made: none   Labs/ tests ordered today include:    None  No orders of the defined types were placed in this encounter.    Disposition:   FU with me in 12 months  Signed, Minus Breeding, MD  04/13/2021 3:46 PM    [facility]

## 2021-04-13 ENCOUNTER — Other Ambulatory Visit: Payer: Self-pay

## 2021-04-13 ENCOUNTER — Encounter: Payer: Self-pay | Admitting: Cardiology

## 2021-04-13 ENCOUNTER — Ambulatory Visit (INDEPENDENT_AMBULATORY_CARE_PROVIDER_SITE_OTHER): Payer: Medicare Other | Admitting: Cardiology

## 2021-04-13 VITALS — BP 160/77 | HR 57 | Ht 64.0 in | Wt 119.0 lb

## 2021-04-13 DIAGNOSIS — R011 Cardiac murmur, unspecified: Secondary | ICD-10-CM

## 2021-04-13 DIAGNOSIS — I2510 Atherosclerotic heart disease of native coronary artery without angina pectoris: Secondary | ICD-10-CM | POA: Diagnosis not present

## 2021-04-13 DIAGNOSIS — I10 Essential (primary) hypertension: Secondary | ICD-10-CM | POA: Diagnosis not present

## 2021-04-13 DIAGNOSIS — I272 Pulmonary hypertension, unspecified: Secondary | ICD-10-CM

## 2021-04-13 DIAGNOSIS — I480 Paroxysmal atrial fibrillation: Secondary | ICD-10-CM | POA: Diagnosis not present

## 2021-04-13 NOTE — Patient Instructions (Signed)

## 2021-05-06 DIAGNOSIS — R109 Unspecified abdominal pain: Secondary | ICD-10-CM | POA: Diagnosis not present

## 2021-05-06 DIAGNOSIS — K580 Irritable bowel syndrome with diarrhea: Secondary | ICD-10-CM | POA: Diagnosis not present

## 2021-05-20 DIAGNOSIS — Z23 Encounter for immunization: Secondary | ICD-10-CM | POA: Diagnosis not present

## 2021-05-24 DIAGNOSIS — L820 Inflamed seborrheic keratosis: Secondary | ICD-10-CM | POA: Diagnosis not present

## 2021-05-24 DIAGNOSIS — L738 Other specified follicular disorders: Secondary | ICD-10-CM | POA: Diagnosis not present

## 2021-05-24 DIAGNOSIS — D485 Neoplasm of uncertain behavior of skin: Secondary | ICD-10-CM | POA: Diagnosis not present

## 2021-06-09 DIAGNOSIS — N390 Urinary tract infection, site not specified: Secondary | ICD-10-CM | POA: Diagnosis not present

## 2021-06-09 DIAGNOSIS — R351 Nocturia: Secondary | ICD-10-CM | POA: Diagnosis not present

## 2021-07-06 ENCOUNTER — Other Ambulatory Visit: Payer: Self-pay | Admitting: Cardiology

## 2021-07-14 DIAGNOSIS — F419 Anxiety disorder, unspecified: Secondary | ICD-10-CM | POA: Diagnosis not present

## 2021-07-14 DIAGNOSIS — R110 Nausea: Secondary | ICD-10-CM | POA: Diagnosis not present

## 2021-07-14 DIAGNOSIS — F3341 Major depressive disorder, recurrent, in partial remission: Secondary | ICD-10-CM | POA: Diagnosis not present

## 2021-07-19 DIAGNOSIS — R110 Nausea: Secondary | ICD-10-CM | POA: Diagnosis not present

## 2021-07-19 DIAGNOSIS — K5900 Constipation, unspecified: Secondary | ICD-10-CM | POA: Diagnosis not present

## 2021-07-19 DIAGNOSIS — K219 Gastro-esophageal reflux disease without esophagitis: Secondary | ICD-10-CM | POA: Diagnosis not present

## 2021-07-19 DIAGNOSIS — R1319 Other dysphagia: Secondary | ICD-10-CM | POA: Diagnosis not present

## 2021-07-24 DIAGNOSIS — R300 Dysuria: Secondary | ICD-10-CM | POA: Diagnosis not present

## 2021-07-24 DIAGNOSIS — R21 Rash and other nonspecific skin eruption: Secondary | ICD-10-CM | POA: Diagnosis not present

## 2021-07-26 DIAGNOSIS — L820 Inflamed seborrheic keratosis: Secondary | ICD-10-CM | POA: Diagnosis not present

## 2021-07-26 DIAGNOSIS — L570 Actinic keratosis: Secondary | ICD-10-CM | POA: Diagnosis not present

## 2021-07-26 DIAGNOSIS — D1801 Hemangioma of skin and subcutaneous tissue: Secondary | ICD-10-CM | POA: Diagnosis not present

## 2021-07-26 DIAGNOSIS — L821 Other seborrheic keratosis: Secondary | ICD-10-CM | POA: Diagnosis not present

## 2021-07-28 DIAGNOSIS — Z8659 Personal history of other mental and behavioral disorders: Secondary | ICD-10-CM | POA: Diagnosis not present

## 2021-08-04 ENCOUNTER — Telehealth: Payer: Self-pay | Admitting: *Deleted

## 2021-08-04 NOTE — Telephone Encounter (Signed)
Left VM  Will send to pharmD for eliquis hold.

## 2021-08-04 NOTE — Telephone Encounter (Signed)
   [facility] Pre-operative Risk Assessment    Patient Name: Heather Olsen  DOB: [birth year] MRN: [id number]  HEARTCARE STAFF:  - IMPORTANT!!!!!! Under Visit Info/Reason for Call, type in Other and utilize the format Clearance MM/DD/YY or Clearance TBD. Do not use dashes or single digits. - Please review there is not already an duplicate clearance open for this procedure. - If request is for dental extraction, please clarify the # of teeth to be extracted. - If the patient is currently at the dentist's office, call Pre-Op Callback Staff (MA/nurse) to input urgent request.  - If the patient is not currently in the dentist office, please route to the Pre-Op pool.  Request for surgical clearance:  What type of surgery is being performed? ENDOSCOPY  When is this surgery scheduled? 08/26/21  What type of clearance is required (medical clearance vs. Pharmacy clearance to hold med vs. Both)? BOTH  Are there any medications that need to be held prior to surgery and how long?  [place] name and name of physician performing surgery? [facility]; DR. JEFFREY MEDOFF  What is the office phone number? [phone]   7.   What is the office fax number? [phone]  8.   Anesthesia type (None, local, MAC, general) ? NOT LISTED (PROPOFOL?)   Julaine Hua 08/04/2021, 10:47 AM  _________________________________________________________________   (provider comments below)

## 2021-08-05 DIAGNOSIS — E1129 Type 2 diabetes mellitus with other diabetic kidney complication: Secondary | ICD-10-CM | POA: Insufficient documentation

## 2021-08-05 DIAGNOSIS — E118 Type 2 diabetes mellitus with unspecified complications: Secondary | ICD-10-CM | POA: Insufficient documentation

## 2021-08-05 DIAGNOSIS — E119 Type 2 diabetes mellitus without complications: Secondary | ICD-10-CM | POA: Insufficient documentation

## 2021-08-05 NOTE — Telephone Encounter (Signed)
Patient with diagnosis of afib on Eliquis for anticoagulation.    Procedure: endoscopy Date of procedure: 08/26/21  CHA2DS2-VASc Score = 6  This indicates a 9.7% annual risk of stroke. The patient's score is based upon: CHF History: 0 HTN History: 1 Diabetes History: 1 Stroke History: 0 Vascular Disease History: 1 Age Score: 2 Gender Score: 1  DM noted in PCP notes and A1c > 6.5, I have added dx in Epic as this was previously not documented.  CrCl 67m/min Platelet count 354K  Per office protocol, patient can hold Eliquis for 2 days prior to procedure.

## 2021-08-10 DIAGNOSIS — Z23 Encounter for immunization: Secondary | ICD-10-CM | POA: Diagnosis not present

## 2021-08-10 DIAGNOSIS — F3341 Major depressive disorder, recurrent, in partial remission: Secondary | ICD-10-CM | POA: Diagnosis not present

## 2021-08-11 DIAGNOSIS — R351 Nocturia: Secondary | ICD-10-CM | POA: Diagnosis not present

## 2021-08-17 NOTE — Telephone Encounter (Signed)
I called Dr. Liliane Channel office to go over notes from pre op provider Melina Copa, [facility], see notes about Eliquis. Left my direct extension for surgery scheduler to call me back. I will also fax notes over to their office at this time as well.

## 2021-08-17 NOTE — Telephone Encounter (Signed)
   Name: Heather Olsen  DOB: [birth year]  MRN: [id number]   Primary Cardiologist: Minus Breeding, MD  Chart reviewed as part of pre-operative protocol coverage. Patient was contacted again 08/17/2021 in reference to pre-operative risk assessment for pending surgery as outlined below. We had not yet received a call back from her from the original VM we left. Heather Olsen was last seen on 03/2021 by Dr. Percival Spanish. I reached out to patient for update on how she is doing. The patient affirms she has been doing well without any new cardiac symptoms. Therefore, based on ACC/AHA guidelines, the patient would be at acceptable risk for the planned procedure without further cardiovascular testing. The patient was advised that if she develops new symptoms prior to surgery to contact our office to arrange for a follow-up visit, and she verbalized understanding.  Per our pharmacy team, Per office protocol, patient can hold Eliquis for 2 days prior to procedure. The patient tells me that the endoscopy is actually scheduled for tomorrow and she had not yet begun holding her Eliquis - took last night's dose. I told her this may impact whether or not the GI team can move forward with her procedure as our original clearance gave the date of 08/26/21. I will route to callback team to call GI to notify them that we would recommend a 2-day hold as above, but that patient had not yet begun holding this. Please ask them to call patient back with her final procedure instructions depending on if they have to move the date.   I will also route this recommendation to the requesting party via Epic fax function and remove from pre-op pool. Please call with questions.  Charlie Pitter, PA-C 08/17/2021, 10:57 AM

## 2021-08-17 NOTE — Telephone Encounter (Signed)
[name] from Dr. Liliane Channel office called back. I went over the notes from the pre op provider Melina Copa, PAC. See notes. I did confirm with [name] procedure is set for tomorrow, which was moved up from 08/26/21. [name] states that their office faxed over a new clearance last week with the new date moved up to 08/18/21. I explained that I was on vacation and out of the office. I did assure there is coverage for when I am not in the office. I cannot confirm though if a fax came over or not with the new date. I did apologize if new fax was received in our office that the clearance was not updated then with the NEW procedure date of 08/18/21. [name] states she will need to s/w Dr. Earlean Shawl and that they will have to reschedule the pt. [name] said she will then send over a new clearance. I said if pt is being rescheduled within the next 1-2 weeks no need to fax new clearance request. [name], did raise her voice some stating that Dr. Earlean Shawl is retiring and they will not be able to get pt scheduled that quickly as he is all the way out into November. I then states yes we will then need a new clearance once pt has been rescheduled.   I will fax notes to Dr. Liliane Channel office.

## 2021-08-18 DIAGNOSIS — Z23 Encounter for immunization: Secondary | ICD-10-CM | POA: Diagnosis not present

## 2021-08-19 DIAGNOSIS — K2289 Other specified disease of esophagus: Secondary | ICD-10-CM | POA: Diagnosis not present

## 2021-08-19 DIAGNOSIS — T478X5A Adverse effect of other agents primarily affecting gastrointestinal system, initial encounter: Secondary | ICD-10-CM | POA: Diagnosis not present

## 2021-08-19 DIAGNOSIS — R1310 Dysphagia, unspecified: Secondary | ICD-10-CM | POA: Diagnosis not present

## 2021-08-19 DIAGNOSIS — K317 Polyp of stomach and duodenum: Secondary | ICD-10-CM | POA: Diagnosis not present

## 2021-08-19 DIAGNOSIS — K2960 Other gastritis without bleeding: Secondary | ICD-10-CM | POA: Diagnosis not present

## 2021-08-19 DIAGNOSIS — K219 Gastro-esophageal reflux disease without esophagitis: Secondary | ICD-10-CM | POA: Diagnosis not present

## 2021-08-19 DIAGNOSIS — R110 Nausea: Secondary | ICD-10-CM | POA: Diagnosis not present

## 2021-08-19 DIAGNOSIS — R6881 Early satiety: Secondary | ICD-10-CM | POA: Diagnosis not present

## 2021-08-31 DIAGNOSIS — M810 Age-related osteoporosis without current pathological fracture: Secondary | ICD-10-CM | POA: Diagnosis not present

## 2021-08-31 DIAGNOSIS — R1310 Dysphagia, unspecified: Secondary | ICD-10-CM | POA: Diagnosis not present

## 2021-08-31 DIAGNOSIS — K2289 Other specified disease of esophagus: Secondary | ICD-10-CM | POA: Diagnosis not present

## 2021-08-31 DIAGNOSIS — K219 Gastro-esophageal reflux disease without esophagitis: Secondary | ICD-10-CM | POA: Diagnosis not present

## 2021-08-31 DIAGNOSIS — K317 Polyp of stomach and duodenum: Secondary | ICD-10-CM | POA: Diagnosis not present

## 2021-09-17 DIAGNOSIS — M25562 Pain in left knee: Secondary | ICD-10-CM | POA: Diagnosis not present

## 2021-09-17 DIAGNOSIS — M545 Low back pain, unspecified: Secondary | ICD-10-CM | POA: Diagnosis not present

## 2021-09-21 DIAGNOSIS — F3341 Major depressive disorder, recurrent, in partial remission: Secondary | ICD-10-CM | POA: Diagnosis not present

## 2021-09-22 DIAGNOSIS — Z961 Presence of intraocular lens: Secondary | ICD-10-CM | POA: Diagnosis not present

## 2021-09-22 DIAGNOSIS — H26492 Other secondary cataract, left eye: Secondary | ICD-10-CM | POA: Diagnosis not present

## 2021-09-22 DIAGNOSIS — H04123 Dry eye syndrome of bilateral lacrimal glands: Secondary | ICD-10-CM | POA: Diagnosis not present

## 2021-09-22 DIAGNOSIS — H5213 Myopia, bilateral: Secondary | ICD-10-CM | POA: Diagnosis not present

## 2021-09-28 ENCOUNTER — Other Ambulatory Visit: Payer: Self-pay

## 2021-09-28 ENCOUNTER — Ambulatory Visit (INDEPENDENT_AMBULATORY_CARE_PROVIDER_SITE_OTHER): Payer: Medicare Other | Admitting: Plastic Surgery

## 2021-09-28 ENCOUNTER — Encounter: Payer: Self-pay | Admitting: Plastic Surgery

## 2021-09-28 VITALS — BP 137/81 | HR 63 | Ht 64.0 in | Wt 115.0 lb

## 2021-09-28 DIAGNOSIS — H02839 Dermatochalasis of unspecified eye, unspecified eyelid: Secondary | ICD-10-CM | POA: Insufficient documentation

## 2021-09-28 DIAGNOSIS — H02834 Dermatochalasis of left upper eyelid: Secondary | ICD-10-CM | POA: Diagnosis not present

## 2021-09-28 DIAGNOSIS — H02831 Dermatochalasis of right upper eyelid: Secondary | ICD-10-CM

## 2021-09-28 DIAGNOSIS — I2510 Atherosclerotic heart disease of native coronary artery without angina pectoris: Secondary | ICD-10-CM | POA: Diagnosis not present

## 2021-09-28 DIAGNOSIS — I480 Paroxysmal atrial fibrillation: Secondary | ICD-10-CM

## 2021-09-28 NOTE — Progress Notes (Signed)
Patient ID: Heather Olsen, female    DOB: 25-Apr-1931, 87 y.o.   MRN: [id number]   Chief Complaint  Patient presents with   consult    The patient is an 87 year old female here for evaluation of her upper lids.  She had 2 blepharoplasties in the past.  Her most recent surgery was 4 years ago by an Equities trader here in town.  She has some dry eyes which she treats with Refresh and some lateral irritation of the eye which she treats with an ointment at night.  She is 5 feet 4 inches tall weighs 115 pounds.  She is not a smoker.  She has atrial fibrillation and is on Eliquis.  Her past medical history and past surgical history is listed below.  She looks very good for her age and has nice skin texture.  She has a very short upper lid on both sides with tenting laterally.  It appears to be less than a centimeter of the upper lid.  She does not have excess fat.  Her brows are actually fairly high.  What she does not like is the slight bit of excess brow scan that hangs over her lids.   Review of Systems  Constitutional: Negative.   Eyes: Negative.   Respiratory: Negative.    Cardiovascular: Negative.   Gastrointestinal: Negative.   Endocrine: Negative.   Genitourinary: Negative.   Skin: Negative.   Hematological: Negative.    Past Medical History:  Diagnosis Date   Aortic stenosis 04/12/2016   none noted on echo done 08/13/20   Coronary artery disease    Depression    Diverticulosis    Hearing loss 04/12/2016   Hiatal hernia 04/12/2016   HLD (hyperlipidemia) 04/12/2016   Hyperglycemia 09/14/2016   Hypertension    Idiopathic scoliosis 04/12/2016   Major depression 04/12/2016   Mitral regurgitation    Osteoporosis, senile 04/12/2016   Pernicious anemia    Raynaud's disease 04/12/2016   Scoliosis    Spinal stenosis of lumbar region 04/12/2016    Past Surgical History:  Procedure Laterality Date   APPENDECTOMY     BREAST SURGERY     broken wrist Right 2007   CARPAL TUNNEL  RELEASE Left 04/04/2017   Procedure: LEFT CARPAL TUNNEL RELEASE;  Surgeon: Daryll Brod, MD;  Location: [place];  Service: Orthopedics;  Laterality: Left;  REG/FAB   CARPAL TUNNEL RELEASE Right 12/01/2020   Procedure: CARPAL TUNNEL RELEASE;  Surgeon: Daryll Brod, MD;  Location: [facility];  Service: Orthopedics;  Laterality: Right;  IV REGIONAL FOREARM BLOCK   CATARACT EXTRACTION W/ INTRAOCULAR LENS  IMPLANT, BILATERAL Bilateral 2017   COLONOSCOPY  2016   FOOT SURGERY Right    PERIPHERAL VASCULAR INTERVENTION  11/11/2019   Procedure: PERIPHERAL VASCULAR INTERVENTION;  Surgeon: Waynetta Sandy, MD;  Location: [facility];  Service: Cardiovascular;;   TONSILLECTOMY     VISCERAL ANGIOGRAPHY N/A 11/11/2019   Procedure: VISCERAL ANGIOGRAPHY;  Surgeon: Waynetta Sandy, MD;  Location: [facility];  Service: Cardiovascular;  Laterality: N/A;      Current Outpatient Medications:    acetaminophen (TYLENOL) 650 MG CR tablet, 2 tablets, Disp: , Rfl:    celecoxib (CELEBREX) 200 MG capsule, TAKE 1 CAPSULE BY MOUTH EVERY DAY (Patient taking differently: Take 200 mg by mouth daily.), Disp: 30 capsule, Rfl: 0   Cholecalciferol (VITAMIN D3) 2000 units capsule, Take 2,000 Units by mouth daily. , Disp: , Rfl:  denosumab (PROLIA) 60 MG/ML SOSY injection, 60 mg, Disp: , Rfl:    ELIQUIS 2.5 MG TABS tablet, Take 2.5 mg by mouth 2 (two) times daily. Pt taking 2 mg twice daily, Disp: , Rfl:    escitalopram (LEXAPRO) 10 MG tablet, Take 15 mg by mouth daily. Pt taking 1 1/4 tablet daily, Disp: , Rfl:    furosemide (LASIX) 20 MG tablet, Take 1 tablet (20 mg total) by mouth daily., Disp: 30 tablet, Rfl: 0   gabapentin (NEURONTIN) 300 MG capsule, Take 300 mg by mouth at bedtime., Disp: , Rfl:    ipratropium (ATROVENT) 0.06 % nasal spray, Use 2 sprays in each nostril up to three times daily before meals for sinus drainage. (Patient taking differently: Place 2  sprays into the nose daily.), Disp: 30 mL, Rfl: 3   Lactobacillus-Inulin (CULTURELLE DIGESTIVE HEALTH PO), Take 1 capsule by mouth daily. Take one daily for probiotic, Disp: , Rfl:    MULTIPLE VITAMINS-MINERALS PO, Take 1 tablet by mouth daily. , Disp: , Rfl:    nitrofurantoin (MACRODANTIN) 100 MG capsule, Take 100 mg by mouth daily., Disp: , Rfl:    nitroGLYCERIN (NITROSTAT) 0.4 MG SL tablet, Place 1 tablet (0.4 mg total) under the tongue every 5 (five) minutes as needed for chest pain. Place one tablet under the tongue every 5 minutes as needed for chest pain. No more than 3, Disp: 30 tablet, Rfl: 0   pantoprazole (PROTONIX) 40 MG tablet, Take 40 mg by mouth daily., Disp: , Rfl:    pindolol (VISKEN) 5 MG tablet, Take 1/2 (one-half) tablet by mouth twice daily, Disp: 90 tablet, Rfl: 0   rOPINIRole (REQUIP) 0.25 MG tablet, Take 0.125 mg by mouth daily. , Disp: , Rfl:    rosuvastatin (CRESTOR) 10 MG tablet, TAKE 1 TABLET BY MOUTH AT BEDTIME, Disp: 30 tablet, Rfl: 0   traMADol (ULTRAM) 50 MG tablet, TAKE 1 TABLET BY MOUTH EVERY 4 HOURS (Patient taking differently: Take 50 mg by mouth every 4 (four) hours as needed for moderate pain or severe pain (pain).), Disp: 75 tablet, Rfl: 0   vitamin B-12 (CYANOCOBALAMIN) 1000 MCG tablet, Take 1,000 mcg by mouth daily., Disp: , Rfl:    Objective:   Vitals:   09/28/21 1409  BP: 137/81  Pulse: 63  SpO2: 95%    Physical Exam Vitals and nursing note reviewed.  HENT:     Head: Normocephalic and atraumatic.  Cardiovascular:     Rate and Rhythm: Normal rate.     Pulses: Normal pulses.  Pulmonary:     Effort: Pulmonary effort is normal.  Skin:    Coloration: Skin is not jaundiced.     Findings: No bruising or lesion.  Neurological:     Mental Status: She is alert and oriented to person, place, and time.  Psychiatric:        Mood and Affect: Mood normal.        Behavior: Behavior normal.        Thought Content: Thought content normal.     Assessment & Plan:  PAF (paroxysmal atrial fibrillation) (HCC)  Dermatochalasis of both upper eyelids  Pictures were obtained of the patient and placed in the chart with the patient's or guardian's permission.  I expressed to the patient that I was very concerned that any more excision of the upper lids would create a dry eye issue.  She is aware that she likely already has 1 and does not want it to be worse.  I  think at this point she is agreed that no more surgery of her upper lids is best.  I did talk to her about laser for overall facial wrinkling and skin health.  I gave her the website and she is going to take a look at the halo laser.  [place], DO

## 2021-10-07 ENCOUNTER — Other Ambulatory Visit: Payer: Self-pay | Admitting: Cardiology

## 2021-10-25 DIAGNOSIS — R7303 Prediabetes: Secondary | ICD-10-CM | POA: Diagnosis not present

## 2021-10-27 DIAGNOSIS — R7303 Prediabetes: Secondary | ICD-10-CM | POA: Diagnosis not present

## 2021-11-25 DIAGNOSIS — M25561 Pain in right knee: Secondary | ICD-10-CM | POA: Diagnosis not present

## 2022-01-03 DIAGNOSIS — N390 Urinary tract infection, site not specified: Secondary | ICD-10-CM | POA: Diagnosis not present

## 2022-01-07 ENCOUNTER — Other Ambulatory Visit: Payer: Self-pay | Admitting: Cardiology

## 2022-01-07 DIAGNOSIS — N3000 Acute cystitis without hematuria: Secondary | ICD-10-CM | POA: Diagnosis not present

## 2022-01-07 DIAGNOSIS — R413 Other amnesia: Secondary | ICD-10-CM | POA: Diagnosis not present

## 2022-01-07 DIAGNOSIS — K5900 Constipation, unspecified: Secondary | ICD-10-CM | POA: Diagnosis not present

## 2022-01-12 DIAGNOSIS — R351 Nocturia: Secondary | ICD-10-CM | POA: Diagnosis not present

## 2022-01-12 DIAGNOSIS — N393 Stress incontinence (female) (male): Secondary | ICD-10-CM | POA: Diagnosis not present
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
## 2022-01-22 ENCOUNTER — Other Ambulatory Visit: Payer: Self-pay

## 2022-01-22 DIAGNOSIS — K551 Chronic vascular disorders of intestine: Secondary | ICD-10-CM

## 2022-01-25 ENCOUNTER — Telehealth: Payer: Self-pay | Admitting: *Deleted

## 2022-01-25 DIAGNOSIS — Z8719 Personal history of other diseases of the digestive system: Secondary | ICD-10-CM | POA: Insufficient documentation

## 2022-01-25 DIAGNOSIS — M7062 Trochanteric bursitis, left hip: Secondary | ICD-10-CM | POA: Insufficient documentation

## 2022-01-25 DIAGNOSIS — Z9889 Other specified postprocedural states: Secondary | ICD-10-CM | POA: Insufficient documentation

## 2022-01-25 DIAGNOSIS — R413 Other amnesia: Secondary | ICD-10-CM | POA: Insufficient documentation

## 2022-01-25 DIAGNOSIS — R1012 Left upper quadrant pain: Secondary | ICD-10-CM | POA: Insufficient documentation

## 2022-01-25 DIAGNOSIS — Z0000 Encounter for general adult medical examination without abnormal findings: Secondary | ICD-10-CM | POA: Insufficient documentation

## 2022-01-25 DIAGNOSIS — R233 Spontaneous ecchymoses: Secondary | ICD-10-CM | POA: Insufficient documentation

## 2022-01-25 DIAGNOSIS — L570 Actinic keratosis: Secondary | ICD-10-CM | POA: Insufficient documentation

## 2022-01-25 DIAGNOSIS — R61 Generalized hyperhidrosis: Secondary | ICD-10-CM | POA: Insufficient documentation

## 2022-01-25 DIAGNOSIS — K1379 Other lesions of oral mucosa: Secondary | ICD-10-CM | POA: Diagnosis not present

## 2022-01-25 DIAGNOSIS — K580 Irritable bowel syndrome with diarrhea: Secondary | ICD-10-CM | POA: Insufficient documentation

## 2022-01-25 DIAGNOSIS — F419 Anxiety disorder, unspecified: Secondary | ICD-10-CM | POA: Insufficient documentation

## 2022-01-25 DIAGNOSIS — Z955 Presence of coronary angioplasty implant and graft: Secondary | ICD-10-CM | POA: Insufficient documentation

## 2022-01-25 DIAGNOSIS — I7300 Raynaud's syndrome without gangrene: Secondary | ICD-10-CM | POA: Insufficient documentation

## 2022-01-25 DIAGNOSIS — M546 Pain in thoracic spine: Secondary | ICD-10-CM | POA: Insufficient documentation

## 2022-01-25 DIAGNOSIS — M4125 Other idiopathic scoliosis, thoracolumbar region: Secondary | ICD-10-CM | POA: Insufficient documentation

## 2022-01-25 DIAGNOSIS — Z7982 Long term (current) use of aspirin: Secondary | ICD-10-CM | POA: Insufficient documentation

## 2022-01-25 DIAGNOSIS — K219 Gastro-esophageal reflux disease without esophagitis: Secondary | ICD-10-CM | POA: Insufficient documentation

## 2022-01-25 DIAGNOSIS — K5900 Constipation, unspecified: Secondary | ICD-10-CM | POA: Insufficient documentation

## 2022-01-25 DIAGNOSIS — E7800 Pure hypercholesterolemia, unspecified: Secondary | ICD-10-CM | POA: Insufficient documentation

## 2022-01-25 DIAGNOSIS — E1165 Type 2 diabetes mellitus with hyperglycemia: Secondary | ICD-10-CM | POA: Insufficient documentation

## 2022-01-25 DIAGNOSIS — D6859 Other primary thrombophilia: Secondary | ICD-10-CM | POA: Insufficient documentation

## 2022-01-25 DIAGNOSIS — N1831 Chronic kidney disease, stage 3a: Secondary | ICD-10-CM | POA: Insufficient documentation

## 2022-01-25 DIAGNOSIS — M1120 Other chondrocalcinosis, unspecified site: Secondary | ICD-10-CM | POA: Insufficient documentation

## 2022-01-25 DIAGNOSIS — M1189 Other specified crystal arthropathies, multiple sites: Secondary | ICD-10-CM | POA: Insufficient documentation

## 2022-01-25 DIAGNOSIS — R351 Nocturia: Secondary | ICD-10-CM | POA: Insufficient documentation

## 2022-01-25 DIAGNOSIS — G2581 Restless legs syndrome: Secondary | ICD-10-CM | POA: Insufficient documentation

## 2022-01-25 DIAGNOSIS — I509 Heart failure, unspecified: Secondary | ICD-10-CM | POA: Insufficient documentation

## 2022-01-25 DIAGNOSIS — Z7901 Long term (current) use of anticoagulants: Secondary | ICD-10-CM | POA: Insufficient documentation

## 2022-01-25 DIAGNOSIS — F5104 Psychophysiologic insomnia: Secondary | ICD-10-CM | POA: Insufficient documentation

## 2022-01-25 DIAGNOSIS — Z8659 Personal history of other mental and behavioral disorders: Secondary | ICD-10-CM | POA: Insufficient documentation

## 2022-01-25 DIAGNOSIS — R110 Nausea: Secondary | ICD-10-CM | POA: Insufficient documentation

## 2022-01-25 DIAGNOSIS — F3340 Major depressive disorder, recurrent, in remission, unspecified: Secondary | ICD-10-CM | POA: Insufficient documentation

## 2022-01-25 NOTE — Telephone Encounter (Signed)
° °  Pre-operative Risk Assessment    Patient Name: Heather Olsen  DOB: 18-Dec-1930 MRN: [id number]      Request for Surgical Clearance    Procedure:   TONGUE Bx  Date of Surgery:  Clearance TBD                                 Surgeon:  DR. Romie Minus Surgeon's Group or Practice Name:  [facility], [place] Phone number:  [phone] Fax number:  [phone] ATTN: Ian Malkin   Type of Clearance Requested:   - Medical  - Pharmacy:  Hold Apixaban (Eliquis)     Type of Anesthesia:   LIDOCAINE   Additional requests/questions:    [name]   01/25/2022, 6:29 PM

## 2022-01-26 ENCOUNTER — Ambulatory Visit (INDEPENDENT_AMBULATORY_CARE_PROVIDER_SITE_OTHER): Payer: Medicare Other | Admitting: Physician Assistant

## 2022-01-26 ENCOUNTER — Encounter: Payer: Self-pay | Admitting: Physician Assistant

## 2022-01-26 ENCOUNTER — Other Ambulatory Visit: Payer: Self-pay

## 2022-01-26 ENCOUNTER — Ambulatory Visit (HOSPITAL_COMMUNITY)
Admission: RE | Admit: 2022-01-26 | Discharge: 2022-01-26 | Disposition: A | Discharge status: Home / Self Care | Payer: Medicare Other | Source: Ambulatory Visit | Attending: Vascular Surgery | Admitting: Vascular Surgery

## 2022-01-26 VITALS — BP 175/95 | HR 64 | Temp 97.2°F | Resp 20 | Ht 64.0 in | Wt 116.5 lb

## 2022-01-26 DIAGNOSIS — K551 Chronic vascular disorders of intestine: Secondary | ICD-10-CM | POA: Diagnosis not present

## 2022-01-27 NOTE — Telephone Encounter (Signed)
Primary Cardiologist:James Hochrein, MD ? ?Chart reviewed as part of pre-operative protocol coverage. Because of Malena Magro's past medical history and time since last visit, he/she will require a follow-up visit in order to better assess preoperative cardiovascular risk. ? ?Pre-op covering staff: ?- Please schedule appointment and call patient to inform them. ?- Please contact requesting surgeon's office via preferred method (i.e, phone, fax) to inform them of need for appointment prior to surgery. ? ?If applicable, this message will also be routed to pharmacy pool and/or primary cardiologist for input on holding anticoagulant/antiplatelet agent as requested below so that this information is available at time of patient's appointment.  ? ?Deberah Pelton, NP  ?01/27/2022, 1:46 PM  ? ?

## 2022-01-27 NOTE — Telephone Encounter (Signed)
Patient with diagnosis of A Fib on Eliquis for anticoagulation.   ? ?Procedure: TONGUE Bx ?Date of procedure: TBD ? ? ?CHA2DS2-VASc Score = 7  ?This indicates a 11.2% annual risk of stroke. ?The patient's score is based upon: ?CHF History: 1 ?HTN History: 1 ?Diabetes History: 1 ?Stroke History: 0 ?Vascular Disease History: 1 ?Age Score: 2 ?Gender Score: 1 ? ? ?CrCl 31 mL/min ?Platelet count Overdue ? ?Per office protocol, patient can hold Eliquis for 1 days prior to procedure.   ? ?

## 2022-01-28 NOTE — Telephone Encounter (Signed)
Pt has been scheduled to see Diona Browner, NP, 02/07/22, clearance will be addressed at that time. ? ?Will route back to the requesting surgeon's office to make them aware. ?

## 2022-01-31 NOTE — Telephone Encounter (Signed)
Per pre op provider and Nicholes Rough, [place] ok to move appt up to see Nicholes Rough, [facility] 3/10 at 2:15 in the provider use slot.  ? ?

## 2022-01-31 NOTE — Telephone Encounter (Signed)
? ?  Pt said she is scheduled for surgery on 02/08/22, she is asking if she can get an appt this week ?

## 2022-02-04 ENCOUNTER — Encounter: Payer: Self-pay | Admitting: Physician Assistant

## 2022-02-04 ENCOUNTER — Other Ambulatory Visit: Payer: Self-pay

## 2022-02-04 ENCOUNTER — Ambulatory Visit (INDEPENDENT_AMBULATORY_CARE_PROVIDER_SITE_OTHER): Payer: Medicare Other | Admitting: Physician Assistant

## 2022-02-04 VITALS — BP 142/84 | HR 56 | Ht 64.0 in | Wt 114.0 lb

## 2022-02-04 DIAGNOSIS — I10 Essential (primary) hypertension: Secondary | ICD-10-CM

## 2022-02-04 DIAGNOSIS — I480 Paroxysmal atrial fibrillation: Secondary | ICD-10-CM | POA: Diagnosis not present

## 2022-02-04 DIAGNOSIS — I2510 Atherosclerotic heart disease of native coronary artery without angina pectoris: Secondary | ICD-10-CM | POA: Diagnosis not present

## 2022-02-04 DIAGNOSIS — R011 Cardiac murmur, unspecified: Secondary | ICD-10-CM | POA: Diagnosis not present

## 2022-02-04 DIAGNOSIS — I272 Pulmonary hypertension, unspecified: Secondary | ICD-10-CM | POA: Diagnosis not present

## 2022-02-04 NOTE — Patient Instructions (Signed)
Medication Instructions:  ?Your physician recommends that you continue on your current medications as directed. Please refer to the Current Medication list given to you today. ?1.You may hold your Eliquis for 1 day prior to your procedure  ?*If you need a refill on your cardiac medications before your next appointment, please call your pharmacy* ? ? ?Lab Work: ?None ?If you have labs (blood work) drawn today and your tests are completely normal, you will receive your results only by: ?MyChart Message (if you have MyChart) OR ?A paper copy in the mail ?If you have any lab test that is abnormal or we need to change your treatment, we will call you to review the results. ? ?Follow-Up: ?At [facility], you and your health needs are our priority.  As part of our continuing mission to provide you with exceptional heart care, we have created designated Provider Care Teams.  These Care Teams include your primary Cardiologist (physician) and Advanced Practice Providers (APPs -  Physician Assistants and Nurse Practitioners) who all work together to provide you with the care you need, when you need it. ? ?We recommend signing up for the patient portal called "MyChart".  Sign up information is provided on this After Visit Summary.  MyChart is used to connect with patients for Virtual Visits (Telemedicine).  Patients are able to view lab/test results, encounter notes, upcoming appointments, etc.  Non-urgent messages can be sent to your provider as well.   ?To learn more about what you can do with MyChart, go to [email or web address].   ? ?Your next appointment:   ?6 month(s) ? ?The format for your next appointment:   ?In Person ? ?Provider:   ?Minus Breeding, MD  ?

## 2022-02-04 NOTE — Progress Notes (Signed)
Office Visit    Patient Name: Heather Olsen Date of Encounter: 02/04/2022  PCP:  Deland Pretty, [place]  Cardiologist:  Minus Breeding, MD  Advanced Practice Provider:  No care team member to display Electrophysiologist:  None    Chief Complaint    Heather Olsen is a 88 y.o. female with a hx of CAD with stent placement in 2008 to his LAD (follow-up cardiac cath 2009 with 30% narrowing of the stent but no other disease), tricuspid regurgitation, mitral valve regurgitation, tachybradycardia palpitations, depression, hypertension, hyperlipidemia, and aortic stenosis presents today for pre-op clearance.   The patient had a stress perfusion study in 2014 which was negative.  Lexiscan Myoview was also negative in 2017.  She had had dizziness and was found to have short runs of atrial fibrillation.  Beta-blocker was switched to pindolol.  She has some shortness of breath and echocardiogram demonstrated LV function with moderately elevated pulmonary pressures.  BNP was mildly elevated.  She was last seen in May 2022 and at that time she was doing well.  She denied any new cardiac symptoms.  The palpitations she was having before have not particularly been problematic for her.  She had not felt any atrial fibrillation.  Today, she presents for preop clearance for a tongue biopsy.  Pharmacy has recommended holding Eliquis 1 day prior to the procedure.  She states that there were white spots found on her tongue and may need biopsied.  Local lidocaine for anesthetic will be used.  She has not had any heart related issues since she was last seen in the office May 2022.  She remains physically active and enjoys spending her time at [facility] and reading.  She has not had any further issues with her atrial fibrillation and her blood pressure is controlled today.  She is able to get around without any assistance and meets the required 4 METS for cardiac  clearance.  Reports no shortness of breath nor dyspnea on exertion. Reports no chest pain, pressure, or tightness. No edema, orthopnea, PND. Reports no palpitations.    Past Medical History    Past Medical History:  Diagnosis Date   Aortic stenosis 04/12/2016   none noted on echo done 08/13/20   Coronary artery disease    Depression    Diverticulosis    Hearing loss 04/12/2016   Hiatal hernia 04/12/2016   HLD (hyperlipidemia) 04/12/2016   Hyperglycemia 09/14/2016   Hypertension    Idiopathic scoliosis 04/12/2016   Major depression 04/12/2016   Mitral regurgitation    Osteoporosis, senile 04/12/2016   Pernicious anemia    Raynaud's disease 04/12/2016   Scoliosis    Spinal stenosis of lumbar region 04/12/2016   Past Surgical History:  Procedure Laterality Date   APPENDECTOMY     BREAST SURGERY     broken wrist Right 2007   CARPAL TUNNEL RELEASE Left 04/04/2017   Procedure: LEFT CARPAL TUNNEL RELEASE;  Surgeon: Daryll Brod, MD;  Location: [place];  Service: Orthopedics;  Laterality: Left;  REG/FAB   CARPAL TUNNEL RELEASE Right 12/01/2020   Procedure: CARPAL TUNNEL RELEASE;  Surgeon: Daryll Brod, MD;  Location: [place];  Service: Orthopedics;  Laterality: Right;  IV REGIONAL FOREARM BLOCK   CATARACT EXTRACTION W/ INTRAOCULAR LENS  IMPLANT, BILATERAL Bilateral 2017   COLONOSCOPY  2016   FOOT SURGERY Right    PERIPHERAL VASCULAR INTERVENTION  11/11/2019   Procedure: PERIPHERAL VASCULAR INTERVENTION;  Surgeon: Waynetta Sandy, MD;  Location: [place] [facility];  Service: Cardiovascular;;   TONSILLECTOMY     VISCERAL ANGIOGRAPHY N/A 11/11/2019   Procedure: VISCERAL ANGIOGRAPHY;  Surgeon: Waynetta Sandy, MD;  Location: [place] [facility];  Service: Cardiovascular;  Laterality: N/A;    Allergies  Allergies  Allergen Reactions   Topiramate Rash   Ace Inhibitors Cough   Codeine Nausea Only   Ibandronic Acid Nausea Only   Meloxicam      Other reaction(s): GI upset, GI Upset (intolerance), Other (See Comments) unknown    Prevacid [Lansoprazole]     Chest pain   Sulfa Antibiotics Other (See Comments)    headaches   Verapamil Other (See Comments)    constipation   Zolpidem Other (See Comments)    hallucinations     EKGs/Labs/Other Studies Reviewed:   The following studies were reviewed today:  Echocardiogram 08/13/20  IMPRESSIONS     1. Left ventricular ejection fraction, by estimation, is 60 to 65%. The  left ventricle has normal function. The left ventricle has no regional  wall motion abnormalities. Left ventricular diastolic parameters are  consistent with Grade II diastolic  dysfunction (pseudonormalization).   2. Right ventricular systolic function is normal. The right ventricular  size is normal. There is moderately elevated pulmonary artery systolic  pressure. The estimated right ventricular systolic pressure is 19.4 mmHg.   3. Left atrial size was mild to moderately dilated.   4. Right atrial size was moderately dilated.   5. The mitral valve is normal in structure. Mild mitral valve  regurgitation. No evidence of mitral stenosis.   6. Tricuspid valve regurgitation is mild to moderate.   7. The aortic valve is tricuspid. Aortic valve regurgitation is not  visualized. No aortic stenosis is present.   8. Aortic dilatation noted. There is mild dilatation of the ascending  aorta, measuring 37 mm.   9. The inferior vena cava is normal in size with greater than 50%  respiratory variability, suggesting right atrial pressure of 3 mmHg.   FINDINGS   Left Ventricle: Left ventricular ejection fraction, by estimation, is 60  to 65%. The left ventricle has normal function. The left ventricle has no  regional wall motion abnormalities. The left ventricular internal cavity  size was normal in size. There is   no left ventricular hypertrophy. Left ventricular diastolic parameters  are consistent with Grade  II diastolic dysfunction (pseudonormalization).   Right Ventricle: The right ventricular size is normal. No increase in  right ventricular wall thickness. Right ventricular systolic function is  normal. There is moderately elevated pulmonary artery systolic pressure.  The tricuspid regurgitant velocity is  3.27 m/s, and with an assumed right atrial pressure of 3 mmHg, the  estimated right ventricular systolic pressure is 17.4 mmHg.   EKG:  EKG is  ordered today.  The ekg ordered today demonstrates sinus bradycardia, rate 56 bpm. Asymptomatic.   Recent Labs: No results found for requested labs within last 8760 hours.  Recent Lipid Panel    Component Value Date/Time   CHOL 232 (H) 09/14/2016 1415   TRIG 168 (H) 09/14/2016 1415   HDL 99 09/14/2016 1415   CHOLHDL 2.3 09/14/2016 1415   VLDL 34 (H) 09/14/2016 1415   LDLCALC 99 09/14/2016 1415    Home Medications   Current Meds  Medication Sig   acetaminophen (TYLENOL) 650 MG CR tablet 2 tablets   celecoxib (CELEBREX) 200 MG capsule TAKE 1 CAPSULE BY MOUTH EVERY DAY  Cholecalciferol (VITAMIN D3) 2000 units capsule Take 2,000 Units by mouth daily.    denosumab (PROLIA) 60 MG/ML SOSY injection 60 mg   ELIQUIS 2.5 MG TABS tablet Take 2.5 mg by mouth 2 (two) times daily. Pt taking 2 mg twice daily   escitalopram (LEXAPRO) 10 MG tablet Take 15 mg by mouth daily. Pt taking 1 1/4 tablet daily   furosemide (LASIX) 20 MG tablet Take 1 tablet (20 mg total) by mouth daily.   gabapentin (NEURONTIN) 300 MG capsule Take 300 mg by mouth at bedtime.   ipratropium (ATROVENT) 0.06 % nasal spray Use 2 sprays in each nostril up to three times daily before meals for sinus drainage. (Patient taking differently: Place 2 sprays into the nose daily.)   Lactobacillus-Inulin (CULTURELLE DIGESTIVE HEALTH PO) Take 1 capsule by mouth daily. Take one daily for probiotic   metFORMIN (GLUCOPHAGE-XR) 500 MG 24 hr tablet Take 1 tablet by mouth daily.   mirabegron ER  (MYRBETRIQ) 50 MG TB24 tablet Take 1 tablet by mouth daily.   MULTIPLE VITAMINS-MINERALS PO Take 1 tablet by mouth daily.    nitroGLYCERIN (NITROSTAT) 0.4 MG SL tablet Place 1 tablet (0.4 mg total) under the tongue every 5 (five) minutes as needed for chest pain. Place one tablet under the tongue every 5 minutes as needed for chest pain. No more than 3   pantoprazole (PROTONIX) 40 MG tablet Take 40 mg by mouth daily.   pindolol (VISKEN) 5 MG tablet Take 1/2 (one-half) tablet by mouth twice daily   Probiotic Product (PROBIOTIC DAILY PO) Take by mouth.   rOPINIRole (REQUIP) 0.25 MG tablet Take 0.125 mg by mouth daily.    rosuvastatin (CRESTOR) 10 MG tablet TAKE 1 TABLET BY MOUTH AT BEDTIME   traMADol (ULTRAM) 50 MG tablet TAKE 1 TABLET BY MOUTH EVERY 4 HOURS (Patient taking differently: Take 50 mg by mouth every 4 (four) hours as needed for moderate pain or severe pain (pain).)   trimethoprim (TRIMPEX) 100 MG tablet Take 100 mg by mouth daily.   vitamin B-12 (CYANOCOBALAMIN) 1000 MCG tablet Take 1,000 mcg by mouth daily.     Review of Systems      All other systems reviewed and are otherwise negative except as noted above.  Physical Exam    VS:  BP (!) 142/84    Pulse (!) 56    Ht '5\' 4"'$  (1.626 m)    Wt 114 lb (51.7 kg)    SpO2 98%    BMI 19.57 kg/m  , BMI Body mass index is 19.57 kg/m.  Wt Readings from Last 3 Encounters:  02/04/22 114 lb (51.7 kg)  01/26/22 116 lb 8 oz (52.8 kg)  09/28/21 115 lb (52.2 kg)     GEN: Well nourished, well developed, in no acute distress. HEENT: normal. Neck: Supple, no JVD, carotid bruits, or masses. Cardiac: sinus bradycardia, no murmurs, rubs, or gallops. No clubbing, cyanosis, edema.  Radials/PT 2+ and equal bilaterally.  Respiratory:  Respirations regular and unlabored, clear to auscultation bilaterally. GI: Soft, nontender, nondistended. MS: No deformity or atrophy. Skin: Warm and dry, no rash. Neuro:  Strength and sensation are intact. Psych:  Normal affect.  Assessment & Plan    Preop Clearance  Click Here to Calculate RCRI      :[id number]}   Heather Olsen's perioperative risk of a major cardiac event is 0.9% according to the Revised Cardiac Risk Index (RCRI).  Therefore, she is at low risk for perioperative complications.   Her functional  capacity is good at 4.64 METs according to the Duke Activity Status Index (DASI). Recommendations: According to ACC/AHA guidelines, no further cardiovascular testing needed.  The patient may proceed to surgery at acceptable risk.   Antiplatelet and/or Anticoagulation Recommendations: Hold Eliquis x 24 hours prior to procedure and resume when safe to do so.   2.  CAD -No chest pain  3. Atrial fibrillation -She feels it from time to time -It does not prevent her from doing what she wants to do -Has been well controlled over the years on the Pindolol  4. Aortic stenosis -None on recent echo 08/13/2020 -Asymptomatic  5. Hypertension -A little elevated today but patient states its usually lower at home  6. Pulmonary hypertension -Asymptomatic  -No change in therapy     Disposition: Follow up 6 months with Minus Breeding, MD or APP.  Signed, Elgie Collard, PA-C 02/04/2022, 3:06 PM [facility]

## 2022-02-07 ENCOUNTER — Ambulatory Visit: Payer: Medicare Other | Admitting: Nurse Practitioner

## 2022-02-08 DIAGNOSIS — K1329 Other disturbances of oral epithelium, including tongue: Secondary | ICD-10-CM | POA: Diagnosis not present

## 2022-02-09 ENCOUNTER — Ambulatory Visit: Payer: Medicare Other | Admitting: Physician Assistant

## 2022-02-21 DIAGNOSIS — N1831 Chronic kidney disease, stage 3a: Secondary | ICD-10-CM | POA: Diagnosis not present

## 2022-02-21 DIAGNOSIS — R7303 Prediabetes: Secondary | ICD-10-CM | POA: Diagnosis not present

## 2022-02-21 DIAGNOSIS — E782 Mixed hyperlipidemia: Secondary | ICD-10-CM | POA: Diagnosis not present

## 2022-02-21 DIAGNOSIS — I10 Essential (primary) hypertension: Secondary | ICD-10-CM | POA: Diagnosis not present

## 2022-02-21 DIAGNOSIS — D519 Vitamin B12 deficiency anemia, unspecified: Secondary | ICD-10-CM | POA: Diagnosis not present

## 2022-02-24 DIAGNOSIS — K219 Gastro-esophageal reflux disease without esophagitis: Secondary | ICD-10-CM | POA: Diagnosis not present

## 2022-02-24 DIAGNOSIS — Z0000 Encounter for general adult medical examination without abnormal findings: Secondary | ICD-10-CM | POA: Diagnosis not present

## 2022-02-24 DIAGNOSIS — F3341 Major depressive disorder, recurrent, in partial remission: Secondary | ICD-10-CM | POA: Diagnosis not present

## 2022-02-24 DIAGNOSIS — K5900 Constipation, unspecified: Secondary | ICD-10-CM | POA: Diagnosis not present

## 2022-02-24 DIAGNOSIS — E118 Type 2 diabetes mellitus with unspecified complications: Secondary | ICD-10-CM | POA: Diagnosis not present

## 2022-02-24 DIAGNOSIS — R413 Other amnesia: Secondary | ICD-10-CM | POA: Diagnosis not present

## 2022-02-24 DIAGNOSIS — I10 Essential (primary) hypertension: Secondary | ICD-10-CM | POA: Diagnosis not present

## 2022-02-24 DIAGNOSIS — D6859 Other primary thrombophilia: Secondary | ICD-10-CM | POA: Diagnosis not present

## 2022-02-24 DIAGNOSIS — M810 Age-related osteoporosis without current pathological fracture: Secondary | ICD-10-CM | POA: Diagnosis not present

## 2022-02-24 DIAGNOSIS — I4891 Unspecified atrial fibrillation: Secondary | ICD-10-CM | POA: Diagnosis not present

## 2022-02-24 DIAGNOSIS — I2510 Atherosclerotic heart disease of native coronary artery without angina pectoris: Secondary | ICD-10-CM | POA: Diagnosis not present

## 2022-02-24 DIAGNOSIS — N1832 Chronic kidney disease, stage 3b: Secondary | ICD-10-CM | POA: Diagnosis not present

## 2022-02-25 ENCOUNTER — Ambulatory Visit (INDEPENDENT_AMBULATORY_CARE_PROVIDER_SITE_OTHER): Payer: Medicare Other | Admitting: Family Medicine

## 2022-02-25 ENCOUNTER — Ambulatory Visit (INDEPENDENT_AMBULATORY_CARE_PROVIDER_SITE_OTHER): Payer: Medicare Other

## 2022-02-25 VITALS — BP 98/72 | HR 52 | Ht 64.0 in | Wt 113.4 lb

## 2022-02-25 DIAGNOSIS — M7742 Metatarsalgia, left foot: Secondary | ICD-10-CM | POA: Diagnosis not present

## 2022-02-25 DIAGNOSIS — I10 Essential (primary) hypertension: Secondary | ICD-10-CM | POA: Diagnosis not present

## 2022-02-25 DIAGNOSIS — I2510 Atherosclerotic heart disease of native coronary artery without angina pectoris: Secondary | ICD-10-CM

## 2022-02-25 DIAGNOSIS — E7800 Pure hypercholesterolemia, unspecified: Secondary | ICD-10-CM | POA: Diagnosis not present

## 2022-02-25 DIAGNOSIS — M79672 Pain in left foot: Secondary | ICD-10-CM

## 2022-02-25 DIAGNOSIS — E119 Type 2 diabetes mellitus without complications: Secondary | ICD-10-CM | POA: Diagnosis not present

## 2022-02-25 NOTE — Progress Notes (Signed)
? ?  I, Peterson Lombard, LAT, ATC acting as a scribe for Lynne Leader, MD. ? ?Subjective:   ? ?CC: L foot pain ? ?HPI: Pt is an 88 y/o female c/o L foot pain that has gradually been worsening over the last couple month. Pt locates pain to the heads of the 1st-2nd MT. Pt notes she has a history of arthritis and back issue. ? ?L foot swelling: no ?Aggravates: walking ?Treatments tried: Tylenol, Tramadol ? ?Pertinent review of Systems: No fevers or chills ? ?Relevant historical information: Raynoud's disease.  Heart disease.  Diabetes. ? ? ?Objective:   ? ?Vitals:  ? 02/25/22 1054  ?BP: 98/72  ?Pulse: (!) 52  ?SpO2: (!) 82%  ?Of note patient's hands were cold during oxygen saturation.  Suspect instrumentation air.  She did not appear hypoxic ? ?General: Well Developed, well nourished, and in no acute distress.  ? ?MSK: Left foot pes planus and significant collapse of the transverse arch.  She has callus formation at metatarsal heads 2 3 and 4 and tenderness to palpation at these metatarsal heads. ?Capillary fill and sensation is intact distally. ? ? ?Patient was fitted with a metatarsal pad.  She felt that this was comfortable and perhaps helped her pain some prior to discharge. ? ?Lab and Radiology Results ? ?X-ray images left foot obtained today personally and independently interpreted ?No acute fractures.  Enthesiopathy changes at plantar calcaneus and at cuboid. ?Await formal radiology review ? ? ? ?Impression and Recommendations:   ? ?Assessment and Plan: ?88 y.o. female with significant metatarsalgia.  We will try treating with metatarsal pads.  1 metatarsal pad may not be sufficient.  Plan to check back in 2 or 3 weeks.  Also recommend Voltaren gel.. ? ?PDMP not reviewed this encounter. ?Orders Placed This Encounter  ?Procedures  ? DG Foot Complete Left  ?  Standing Status:   Future  ?  Number of Occurrences:   1  ?  Standing Expiration Date:   02/26/2023  ?  Order Specific Question:   Reason for Exam (SYMPTOM   OR DIAGNOSIS REQUIRED)  ?  Answer:   left foot pain  ?  Order Specific Question:   Preferred imaging location?  ?  Answer:   [facility]  ? ?No orders of the defined types were placed in this encounter. ? ? ?Discussed warning signs or symptoms. Please see discharge instructions. Patient expresses understanding. ? ? ?The above documentation has been reviewed and is accurate and complete Lynne Leader, M.D. ? ?

## 2022-02-25 NOTE — Patient Instructions (Addendum)
Thank you for coming in today.  ? ?Please get an Xray today before you leave  ? ?Please use Voltaren gel (Generic Diclofenac Gel) up to 4x daily for pain as needed.  This is available over-the-counter as both the name brand Voltaren gel and the generic diclofenac gel.  ? ?Recheck back in 6 weeks ?

## 2022-02-28 NOTE — Progress Notes (Signed)
Left foot x-ray shows mild arthritis changes

## 2022-03-03 DIAGNOSIS — M810 Age-related osteoporosis without current pathological fracture: Secondary | ICD-10-CM | POA: Diagnosis not present

## 2022-03-08 DIAGNOSIS — N3020 Other chronic cystitis without hematuria: Secondary | ICD-10-CM | POA: Diagnosis not present

## 2022-03-08 DIAGNOSIS — N3946 Mixed incontinence: Secondary | ICD-10-CM | POA: Diagnosis not present

## 2022-03-08 DIAGNOSIS — R351 Nocturia: Secondary | ICD-10-CM | POA: Diagnosis not present

## 2022-03-22 ENCOUNTER — Encounter: Payer: Self-pay | Admitting: Family Medicine

## 2022-03-22 ENCOUNTER — Ambulatory Visit (INDEPENDENT_AMBULATORY_CARE_PROVIDER_SITE_OTHER): Payer: Medicare Other | Admitting: Family Medicine

## 2022-03-22 VITALS — BP 112/70 | HR 54 | Ht 64.0 in | Wt 114.4 lb

## 2022-03-22 DIAGNOSIS — M79672 Pain in left foot: Secondary | ICD-10-CM

## 2022-03-22 DIAGNOSIS — I2510 Atherosclerotic heart disease of native coronary artery without angina pectoris: Secondary | ICD-10-CM | POA: Diagnosis not present

## 2022-03-22 DIAGNOSIS — M25872 Other specified joint disorders, left ankle and foot: Secondary | ICD-10-CM | POA: Diagnosis not present

## 2022-03-22 DIAGNOSIS — M7742 Metatarsalgia, left foot: Secondary | ICD-10-CM | POA: Diagnosis not present

## 2022-03-22 DIAGNOSIS — G2581 Restless legs syndrome: Secondary | ICD-10-CM

## 2022-03-22 NOTE — Progress Notes (Signed)
? ?  I, Wendy Poet, LAT, ATC, am serving as scribe for Dr. Lynne Leader. ? ?Heather Olsen is a 88 y.o. female who presents to [place] at [facility] today for f/u L foot pain due to significant metatarsalgia. Pt was last seen by Dr. Georgina Snell on 02/25/22 and was advised to use a metatarsal pad and Voltaren gel. Today, pt reports that her L foot is better than before.  She feels like the MT pads are helping.  She notes that she is having some pain in her L ankle at the medial and lateral malleolus.  She is also using the Voltaren gel as advised. ? ?She also reports issues w/ restless leg syndrome.  Dr. Shelia Media placed her on repinerole 0.'125mg'$  qhs.   ? ?Dx imaging: 02/25/22 L foot XR ? ?Pertinent review of systems: No fevers or chills ? ?Relevant historical information: Hypertension.  Heart failure.  Diabetes.  Osteoporosis.  Restless leg syndrome. ? ? ?Exam:  ?BP 112/70 (BP Location: Right Arm, Patient Position: Sitting, Cuff Size: Normal)   Pulse (!) 54   Ht '5\' 4"'$  (1.626 m)   Wt 114 lb 6.4 oz (51.9 kg)   SpO2 95%   BMI 19.64 kg/m?  ?General: Well Developed, well nourished, and in no acute distress.  ? ?MSK: Left foot long thin foot with significant ankle pronation and pes planus. ?Lateral ankle impingement with standing.  Not particularly tender to palpation. ? ? ? ?Lab and Radiology Results ?EXAM: ?LEFT FOOT - COMPLETE 3+ VIEW ?  ?COMPARISON:  None. ?  ?FINDINGS: ?There is no evidence of fracture or dislocation. There is mild ?degenerative narrowing at the first metatarsophalangeal joint. There ?also mild degenerative changes at the fifth carpometacarpal joint. ?Nonspecific soft tissue calcifications are seen near the posterior ?and plantar calcaneus. Soft tissues are otherwise within normal ?limits. ?  ?IMPRESSION: ?1. No acute bony abnormality. ?2. Mild degenerative changes. ?  ?  ?Electronically Signed ?  By: Ronney Asters M.D. ?  On: 02/27/2022 20:08 ?I, Lynne Leader, personally (independently)  visualized and performed the interpretation of the images attached in this note. ? ? ? ? ? ?Assessment and Plan: ?88 y.o. female with left foot pain.  She is done pretty well with metatarsal pads for metatarsalgia.  She is now having a little bit of lateral ankle pain that I am pretty sure is due to lateral ankle impingement due to her significant ankle and foot pronation.  Plan for scaphoid pad to bring her foot into more normal alignment which should help reduce the lateral ankle impingement. ? ?Additionally we talked about restless leg syndrome.  She is on one half of the starting dose of Requip which has historically worked pretty well for her.  I think it makes sense to try increasing the dose to 0.25 mg daily which would be a full pill.  The maximum dose of Requip for restless leg syndrome is 4 mg.  She certainly has room to grow. ?Recommend seeing how that does and report back to myself or Dr. Shelia Media. ? ? ? ? ?Discussed warning signs or symptoms. Please see discharge instructions. Patient expresses understanding. ? ? ?The above documentation has been reviewed and is accurate and complete Lynne Leader, M.D. ? ? ?

## 2022-03-22 NOTE — Patient Instructions (Addendum)
Good to see you today. ? ?Increase your Requip to a full pill. ? ?Size Small scaphoid pads from [email or web address] ? ?Follow-up: as needed ?

## 2022-03-29 DIAGNOSIS — N3020 Other chronic cystitis without hematuria: Secondary | ICD-10-CM | POA: Diagnosis not present

## 2022-03-29 DIAGNOSIS — G2581 Restless legs syndrome: Secondary | ICD-10-CM | POA: Diagnosis not present

## 2022-03-29 DIAGNOSIS — F419 Anxiety disorder, unspecified: Secondary | ICD-10-CM | POA: Diagnosis not present

## 2022-03-29 DIAGNOSIS — K5900 Constipation, unspecified: Secondary | ICD-10-CM | POA: Diagnosis not present

## 2022-04-07 ENCOUNTER — Ambulatory Visit: Payer: Medicare Other | Admitting: Family Medicine

## 2022-04-12 ENCOUNTER — Other Ambulatory Visit: Payer: Self-pay | Admitting: Cardiology

## 2022-04-12 DIAGNOSIS — R600 Localized edema: Secondary | ICD-10-CM | POA: Diagnosis not present

## 2022-04-12 DIAGNOSIS — G2581 Restless legs syndrome: Secondary | ICD-10-CM | POA: Diagnosis not present

## 2022-04-12 DIAGNOSIS — F419 Anxiety disorder, unspecified: Secondary | ICD-10-CM | POA: Diagnosis not present

## 2022-04-14 DIAGNOSIS — M4120 Other idiopathic scoliosis, site unspecified: Secondary | ICD-10-CM | POA: Diagnosis not present

## 2022-04-14 DIAGNOSIS — M545 Low back pain, unspecified: Secondary | ICD-10-CM | POA: Diagnosis not present

## 2022-05-11 DIAGNOSIS — Z23 Encounter for immunization: Secondary | ICD-10-CM | POA: Diagnosis not present

## 2022-05-12 DIAGNOSIS — M810 Age-related osteoporosis without current pathological fracture: Secondary | ICD-10-CM | POA: Diagnosis not present

## 2022-05-12 DIAGNOSIS — Z79899 Other long term (current) drug therapy: Secondary | ICD-10-CM | POA: Diagnosis not present

## 2022-05-12 DIAGNOSIS — N1832 Chronic kidney disease, stage 3b: Secondary | ICD-10-CM | POA: Diagnosis not present

## 2022-05-12 DIAGNOSIS — E782 Mixed hyperlipidemia: Secondary | ICD-10-CM | POA: Diagnosis not present

## 2022-05-12 DIAGNOSIS — E1122 Type 2 diabetes mellitus with diabetic chronic kidney disease: Secondary | ICD-10-CM | POA: Diagnosis not present

## 2022-05-18 DIAGNOSIS — E538 Deficiency of other specified B group vitamins: Secondary | ICD-10-CM | POA: Diagnosis not present

## 2022-05-18 DIAGNOSIS — G63 Polyneuropathy in diseases classified elsewhere: Secondary | ICD-10-CM | POA: Diagnosis not present

## 2022-06-02 DIAGNOSIS — R1319 Other dysphagia: Secondary | ICD-10-CM | POA: Diagnosis not present

## 2022-06-02 DIAGNOSIS — K2100 Gastro-esophageal reflux disease with esophagitis, without bleeding: Secondary | ICD-10-CM | POA: Diagnosis not present

## 2022-06-02 DIAGNOSIS — K5900 Constipation, unspecified: Secondary | ICD-10-CM | POA: Diagnosis not present

## 2022-06-06 DIAGNOSIS — M4120 Other idiopathic scoliosis, site unspecified: Secondary | ICD-10-CM | POA: Diagnosis not present

## 2022-06-06 DIAGNOSIS — M25561 Pain in right knee: Secondary | ICD-10-CM | POA: Diagnosis not present

## 2022-06-09 DIAGNOSIS — L308 Other specified dermatitis: Secondary | ICD-10-CM | POA: Diagnosis not present

## 2022-06-15 DIAGNOSIS — N3946 Mixed incontinence: Secondary | ICD-10-CM | POA: Diagnosis not present

## 2022-06-15 DIAGNOSIS — N3020 Other chronic cystitis without hematuria: Secondary | ICD-10-CM | POA: Diagnosis not present

## 2022-06-30 DIAGNOSIS — D539 Nutritional anemia, unspecified: Secondary | ICD-10-CM | POA: Diagnosis not present

## 2022-06-30 DIAGNOSIS — R6883 Chills (without fever): Secondary | ICD-10-CM | POA: Diagnosis not present

## 2022-06-30 DIAGNOSIS — F3341 Major depressive disorder, recurrent, in partial remission: Secondary | ICD-10-CM | POA: Diagnosis not present

## 2022-07-04 ENCOUNTER — Telehealth: Payer: Self-pay | Admitting: Oncology

## 2022-07-04 NOTE — Telephone Encounter (Signed)
Scheduled appt per 8/4 referral. Pt is aware of appt date and time. Pt is aware to arrive 15 mins prior to appt time and to bring and updated insurance card. Pt is aware of appt location.

## 2022-07-05 ENCOUNTER — Other Ambulatory Visit: Payer: Self-pay | Admitting: Cardiology

## 2022-07-11 ENCOUNTER — Emergency Department (HOSPITAL_BASED_OUTPATIENT_CLINIC_OR_DEPARTMENT_OTHER): Payer: Medicare Other

## 2022-07-11 ENCOUNTER — Encounter (HOSPITAL_BASED_OUTPATIENT_CLINIC_OR_DEPARTMENT_OTHER): Payer: Self-pay | Admitting: Emergency Medicine

## 2022-07-11 ENCOUNTER — Emergency Department (HOSPITAL_BASED_OUTPATIENT_CLINIC_OR_DEPARTMENT_OTHER)
Admission: EM | Admit: 2022-07-11 | Discharge: 2022-07-11 | Disposition: A | Discharge status: Home / Self Care | Payer: Medicare Other | Attending: Emergency Medicine | Admitting: Emergency Medicine

## 2022-07-11 ENCOUNTER — Other Ambulatory Visit: Payer: Self-pay

## 2022-07-11 DIAGNOSIS — Z7984 Long term (current) use of oral hypoglycemic drugs: Secondary | ICD-10-CM | POA: Diagnosis not present

## 2022-07-11 DIAGNOSIS — Z7901 Long term (current) use of anticoagulants: Secondary | ICD-10-CM | POA: Insufficient documentation

## 2022-07-11 DIAGNOSIS — K5730 Diverticulosis of large intestine without perforation or abscess without bleeding: Secondary | ICD-10-CM | POA: Diagnosis not present

## 2022-07-11 DIAGNOSIS — R112 Nausea with vomiting, unspecified: Secondary | ICD-10-CM | POA: Diagnosis not present

## 2022-07-11 DIAGNOSIS — R0602 Shortness of breath: Secondary | ICD-10-CM | POA: Diagnosis not present

## 2022-07-11 DIAGNOSIS — R1031 Right lower quadrant pain: Secondary | ICD-10-CM | POA: Diagnosis not present

## 2022-07-11 DIAGNOSIS — N134 Hydroureter: Secondary | ICD-10-CM | POA: Diagnosis not present

## 2022-07-11 DIAGNOSIS — Z8719 Personal history of other diseases of the digestive system: Secondary | ICD-10-CM | POA: Diagnosis not present

## 2022-07-11 DIAGNOSIS — R110 Nausea: Secondary | ICD-10-CM | POA: Diagnosis not present

## 2022-07-11 LAB — CBC WITH DIFFERENTIAL/PLATELET
Abs Immature Granulocytes: 0.03 10*3/uL (ref 0.00–0.07)
Basophils Absolute: 0.1 10*3/uL (ref 0.0–0.1)
Basophils Relative: 1 %
Eosinophils Absolute: 0.7 10*3/uL — ABNORMAL HIGH (ref 0.0–0.5)
Eosinophils Relative: 7 %
HCT: 32.6 % — ABNORMAL LOW (ref 36.0–46.0)
Hemoglobin: 11.1 g/dL — ABNORMAL LOW (ref 12.0–15.0)
Immature Granulocytes: 0 %
Lymphocytes Relative: 12 %
Lymphs Abs: 1.1 10*3/uL (ref 0.7–4.0)
MCH: 35.1 pg — ABNORMAL HIGH (ref 26.0–34.0)
MCHC: 34 g/dL (ref 30.0–36.0)
MCV: 103.2 fL — ABNORMAL HIGH (ref 80.0–100.0)
Monocytes Absolute: 1 10*3/uL (ref 0.1–1.0)
Monocytes Relative: 11 %
Neutro Abs: 6.3 10*3/uL (ref 1.7–7.7)
Neutrophils Relative %: 69 %
Platelets: 389 10*3/uL (ref 150–400)
RBC: 3.16 MIL/uL — ABNORMAL LOW (ref 3.87–5.11)
RDW: 11.9 % (ref 11.5–15.5)
WBC: 9.3 10*3/uL (ref 4.0–10.5)
nRBC: 0 % (ref 0.0–0.2)

## 2022-07-11 LAB — COMPREHENSIVE METABOLIC PANEL
ALT: 12 U/L (ref 0–44)
AST: 23 U/L (ref 15–41)
Albumin: 3.9 g/dL (ref 3.5–5.0)
Alkaline Phosphatase: 77 U/L (ref 38–126)
Anion gap: 11 (ref 5–15)
BUN: 22 mg/dL (ref 8–23)
CO2: 27 mmol/L (ref 22–32)
Calcium: 9.6 mg/dL (ref 8.9–10.3)
Chloride: 92 mmol/L — ABNORMAL LOW (ref 98–111)
Creatinine, Ser: 0.94 mg/dL (ref 0.44–1.00)
GFR, Estimated: 58 mL/min — ABNORMAL LOW (ref >60)
Glucose, Bld: 140 mg/dL — ABNORMAL HIGH (ref 70–99)
Potassium: 4.2 mmol/L (ref 3.5–5.1)
Sodium: 130 mmol/L — ABNORMAL LOW (ref 135–145)
Total Bilirubin: 0.5 mg/dL (ref 0.3–1.2)
Total Protein: 7.2 g/dL (ref 6.5–8.1)

## 2022-07-11 LAB — LIPASE, BLOOD: Lipase: 17 U/L (ref 11–51)

## 2022-07-11 LAB — TROPONIN I (HIGH SENSITIVITY): Troponin I (High Sensitivity): 9 ng/L (ref <18)

## 2022-07-11 MED ORDER — ONDANSETRON HCL 4 MG/2ML IJ SOLN
4.0000 mg | Freq: Once | INTRAMUSCULAR | Status: AC
  Administered 2022-07-11: 4 mg via INTRAVENOUS
  Filled 2022-07-11: qty 2

## 2022-07-11 MED ORDER — SODIUM CHLORIDE 0.9 % IV BOLUS
1000.0000 mL | Freq: Once | INTRAVENOUS | Status: AC
  Administered 2022-07-11: 1000 mL via INTRAVENOUS

## 2022-07-11 MED ORDER — ONDANSETRON 4 MG PO TBDP: ORAL_TABLET | ORAL | 0 refills | Status: DC

## 2022-07-11 MED ORDER — IOHEXOL 300 MG/ML  SOLN
75.0000 mL | Freq: Once | INTRAMUSCULAR | Status: AC | PRN
  Administered 2022-07-11: 75 mL via INTRAVENOUS

## 2022-07-11 MED ORDER — ONDANSETRON 4 MG PO TBDP
4.0000 mg | ORAL_TABLET | Freq: Once | ORAL | Status: AC
  Administered 2022-07-11: 4 mg via ORAL
  Filled 2022-07-11: qty 1

## 2022-07-11 NOTE — ED Provider Notes (Signed)
[facility] Provider Note   CSN: [id number] Arrival date & time: 07/11/22  1759     History  Chief Complaint  Patient presents with   Nausea    Heather Olsen is a 88 y.o. female.  88 yo F with a cc of nausea.  Has been going on for a week.  She feels like it is worse whenever she coughs.  She coughs frequently and that not uncommon for her.  Has not changed recently.  Whenever she puts her left hearing aid and sometimes it irritates her and makes her cough.  This is been making her vomit.  She had trouble eating and drinking this past week.  No fevers no abdominal pain.  Has a history of some sort of blockage in her abdomen that needed a stent.        Home Medications Prior to Admission medications   Medication Sig Start Date End Date Taking? Authorizing Provider  ondansetron (ZOFRAN-ODT) 4 MG disintegrating tablet '4mg'$  ODT q4 hours prn nausea/vomit 07/11/22  Yes Deno Etienne, DO  acetaminophen (TYLENOL) 650 MG CR tablet 2 tablets    [provider]  celecoxib (CELEBREX) 200 MG capsule TAKE 1 CAPSULE BY MOUTH EVERY DAY 12/16/16   Estill Dooms, MD  Cholecalciferol (VITAMIN D3) 2000 units capsule Take 2,000 Units by mouth daily.     [provider]  denosumab (PROLIA) 60 MG/ML SOSY injection 60 mg 07/25/19   [provider]  ELIQUIS 2.5 MG TABS tablet Take 2.5 mg by mouth 2 (two) times daily. Pt taking 2 mg twice daily 02/03/20   [provider]  escitalopram (LEXAPRO) 10 MG tablet Take 15 mg by mouth daily. Pt taking 1 1/4 tablet daily 12/26/19   [provider]  furosemide (LASIX) 20 MG tablet Take 1 tablet (20 mg total) by mouth daily. 1/60/10   Delora Fuel, MD  gabapentin (NEURONTIN) 300 MG capsule Take 300 mg by mouth at bedtime.    [provider]  ipratropium (ATROVENT) 0.06 % nasal spray Use 2 sprays in each nostril up to three times daily before meals for sinus drainage. Patient taking differently:  Place 2 sprays into the nose daily. 09/30/16   Estill Dooms, MD  Lactobacillus-Inulin (CULTURELLE DIGESTIVE HEALTH PO) Take 1 capsule by mouth daily. Take one daily for probiotic    [provider]  metFORMIN (GLUCOPHAGE-XR) 500 MG 24 hr tablet Take 1 tablet by mouth daily. 02/01/22   [provider]  mirabegron ER (MYRBETRIQ) 50 MG TB24 tablet Take 1 tablet by mouth daily. 08/11/21   [provider]  MULTIPLE VITAMINS-MINERALS PO Take 1 tablet by mouth daily.     [provider]  nitrofurantoin (MACRODANTIN) 100 MG capsule Take 100 mg by mouth daily. Patient not taking: Reported on 02/04/2022    [provider]  nitroGLYCERIN (NITROSTAT) 0.4 MG SL tablet Place 1 tablet (0.4 mg total) under the tongue every 5 (five) minutes as needed for chest pain. Place one tablet under the tongue every 5 minutes as needed for chest pain. No more than 3 9/32/35   Delora Fuel, MD  pantoprazole (PROTONIX) 40 MG tablet Take 40 mg by mouth daily. 12/18/19   [provider]  pindolol (VISKEN) 5 MG tablet Take 1/2 (one-half) tablet by mouth twice daily 07/06/22   Minus Breeding, MD  Probiotic Product (PROBIOTIC DAILY PO) Take by mouth.    [provider]  rOPINIRole (REQUIP) 0.25 MG tablet Take 0.125 mg  by mouth daily.     [provider]  rosuvastatin (CRESTOR) 10 MG tablet TAKE 1 TABLET BY MOUTH AT BEDTIME 11/23/20   Waynetta Sandy, MD  traMADol (ULTRAM) 50 MG tablet TAKE 1 TABLET BY MOUTH EVERY 4 HOURS Patient taking differently: Take 50 mg by mouth every 4 (four) hours as needed for moderate pain or severe pain (pain). 11/14/16   Reed, Tiffany L, DO  trimethoprim (TRIMPEX) 100 MG tablet Take 100 mg by mouth daily. 09/10/21   [provider]  vitamin B-12 (CYANOCOBALAMIN) 1000 MCG tablet Take 1,000 mcg by mouth daily.    [provider]      Allergies    Topiramate, Ace inhibitors, Codeine, Ibandronic acid, Meloxicam,  Prevacid [lansoprazole], Sulfa antibiotics, Verapamil, and Zolpidem    Review of Systems   Review of Systems  Physical Exam Updated Vital Signs BP 102/67 (BP Location: Right Arm)   Pulse 69   Temp 98 F (36.7 C) (Oral)   Resp (!) 22   Ht '5\' 4"'$  (1.626 m)   Wt 51.7 kg   SpO2 96%   BMI 19.57 kg/m  Physical Exam Vitals and nursing note reviewed.  Constitutional:      General: She is not in acute distress.    Appearance: She is well-developed. She is not diaphoretic.  HENT:     Head: Normocephalic and atraumatic.  Eyes:     Pupils: Pupils are equal, round, and reactive to light.  Cardiovascular:     Rate and Rhythm: Normal rate and regular rhythm.     Heart sounds: No murmur heard.    No friction rub. No gallop.  Pulmonary:     Effort: Pulmonary effort is normal.     Breath sounds: No wheezing or rales.  Abdominal:     General: There is no distension.     Palpations: Abdomen is soft.     Tenderness: There is no abdominal tenderness.  Musculoskeletal:        General: No tenderness.     Cervical back: Normal range of motion and neck supple.  Skin:    General: Skin is warm and dry.  Neurological:     Mental Status: She is alert and oriented to person, place, and time.  Psychiatric:        Behavior: Behavior normal.     ED Results / Procedures / Treatments   Labs (all labs ordered are listed, but only abnormal results are displayed) Labs Reviewed  CBC WITH DIFFERENTIAL/PLATELET - Abnormal; Notable for the following components:      Result Value   RBC 3.16 (*)    Hemoglobin 11.1 (*)    HCT 32.6 (*)    MCV 103.2 (*)    MCH 35.1 (*)    Eosinophils Absolute 0.7 (*)    All other components within normal limits  COMPREHENSIVE METABOLIC PANEL - Abnormal; Notable for the following components:   Sodium 130 (*)    Chloride 92 (*)    Glucose, Bld 140 (*)    GFR, Estimated 58 (*)    All other components within normal limits  LIPASE, BLOOD  TROPONIN I (HIGH SENSITIVITY)     EKG EKG Interpretation  Date/Time:  Monday July 11 2022 18:34:19 EDT Ventricular Rate:  58 PR Interval:  287 QRS Duration: 88 QT Interval:  460 QTC Calculation: 452 R Axis:   -56 Text Interpretation: Sinus or ectopic atrial rhythm Atrial premature complex Prolonged PR interval Consider left atrial enlargement Left anterior fascicular  block Probable anterior infarct, age indeterminate No significant change since last tracing Confirmed by Deno Etienne [phone]) on 07/11/2022 8:08:55 PM  Radiology DG Chest Port 1 View  Result Date: 07/11/2022 CLINICAL DATA:  Shortness of breath, nausea, and emesis. EXAM: PORTABLE CHEST 1 VIEW COMPARISON:  07/23/2020. FINDINGS: The heart is enlarged the mediastinal contour is stable. Atherosclerotic calcification of the aorta is noted. No consolidation, effusion, or pneumothorax. There is dextroscoliosis with multilevel degenerative changes in the thoracic spine. No acute osseous abnormality. IMPRESSION: 1. No acute cardiopulmonary process. 2. Cardiomegaly. Electronically Signed   By: Brett Fairy M.D.   On: 07/11/2022 21:23   CT ABDOMEN PELVIS W CONTRAST  Result Date: 07/11/2022 CLINICAL DATA:  Acute nonlocalized abdominal pain EXAM: CT ABDOMEN AND PELVIS WITH CONTRAST TECHNIQUE: Multidetector CT imaging of the abdomen and pelvis was performed using the standard protocol following bolus administration of intravenous contrast. RADIATION DOSE REDUCTION: This exam was performed according to the departmental dose-optimization program which includes automated exposure control, adjustment of the mA and/or kV according to patient size and/or use of iterative reconstruction technique. CONTRAST:  62m OMNIPAQUE IOHEXOL 300 MG/ML  SOLN COMPARISON:  CT abdomen 09/05/2019 and CT abdomen and pelvis 11/07/2017; MR abdomen 11/04/2020 FINDINGS: Lower chest: Cardiomegaly. No pericardial effusion. Bibasilar scarring. Hepatobiliary: No suspicious focal hepatic abnormality.  Redemonstrated abnormal enhancement along the gallbladder fossa within the inferior right hepatic lobe similar to 09/05/2019 possibly representing a benign hemangioma. No gallstones, gallbladder wall thickening, or biliary dilation. Pancreas: Unremarkable. No pancreatic ductal dilatation or surrounding inflammatory changes. Spleen: Normal in size without focal abnormality. Adrenals/Urinary Tract: Normal adrenal glands. Small low-attenuation lesions in the kidneys are too small to definitively characterize but are statistically likely to represent cysts. No follow-up is required. Mild unchanged right hydroureter. No urinary calculi. Unremarkable bladder. Stomach/Bowel: Normal caliber large and small bowel. Colonic diverticulosis without diverticulitis. The appendix is not definitively visualized. No secondary findings of appendicitis. No evidence of bowel wall thickening or adjacent inflammatory change. Unremarkable stomach. Vascular/Lymphatic: Aortic atherosclerosis. Patent stent within the SMA. No enlarged abdominal or pelvic lymph nodes. Reproductive: Uterus and bilateral adnexa are unremarkable. Other: No free intraperitoneal fluid or air. Musculoskeletal: Left convex thoracolumbar curve. No acute osseous abnormality. IMPRESSION: 1. No CT findings for abdominal pain. 2. Diverticulosis without evidence of diverticulitis. 3.  Aortic Atherosclerosis (ICD10-I70.0). Electronically Signed   By: TPlacido SouM.D.   On: 07/11/2022 20:18    Procedures Procedures    Medications Ordered in ED Medications  sodium chloride 0.9 % bolus 1,000 mL (0 mLs Intravenous Stopped 07/11/22 2047)  ondansetron (ZOFRAN) injection 4 mg (4 mg Intravenous Given 07/11/22 1850)  iohexol (OMNIPAQUE) 300 MG/ML solution 75 mL (75 mLs Intravenous Contrast Given 07/11/22 1943)  ondansetron (ZOFRAN-ODT) disintegrating tablet 4 mg (4 mg Oral Given 07/11/22 2222)    ED Course/ Medical Decision Making/ A&P                            Medical Decision Making Amount and/or Complexity of Data Reviewed Labs: ordered. Radiology: ordered.  Risk Prescription drug management.   88yo F with a chief complaint of nausea.  Going on for the past week.  Having some vomiting with it as well.  She has a benign abdominal exam.  We will obtain a laboratory evaluation treat nausea.  Bolus of IV fluids.  CT scan of the abdomen pelvis.  Reassess.  CT scan of the abdomen pelvis without  obvious intra-abdominal pathology.  No leukocytosis mild hyponatremia and hypochloremia renal function appears to be at baseline.  LFTs and lipase are unremarkable.  Patient was feeling a little bit short of breath on reassessment.  Had gone into A-fib with rates in the 70s.  She tells me that she does does go in A-fib off and on.  Chest x-ray was obtained which was negative.  Patient was able to tolerate by mouth without issue.  No ongoing shortness of breath.  The patient would like to go home.  10:45 PM:  I have discussed the diagnosis/risks/treatment options with the patient and family.  Evaluation and diagnostic testing in the emergency department does not suggest an emergent condition requiring admission or immediate intervention beyond what has been performed at this time.  They will follow up with  PCP. We also discussed returning to the ED immediately if new or worsening sx occur. We discussed the sx which are most concerning (e.g., sudden worsening pain, fever, inability to tolerate by mouth) that necessitate immediate return. Medications administered to the patient during their visit and any new prescriptions provided to the patient are listed below.  Medications given during this visit Medications  sodium chloride 0.9 % bolus 1,000 mL (0 mLs Intravenous Stopped 07/11/22 2047)  ondansetron (ZOFRAN) injection 4 mg (4 mg Intravenous Given 07/11/22 1850)  iohexol (OMNIPAQUE) 300 MG/ML solution 75 mL (75 mLs Intravenous Contrast Given 07/11/22 1943)   ondansetron (ZOFRAN-ODT) disintegrating tablet 4 mg (4 mg Oral Given 07/11/22 2222)     The patient appears reasonably screen and/or stabilized for discharge and I doubt any other medical condition or other [facility] requiring further screening, evaluation, or treatment in the ED at this time prior to discharge.          Final Clinical Impression(s) / ED Diagnoses Final diagnoses:  Nausea and vomiting in adult    Rx / DC Orders ED Discharge Orders          Ordered    ondansetron (ZOFRAN-ODT) 4 MG disintegrating tablet        07/11/22 2214              Deno Etienne, DO 07/11/22 2245

## 2022-07-11 NOTE — Discharge Instructions (Signed)
Try the brat diet.  Then you can try to slowly advance your diet as you can.  Please return for chest pain difficulty breathing abdominal pain if you develop a fever or inability eat or drink.  Hope you have a happy birthday.

## 2022-07-11 NOTE — ED Triage Notes (Signed)
Patient arrives ambulatory with cane by POV for nausea and emesis and unable to eat for the past 4 days. C/o feeling weak. Sent by PCP.

## 2022-07-14 DIAGNOSIS — K219 Gastro-esophageal reflux disease without esophagitis: Secondary | ICD-10-CM | POA: Diagnosis not present

## 2022-07-14 DIAGNOSIS — Z8719 Personal history of other diseases of the digestive system: Secondary | ICD-10-CM | POA: Diagnosis not present
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
## 2022-07-15 ENCOUNTER — Encounter: Payer: Medicare Other | Admitting: Oncology

## 2022-07-22 ENCOUNTER — Other Ambulatory Visit: Payer: Self-pay | Admitting: Internal Medicine

## 2022-07-22 DIAGNOSIS — K2100 Gastro-esophageal reflux disease with esophagitis, without bleeding: Secondary | ICD-10-CM

## 2022-07-22 DIAGNOSIS — R1319 Other dysphagia: Secondary | ICD-10-CM

## 2022-07-27 DIAGNOSIS — R110 Nausea: Secondary | ICD-10-CM | POA: Diagnosis not present

## 2022-07-27 DIAGNOSIS — E871 Hypo-osmolality and hyponatremia: Secondary | ICD-10-CM | POA: Diagnosis not present

## 2022-07-27 DIAGNOSIS — F3341 Major depressive disorder, recurrent, in partial remission: Secondary | ICD-10-CM | POA: Diagnosis not present

## 2022-07-28 ENCOUNTER — Ambulatory Visit
Admission: RE | Admit: 2022-07-28 | Discharge: 2022-07-28 | Disposition: A | Discharge status: Home / Self Care | Payer: Medicare Other | Source: Ambulatory Visit | Attending: Internal Medicine | Admitting: Internal Medicine

## 2022-07-28 ENCOUNTER — Other Ambulatory Visit: Payer: Self-pay | Admitting: Internal Medicine

## 2022-07-28 DIAGNOSIS — E7800 Pure hypercholesterolemia, unspecified: Secondary | ICD-10-CM | POA: Diagnosis not present

## 2022-07-28 DIAGNOSIS — K224 Dyskinesia of esophagus: Secondary | ICD-10-CM | POA: Diagnosis not present

## 2022-07-28 DIAGNOSIS — R1319 Other dysphagia: Secondary | ICD-10-CM

## 2022-07-28 DIAGNOSIS — K2100 Gastro-esophageal reflux disease with esophagitis, without bleeding: Secondary | ICD-10-CM

## 2022-07-28 DIAGNOSIS — E119 Type 2 diabetes mellitus without complications: Secondary | ICD-10-CM | POA: Diagnosis not present

## 2022-07-28 DIAGNOSIS — I10 Essential (primary) hypertension: Secondary | ICD-10-CM | POA: Diagnosis not present

## 2022-07-28 DIAGNOSIS — I2510 Atherosclerotic heart disease of native coronary artery without angina pectoris: Secondary | ICD-10-CM | POA: Diagnosis not present

## 2022-08-07 NOTE — Progress Notes (Unsigned)
Cardiology Office Note   Date:  08/09/2022   ID:  Heather Olsen, Dail February 05, 1931, MRN [id number]  PCP:  Deland Pretty, MD  Cardiologist:   Minus Breeding, MD   Chief Complaint  Patient presents with   Shortness of Breath      History of Present Illness: Heather Olsen is a 88 y.o. female who presents for follow up of CAD. She had stent placement in 2008 and was followed by [facility].  She had an LAD stent placed in 2008 which was a 3.8 mm Vision. She had follow-up cath in 2009 with 30%  narrowing of the stent but otherwise no disease. Stress perfusion study in 2014 was negative. She does have some tricuspid regurgitation and some mild mitral regurgitation.  Lexiscan Myoview was negative in 2017.   She had dizziness and was found to have short runs of atrial fib.   She had tachy brady palpitations and so I switched her beta blocker to pindolol.   She had SOB and an echo demonstrated NL LV function with moderately elevated pulmonary pressures.   BNP was mildly elevated.    She was seen in March prior to tongue biopsy.  She presents for follow up.  She reports that she has been getting nauseated and coughing for about 3 weeks.  The nausea triggers the coughing.  She has had decreased appetite.  She has had a work-up to include CT of her abdomen.  She is going to see GI apparently to have her esophagus stretched.  She did have a chest x-ray with out acute disease.  She has had a 4 pound weight loss.  However, she has had some ankle edema.  She denies any chest pressure, neck or arm discomfort.  However, she is short of breath just walking short distance across her apartment.  She is not describing new PND or orthopnea.    Today when we walked her around she was very SOB and sats were 88%.    Past Medical History:  Diagnosis Date   Aortic stenosis 04/12/2016   none noted on echo done 08/13/20   Coronary artery disease    Depression    Diverticulosis    Hearing loss 04/12/2016   Hiatal  hernia 04/12/2016   HLD (hyperlipidemia) 04/12/2016   Hyperglycemia 09/14/2016   Hypertension    Idiopathic scoliosis 04/12/2016   Major depression 04/12/2016   Mitral regurgitation    Osteoporosis, senile 04/12/2016   Pernicious anemia    Raynaud's disease 04/12/2016   Scoliosis    Spinal stenosis of lumbar region 04/12/2016    Past Surgical History:  Procedure Laterality Date   APPENDECTOMY     BREAST SURGERY     broken wrist Right 2007   CARPAL TUNNEL RELEASE Left 04/04/2017   Procedure: LEFT CARPAL TUNNEL RELEASE;  Surgeon: Daryll Brod, MD;  Location: [place];  Service: Orthopedics;  Laterality: Left;  REG/FAB   CARPAL TUNNEL RELEASE Right 12/01/2020   Procedure: CARPAL TUNNEL RELEASE;  Surgeon: Daryll Brod, MD;  Location: [place];  Service: Orthopedics;  Laterality: Right;  IV REGIONAL FOREARM BLOCK   CATARACT EXTRACTION W/ INTRAOCULAR LENS  IMPLANT, BILATERAL Bilateral 2017   COLONOSCOPY  2016   FOOT SURGERY Right    PERIPHERAL VASCULAR INTERVENTION  11/11/2019   Procedure: PERIPHERAL VASCULAR INTERVENTION;  Surgeon: Waynetta Sandy, MD;  Location: [place] [facility];  Service: Cardiovascular;;   TONSILLECTOMY     VISCERAL ANGIOGRAPHY  N/A 11/11/2019   Procedure: VISCERAL ANGIOGRAPHY;  Surgeon: Waynetta Sandy, MD;  Location: [place] [facility];  Service: Cardiovascular;  Laterality: N/A;     Current Outpatient Medications  Medication Sig Dispense Refill   acetaminophen (TYLENOL) 650 MG CR tablet 2 tablets     Cholecalciferol (VITAMIN D3) 2000 units capsule Take 2,000 Units by mouth daily.      denosumab (PROLIA) 60 MG/ML SOSY injection 60 mg     ELIQUIS 2.5 MG TABS tablet Take 2.5 mg by mouth 2 (two) times daily. Pt taking 2 mg twice daily     furosemide (LASIX) 20 MG tablet Take 1 tablet (20 mg total) by mouth daily. 30 tablet 0   ipratropium (ATROVENT) 0.06 % nasal spray Use 2 sprays in each nostril up to three times daily  before meals for sinus drainage. (Patient taking differently: Place 2 sprays into the nose daily.) 30 mL 3   Lactobacillus-Inulin (CULTURELLE DIGESTIVE HEALTH PO) Take 1 capsule by mouth daily. Take one daily for probiotic     LINZESS 145 MCG CAPS capsule Take 145 mcg by mouth every morning.     metFORMIN (GLUCOPHAGE-XR) 500 MG 24 hr tablet Take 1 tablet by mouth daily.     mirabegron ER (MYRBETRIQ) 50 MG TB24 tablet Take 1 tablet by mouth daily.     MULTIPLE VITAMINS-MINERALS PO Take 1 tablet by mouth daily.      nitrofurantoin (MACRODANTIN) 100 MG capsule Take 100 mg by mouth daily.     ondansetron (ZOFRAN-ODT) 4 MG disintegrating tablet '4mg'$  ODT q4 hours prn nausea/vomit 20 tablet 0   pantoprazole (PROTONIX) 40 MG tablet Take 40 mg by mouth daily.     pindolol (VISKEN) 5 MG tablet Take 1/2 (one-half) tablet by mouth twice daily 90 tablet 3   Probiotic Product (PROBIOTIC DAILY PO) Take by mouth.     rosuvastatin (CRESTOR) 10 MG tablet TAKE 1 TABLET BY MOUTH AT BEDTIME 30 tablet 0   sertraline (ZOLOFT) 50 MG tablet Take 50 mg by mouth daily.     traMADol (ULTRAM) 50 MG tablet TAKE 1 TABLET BY MOUTH EVERY 4 HOURS (Patient taking differently: Take 50 mg by mouth every 4 (four) hours as needed for moderate pain or severe pain (pain).) 75 tablet 0   trimethoprim (TRIMPEX) 100 MG tablet Take 100 mg by mouth daily.     vitamin B-12 (CYANOCOBALAMIN) 1000 MCG tablet Take 1,000 mcg by mouth daily.     celecoxib (CELEBREX) 200 MG capsule TAKE 1 CAPSULE BY MOUTH EVERY DAY (Patient not taking: Reported on 08/09/2022) 30 capsule 0   escitalopram (LEXAPRO) 10 MG tablet Take 15 mg by mouth daily. Pt taking 1 1/4 tablet daily (Patient not taking: Reported on 08/09/2022)     gabapentin (NEURONTIN) 300 MG capsule Take 300 mg by mouth at bedtime. (Patient not taking: Reported on 08/09/2022)     nitroGLYCERIN (NITROSTAT) 0.4 MG SL tablet Place 1 tablet (0.4 mg total) under the tongue every 5 (five) minutes as needed for  chest pain. Place one tablet under the tongue every 5 minutes as needed for chest pain. No more than 3 30 tablet 0   rOPINIRole (REQUIP) 0.5 MG tablet Take 2 tablets by mouth daily.     No current facility-administered medications for this visit.    Allergies:   Topiramate, Ace inhibitors, Codeine, Ibandronic acid, Meloxicam, Prevacid [lansoprazole], Sulfa antibiotics, Verapamil, and Zolpidem    ROS:  Please see the history of present illness.  Otherwise, review of systems are positive for back none.   All other systems are reviewed and negative.    PHYSICAL EXAM: VS:  BP 118/72   Pulse (!) 57   Ht '5\' 4"'$  (1.626 m)   Wt 110 lb 6.4 oz (50.1 kg)   SpO2 96%   BMI 18.95 kg/m  , BMI Body mass index is 18.95 kg/m.  GENERAL: Slightly frail appearing NECK:  No jugular venous distention, waveform within normal limits, carotid upstroke brisk and symmetric, no bruits, no thyromegaly LUNGS: Inspiratory wheezing CHEST:  Unremarkable HEART:  PMI not displaced or sustained,S1 and S2 within normal limits, no S3, no S4, no clicks, no rubs, 2 out of 6 apical systolic murmur radiating at aortic outflow tract, no diastolic murmurs ABD:  Flat, positive bowel sounds normal in frequency in pitch, no bruits, no rebound, no guarding, no midline pulsatile mass, no hepatomegaly, no splenomegaly EXT:  2 plus pulses throughout, no edema, no cyanosis no clubbing  EKG:  EKG is  ordered today. Sinus rhythm, rate 57, left axis deviation, premature atrial contractions, first-degree AV block, poor anterior R wave progression, no acute ST-T wave changes.   Recent Labs: 07/11/2022: ALT 12; BUN 22; Creatinine, Ser 0.94; Hemoglobin 11.1; Platelets 389; Potassium 4.2; Sodium 130    Lipid Panel    Component Value Date/Time   CHOL 232 (H) 09/14/2016 1415   TRIG 168 (H) 09/14/2016 1415   HDL 99 09/14/2016 1415   CHOLHDL 2.3 09/14/2016 1415   VLDL 34 (H) 09/14/2016 1415   LDLCALC 99 09/14/2016 1415      Wt  Readings from Last 3 Encounters:  08/09/22 110 lb 6.4 oz (50.1 kg)  07/11/22 114 lb (51.7 kg)  03/22/22 114 lb 6.4 oz (51.9 kg)      Other studies Reviewed: Additional studies/ records that were reviewed today include: Chest x-ray, recent labs, CT Review of the above records demonstrates:  See elsewhere  ASSESSMENT AND PLAN:  CAD:    She has new shortness of breath.  I am going to start with a BNP and an echocardiogram.  If these are normal I will have a low threshold for ischemia work-up.  ATRIAL FIB:   She tolerates anticoagulation.  She is in sinus today.  She does not feel paroxysms.  No change in therapy.   HTN: The blood pressure was at target.  No change in therapy.  She brings a blood pressure diary and it is quite normal.  PULMONARY HTN:       This will be assessed with the echo as above.  She might ultimately need a right heart cath.  Current medicines are reviewed at length with the patient today.  The patient does not have  concerns regarding medicines.  The following changes have been made: None  Labs/ tests ordered today include:      Orders Placed This Encounter  Procedures   Basic metabolic panel   EKG 18-ACZY   ECHOCARDIOGRAM COMPLETE     Disposition:   FU with me after the above studies.   Signed, Minus Breeding, MD  08/09/2022 3:29 PM    [facility]

## 2022-08-09 ENCOUNTER — Ambulatory Visit: Payer: Medicare Other | Attending: Cardiology | Admitting: Cardiology

## 2022-08-09 ENCOUNTER — Encounter: Payer: Self-pay | Admitting: Cardiology

## 2022-08-09 VITALS — BP 118/72 | HR 57 | Ht 64.0 in | Wt 110.4 lb

## 2022-08-09 DIAGNOSIS — I10 Essential (primary) hypertension: Secondary | ICD-10-CM | POA: Diagnosis not present

## 2022-08-09 DIAGNOSIS — R0602 Shortness of breath: Secondary | ICD-10-CM | POA: Insufficient documentation

## 2022-08-09 DIAGNOSIS — I2510 Atherosclerotic heart disease of native coronary artery without angina pectoris: Secondary | ICD-10-CM | POA: Diagnosis not present

## 2022-08-09 DIAGNOSIS — I272 Pulmonary hypertension, unspecified: Secondary | ICD-10-CM | POA: Diagnosis not present

## 2022-08-09 MED ORDER — NITROGLYCERIN 0.4 MG SL SUBL
0.4000 mg | SUBLINGUAL_TABLET | SUBLINGUAL | 0 refills | Status: AC | PRN
Start: 2022-08-09 — End: ?

## 2022-08-09 NOTE — Patient Instructions (Signed)
Medication Instructions:  Your physician recommends that you continue on your current medications as directed. Please refer to the Current Medication list given to you today.  *If you need a refill on your cardiac medications before your next appointment, please call your pharmacy*   Lab Work: BMET If you have labs (blood work) drawn today and your tests are completely normal, you will receive your results only by: [facility] (if you have MyChart) OR A paper copy in the mail If you have any lab test that is abnormal or we need to change your treatment, we will call you to review the results.   Testing/Procedures: Your physician has requested that you have an echocardiogram. Echocardiography is a painless test that uses sound waves to create images of your heart. It provides your doctor with information about the size and shape of your heart and how well your heart's chambers and valves are working. This procedure takes approximately one hour. There are no restrictions for this procedure.   Follow-Up: At [facility], you and your health needs are our priority.  As part of our continuing mission to provide you with exceptional heart care, we have created designated Provider Care Teams.  These Care Teams include your primary Cardiologist (physician) and Advanced Practice Providers (APPs -  Physician Assistants and Nurse Practitioners) who all work together to provide you with the care you need, when you need it.  We recommend signing up for the patient portal called "MyChart".  Sign up information is provided on this After Visit Summary.  MyChart is used to connect with patients for Virtual Visits (Telemedicine).  Patients are able to view lab/test results, encounter notes, upcoming appointments, etc.  Non-urgent messages can be sent to your provider as well.   To learn more about what you can do with MyChart, go to [email or web address].    Your next appointment:   After  Ecocardiogram  The format for your next appointment:   In Person  Provider:   Minus Breeding, MD    Other Instructions

## 2022-08-10 ENCOUNTER — Encounter: Payer: Self-pay | Admitting: *Deleted

## 2022-08-10 DIAGNOSIS — H00014 Hordeolum externum left upper eyelid: Secondary | ICD-10-CM | POA: Diagnosis not present

## 2022-08-10 LAB — BASIC METABOLIC PANEL
BUN/Creatinine Ratio: 23 (ref 12–28)
BUN: 21 mg/dL (ref 8–27)
CO2: 24 mmol/L (ref 20–29)
Calcium: 10 mg/dL (ref 8.7–10.3)
Chloride: 93 mmol/L — ABNORMAL LOW (ref 96–106)
Creatinine, Ser: 0.91 mg/dL (ref 0.57–1.00)
Glucose: 115 mg/dL — ABNORMAL HIGH (ref 70–99)
Potassium: 4.5 mmol/L (ref 3.5–5.2)
Sodium: 133 mmol/L — ABNORMAL LOW (ref 134–144)
eGFR: 60 mL/min/{1.73_m2} (ref >59)

## 2022-08-11 ENCOUNTER — Telehealth: Payer: Self-pay | Admitting: Cardiology

## 2022-08-11 DIAGNOSIS — E119 Type 2 diabetes mellitus without complications: Secondary | ICD-10-CM | POA: Diagnosis not present

## 2022-08-11 DIAGNOSIS — Z7901 Long term (current) use of anticoagulants: Secondary | ICD-10-CM | POA: Diagnosis not present

## 2022-08-11 DIAGNOSIS — Z01818 Encounter for other preprocedural examination: Secondary | ICD-10-CM | POA: Diagnosis not present

## 2022-08-11 DIAGNOSIS — I340 Nonrheumatic mitral (valve) insufficiency: Secondary | ICD-10-CM | POA: Diagnosis not present

## 2022-08-11 DIAGNOSIS — R0602 Shortness of breath: Secondary | ICD-10-CM | POA: Diagnosis not present

## 2022-08-11 DIAGNOSIS — I10 Essential (primary) hypertension: Secondary | ICD-10-CM | POA: Diagnosis not present

## 2022-08-11 DIAGNOSIS — E785 Hyperlipidemia, unspecified: Secondary | ICD-10-CM | POA: Diagnosis not present

## 2022-08-11 DIAGNOSIS — I350 Nonrheumatic aortic (valve) stenosis: Secondary | ICD-10-CM | POA: Diagnosis not present

## 2022-08-11 DIAGNOSIS — I4891 Unspecified atrial fibrillation: Secondary | ICD-10-CM | POA: Diagnosis not present

## 2022-08-11 DIAGNOSIS — I2510 Atherosclerotic heart disease of native coronary artery without angina pectoris: Secondary | ICD-10-CM | POA: Diagnosis not present

## 2022-08-11 NOTE — Telephone Encounter (Signed)
Gerald Stabs, NP with [facility] is following up regarding clearance. Their office saw the patient today she noticed a BMET was drawn instead of BNP. They took BNP and it was 320 - just FYI. If further questions/updates on clearance return call to their office at [phone].

## 2022-08-11 NOTE — Telephone Encounter (Signed)
   Pre-operative Risk Assessment    Patient Name: Heather Olsen  DOB: [birth year] MRN: [id number]      Request for Surgical Clearance    Procedure:   Endoscopy  Date of Surgery:  Clearance 08/15/22                                 Surgeon:  Dr. Liz Malady Surgeon's Group or Practice Name:  [facility] Phone number:  [phone] Fax number:  [phone]   Type of Clearance Requested:   - Pharmacy:  Hold Apixaban (Eliquis) Hold starting 08/13/22   Type of Anesthesia:   Light Sedation   Additional requests/questions:  Please advise surgeon/provider what medications should be held.  Signed, Belisicia T Harris   08/11/2022, 11:21 AM

## 2022-08-11 NOTE — Telephone Encounter (Addendum)
Covering preop today. Patient recently saw Dr. Percival Spanish in clinic 08/09/22 who states "She has new shortness of breath.  I am going to start with a BNP and an echocardiogram.  If these are normal I will have a low threshold for ischemia work-up." BNP was 320 by telephone note below. Echo is pending. I will route to Dr. Percival Spanish for input on clearance for endoscopy as procedure is only 4 days away. Dr. Percival Spanish - Please route response to P CV DIV PREOP (the pre-op pool). Thank you.  Will send secure chat FYI given time sensitive nature. Had not heard back yet. Will also route to pharm for input on anticoag.

## 2022-08-12 NOTE — Telephone Encounter (Signed)
Left message on number given for call back to discuss patient not able to proceed with scheduled procedure until testing is complete

## 2022-08-17 NOTE — Telephone Encounter (Signed)
    Patient Name: Heather Olsen  DOB: 06-18-1931 MRN: [id number]  Primary Cardiologist: Minus Breeding, MD  Chart reviewed as part of pre-operative protocol coverage.   The patient already has an upcoming appointment scheduled 08/26/22 with Dr. Percival Spanish at which time this clearance should be addressed. As per notes below, Dr. Percival Spanish did not feel she could be cleared for procedure until her workup for shortness of breath was completed. She has echo scheduled 9/26 and follow-up visit 08/26/22 to revisit. I added "preop" comment to appointment notes so that provider is aware to address at time of OV. Per office protocol, the provider seeing this patient should forward their finalized clearance decision to requesting party below.  - I will route this message to our pharmacy team for review of anticoagulation so that these recommendations would be available for Dr. Percival Spanish to reference at upcoming [place].  - Will fax update to requesting surgeon so they are aware. Will remove from preop box as separate preop APP input not necessary at this time.  Charlie Pitter, PA-C 08/17/2022, 1:51 PM

## 2022-08-18 NOTE — Telephone Encounter (Signed)
Patient with diagnosis of PAF on Eliquis for anticoagulation.    Procedure: endoscopy Date of procedure: 08/15/22   CHA2DS2-VASc Score = 7  This indicates a 11.2% annual risk of stroke. The patient's score is based upon: CHF History: 1 HTN History: 1 Diabetes History: 1 Stroke History: 0 Vascular Disease History: 1 Age Score: 2 Gender Score: 1    CrCl 33 mL/min Platelet count 389K   Per office protocol, patient can hold Eliquis for 1 day prior to procedure.     **This guidance is not considered finalized until pre-operative APP has relayed final recommendations.**

## 2022-08-22 DIAGNOSIS — F3341 Major depressive disorder, recurrent, in partial remission: Secondary | ICD-10-CM | POA: Diagnosis not present

## 2022-08-22 DIAGNOSIS — R1084 Generalized abdominal pain: Secondary | ICD-10-CM | POA: Diagnosis not present

## 2022-08-22 DIAGNOSIS — R1310 Dysphagia, unspecified: Secondary | ICD-10-CM | POA: Diagnosis not present

## 2022-08-23 ENCOUNTER — Ambulatory Visit (INDEPENDENT_AMBULATORY_CARE_PROVIDER_SITE_OTHER): Payer: Medicare Other

## 2022-08-23 DIAGNOSIS — L820 Inflamed seborrheic keratosis: Secondary | ICD-10-CM | POA: Diagnosis not present

## 2022-08-23 DIAGNOSIS — L570 Actinic keratosis: Secondary | ICD-10-CM | POA: Diagnosis not present

## 2022-08-23 DIAGNOSIS — L853 Xerosis cutis: Secondary | ICD-10-CM | POA: Diagnosis not present

## 2022-08-23 DIAGNOSIS — I2510 Atherosclerotic heart disease of native coronary artery without angina pectoris: Secondary | ICD-10-CM | POA: Diagnosis not present

## 2022-08-23 DIAGNOSIS — L821 Other seborrheic keratosis: Secondary | ICD-10-CM | POA: Diagnosis not present

## 2022-08-23 DIAGNOSIS — D1801 Hemangioma of skin and subcutaneous tissue: Secondary | ICD-10-CM | POA: Diagnosis not present

## 2022-08-23 DIAGNOSIS — R0602 Shortness of breath: Secondary | ICD-10-CM

## 2022-08-23 LAB — ECHOCARDIOGRAM COMPLETE
Area-P 1/2: 4.21 cm2
S' Lateral: 1.69 cm

## 2022-08-24 ENCOUNTER — Emergency Department (HOSPITAL_COMMUNITY): Payer: Medicare Other

## 2022-08-24 ENCOUNTER — Encounter (HOSPITAL_COMMUNITY): Payer: Self-pay

## 2022-08-24 ENCOUNTER — Other Ambulatory Visit: Payer: Self-pay

## 2022-08-24 ENCOUNTER — Inpatient Hospital Stay (HOSPITAL_COMMUNITY)
Admission: EM | Admit: 2022-08-24 | Discharge: 2022-08-28 | DRG: 392 | Disposition: A | Discharge status: Home / Self Care | Payer: Medicare Other | Attending: Student | Admitting: Student

## 2022-08-24 DIAGNOSIS — D539 Nutritional anemia, unspecified: Secondary | ICD-10-CM | POA: Diagnosis present

## 2022-08-24 DIAGNOSIS — R634 Abnormal weight loss: Secondary | ICD-10-CM

## 2022-08-24 DIAGNOSIS — K5792 Diverticulitis of intestine, part unspecified, without perforation or abscess without bleeding: Secondary | ICD-10-CM | POA: Diagnosis not present

## 2022-08-24 DIAGNOSIS — I110 Hypertensive heart disease with heart failure: Secondary | ICD-10-CM | POA: Diagnosis present

## 2022-08-24 DIAGNOSIS — Z79899 Other long term (current) drug therapy: Secondary | ICD-10-CM

## 2022-08-24 DIAGNOSIS — I7300 Raynaud's syndrome without gangrene: Secondary | ICD-10-CM | POA: Diagnosis not present

## 2022-08-24 DIAGNOSIS — Z961 Presence of intraocular lens: Secondary | ICD-10-CM | POA: Diagnosis present

## 2022-08-24 DIAGNOSIS — Z886 Allergy status to analgesic agent status: Secondary | ICD-10-CM

## 2022-08-24 DIAGNOSIS — Z888 Allergy status to other drugs, medicaments and biological substances status: Secondary | ICD-10-CM

## 2022-08-24 DIAGNOSIS — K5909 Other constipation: Secondary | ICD-10-CM | POA: Diagnosis present

## 2022-08-24 DIAGNOSIS — M419 Scoliosis, unspecified: Secondary | ICD-10-CM | POA: Diagnosis not present

## 2022-08-24 DIAGNOSIS — I5032 Chronic diastolic (congestive) heart failure: Secondary | ICD-10-CM | POA: Diagnosis not present

## 2022-08-24 DIAGNOSIS — I083 Combined rheumatic disorders of mitral, aortic and tricuspid valves: Secondary | ICD-10-CM | POA: Diagnosis present

## 2022-08-24 DIAGNOSIS — M4120 Other idiopathic scoliosis, site unspecified: Secondary | ICD-10-CM | POA: Diagnosis present

## 2022-08-24 DIAGNOSIS — I2510 Atherosclerotic heart disease of native coronary artery without angina pectoris: Secondary | ICD-10-CM | POA: Diagnosis not present

## 2022-08-24 DIAGNOSIS — N1831 Chronic kidney disease, stage 3a: Secondary | ICD-10-CM | POA: Diagnosis not present

## 2022-08-24 DIAGNOSIS — K5732 Diverticulitis of large intestine without perforation or abscess without bleeding: Secondary | ICD-10-CM | POA: Diagnosis not present

## 2022-08-24 DIAGNOSIS — E1165 Type 2 diabetes mellitus with hyperglycemia: Secondary | ICD-10-CM | POA: Diagnosis present

## 2022-08-24 DIAGNOSIS — E876 Hypokalemia: Secondary | ICD-10-CM | POA: Diagnosis not present

## 2022-08-24 DIAGNOSIS — I071 Rheumatic tricuspid insufficiency: Secondary | ICD-10-CM | POA: Diagnosis not present

## 2022-08-24 DIAGNOSIS — R5381 Other malaise: Secondary | ICD-10-CM | POA: Diagnosis not present

## 2022-08-24 DIAGNOSIS — H9190 Unspecified hearing loss, unspecified ear: Secondary | ICD-10-CM | POA: Diagnosis present

## 2022-08-24 DIAGNOSIS — E785 Hyperlipidemia, unspecified: Secondary | ICD-10-CM | POA: Diagnosis not present

## 2022-08-24 DIAGNOSIS — R109 Unspecified abdominal pain: Secondary | ICD-10-CM | POA: Diagnosis not present

## 2022-08-24 DIAGNOSIS — E119 Type 2 diabetes mellitus without complications: Secondary | ICD-10-CM | POA: Diagnosis present

## 2022-08-24 DIAGNOSIS — Z7984 Long term (current) use of oral hypoglycemic drugs: Secondary | ICD-10-CM | POA: Diagnosis not present

## 2022-08-24 DIAGNOSIS — E118 Type 2 diabetes mellitus with unspecified complications: Secondary | ICD-10-CM | POA: Diagnosis present

## 2022-08-24 DIAGNOSIS — Z79891 Long term (current) use of opiate analgesic: Secondary | ICD-10-CM | POA: Diagnosis not present

## 2022-08-24 DIAGNOSIS — Z7901 Long term (current) use of anticoagulants: Secondary | ICD-10-CM

## 2022-08-24 DIAGNOSIS — E1129 Type 2 diabetes mellitus with other diabetic kidney complication: Secondary | ICD-10-CM | POA: Diagnosis present

## 2022-08-24 DIAGNOSIS — R531 Weakness: Secondary | ICD-10-CM | POA: Diagnosis present

## 2022-08-24 DIAGNOSIS — I480 Paroxysmal atrial fibrillation: Secondary | ICD-10-CM | POA: Diagnosis not present

## 2022-08-24 DIAGNOSIS — K6289 Other specified diseases of anus and rectum: Secondary | ICD-10-CM | POA: Diagnosis present

## 2022-08-24 DIAGNOSIS — Z885 Allergy status to narcotic agent status: Secondary | ICD-10-CM

## 2022-08-24 DIAGNOSIS — E871 Hypo-osmolality and hyponatremia: Secondary | ICD-10-CM | POA: Diagnosis present

## 2022-08-24 DIAGNOSIS — M810 Age-related osteoporosis without current pathological fracture: Secondary | ICD-10-CM | POA: Diagnosis present

## 2022-08-24 DIAGNOSIS — Z882 Allergy status to sulfonamides status: Secondary | ICD-10-CM

## 2022-08-24 DIAGNOSIS — F39 Unspecified mood [affective] disorder: Secondary | ICD-10-CM | POA: Diagnosis present

## 2022-08-24 DIAGNOSIS — G2581 Restless legs syndrome: Secondary | ICD-10-CM | POA: Diagnosis not present

## 2022-08-24 DIAGNOSIS — Z8249 Family history of ischemic heart disease and other diseases of the circulatory system: Secondary | ICD-10-CM

## 2022-08-24 DIAGNOSIS — R7309 Other abnormal glucose: Secondary | ICD-10-CM

## 2022-08-24 DIAGNOSIS — D649 Anemia, unspecified: Secondary | ICD-10-CM | POA: Diagnosis not present

## 2022-08-24 DIAGNOSIS — R1084 Generalized abdominal pain: Secondary | ICD-10-CM | POA: Diagnosis not present

## 2022-08-24 DIAGNOSIS — I700 Atherosclerosis of aorta: Secondary | ICD-10-CM | POA: Diagnosis not present

## 2022-08-24 NOTE — ED Notes (Signed)
IV/ Labs for CT

## 2022-08-24 NOTE — ED Provider Triage Note (Signed)
Emergency Medicine Provider Triage Evaluation Note  Heather Olsen , a 88 y.o. female  was evaluated in triage.  Pt complains of abdominal pain.  Patient reports for about a month now she has been having some nausea and dry heaving with occasional vomiting.  This visit is usually associated with cough but cough has resolved.  She reports that she has seen her GI doctor regarding this and they were working on scheduling an endoscopy but she needed cardiac clearance.  She had been having some shortness of breath so went to see her cardiologist and had an echocardiogram today.  She reports over the past 3 days she has had some worsening abdominal pain and distention with pain primarily in the lower abdomen.  She reports she is felt some bloating and gas buildup but has not passed much gas and has been constipated.  Has had some belching.  Review of Systems  Positive: Abdominal pain, bloating, nausea, constipation, shortness of breath Negative: Fevers, chest pain, diarrhea, blood in stool  Physical Exam  BP (!) 141/75   Pulse 80   Temp 98.8 F (37.1 C) (Oral)   Resp 18   SpO2 94%  Gen:   Awake, no distress   Resp:  Normal effort  MSK:   Moves extremities without difficulty  Other:  Abdomen is distended tenderness primarily across the lower abdomen  Medical Decision Making  Medically screening exam initiated at 11:13 PM.  Appropriate orders placed.  Heidemarie Markov was informed that the remainder of the evaluation will be completed by another provider, this initial triage assessment does not replace that evaluation, and the importance of remaining in the ED until their evaluation is complete.  Concern for potential bowel obstruction.  Labs, acute abdominal series and CT ordered.   Jacqlyn Larsen, [place] 08/24/22 2330

## 2022-08-24 NOTE — ED Triage Notes (Signed)
Patient reports abdominal pain for a month. Patient has had no relief even after seeing her PCP. Brought in by EMS. Patient is from [facility]

## 2022-08-25 ENCOUNTER — Emergency Department (HOSPITAL_COMMUNITY): Payer: Medicare Other

## 2022-08-25 ENCOUNTER — Encounter (HOSPITAL_COMMUNITY): Payer: Self-pay

## 2022-08-25 DIAGNOSIS — I071 Rheumatic tricuspid insufficiency: Secondary | ICD-10-CM

## 2022-08-25 DIAGNOSIS — E785 Hyperlipidemia, unspecified: Secondary | ICD-10-CM | POA: Diagnosis present

## 2022-08-25 DIAGNOSIS — K5909 Other constipation: Secondary | ICD-10-CM | POA: Diagnosis present

## 2022-08-25 DIAGNOSIS — I480 Paroxysmal atrial fibrillation: Secondary | ICD-10-CM | POA: Diagnosis present

## 2022-08-25 DIAGNOSIS — I5032 Chronic diastolic (congestive) heart failure: Secondary | ICD-10-CM | POA: Diagnosis present

## 2022-08-25 DIAGNOSIS — E876 Hypokalemia: Secondary | ICD-10-CM | POA: Diagnosis present

## 2022-08-25 DIAGNOSIS — M4120 Other idiopathic scoliosis, site unspecified: Secondary | ICD-10-CM | POA: Diagnosis present

## 2022-08-25 DIAGNOSIS — R634 Abnormal weight loss: Secondary | ICD-10-CM

## 2022-08-25 DIAGNOSIS — K5792 Diverticulitis of intestine, part unspecified, without perforation or abscess without bleeding: Secondary | ICD-10-CM | POA: Diagnosis present

## 2022-08-25 DIAGNOSIS — Z7901 Long term (current) use of anticoagulants: Secondary | ICD-10-CM | POA: Diagnosis not present

## 2022-08-25 DIAGNOSIS — I2510 Atherosclerotic heart disease of native coronary artery without angina pectoris: Secondary | ICD-10-CM

## 2022-08-25 DIAGNOSIS — G2581 Restless legs syndrome: Secondary | ICD-10-CM

## 2022-08-25 DIAGNOSIS — N1831 Chronic kidney disease, stage 3a: Secondary | ICD-10-CM | POA: Diagnosis not present

## 2022-08-25 DIAGNOSIS — R531 Weakness: Secondary | ICD-10-CM | POA: Diagnosis present

## 2022-08-25 DIAGNOSIS — I083 Combined rheumatic disorders of mitral, aortic and tricuspid valves: Secondary | ICD-10-CM | POA: Diagnosis present

## 2022-08-25 DIAGNOSIS — I110 Hypertensive heart disease with heart failure: Secondary | ICD-10-CM | POA: Diagnosis present

## 2022-08-25 DIAGNOSIS — F39 Unspecified mood [affective] disorder: Secondary | ICD-10-CM | POA: Diagnosis present

## 2022-08-25 DIAGNOSIS — I7300 Raynaud's syndrome without gangrene: Secondary | ICD-10-CM | POA: Diagnosis present

## 2022-08-25 DIAGNOSIS — E118 Type 2 diabetes mellitus with unspecified complications: Secondary | ICD-10-CM

## 2022-08-25 DIAGNOSIS — K5732 Diverticulitis of large intestine without perforation or abscess without bleeding: Secondary | ICD-10-CM | POA: Diagnosis present

## 2022-08-25 DIAGNOSIS — Z7984 Long term (current) use of oral hypoglycemic drugs: Secondary | ICD-10-CM | POA: Diagnosis not present

## 2022-08-25 DIAGNOSIS — D539 Nutritional anemia, unspecified: Secondary | ICD-10-CM | POA: Diagnosis present

## 2022-08-25 DIAGNOSIS — Z79891 Long term (current) use of opiate analgesic: Secondary | ICD-10-CM | POA: Diagnosis not present

## 2022-08-25 DIAGNOSIS — R5381 Other malaise: Secondary | ICD-10-CM | POA: Diagnosis not present

## 2022-08-25 DIAGNOSIS — M810 Age-related osteoporosis without current pathological fracture: Secondary | ICD-10-CM | POA: Diagnosis present

## 2022-08-25 DIAGNOSIS — E1165 Type 2 diabetes mellitus with hyperglycemia: Secondary | ICD-10-CM | POA: Diagnosis present

## 2022-08-25 DIAGNOSIS — E871 Hypo-osmolality and hyponatremia: Secondary | ICD-10-CM | POA: Diagnosis present

## 2022-08-25 DIAGNOSIS — K6289 Other specified diseases of anus and rectum: Secondary | ICD-10-CM | POA: Diagnosis present

## 2022-08-25 DIAGNOSIS — H9190 Unspecified hearing loss, unspecified ear: Secondary | ICD-10-CM | POA: Diagnosis present

## 2022-08-25 LAB — CBC WITH DIFFERENTIAL/PLATELET
Abs Immature Granulocytes: 0.11 10*3/uL — ABNORMAL HIGH (ref 0.00–0.07)
Basophils Absolute: 0.1 10*3/uL (ref 0.0–0.1)
Basophils Relative: 0 %
Eosinophils Absolute: 0 10*3/uL (ref 0.0–0.5)
Eosinophils Relative: 0 %
HCT: 32.9 % — ABNORMAL LOW (ref 36.0–46.0)
Hemoglobin: 10.8 g/dL — ABNORMAL LOW (ref 12.0–15.0)
Immature Granulocytes: 1 %
Lymphocytes Relative: 11 %
Lymphs Abs: 1.5 10*3/uL (ref 0.7–4.0)
MCH: 33.5 pg (ref 26.0–34.0)
MCHC: 32.8 g/dL (ref 30.0–36.0)
MCV: 102.2 fL — ABNORMAL HIGH (ref 80.0–100.0)
Monocytes Absolute: 1.2 10*3/uL — ABNORMAL HIGH (ref 0.1–1.0)
Monocytes Relative: 9 %
Neutro Abs: 10.7 10*3/uL — ABNORMAL HIGH (ref 1.7–7.7)
Neutrophils Relative %: 79 %
Platelets: 359 10*3/uL (ref 150–400)
RBC: 3.22 MIL/uL — ABNORMAL LOW (ref 3.87–5.11)
RDW: 12.9 % (ref 11.5–15.5)
WBC: 13.6 10*3/uL — ABNORMAL HIGH (ref 4.0–10.5)
nRBC: 0 % (ref 0.0–0.2)

## 2022-08-25 LAB — URINALYSIS, ROUTINE W REFLEX MICROSCOPIC
Bilirubin Urine: NEGATIVE
Glucose, UA: NEGATIVE mg/dL
Hgb urine dipstick: NEGATIVE
Ketones, ur: NEGATIVE mg/dL
Leukocytes,Ua: NEGATIVE
Nitrite: NEGATIVE
Protein, ur: NEGATIVE mg/dL
Specific Gravity, Urine: 1.012 (ref 1.005–1.030)
pH: 7 (ref 5.0–8.0)

## 2022-08-25 LAB — COMPREHENSIVE METABOLIC PANEL
ALT: 14 U/L (ref 0–44)
AST: 21 U/L (ref 15–41)
Albumin: 3.4 g/dL — ABNORMAL LOW (ref 3.5–5.0)
Alkaline Phosphatase: 80 U/L (ref 38–126)
Anion gap: 9 (ref 5–15)
BUN: 19 mg/dL (ref 8–23)
CO2: 26 mmol/L (ref 22–32)
Calcium: 9.4 mg/dL (ref 8.9–10.3)
Chloride: 98 mmol/L (ref 98–111)
Creatinine, Ser: 0.85 mg/dL (ref 0.44–1.00)
GFR, Estimated: >60 mL/min (ref >60)
Glucose, Bld: 172 mg/dL — ABNORMAL HIGH (ref 70–99)
Potassium: 3.5 mmol/L (ref 3.5–5.1)
Sodium: 133 mmol/L — ABNORMAL LOW (ref 135–145)
Total Bilirubin: 0.8 mg/dL (ref 0.3–1.2)
Total Protein: 6.5 g/dL (ref 6.5–8.1)

## 2022-08-25 LAB — GLUCOSE, CAPILLARY
Glucose-Capillary: 158 mg/dL — ABNORMAL HIGH (ref 70–99)
Glucose-Capillary: 228 mg/dL — ABNORMAL HIGH (ref 70–99)

## 2022-08-25 LAB — HEMOGLOBIN A1C
Hgb A1c MFr Bld: 7.1 % — ABNORMAL HIGH (ref 4.8–5.6)
Mean Plasma Glucose: 157.07 mg/dL

## 2022-08-25 LAB — CBG MONITORING, ED: Glucose-Capillary: 153 mg/dL — ABNORMAL HIGH (ref 70–99)

## 2022-08-25 LAB — LACTIC ACID, PLASMA: Lactic Acid, Venous: 1.3 mmol/L (ref 0.5–1.9)

## 2022-08-25 LAB — LIPASE, BLOOD: Lipase: 27 U/L (ref 11–51)

## 2022-08-25 MED ORDER — PINDOLOL 5 MG PO TABS
2.5000 mg | ORAL_TABLET | Freq: Two times a day (BID) | ORAL | Status: DC
  Administered 2022-08-25 – 2022-08-28 (×7): 2.5 mg via ORAL
  Filled 2022-08-25 (×9): qty 1

## 2022-08-25 MED ORDER — APIXABAN 2.5 MG PO TABS
2.5000 mg | ORAL_TABLET | Freq: Two times a day (BID) | ORAL | Status: DC
  Administered 2022-08-25 – 2022-08-28 (×7): 2.5 mg via ORAL
  Filled 2022-08-25 (×7): qty 1

## 2022-08-25 MED ORDER — SODIUM CHLORIDE (PF) 0.9 % IJ SOLN
INTRAMUSCULAR | Status: AC
Start: 2022-08-25 — End: 2022-08-25
  Filled 2022-08-25: qty 50

## 2022-08-25 MED ORDER — ACETAMINOPHEN 650 MG RE SUPP: 650.0000 mg | Freq: Four times a day (QID) | RECTAL | Status: DC | PRN

## 2022-08-25 MED ORDER — ROSUVASTATIN CALCIUM 10 MG PO TABS
10.0000 mg | ORAL_TABLET | Freq: Every day | ORAL | Status: DC
  Administered 2022-08-25 – 2022-08-27 (×3): 10 mg via ORAL
  Filled 2022-08-25 (×3): qty 1

## 2022-08-25 MED ORDER — FAMOTIDINE 20 MG PO TABS
40.0000 mg | ORAL_TABLET | Freq: Every day | ORAL | Status: DC
  Administered 2022-08-25 – 2022-08-27 (×3): 40 mg via ORAL
  Filled 2022-08-25 (×3): qty 2

## 2022-08-25 MED ORDER — MIRABEGRON ER 25 MG PO TB24
50.0000 mg | ORAL_TABLET | Freq: Every day | ORAL | Status: DC
  Administered 2022-08-25 – 2022-08-28 (×4): 50 mg via ORAL
  Filled 2022-08-25 (×5): qty 2

## 2022-08-25 MED ORDER — METRONIDAZOLE 500 MG/100ML IV SOLN
500.0000 mg | Freq: Once | INTRAVENOUS | Status: AC
  Administered 2022-08-25: 500 mg via INTRAVENOUS
  Filled 2022-08-25: qty 100

## 2022-08-25 MED ORDER — SODIUM CHLORIDE 0.9 % IV SOLN
1.0000 g | INTRAVENOUS | Status: DC
  Filled 2022-08-25: qty 10

## 2022-08-25 MED ORDER — ASPIRIN 81 MG PO TBEC
81.0000 mg | DELAYED_RELEASE_TABLET | Freq: Every day | ORAL | Status: DC
  Filled 2022-08-25 (×4): qty 1

## 2022-08-25 MED ORDER — MORPHINE SULFATE (PF) 4 MG/ML IV SOLN
4.0000 mg | Freq: Once | INTRAVENOUS | Status: AC
  Administered 2022-08-25: 4 mg via INTRAVENOUS
  Filled 2022-08-25: qty 1

## 2022-08-25 MED ORDER — ROPINIROLE HCL 1 MG PO TABS
0.5000 mg | ORAL_TABLET | Freq: Every day | ORAL | Status: DC
  Administered 2022-08-25 – 2022-08-27 (×3): 0.5 mg via ORAL
  Filled 2022-08-25 (×3): qty 1

## 2022-08-25 MED ORDER — INSULIN ASPART 100 UNIT/ML IJ SOLN
0.0000 [IU] | Freq: Every day | INTRAMUSCULAR | Status: DC
  Administered 2022-08-25 – 2022-08-27 (×2): 2 [IU] via SUBCUTANEOUS
  Filled 2022-08-25: qty 0.05

## 2022-08-25 MED ORDER — ONDANSETRON HCL 4 MG/2ML IJ SOLN
4.0000 mg | Freq: Four times a day (QID) | INTRAMUSCULAR | Status: DC | PRN
  Administered 2022-08-25 (×2): 4 mg via INTRAVENOUS
  Filled 2022-08-25 (×2): qty 2

## 2022-08-25 MED ORDER — HYDROCODONE-ACETAMINOPHEN 5-325 MG PO TABS
1.0000 | ORAL_TABLET | ORAL | Status: DC | PRN
  Administered 2022-08-26 – 2022-08-28 (×4): 1 via ORAL
  Filled 2022-08-25 (×4): qty 1

## 2022-08-25 MED ORDER — CIPROFLOXACIN IN D5W 400 MG/200ML IV SOLN
400.0000 mg | Freq: Once | INTRAVENOUS | Status: AC
  Administered 2022-08-25: 400 mg via INTRAVENOUS
  Filled 2022-08-25: qty 200

## 2022-08-25 MED ORDER — SODIUM CHLORIDE 0.9 % IV SOLN
2.0000 g | INTRAVENOUS | Status: DC
  Administered 2022-08-25 – 2022-08-26 (×2): 2 g via INTRAVENOUS
  Filled 2022-08-25 (×2): qty 20

## 2022-08-25 MED ORDER — METRONIDAZOLE 500 MG PO TABS
500.0000 mg | ORAL_TABLET | Freq: Two times a day (BID) | ORAL | Status: DC
  Administered 2022-08-25 – 2022-08-26 (×4): 500 mg via ORAL
  Filled 2022-08-25 (×4): qty 1

## 2022-08-25 MED ORDER — INSULIN ASPART 100 UNIT/ML IJ SOLN
0.0000 [IU] | Freq: Three times a day (TID) | INTRAMUSCULAR | Status: DC
  Administered 2022-08-25: 2 [IU] via SUBCUTANEOUS
  Administered 2022-08-26: 1 [IU] via SUBCUTANEOUS
  Administered 2022-08-26: 2 [IU] via SUBCUTANEOUS
  Administered 2022-08-26: 1 [IU] via SUBCUTANEOUS
  Administered 2022-08-27: 2 [IU] via SUBCUTANEOUS
  Administered 2022-08-27: 3 [IU] via SUBCUTANEOUS
  Administered 2022-08-28: 2 [IU] via SUBCUTANEOUS
  Administered 2022-08-28 (×2): 1 [IU] via SUBCUTANEOUS
  Filled 2022-08-25: qty 0.09

## 2022-08-25 MED ORDER — IOHEXOL 300 MG/ML  SOLN
75.0000 mL | Freq: Once | INTRAMUSCULAR | Status: AC | PRN
  Administered 2022-08-25: 75 mL via INTRAVENOUS

## 2022-08-25 MED ORDER — PANTOPRAZOLE SODIUM 40 MG PO TBEC
40.0000 mg | DELAYED_RELEASE_TABLET | Freq: Two times a day (BID) | ORAL | Status: DC
  Administered 2022-08-25 – 2022-08-28 (×7): 40 mg via ORAL
  Filled 2022-08-25 (×7): qty 1

## 2022-08-25 MED ORDER — SERTRALINE HCL 25 MG PO TABS
25.0000 mg | ORAL_TABLET | Freq: Every day | ORAL | Status: DC
  Administered 2022-08-25 – 2022-08-27 (×3): 25 mg via ORAL
  Filled 2022-08-25 (×3): qty 1

## 2022-08-25 MED ORDER — LACTATED RINGERS IV BOLUS
1000.0000 mL | Freq: Once | INTRAVENOUS | Status: AC
  Administered 2022-08-25: 1000 mL via INTRAVENOUS

## 2022-08-25 MED ORDER — MORPHINE SULFATE (PF) 2 MG/ML IV SOLN
2.0000 mg | INTRAVENOUS | Status: DC | PRN
  Administered 2022-08-25 – 2022-08-26 (×5): 2 mg via INTRAVENOUS
  Filled 2022-08-25 (×5): qty 1

## 2022-08-25 MED ORDER — ONDANSETRON HCL 4 MG/2ML IJ SOLN
4.0000 mg | Freq: Once | INTRAMUSCULAR | Status: DC
  Filled 2022-08-25: qty 2

## 2022-08-25 MED ORDER — ONDANSETRON HCL 4 MG PO TABS: 4.0000 mg | ORAL_TABLET | Freq: Four times a day (QID) | ORAL | Status: DC | PRN

## 2022-08-25 MED ORDER — ACETAMINOPHEN 325 MG PO TABS
650.0000 mg | ORAL_TABLET | Freq: Four times a day (QID) | ORAL | Status: DC | PRN
  Administered 2022-08-25 – 2022-08-28 (×4): 650 mg via ORAL
  Filled 2022-08-25 (×4): qty 2

## 2022-08-25 NOTE — ED Notes (Signed)
Pt given ice water to drink, okay with Dr. Roxanne Mins pt may drink

## 2022-08-25 NOTE — Assessment & Plan Note (Signed)
Stable

## 2022-08-25 NOTE — Assessment & Plan Note (Signed)
Stable. Continue pindolol 2.5 mg bid.

## 2022-08-25 NOTE — ED Notes (Signed)
Dr. Roxanne Mins at the bedside to discuss results and POC

## 2022-08-25 NOTE — ED Provider Notes (Signed)
[place] DEPT Provider Note   CSN: [id number] Arrival date & time: 08/24/22  2130     History  Chief Complaint  Patient presents with   Abdominal Pain    Heather Olsen is a 88 y.o. female.  The history is provided by the patient.  Abdominal Pain She has history of hypertension, hyperlipidemia, coronary artery disease, aortic stenosis, mitral regurgitation, paroxysmal atrial fibrillation anticoagulated on apixaban, chronic kidney disease and comes in complaining of lower abdominal pain for the last 3 days.  Pain is crampy and does improve following passage of flatus.  She states that she has been anorectic and has not been eating much so she has not been having very many bowel movements.  She has had a cough for the last month which is nonproductive and associated with some nausea but no vomiting.  She denies fever, chills, sweats.  She denies urinary urgency, frequency, tenesmus, dysuria   Home Medications Prior to Admission medications   Medication Sig Start Date End Date Taking? Authorizing Provider  acetaminophen (TYLENOL) 650 MG CR tablet 2 tablets    [provider]  celecoxib (CELEBREX) 200 MG capsule TAKE 1 CAPSULE BY MOUTH EVERY DAY Patient not taking: Reported on 08/09/2022 12/16/16   Estill Dooms, MD  Cholecalciferol (VITAMIN D3) 2000 units capsule Take 2,000 Units by mouth daily.     [provider]  denosumab (PROLIA) 60 MG/ML SOSY injection 60 mg 07/25/19   [provider]  ELIQUIS 2.5 MG TABS tablet Take 2.5 mg by mouth 2 (two) times daily. Pt taking 2 mg twice daily 02/03/20   [provider]  escitalopram (LEXAPRO) 10 MG tablet Take 15 mg by mouth daily. Pt taking 1 1/4 tablet daily Patient not taking: Reported on 08/09/2022 12/26/19   [provider]  furosemide (LASIX) 20 MG tablet Take 1 tablet (20 mg total) by mouth daily. 0/73/71   Delora Fuel, MD  gabapentin (NEURONTIN) 300 MG capsule Take  300 mg by mouth at bedtime. Patient not taking: Reported on 08/09/2022    [provider]  ipratropium (ATROVENT) 0.06 % nasal spray Use 2 sprays in each nostril up to three times daily before meals for sinus drainage. Patient taking differently: Place 2 sprays into the nose daily. 09/30/16   Estill Dooms, MD  Lactobacillus-Inulin (CULTURELLE DIGESTIVE HEALTH PO) Take 1 capsule by mouth daily. Take one daily for probiotic    [provider]  LINZESS 145 MCG CAPS capsule Take 145 mcg by mouth every morning. 07/29/22   [provider]  metFORMIN (GLUCOPHAGE-XR) 500 MG 24 hr tablet Take 1 tablet by mouth daily. 02/01/22   [provider]  mirabegron ER (MYRBETRIQ) 50 MG TB24 tablet Take 1 tablet by mouth daily. 08/11/21   [provider]  MULTIPLE VITAMINS-MINERALS PO Take 1 tablet by mouth daily.     [provider]  nitrofurantoin (MACRODANTIN) 100 MG capsule Take 100 mg by mouth daily.    [provider]  nitroGLYCERIN (NITROSTAT) 0.4 MG SL tablet Place 1 tablet (0.4 mg total) under the tongue every 5 (five) minutes as needed for chest pain. Place one tablet under the tongue every 5 minutes as needed for chest pain. No more than 3 08/09/22   Minus Breeding, MD  ondansetron (ZOFRAN-ODT) 4 MG disintegrating tablet '4mg'$  ODT q4 hours prn nausea/vomit 07/11/22   Deno Etienne, DO  pantoprazole (PROTONIX) 40 MG tablet Take 40 mg by mouth daily. 12/18/19   [provider]  pindolol (VISKEN) 5 MG tablet Take 1/2 (one-half) tablet by mouth twice daily 07/06/22   Minus Breeding, MD  Probiotic Product (PROBIOTIC DAILY PO) Take by mouth.    [provider]  rOPINIRole (REQUIP) 0.5 MG tablet Take 2 tablets by mouth daily. 07/13/22   [provider]  rosuvastatin (CRESTOR) 10 MG tablet TAKE 1 TABLET BY MOUTH AT BEDTIME 11/23/20   Waynetta Sandy, MD  sertraline (ZOLOFT) 50 MG tablet Take 50 mg by mouth daily. 07/27/22    [provider]  traMADol (ULTRAM) 50 MG tablet TAKE 1 TABLET BY MOUTH EVERY 4 HOURS Patient taking differently: Take 50 mg by mouth every 4 (four) hours as needed for moderate pain or severe pain (pain). 11/14/16   Reed, Tiffany L, DO  trimethoprim (TRIMPEX) 100 MG tablet Take 100 mg by mouth daily. 09/10/21   [provider]  vitamin B-12 (CYANOCOBALAMIN) 1000 MCG tablet Take 1,000 mcg by mouth daily.    [provider]      Allergies    Topiramate, Ace inhibitors, Codeine, Ibandronic acid, Meloxicam, Prevacid [lansoprazole], Sulfa antibiotics, Verapamil, and Zolpidem    Review of Systems   Review of Systems  Gastrointestinal:  Positive for abdominal pain.  All other systems reviewed and are negative.   Physical Exam Updated Vital Signs BP (!) 141/75   Pulse 80   Temp 98.8 F (37.1 C) (Oral)   Resp 18   SpO2 94%  Physical Exam Vitals and nursing note reviewed.   88 year old female, resting comfortably and in no acute distress. Vital signs are significant for borderline elevated blood pressure. Oxygen saturation is 94%, which is normal. Head is normocephalic and atraumatic. PERRLA, EOMI. Oropharynx is clear. Neck is nontender and supple without adenopathy or JVD. Back is nontender and there is no CVA tenderness. Lungs are clear without rales, wheezes, or rhonchi. Chest is nontender. Heart has regular rate and rhythm without murmur. Abdomen is soft, flat, with mild to moderate tenderness diffusely, maximum tenderness in the suprapubic area.  There is no rebound or guarding.  Peristalsis is hypoactive. Extremities have no cyanosis or edema, full range of motion is present. Skin is warm and dry without rash. Neurologic: Mental status is normal, cranial nerves are intact, moves all extremities equally.  ED Results / Procedures / Treatments   Labs (all labs ordered are listed, but only abnormal results are displayed) Labs Reviewed  COMPREHENSIVE  METABOLIC PANEL  LIPASE, BLOOD  CBC WITH DIFFERENTIAL/PLATELET  URINALYSIS, ROUTINE W REFLEX MICROSCOPIC    EKG None  Radiology CT Abdomen Pelvis W Contrast  Result Date: 08/25/2022 CLINICAL DATA:  Abdominal pain for 1 month EXAM: CT ABDOMEN AND PELVIS WITH CONTRAST TECHNIQUE: Multidetector CT imaging of the abdomen and pelvis was performed using the standard protocol following bolus administration of intravenous contrast. RADIATION DOSE REDUCTION: This exam was performed according to the departmental dose-optimization program which includes automated exposure control, adjustment of the mA and/or kV according to patient size and/or use of iterative reconstruction technique. CONTRAST:  36m OMNIPAQUE IOHEXOL 300 MG/ML  SOLN COMPARISON:  CT abdomen and pelvis 07/11/2022 FINDINGS: Lower chest: Cardiomegaly. Marked basilar atelectasis/scarring. No acute abnormality. Hepatobiliary: No suspicious focal hepatic lesion. No evidence of cholecystitis. No gallstones. No biliary dilation. Pancreas: Unremarkable. Spleen: Unremarkable. Adrenals/Urinary Tract: Adrenal glands are unremarkable. Cortical renal scarring in both kidneys. Multiple small hypoattenuating lesions statistically likely to be cysts. No follow-up is required. No urinary calculi. Unchanged mild right hydroureter. Distended  bladder. Stomach/Bowel: Wall thickening and adjacent stranding about the sigmoid colon and rectum where there are numerous diverticula. Small amount of free fluid in the pelvis. No free intraperitoneal air. Normal caliber large and small bowel. Wall thickening of the stomach may be due to decompression though gastritis is not excluded. Vascular/Lymphatic: Aortic atherosclerosis. No enlarged abdominal or pelvic lymph nodes. Reproductive: Uterus and bilateral adnexa are unremarkable. Other: No abdominal wall hernia or abnormality. Musculoskeletal: Scoliosis. Degenerative changes in the hips and spine. No acute osseous abnormality.  IMPRESSION: Acute sigmoid diverticulitis. No drainable fluid collection or free intraperitoneal air. Proctitis may be reactive, secondary to diverticulitis. Aortic Atherosclerosis (ICD10-I70.0). Electronically Signed   By: Placido Sou M.D.   On: 08/25/2022 02:36   DG Abd Acute W/Chest  Result Date: 08/24/2022 CLINICAL DATA:  Abdominal pain EXAM: DG ABDOMEN ACUTE WITH 1 VIEW CHEST COMPARISON:  CT and chest x-ray 07/11/2022. FINDINGS: Heart is borderline in size. Aortic atherosclerosis. Lungs clear. No effusions. Severe thoracolumbar scoliosis and degenerative changes. Nonobstructive bowel gas pattern. No organomegaly, free air or suspicious calcification. IMPRESSION: No acute findings. Electronically Signed   By: Rolm Baptise M.D.   On: 08/24/2022 23:59   ECHOCARDIOGRAM COMPLETE  Result Date: 08/23/2022    ECHOCARDIOGRAM REPORT   Patient Name:   Heather Olsen Date of Exam: 08/23/2022 Medical Rec #:  [id number]   Height:       64.0 in Accession #:    [id number]  Weight:       110.4 lb Date of Birth:  19-Jun-1931   BSA:          1.520 m Patient Age:    46 years    BP:           118/72 mmHg Patient Gender: F           HR:           66 bpm. Exam Location:  Outpatient Procedure: 2D Echo, 3D Echo, Cardiac Doppler, Color Doppler and Strain Analysis Indications:    shortness of breath  History:        Patient has prior history of Echocardiogram examinations, most                 recent 08/13/2020. CAD, Arrythmias:Atrial Fibrillation; Risk                 Factors:Dyslipidemia, Non-Smoker and Diabetes.  Sonographer:    Leavy Cella RDCS Referring Phys: [facility]  1. Left ventricular ejection fraction, by estimation, is 60 to 65%. The left ventricle has normal function. The left ventricle has no regional wall motion abnormalities. Left ventricular diastolic parameters are consistent with Grade II diastolic dysfunction (pseudonormalization). Elevated left atrial pressure. The average left  ventricular global longitudinal strain is -16.4 %. The global longitudinal strain is abnormal.  2. Right ventricular systolic function is normal. The right ventricular size is normal.  3. Left atrial size was mildly dilated.  4. Right atrial size was moderately dilated.  5. The mitral valve is normal in structure. No evidence of mitral valve regurgitation. No evidence of mitral stenosis.  6. Tricuspid valve regurgitation is moderate.  7. The aortic valve is tricuspid. Aortic valve regurgitation is not visualized. No aortic stenosis is present.  8. Aortic dilatation noted. There is mild dilatation of the ascending aorta, measuring 40 mm.  9. The inferior vena cava is normal in size with greater than 50% respiratory variability, suggesting right atrial pressure of 3 mmHg. FINDINGS  Left  Ventricle: Left ventricular ejection fraction, by estimation, is 60 to 65%. The left ventricle has normal function. The left ventricle has no regional wall motion abnormalities. The average left ventricular global longitudinal strain is -16.4 %. The global longitudinal strain is abnormal. The left ventricular internal cavity size was normal in size. There is no left ventricular hypertrophy. Left ventricular diastolic parameters are consistent with Grade II diastolic dysfunction (pseudonormalization). Elevated left atrial pressure. Right Ventricle: The right ventricular size is normal. Right ventricular systolic function is normal. Left Atrium: Left atrial size was mildly dilated. Right Atrium: Right atrial size was moderately dilated. Pericardium: There is no evidence of pericardial effusion. Mitral Valve: The mitral valve is normal in structure. No evidence of mitral valve regurgitation. No evidence of mitral valve stenosis. Tricuspid Valve: The tricuspid valve is normal in structure. Tricuspid valve regurgitation is moderate . No evidence of tricuspid stenosis. Aortic Valve: The aortic valve is tricuspid. Aortic valve regurgitation  is not visualized. No aortic stenosis is present. Pulmonic Valve: The pulmonic valve was normal in structure. Pulmonic valve regurgitation is not visualized. No evidence of pulmonic stenosis. Aorta: Aortic dilatation noted. There is mild dilatation of the ascending aorta, measuring 40 mm. Venous: The inferior vena cava is normal in size with greater than 50% respiratory variability, suggesting right atrial pressure of 3 mmHg. IAS/Shunts: No atrial level shunt detected by color flow Doppler.  LEFT VENTRICLE PLAX 2D LVIDd:         3.03 cm   Diastology LVIDs:         1.69 cm   LV e' medial:    4.13 cm/s LV PW:         1.16 cm   LV E/e' medial:  27.4 LV IVS:        0.89 cm   LV e' lateral:   6.96 cm/s LVOT diam:     1.80 cm   LV E/e' lateral: 16.2 LV SV:         55 LV SV Index:   37        2D Longitudinal Strain LVOT Area:     2.54 cm  2D Strain GLS Avg:     -16.4 %                           3D Volume EF:                          3D EF:        56 %                          LV EDV:       74 ml                          LV ESV:       33 ml                          LV SV:        41 ml RIGHT VENTRICLE RV Basal diam:  4.01 cm RV Mid diam:    3.40 cm RV S prime:     14.30 cm/s TAPSE (M-mode): 2.1 cm LEFT ATRIUM           Index        RIGHT ATRIUM  Index LA diam:      2.00 cm 1.32 cm/m   RA Area:     22.90 cm LA Vol (A2C): 20.7 ml 13.62 ml/m  RA Volume:   72.00 ml  47.38 ml/m LA Vol (A4C): 43.7 ml 28.75 ml/m  AORTIC VALVE LVOT Vmax:   88.70 cm/s LVOT Vmean:  57.500 cm/s LVOT VTI:    0.218 m  AORTA Ao Root diam: 3.10 cm Ao Asc diam:  4.00 cm MITRAL VALVE                TRICUSPID VALVE MV Area (PHT): 4.21 cm     TR Peak grad:   32.9 mmHg MV Decel Time: 180 msec     TR Vmax:        287.00 cm/s MV E velocity: 113.00 cm/s MV A velocity: 89.70 cm/s   SHUNTS MV E/A ratio:  1.26         Systemic VTI:  0.22 m                             Systemic Diam: 1.80 cm Kirk Ruths MD Electronically signed by Kirk Ruths MD  Signature Date/Time: 08/23/2022/3:34:07 PM    Final     Procedures Procedures    Medications Ordered in ED Medications  lactated ringers bolus 1,000 mL (has no administration in time range)  ondansetron (ZOFRAN) injection 4 mg (has no administration in time range)    ED Course/ Medical Decision Making/ A&P                           Medical Decision Making Amount and/or Complexity of Data Reviewed Labs: ordered.  Risk Prescription drug management. Decision regarding hospitalization.   Abdominal pain of uncertain cause.  Differential diagnosis is broad, and includes, but is not limited to, diverticulitis, urin infection, pyelonephritis, urolithiasis with renal colic, mesenteric ischemia, abdominal aortic aneurysm.  Although she is tender, exam is generally benign.  I have ordered laboratory testing of CBC, comprehensive metabolic panel, lipase, urinalysis.  Abdominal x-rays show no acute process.  I independently viewed the images, and agree with the radiologist's interpretation.  I have ordered a CT of abdomen and pelvis to further evaluate for intra-abdominal pathology.  I have reviewed her old records, and she had an ED visit for nausea and vomiting on 07/11/2022 at which time CT of abdomen and pelvis showed presence of diverticulosis but no diverticulitis, no evidence of abdominal aortic aneurysm or nephrolithiasis.  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  I have reviewed her laboratory tests, my interpretation is mild hyponatremia, elevated random glucose likely stress related, borderline hypoalbuminemia, mild leukocytosis with increased percentage of immature cells, macrocytic anemia which is stable.  CT scan today shows presence of sigmoid diverticulitis without complication.  I have independently viewed the images, and agree with radiologist's interpretation.  On return from CT scan, patient is requesting pain medication and I have ordered a dose of morphine.  I have  ordered antibiotics of ciprofloxacin and metronidazole.  Case discussed with Dr. Bridgett Larsson of [facility], who agrees to admit the patient.  Final Clinical Impression(s) / ED Diagnoses Final diagnoses:  Sigmoid diverticulitis  Macrocytic anemia  Hyponatremia  Elevated random blood glucose level    Rx / DC Orders ED Discharge Orders     None         Delora Fuel, MD 67/89/38  [id number]  

## 2022-08-25 NOTE — ED Notes (Signed)
Pt ambulated to BR with steady gait, no assist needed

## 2022-08-25 NOTE — ED Notes (Signed)
Pt given additional warm blanket, advised unable to adjust temp to her room as there is not a thermostat in her room. Pt voiced understanding.

## 2022-08-25 NOTE — ED Notes (Signed)
Dr. Roxanne Mins notified pt having abd pain

## 2022-08-25 NOTE — Assessment & Plan Note (Signed)
Add SSI. Hold metformin. 

## 2022-08-25 NOTE — Assessment & Plan Note (Signed)
Observation medical bed. IV rocephin/po flagyl. Prn IV morphine for severe pain, po norco prn for moderate pain. Clear liquid diet. Prn IV zofran for nausea.

## 2022-08-25 NOTE — ED Notes (Signed)
Pt. Made aware for the need of urine specimen. 

## 2022-08-25 NOTE — H&P (Signed)
History and Physical    Heather Olsen WJX:[id number] DOB: 1931-01-20 DOA: 08/24/2022  DOS: the patient was seen and examined on 08/24/2022  PCP: Deland Pretty, MD   Patient coming from: Home  I have personally briefly reviewed patient's old medical records in [place]  CC: abd pain, nausea HPI: 88 year old female history of CKD stage IIIa, type 2 diabetes, coronary disease, hyperlipidemia, paroxysmal atrial fibrillation on Eliquis Zentz to the ER today with abdominal pain and nausea.  She has had several weeks of nausea.  She was getting ready to have an upper endoscopy at [facility].  She thought this initially was an esophagus problem.  Her outpatient EGD was canceled due to shortness of breath.  She was referred to her cardiologist.  She had echocardiogram yesterday which showed normal LV function.  Over the last 3 days, she has had increasing nausea.  She has not had any vomiting.  She has had belly pain.  This is mostly left lower quadrant.  She denies any fever or chills.  She lives at [facility] in independent living.  CT scan of the abdomen pelvis demonstrated sigmoid diverticulitis.  White count of 13.6.  [facility] contacted for admission.    ED Course: CT abd showed sigmoid diverticulitis  Review of Systems:  Review of Systems  Constitutional: Negative.   HENT: Negative.    Eyes: Negative.   Respiratory: Negative.    Cardiovascular: Negative.   Gastrointestinal:  Positive for abdominal pain, constipation and nausea. Negative for vomiting.  Genitourinary: Negative.   Musculoskeletal: Negative.   Skin: Negative.   Neurological: Negative.   Endo/Heme/Allergies: Negative.   Psychiatric/Behavioral: Negative.    All other systems reviewed and are negative.   Past Medical History:  Diagnosis Date   Aortic stenosis 04/12/2016   none noted on echo done 08/13/20   Coronary artery disease    Depression    Diverticulosis    Hearing loss 04/12/2016    Hiatal hernia 04/12/2016   HLD (hyperlipidemia) 04/12/2016   Hyperglycemia 09/14/2016   Hypertension    Idiopathic scoliosis 04/12/2016   Major depression 04/12/2016   Mitral regurgitation    Osteoporosis, senile 04/12/2016   Pernicious anemia    Raynaud's disease 04/12/2016   Scoliosis    Spinal stenosis of lumbar region 04/12/2016    Past Surgical History:  Procedure Laterality Date   APPENDECTOMY     BREAST SURGERY     broken wrist Right 2007   CARPAL TUNNEL RELEASE Left 04/04/2017   Procedure: LEFT CARPAL TUNNEL RELEASE;  Surgeon: Daryll Brod, MD;  Location: [place];  Service: Orthopedics;  Laterality: Left;  REG/FAB   CARPAL TUNNEL RELEASE Right 12/01/2020   Procedure: CARPAL TUNNEL RELEASE;  Surgeon: Daryll Brod, MD;  Location: [place];  Service: Orthopedics;  Laterality: Right;  IV REGIONAL FOREARM BLOCK   CATARACT EXTRACTION W/ INTRAOCULAR LENS  IMPLANT, BILATERAL Bilateral 2017   COLONOSCOPY  2016   FOOT SURGERY Right    PERIPHERAL VASCULAR INTERVENTION  11/11/2019   Procedure: PERIPHERAL VASCULAR INTERVENTION;  Surgeon: Waynetta Sandy, MD;  Location: [place] [facility];  Service: Cardiovascular;;   TONSILLECTOMY     VISCERAL ANGIOGRAPHY N/A 11/11/2019   Procedure: VISCERAL ANGIOGRAPHY;  Surgeon: Waynetta Sandy, MD;  Location: [place] [facility];  Service: Cardiovascular;  Laterality: N/A;     reports that she has never smoked. She has never been exposed to tobacco smoke. She has never used smokeless tobacco. She reports  current alcohol use. She reports that she does not use drugs.  Allergies  Allergen Reactions   Topiramate Rash   Ace Inhibitors Cough   Codeine Nausea Only   Ibandronic Acid Nausea Only   Meloxicam     Other reaction(s): GI upset, GI Upset (intolerance), Other (See Comments) unknown    Prevacid [Lansoprazole]     Chest pain   Sulfa Antibiotics Other (See Comments)    headaches   Verapamil Other  (See Comments)    constipation   Zolpidem Other (See Comments)    hallucinations    Family History  Problem Relation Age of Onset   Heart disease Mother        Died 92, MR, no CAD   Heart disease Father        CHF, no CAD.[name]    HIV Daughter 89       from bone tissue transplant    Prior to Admission medications   Medication Sig Start Date End Date Taking? Authorizing Provider  acetaminophen (TYLENOL) 500 MG tablet Take 500-1,000 mg by mouth every 6 (six) hours as needed for moderate pain.   Yes [provider]  Cholecalciferol (VITAMIN D3) 2000 units capsule Take 2,000 Units by mouth daily.    Yes [provider]  denosumab (PROLIA) 60 MG/ML SOSY injection Inject 60 mg into the skin every 6 (six) months. 07/25/19  Yes [provider]  ELIQUIS 2.5 MG TABS tablet Take 2.5 mg by mouth 2 (two) times daily. 02/03/20  Yes [provider]  famotidine (PEPCID) 40 MG tablet Take 40 mg by mouth at bedtime. 08/06/22  Yes [provider]  furosemide (LASIX) 20 MG tablet Take 1 tablet (20 mg total) by mouth daily. 0/92/33  Yes Delora Fuel, MD  ipratropium (ATROVENT) 0.06 % nasal spray Use 2 sprays in each nostril up to three times daily before meals for sinus drainage. Patient taking differently: Place 2 sprays into the nose daily. 09/30/16  Yes Estill Dooms, MD  Lactobacillus-Inulin ([place] PO) Take 1 capsule by mouth every evening. Take one daily for probiotic   Yes [provider]  LINZESS 145 MCG CAPS capsule Take 145 mcg by mouth daily as needed (constipation). 07/29/22  Yes [provider]  metFORMIN (GLUCOPHAGE-XR) 500 MG 24 hr tablet Take 1 tablet by mouth every evening. 02/01/22  Yes [provider]  mirabegron ER (MYRBETRIQ) 50 MG TB24 tablet Take 1 tablet by mouth daily. 08/11/21  Yes [provider]  MULTIPLE VITAMINS-MINERALS PO Take 1 tablet by mouth daily.    Yes [provider]   neomycin-polymyxin b-dexamethasone (MAXITROL) 3.5-10000-0.1 OINT Place into the left eye 2 (two) times daily. 08/10/22  Yes [provider]  nitrofurantoin, macrocrystal-monohydrate, (MACROBID) 100 MG capsule Take 100 mg by mouth daily. 08/17/22  Yes [provider]  nitroGLYCERIN (NITROSTAT) 0.4 MG SL tablet Place 1 tablet (0.4 mg total) under the tongue every 5 (five) minutes as needed for chest pain. Place one tablet under the tongue every 5 minutes as needed for chest pain. No more than 3 08/09/22  Yes Hochrein, Jeneen Rinks, MD  ondansetron (ZOFRAN) 4 MG tablet Take 4 mg by mouth every 8 (eight) hours as needed for nausea or vomiting.   Yes [provider]  pantoprazole (PROTONIX) 40 MG tablet Take 40 mg by mouth 2 (two) times daily. 12/18/19  Yes [provider]  pindolol (VISKEN) 5 MG tablet Take 1/2 (one-half) tablet by mouth twice daily Patient  taking differently: Take 2.5 mg by mouth 2 (two) times daily. 07/06/22  Yes Minus Breeding, MD  Probiotic Product (PROBIOTIC DAILY PO) Take 1 capsule by mouth every evening.   Yes [provider]  rOPINIRole (REQUIP) 0.5 MG tablet Take 0.5 mg by mouth at bedtime. 07/13/22  Yes [provider]  rosuvastatin (CRESTOR) 10 MG tablet TAKE 1 TABLET BY MOUTH AT BEDTIME 11/23/20  Yes Waynetta Sandy, MD  sertraline (ZOLOFT) 25 MG tablet Take 25 mg by mouth at bedtime. 08/10/22  Yes [provider]  traMADol (ULTRAM) 50 MG tablet TAKE 1 TABLET BY MOUTH EVERY 4 HOURS Patient taking differently: Take 50 mg by mouth every 12 (twelve) hours as needed for moderate pain or severe pain (pain). 11/14/16  Yes Reed, Tiffany L, DO  celecoxib (CELEBREX) 200 MG capsule TAKE 1 CAPSULE BY MOUTH EVERY DAY Patient not taking: Reported on 08/09/2022 12/16/16   Estill Dooms, MD  ondansetron (ZOFRAN-ODT) 4 MG disintegrating tablet '4mg'$  ODT q4 hours prn nausea/vomit Patient not taking: Reported on 08/25/2022 07/11/22    Deno Etienne, DO    Physical Exam: Vitals:   08/25/22 0300 08/25/22 0313 08/25/22 0330 08/25/22 0400  BP: (!) 154/74  (!) 147/79 118/70  Pulse: 85  90 80  Resp: '16  16 14  '$ Temp:  99.1 F (37.3 C)    TempSrc:  Oral    SpO2: 98%  93% 96%    Physical Exam Vitals and nursing note reviewed.  Constitutional:      General: She is not in acute distress.    Appearance: Normal appearance. She is normal weight. She is not ill-appearing, toxic-appearing or diaphoretic.  HENT:     Head: Normocephalic and atraumatic.     Nose: Nose normal.  Eyes:     General: No scleral icterus. Cardiovascular:     Rate and Rhythm: Normal rate and regular rhythm.     Pulses: Normal pulses.  Pulmonary:     Effort: Pulmonary effort is normal. No respiratory distress.     Breath sounds: Normal breath sounds. No wheezing or rales.  Abdominal:     General: Abdomen is flat. Bowel sounds are normal. There is no distension.     Palpations: Abdomen is soft.     Tenderness: There is abdominal tenderness in the left lower quadrant. There is no guarding or rebound.  Musculoskeletal:     Right lower leg: No edema.     Left lower leg: No edema.  Skin:    General: Skin is warm and dry.     Capillary Refill: Capillary refill takes less than 2 seconds.  Neurological:     General: No focal deficit present.     Mental Status: She is alert and oriented to person, place, and time.      Labs on Admission: I have personally reviewed following labs and imaging studies  CBC: Recent Labs  Lab 08/24/22 2325  WBC 13.6*  NEUTROABS 10.7*  HGB 10.8*  HCT 32.9*  MCV 102.2*  PLT 400   Basic Metabolic Panel: Recent Labs  Lab 08/24/22 2325  NA 133*  K 3.5  CL 98  CO2 26  GLUCOSE 172*  BUN 19  CREATININE 0.85  CALCIUM 9.4   GFR: CrCl cannot be calculated (Unknown ideal weight.). Liver Function Tests: Recent Labs  Lab 08/24/22 2325  AST 21  ALT 14  ALKPHOS 80  BILITOT 0.8  PROT 6.5  ALBUMIN 3.4*    Recent Labs  Lab 08/24/22  2325  LIPASE 27   No results for input(s): "AMMONIA" in the last 168 hours. Coagulation Profile: No results for input(s): "INR", "PROTIME" in the last 168 hours. Cardiac Enzymes: No results for input(s): "CKTOTAL", "CKMB", "CKMBINDEX", "TROPONINI", "TROPONINIHS" in the last 168 hours. BNP (last 3 results) No results for input(s): "PROBNP" in the last 8760 hours. HbA1C: No results for input(s): "HGBA1C" in the last 72 hours. CBG: No results for input(s): "GLUCAP" in the last 168 hours. Lipid Profile: No results for input(s): "CHOL", "HDL", "LDLCALC", "TRIG", "CHOLHDL", "LDLDIRECT" in the last 72 hours. Thyroid Function Tests: No results for input(s): "TSH", "T4TOTAL", "FREET4", "T3FREE", "THYROIDAB" in the last 72 hours. Anemia Panel: No results for input(s): "VITAMINB12", "FOLATE", "FERRITIN", "TIBC", "IRON", "RETICCTPCT" in the last 72 hours. Urine analysis:    Component Value Date/Time   COLORURINE YELLOW 08/24/2022 2325   APPEARANCEUR CLEAR 08/24/2022 2325   LABSPEC 1.012 08/24/2022 2325   PHURINE 7.0 08/24/2022 2325   GLUCOSEU NEGATIVE 08/24/2022 2325   HGBUR NEGATIVE 08/24/2022 2325   BILIRUBINUR NEGATIVE 08/24/2022 2325   KETONESUR NEGATIVE 08/24/2022 2325   PROTEINUR NEGATIVE 08/24/2022 2325   NITRITE NEGATIVE 08/24/2022 2325   LEUKOCYTESUR NEGATIVE 08/24/2022 2325    Radiological Exams on Admission: I have personally reviewed images CT Abdomen Pelvis W Contrast  Result Date: 08/25/2022 CLINICAL DATA:  Abdominal pain for 1 month EXAM: CT ABDOMEN AND PELVIS WITH CONTRAST TECHNIQUE: Multidetector CT imaging of the abdomen and pelvis was performed using the standard protocol following bolus administration of intravenous contrast. RADIATION DOSE REDUCTION: This exam was performed according to the departmental dose-optimization program which includes automated exposure control, adjustment of the mA and/or kV according to patient size and/or use  of iterative reconstruction technique. CONTRAST:  42m OMNIPAQUE IOHEXOL 300 MG/ML  SOLN COMPARISON:  CT abdomen and pelvis 07/11/2022 FINDINGS: Lower chest: Cardiomegaly. Marked basilar atelectasis/scarring. No acute abnormality. Hepatobiliary: No suspicious focal hepatic lesion. No evidence of cholecystitis. No gallstones. No biliary dilation. Pancreas: Unremarkable. Spleen: Unremarkable. Adrenals/Urinary Tract: Adrenal glands are unremarkable. Cortical renal scarring in both kidneys. Multiple small hypoattenuating lesions statistically likely to be cysts. No follow-up is required. No urinary calculi. Unchanged mild right hydroureter. Distended bladder. Stomach/Bowel: Wall thickening and adjacent stranding about the sigmoid colon and rectum where there are numerous diverticula. Small amount of free fluid in the pelvis. No free intraperitoneal air. Normal caliber large and small bowel. Wall thickening of the stomach may be due to decompression though gastritis is not excluded. Vascular/Lymphatic: Aortic atherosclerosis. No enlarged abdominal or pelvic lymph nodes. Reproductive: Uterus and bilateral adnexa are unremarkable. Other: No abdominal wall hernia or abnormality. Musculoskeletal: Scoliosis. Degenerative changes in the hips and spine. No acute osseous abnormality. IMPRESSION: Acute sigmoid diverticulitis. No drainable fluid collection or free intraperitoneal air. Proctitis may be reactive, secondary to diverticulitis. Aortic Atherosclerosis (ICD10-I70.0). Electronically Signed   By: TPlacido SouM.D.   On: 08/25/2022 02:36   DG Abd Acute W/Chest  Result Date: 08/24/2022 CLINICAL DATA:  Abdominal pain EXAM: DG ABDOMEN ACUTE WITH 1 VIEW CHEST COMPARISON:  CT and chest x-ray 07/11/2022. FINDINGS: Heart is borderline in size. Aortic atherosclerosis. Lungs clear. No effusions. Severe thoracolumbar scoliosis and degenerative changes. Nonobstructive bowel gas pattern. No organomegaly, free air or suspicious  calcification. IMPRESSION: No acute findings. Electronically Signed   By: KRolm BaptiseM.D.   On: 08/24/2022 23:59   ECHOCARDIOGRAM COMPLETE  Result Date: 08/23/2022    ECHOCARDIOGRAM REPORT   Patient Name:   JTREANNAMCNEIL Date  of Exam: 08/23/2022 Medical Rec #:  [id number]   Height:       64.0 in Accession #:    [id number]  Weight:       110.4 lb Date of Birth:  Jan 16, 1931   BSA:          1.520 m Patient Age:    89 years    BP:           118/72 mmHg Patient Gender: F           HR:           66 bpm. Exam Location:  Outpatient Procedure: 2D Echo, 3D Echo, Cardiac Doppler, Color Doppler and Strain Analysis Indications:    shortness of breath  History:        Patient has prior history of Echocardiogram examinations, most                 recent 08/13/2020. CAD, Arrythmias:Atrial Fibrillation; Risk                 Factors:Dyslipidemia, Non-Smoker and Diabetes.  Sonographer:    Leavy Cella RDCS Referring Phys: [place]  1. Left ventricular ejection fraction, by estimation, is 60 to 65%. The left ventricle has normal function. The left ventricle has no regional wall motion abnormalities. Left ventricular diastolic parameters are consistent with Grade II diastolic dysfunction (pseudonormalization). Elevated left atrial pressure. The average left ventricular global longitudinal strain is -16.4 %. The global longitudinal strain is abnormal.  2. Right ventricular systolic function is normal. The right ventricular size is normal.  3. Left atrial size was mildly dilated.  4. Right atrial size was moderately dilated.  5. The mitral valve is normal in structure. No evidence of mitral valve regurgitation. No evidence of mitral stenosis.  6. Tricuspid valve regurgitation is moderate.  7. The aortic valve is tricuspid. Aortic valve regurgitation is not visualized. No aortic stenosis is present.  8. Aortic dilatation noted. There is mild dilatation of the ascending aorta, measuring 40 mm.  9. The inferior  vena cava is normal in size with greater than 50% respiratory variability, suggesting right atrial pressure of 3 mmHg. FINDINGS  Left Ventricle: Left ventricular ejection fraction, by estimation, is 60 to 65%. The left ventricle has normal function. The left ventricle has no regional wall motion abnormalities. The average left ventricular global longitudinal strain is -16.4 %. The global longitudinal strain is abnormal. The left ventricular internal cavity size was normal in size. There is no left ventricular hypertrophy. Left ventricular diastolic parameters are consistent with Grade II diastolic dysfunction (pseudonormalization). Elevated left atrial pressure. Right Ventricle: The right ventricular size is normal. Right ventricular systolic function is normal. Left Atrium: Left atrial size was mildly dilated. Right Atrium: Right atrial size was moderately dilated. Pericardium: There is no evidence of pericardial effusion. Mitral Valve: The mitral valve is normal in structure. No evidence of mitral valve regurgitation. No evidence of mitral valve stenosis. Tricuspid Valve: The tricuspid valve is normal in structure. Tricuspid valve regurgitation is moderate . No evidence of tricuspid stenosis. Aortic Valve: The aortic valve is tricuspid. Aortic valve regurgitation is not visualized. No aortic stenosis is present. Pulmonic Valve: The pulmonic valve was normal in structure. Pulmonic valve regurgitation is not visualized. No evidence of pulmonic stenosis. Aorta: Aortic dilatation noted. There is mild dilatation of the ascending aorta, measuring 40 mm. Venous: The inferior vena cava is normal in size with greater than 50% respiratory variability, suggesting right atrial  pressure of 3 mmHg. IAS/Shunts: No atrial level shunt detected by color flow Doppler.  LEFT VENTRICLE PLAX 2D LVIDd:         3.03 cm   Diastology LVIDs:         1.69 cm   LV e' medial:    4.13 cm/s LV PW:         1.16 cm   LV E/e' medial:  27.4 LV IVS:         0.89 cm   LV e' lateral:   6.96 cm/s LVOT diam:     1.80 cm   LV E/e' lateral: 16.2 LV SV:         55 LV SV Index:   37        2D Longitudinal Strain LVOT Area:     2.54 cm  2D Strain GLS Avg:     -16.4 %                           3D Volume EF:                          3D EF:        56 %                          LV EDV:       74 ml                          LV ESV:       33 ml                          LV SV:        41 ml RIGHT VENTRICLE RV Basal diam:  4.01 cm RV Mid diam:    3.40 cm RV S prime:     14.30 cm/s TAPSE (M-mode): 2.1 cm LEFT ATRIUM           Index        RIGHT ATRIUM           Index LA diam:      2.00 cm 1.32 cm/m   RA Area:     22.90 cm LA Vol (A2C): 20.7 ml 13.62 ml/m  RA Volume:   72.00 ml  47.38 ml/m LA Vol (A4C): 43.7 ml 28.75 ml/m  AORTIC VALVE LVOT Vmax:   88.70 cm/s LVOT Vmean:  57.500 cm/s LVOT VTI:    0.218 m  AORTA Ao Root diam: 3.10 cm Ao Asc diam:  4.00 cm MITRAL VALVE                TRICUSPID VALVE MV Area (PHT): 4.21 cm     TR Peak grad:   32.9 mmHg MV Decel Time: 180 msec     TR Vmax:        287.00 cm/s MV E velocity: 113.00 cm/s MV A velocity: 89.70 cm/s   SHUNTS MV E/A ratio:  1.26         Systemic VTI:  0.22 m                             Systemic Diam: 1.80 cm Kirk Ruths MD Electronically signed by Kirk Ruths MD Signature Date/Time: 08/23/2022/3:34:07 PM  Final     EKG: My personal interpretation of EKG shows: no EKG  Assessment/Plan Principal Problem:   Acute diverticulitis Active Problems:   HLD (hyperlipidemia)   Coronary artery disease involving native coronary artery of native heart without angina pectoris   PAF (paroxysmal atrial fibrillation) (HCC)   Type 2 diabetes mellitus with unspecified complications (HCC)   Chronic kidney disease, stage 3a ([place])   Long term (current) use of anticoagulants    Assessment and Plan: * Acute diverticulitis Observation medical bed. IV rocephin/po flagyl. Prn IV morphine for severe pain, po norco prn for  moderate pain. Clear liquid diet. Prn IV zofran for nausea.  Long term (current) use of anticoagulants Continue Eliquis 2.5 mg bid. On pindolol at home.  Chronic kidney disease, stage 3a (HCC) Stable.  Type 2 diabetes mellitus with unspecified complications (HCC) Add SSI. Hold metformin.  PAF (paroxysmal atrial fibrillation) (HCC) Stable. Continue pindolol 2.5 mg bid.  Coronary artery disease involving native coronary artery of native heart without angina pectoris Stable. Continue ASA 81 mg daily.  HLD (hyperlipidemia) Stable. Continue crestor 10 mg qd   DVT prophylaxis: Eliquis Code Status: Full Code Family Communication: no family at bedside  Disposition Plan: return home  Consults called: none  Admission status: Observation, Med-Surg   Kristopher Oppenheim, DO [facility] 08/25/2022, 4:50 AM

## 2022-08-25 NOTE — ED Notes (Signed)
Pt appears to be sleeping with husband at the bedside, observe even RR and unlabored, NAD noted, side rails up x2 for safety, plan of care ongoing, call light within reach, no further concerns as of present.

## 2022-08-25 NOTE — Assessment & Plan Note (Signed)
Stable. Continue crestor 10 mg qd

## 2022-08-25 NOTE — Subjective & Objective (Signed)
CC: abd pain, nausea HPI: 88 year old female history of CKD stage IIIa, type 2 diabetes, coronary disease, hyperlipidemia, paroxysmal atrial fibrillation on Eliquis Zentz to the ER today with abdominal pain and nausea.  She has had several weeks of nausea.  She was getting ready to have an upper endoscopy at [facility].  She thought this initially was an esophagus problem.  Her outpatient EGD was canceled due to shortness of breath.  She was referred to her cardiologist.  She had echocardiogram yesterday which showed normal LV function.  Over the last 3 days, she has had increasing nausea.  She has not had any vomiting.  She has had belly pain.  This is mostly left lower quadrant.  She denies any fever or chills.  [facility] lives at [facility] in independent living.  CT scan of the abdomen pelvis demonstrated sigmoid diverticulitis.  White count of 13.6.  [facility] hospital[facility] contacted for admission.

## 2022-08-25 NOTE — Progress Notes (Signed)
PROGRESS NOTE  Heather Olsen QMG:[id number] DOB: 03-07-1931   PCP: Deland Pretty, MD  Patient is from: Home.  DOA: 08/24/2022 LOS: 0  Chief complaints Chief Complaint  Patient presents with   Abdominal Pain     Brief Narrative / Interim history: 88 year old F with PMH of CKD-3A, DM-2, CAD, paroxysmal A-fib on Eliquis, HLD and remote history of diverticulitis presenting with nausea and LLQ pain, and found to have acute sigmoid diverticulitis without complication.  She was started on IV ceftriaxone and Flagyl.   Subjective: Seen and examined earlier this morning.  She reports an episode of emesis about 2 to 3 hours ago.  Reports some nausea.  Abdominal pain improved.  Denies UTI symptoms.  Last bowel movement yesterday morning.  Denies melena or hematochezia.  Reports about a 7 pound weight loss in the last 1 month due to poor appetite.  She is followed by digestive health GI group with [facility].  Objective: Vitals:   08/25/22 0800 08/25/22 0830 08/25/22 0930 08/25/22 1123  BP: 132/65 (!) 141/78  103/61  Pulse: 87 79  62  Resp: 16   16  Temp: (!) 100.4 F (38 C)  99.6 F (37.6 C) 98.9 F (37.2 C)  TempSrc: Oral   Oral  SpO2: 97% 99%  92%    Examination:  GENERAL: No apparent distress.  Nontoxic. HEENT: MMM.  Vision and hearing grossly intact.  NECK: Supple.  No apparent JVD.  RESP:  No IWOB.  Fair aeration bilaterally. CVS:  RRR. Heart sounds normal.  ABD/GI/GU: BS+. Abd soft, NTND.  MSK/EXT:  Moves extremities. No apparent deformity.  Trace BLE edema. SKIN: no apparent skin lesion or wound NEURO: Awake, alert and oriented appropriately.  No apparent focal neuro deficit. PSYCH: Calm. Normal affect.   Procedures:  None  Microbiology summarized: None  Assessment and plan: Principal Problem:   Acute diverticulitis Active Problems:   HLD (hyperlipidemia)   Coronary artery disease involving native coronary artery of native heart without angina pectoris   PAF  (paroxysmal atrial fibrillation) (HCC)   Type 2 diabetes mellitus with unspecified complications (HCC)   Chronic kidney disease, stage 3a ([place])   Long term (current) use of anticoagulants  Acute sigmoid diverticulitis: Remote history of diverticulitis.  No perforation or abscess on CT.  Pain improving but ongoing nausea and vomiting. -Continue IV Rocephin and Flagyl -Continue IV fluid and antiemetics -Advance to full liquid diet.   Paroxysmal A-fib: Rate controlled. Long term (current) use of anticoagulants -Continue pindolol 2.5 mg twice daily -Continue Eliquis 2.5 mg twice daily  Chronic diastolic CHF: TTE on 02/21/7123 with LVEF of 60 to 65%, G2 DD and moderate TVR.  No cardiopulmonary symptoms but trace edema in BLE.  Seems to be on p.o. Lasix 20 mg daily at home -Hold diuretics -Monitor intake and output  Controlled NIDDM-2 with HLD: A1c 6.5% in 2017.  Seems to be on low-dose metformin at home. -Check hemoglobin A1c -Continue SSI-sensitive  History of CAD: No cardiopulmonary symptoms. -Continue aspirin and statin  Mood disorder -Continue home medications  Chronic constipation: -Hold home Linzess for now  CKD ruled out.  Unintentional weight loss: Attributes to poor p.o. intake.  Seems like she has history of Raynaud's disease.  Followed by digestive health at [facility] for EGD at some point -Outpatient follow-up.  There is no height or weight on file to calculate BMI.           DVT prophylaxis:  apixaban (ELIQUIS) tablet 2.5 mg  Start: 08/25/22 1000 SCDs Start: 08/25/22 0810 apixaban (ELIQUIS) tablet 2.5 mg  Code Status: Full code Family Communication: None at bedside Level of care: Med-Surg Status is: Observation The patient will require care spanning > 2 midnights and should be moved to inpatient because: Acute diverticulitis requiring IV antibiotics in the setting of ongoing nausea and vomiting   Final disposition: TBD Consultants:  None  Sch  Meds:  Scheduled Meds:  apixaban  2.5 mg Oral BID   aspirin EC  81 mg Oral Daily   famotidine  40 mg Oral QHS   insulin aspart  0-5 Units Subcutaneous QHS   insulin aspart  0-9 Units Subcutaneous TID WC   metroNIDAZOLE  500 mg Oral Q12H   mirabegron ER  50 mg Oral Daily   pantoprazole  40 mg Oral BID   pindolol  2.5 mg Oral BID   rOPINIRole  0.5 mg Oral QHS   rosuvastatin  10 mg Oral QHS   sertraline  25 mg Oral QHS   Continuous Infusions:  cefTRIAXone (ROCEPHIN)  IV 2 g (08/25/22 0843)   PRN Meds:.acetaminophen **OR** acetaminophen, morphine injection **OR** HYDROcodone-acetaminophen, ondansetron **OR** ondansetron (ZOFRAN) IV  Antimicrobials: Anti-infectives (From admission, onward)    Start     Dose/Rate Route Frequency Ordered Stop   08/25/22 1000  metroNIDAZOLE (FLAGYL) tablet 500 mg        500 mg Oral Every 12 hours 08/25/22 0810     08/25/22 1000  cefTRIAXone (ROCEPHIN) 2 g in sodium chloride 0.9 % 100 mL IVPB        2 g 200 mL/hr over 30 Minutes Intravenous Every 24 hours 08/25/22 0822     08/25/22 0815  cefTRIAXone (ROCEPHIN) 1 g in sodium chloride 0.9 % 100 mL IVPB  Status:  Discontinued        1 g 200 mL/hr over 30 Minutes Intravenous Every 24 hours 08/25/22 0810 08/25/22 0821   08/25/22 0315  ciprofloxacin (CIPRO) IVPB 400 mg        400 mg 200 mL/hr over 60 Minutes Intravenous  Once 08/25/22 0303 08/25/22 0415   08/25/22 0315  metroNIDAZOLE (FLAGYL) IVPB 500 mg        500 mg 100 mL/hr over 60 Minutes Intravenous  Once 08/25/22 0303 08/25/22 0405        I have personally reviewed the following labs and images: CBC: Recent Labs  Lab 08/24/22 2325  WBC 13.6*  NEUTROABS 10.7*  HGB 10.8*  HCT 32.9*  MCV 102.2*  PLT 359   BMP &GFR Recent Labs  Lab 08/24/22 2325  NA 133*  K 3.5  CL 98  CO2 26  GLUCOSE 172*  BUN 19  CREATININE 0.85  CALCIUM 9.4   CrCl cannot be calculated (Unknown ideal weight.). Liver & Pancreas: Recent Labs  Lab  08/24/22 2325  AST 21  ALT 14  ALKPHOS 80  BILITOT 0.8  PROT 6.5  ALBUMIN 3.4*   Recent Labs  Lab 08/24/22 2325  LIPASE 27   No results for input(s): "AMMONIA" in the last 168 hours. Diabetic: No results for input(s): "HGBA1C" in the last 72 hours. No results for input(s): "GLUCAP" in the last 168 hours. Cardiac Enzymes: No results for input(s): "CKTOTAL", "CKMB", "CKMBINDEX", "TROPONINI" in the last 168 hours. No results for input(s): "PROBNP" in the last 8760 hours. Coagulation Profile: No results for input(s): "INR", "PROTIME" in the last 168 hours. Thyroid Function Tests: No results for input(s): "TSH", "T4TOTAL", "FREET4", "T3FREE", "THYROIDAB" in the last  72 hours. Lipid Profile: No results for input(s): "CHOL", "HDL", "LDLCALC", "TRIG", "CHOLHDL", "LDLDIRECT" in the last 72 hours. Anemia Panel: No results for input(s): "VITAMINB12", "FOLATE", "FERRITIN", "TIBC", "IRON", "RETICCTPCT" in the last 72 hours. Urine analysis:    Component Value Date/Time   COLORURINE YELLOW 08/24/2022 2325   APPEARANCEUR CLEAR 08/24/2022 2325   LABSPEC 1.012 08/24/2022 2325   PHURINE 7.0 08/24/2022 2325   GLUCOSEU NEGATIVE 08/24/2022 2325   HGBUR NEGATIVE 08/24/2022 2325   BILIRUBINUR NEGATIVE 08/24/2022 2325   KETONESUR NEGATIVE 08/24/2022 2325   PROTEINUR NEGATIVE 08/24/2022 2325   NITRITE NEGATIVE 08/24/2022 2325   LEUKOCYTESUR NEGATIVE 08/24/2022 2325   Sepsis Labs: Invalid input(s): "PROCALCITONIN", "LACTICIDVEN"  Microbiology: No results found for this or any previous visit (from the past 240 hour(s)).  Radiology Studies: CT Abdomen Pelvis W Contrast  Result Date: 08/25/2022 CLINICAL DATA:  Abdominal pain for 1 month EXAM: CT ABDOMEN AND PELVIS WITH CONTRAST TECHNIQUE: Multidetector CT imaging of the abdomen and pelvis was performed using the standard protocol following bolus administration of intravenous contrast. RADIATION DOSE REDUCTION: This exam was performed according  to the departmental dose-optimization program which includes automated exposure control, adjustment of the mA and/or kV according to patient size and/or use of iterative reconstruction technique. CONTRAST:  65m OMNIPAQUE IOHEXOL 300 MG/ML  SOLN COMPARISON:  CT abdomen and pelvis 07/11/2022 FINDINGS: Lower chest: Cardiomegaly. Marked basilar atelectasis/scarring. No acute abnormality. Hepatobiliary: No suspicious focal hepatic lesion. No evidence of cholecystitis. No gallstones. No biliary dilation. Pancreas: Unremarkable. Spleen: Unremarkable. Adrenals/Urinary Tract: Adrenal glands are unremarkable. Cortical renal scarring in both kidneys. Multiple small hypoattenuating lesions statistically likely to be cysts. No follow-up is required. No urinary calculi. Unchanged mild right hydroureter. Distended bladder. Stomach/Bowel: Wall thickening and adjacent stranding about the sigmoid colon and rectum where there are numerous diverticula. Small amount of free fluid in the pelvis. No free intraperitoneal air. Normal caliber large and small bowel. Wall thickening of the stomach may be due to decompression though gastritis is not excluded. Vascular/Lymphatic: Aortic atherosclerosis. No enlarged abdominal or pelvic lymph nodes. Reproductive: Uterus and bilateral adnexa are unremarkable. Other: No abdominal wall hernia or abnormality. Musculoskeletal: Scoliosis. Degenerative changes in the hips and spine. No acute osseous abnormality. IMPRESSION: Acute sigmoid diverticulitis. No drainable fluid collection or free intraperitoneal air. Proctitis may be reactive, secondary to diverticulitis. Aortic Atherosclerosis (ICD10-I70.0). Electronically Signed   By: TPlacido SouM.D.   On: 08/25/2022 02:36   DG Abd Acute W/Chest  Result Date: 08/24/2022 CLINICAL DATA:  Abdominal pain EXAM: DG ABDOMEN ACUTE WITH 1 VIEW CHEST COMPARISON:  CT and chest x-ray 07/11/2022. FINDINGS: Heart is borderline in size. Aortic atherosclerosis.  Lungs clear. No effusions. Severe thoracolumbar scoliosis and degenerative changes. Nonobstructive bowel gas pattern. No organomegaly, free air or suspicious calcification. IMPRESSION: No acute findings. Electronically Signed   By: KRolm BaptiseM.D.   On: 08/24/2022 23:59      Loistine Eberlin T. GUlmer If 7PM-7AM, please contact night-coverage [email or web address] 08/25/2022, 11:45 AM

## 2022-08-25 NOTE — ED Notes (Signed)
Pt refused zofran, reports "I don't need it if I'm not nauseated"

## 2022-08-25 NOTE — ED Notes (Signed)
Pt assisted to BR after CT scanner, was able to provide a urine sample, pt taken back to her room and placed back on monitoring device, husband at the bedside, coffee given to Mr. Revak. This RN observed even RR and unlabored, side rails up x2 for safety, pt expresses no needs or concerns at this time, call light within reach, no further concerns as of present. Will await results or urine and CT to determine POC from here.

## 2022-08-25 NOTE — ED Notes (Signed)
Pt's BP cuff adjusted on her arm per request for comfort, pt given two additional warm blanket and her husband a warm blanket. Advised waiting on CT at this time and to let staff know when she thinks she can give a uine sample. Pt voiced understanding.

## 2022-08-25 NOTE — ED Notes (Signed)
Pt to CT scanner at this time 

## 2022-08-25 NOTE — Assessment & Plan Note (Signed)
Continue Eliquis 2.5 mg bid. On pindolol at home.

## 2022-08-25 NOTE — Assessment & Plan Note (Signed)
Stable. Continue ASA 81 mg daily.

## 2022-08-25 NOTE — ED Notes (Signed)
Pt appears to be sleeping, observe even RR and unlabored, NAD noted, side rails up x2 for safety, plan of care ongoing, call light within reach, no further concerns as of present.   

## 2022-08-25 NOTE — ED Notes (Signed)
Pt called out to req nausea medication.

## 2022-08-26 ENCOUNTER — Ambulatory Visit: Payer: Medicare Other | Admitting: Cardiology

## 2022-08-26 DIAGNOSIS — R5381 Other malaise: Secondary | ICD-10-CM

## 2022-08-26 DIAGNOSIS — I2510 Atherosclerotic heart disease of native coronary artery without angina pectoris: Secondary | ICD-10-CM | POA: Diagnosis not present

## 2022-08-26 DIAGNOSIS — E871 Hypo-osmolality and hyponatremia: Secondary | ICD-10-CM

## 2022-08-26 DIAGNOSIS — I5032 Chronic diastolic (congestive) heart failure: Secondary | ICD-10-CM | POA: Diagnosis not present

## 2022-08-26 DIAGNOSIS — E118 Type 2 diabetes mellitus with unspecified complications: Secondary | ICD-10-CM | POA: Diagnosis not present

## 2022-08-26 DIAGNOSIS — K5792 Diverticulitis of intestine, part unspecified, without perforation or abscess without bleeding: Secondary | ICD-10-CM | POA: Diagnosis not present

## 2022-08-26 LAB — CBC WITH DIFFERENTIAL/PLATELET
Abs Immature Granulocytes: 0.08 10*3/uL — ABNORMAL HIGH (ref 0.00–0.07)
Basophils Absolute: 0 10*3/uL (ref 0.0–0.1)
Basophils Relative: 0 %
Eosinophils Absolute: 0.1 10*3/uL (ref 0.0–0.5)
Eosinophils Relative: 0 %
HCT: 30.9 % — ABNORMAL LOW (ref 36.0–46.0)
Hemoglobin: 10.1 g/dL — ABNORMAL LOW (ref 12.0–15.0)
Immature Granulocytes: 1 %
Lymphocytes Relative: 6 %
Lymphs Abs: 1.1 10*3/uL (ref 0.7–4.0)
MCH: 34 pg (ref 26.0–34.0)
MCHC: 32.7 g/dL (ref 30.0–36.0)
MCV: 104 fL — ABNORMAL HIGH (ref 80.0–100.0)
Monocytes Absolute: 1.8 10*3/uL — ABNORMAL HIGH (ref 0.1–1.0)
Monocytes Relative: 11 %
Neutro Abs: 13.7 10*3/uL — ABNORMAL HIGH (ref 1.7–7.7)
Neutrophils Relative %: 82 %
Platelets: 336 10*3/uL (ref 150–400)
RBC: 2.97 MIL/uL — ABNORMAL LOW (ref 3.87–5.11)
RDW: 13.2 % (ref 11.5–15.5)
WBC: 16.7 10*3/uL — ABNORMAL HIGH (ref 4.0–10.5)
nRBC: 0 % (ref 0.0–0.2)

## 2022-08-26 LAB — GLUCOSE, CAPILLARY
Glucose-Capillary: 149 mg/dL — ABNORMAL HIGH (ref 70–99)
Glucose-Capillary: 154 mg/dL — ABNORMAL HIGH (ref 70–99)
Glucose-Capillary: 140 mg/dL — ABNORMAL HIGH (ref 70–99)
Glucose-Capillary: 179 mg/dL — ABNORMAL HIGH (ref 70–99)

## 2022-08-26 LAB — COMPREHENSIVE METABOLIC PANEL
ALT: 12 U/L (ref 0–44)
AST: 19 U/L (ref 15–41)
Albumin: 2.8 g/dL — ABNORMAL LOW (ref 3.5–5.0)
Alkaline Phosphatase: 65 U/L (ref 38–126)
Anion gap: 8 (ref 5–15)
BUN: 14 mg/dL (ref 8–23)
CO2: 24 mmol/L (ref 22–32)
Calcium: 8.3 mg/dL — ABNORMAL LOW (ref 8.9–10.3)
Chloride: 98 mmol/L (ref 98–111)
Creatinine, Ser: 0.77 mg/dL (ref 0.44–1.00)
GFR, Estimated: >60 mL/min (ref >60)
Glucose, Bld: 138 mg/dL — ABNORMAL HIGH (ref 70–99)
Potassium: 3.5 mmol/L (ref 3.5–5.1)
Sodium: 130 mmol/L — ABNORMAL LOW (ref 135–145)
Total Bilirubin: 0.6 mg/dL (ref 0.3–1.2)
Total Protein: 5.8 g/dL — ABNORMAL LOW (ref 6.5–8.1)

## 2022-08-26 NOTE — Progress Notes (Signed)
PROGRESS NOTE  Heather Olsen RZN:[id number] DOB: [birth year]   PCP: Deland Pretty, MD  Patient is from: Home.  DOA: 08/24/2022 LOS: 1  Chief complaints Chief Complaint  Patient presents with   Abdominal Pain     Brief Narrative / Interim history: 88 year old F with PMH of CKD-3A, DM-2, CAD, paroxysmal A-fib on Eliquis, HLD and remote history of diverticulitis presenting with nausea and LLQ pain, and found to have acute sigmoid diverticulitis without complication.  She was started on IV ceftriaxone and Flagyl.   Remains on IV ceftriaxone and IV Flagyl.  Subjective: Seen and examined earlier this morning.  No major events overnight or this morning.  Continues to endorse significant abdominal pain.  Hip pain 07/10 earlier this morning but improved to 5/10 after pain medication.  She had some mucoid stool earlier this morning.  Some nausea but no emesis.  Objective: Vitals:   08/26/22 0436 08/26/22 0911 08/26/22 1314 08/26/22 1417  BP: (!) 142/71 (!) 113/57  125/74  Pulse: 83 75  64  Resp: 14   16  Temp: 99.6 F (37.6 C)   98.6 F (37 C)  TempSrc: Oral   Oral  SpO2: 93%   98%  Weight:   50.3 kg   Height:   '5\' 4"'$  (1.626 m)     Examination:  GENERAL: No apparent distress.  Nontoxic. HEENT: MMM.  Vision and hearing grossly intact.  NECK: Supple.  No apparent JVD.  RESP:  No IWOB.  Fair aeration bilaterally. CVS:  RRR. Heart sounds normal.  ABD/GI/GU: BS+. Abd soft.  Mild tenderness in left abdomen.  No rebound or guarding. MSK/EXT:  Moves extremities. No apparent deformity.  Trace BLE edema. SKIN: no apparent skin lesion or wound NEURO: Awake and alert. Oriented appropriately.  No apparent focal neuro deficit. PSYCH: Calm. Normal affect.   Procedures:  None  Microbiology summarized: None  Assessment and plan: Principal Problem:   Acute diverticulitis Active Problems:   HLD (hyperlipidemia)   Atherosclerotic heart disease of native coronary artery without angina  pectoris   PAF (paroxysmal atrial fibrillation) (HCC)   Type 2 diabetes mellitus with unspecified complications ([place])   Long term (current) use of anticoagulants   Raynaud's disease   Tricuspid insufficiency   Restless legs syndrome   Chronic diastolic CHF (congestive heart failure) ([place])   Unintentional weight loss  Acute sigmoid diverticulitis: Remote history of diverticulitis.  No perforation or abscess on CT. still with significant pain and nausea but no emesis.  Leukocytosis slightly worse. -Continue IV Rocephin and Flagyl -Continue IV fluid and antiemetics -Continue full liquid diet   Paroxysmal A-fib: Rate controlled. Long term (current) use of anticoagulants -Continue pindolol 2.5 mg twice daily -Continue Eliquis 2.5 mg twice daily  Chronic diastolic CHF: TTE on 01/26/3142 with LVEF of 60 to 65%, G2 DD and moderate TVR.  No cardiopulmonary symptoms but trace edema in BLE.  Seems to be on p.o. Lasix 20 mg daily at home -Continue holding diuretics. -Monitor intake and output  Uncontrolled NIDDM-2 with HLD and hyperglycemia: A1c 7.1%.  Seems to be on low-dose metformin at home. Recent Labs  Lab 08/25/22 1204 08/25/22 1713 08/25/22 2032 08/26/22 0725 08/26/22 1125  GLUCAP 153* 158* 228* 149* 154*  -Continue SSI and CBG monitoring  History of CAD: No cardiopulmonary symptoms. -Continue aspirin and statin  Mood disorder: Stable -Continue home medications  Chronic constipation: -Hold home Linzess for now -Start MiraLAX once she started taking p.o.  Hyponatremia: Hypervolemic? -Continue trending.  CKD  ruled out.  Generalized weakness/physical deconditioning -PT/OT-recommended home health.  Unintentional weight loss: Attributes to poor p.o. intake.  Seems like she has history of Raynaud's disease.  Followed by digestive health at [facility] for EGD at some point.  Weight trend as below. Body mass index is 19.05 kg/m. Wt Readings from Last 10 Encounters:   08/26/22 50.3 kg  08/09/22 50.1 kg  07/11/22 51.7 kg  03/22/22 51.9 kg  02/25/22 51.4 kg  02/04/22 51.7 kg  01/26/22 52.8 kg  09/28/21 52.2 kg  04/13/21 54 kg  01/01/21 52.3 kg  -We will consult dietitian once she start eating -Outpatient follow-up with GI           DVT prophylaxis:  apixaban (ELIQUIS) tablet 2.5 mg Start: 08/25/22 1000 SCDs Start: 08/25/22 0810 apixaban (ELIQUIS) tablet 2.5 mg  Code Status: Full code Family Communication: None at bedside Level of care: Med-Surg Status is: Inpatient The patient will remain inpatient because: Acute diverticulitis requiring IV antibiotics in the setting of ongoing nausea and significant abdominal pain   Final disposition: Home with home health once medically stable Consultants:  None  Sch Meds:  Scheduled Meds:  apixaban  2.5 mg Oral BID   aspirin EC  81 mg Oral Daily   famotidine  40 mg Oral QHS   insulin aspart  0-5 Units Subcutaneous QHS   insulin aspart  0-9 Units Subcutaneous TID WC   metroNIDAZOLE  500 mg Oral Q12H   mirabegron ER  50 mg Oral Daily   pantoprazole  40 mg Oral BID   pindolol  2.5 mg Oral BID   rOPINIRole  0.5 mg Oral QHS   rosuvastatin  10 mg Oral QHS   sertraline  25 mg Oral QHS   Continuous Infusions:  cefTRIAXone (ROCEPHIN)  IV 2 g (08/26/22 0916)   PRN Meds:.acetaminophen **OR** acetaminophen, morphine injection **OR** HYDROcodone-acetaminophen, ondansetron **OR** ondansetron (ZOFRAN) IV  Antimicrobials: Anti-infectives (From admission, onward)    Start     Dose/Rate Route Frequency Ordered Stop   08/25/22 1000  metroNIDAZOLE (FLAGYL) tablet 500 mg        500 mg Oral Every 12 hours 08/25/22 0810     08/25/22 1000  cefTRIAXone (ROCEPHIN) 2 g in sodium chloride 0.9 % 100 mL IVPB        2 g 200 mL/hr over 30 Minutes Intravenous Every 24 hours 08/25/22 0822     08/25/22 0815  cefTRIAXone (ROCEPHIN) 1 g in sodium chloride 0.9 % 100 mL IVPB  Status:  Discontinued        1 g 200 mL/hr  over 30 Minutes Intravenous Every 24 hours 08/25/22 0810 08/25/22 0821   08/25/22 0315  ciprofloxacin (CIPRO) IVPB 400 mg        400 mg 200 mL/hr over 60 Minutes Intravenous  Once 08/25/22 0303 08/25/22 0415   08/25/22 0315  metroNIDAZOLE (FLAGYL) IVPB 500 mg        500 mg 100 mL/hr over 60 Minutes Intravenous  Once 08/25/22 0303 08/25/22 0405        I have personally reviewed the following labs and images: CBC: Recent Labs  Lab 08/24/22 2325 08/26/22 0634  WBC 13.6* 16.7*  NEUTROABS 10.7* 13.7*  HGB 10.8* 10.1*  HCT 32.9* 30.9*  MCV 102.2* 104.0*  PLT 359 336   BMP &GFR Recent Labs  Lab 08/24/22 2325 08/26/22 0634  NA 133* 130*  K 3.5 3.5  CL 98 98  CO2 26 24  GLUCOSE 172* 138*  BUN 19 14  CREATININE 0.85 0.77  CALCIUM 9.4 8.3*   Estimated Creatinine Clearance: 37.9 mL/min (by C-G formula based on SCr of 0.77 mg/dL). Liver & Pancreas: Recent Labs  Lab 08/24/22 2325 08/26/22 0634  AST 21 19  ALT 14 12  ALKPHOS 80 65  BILITOT 0.8 0.6  PROT 6.5 5.8*  ALBUMIN 3.4* 2.8*   Recent Labs  Lab 08/24/22 2325  LIPASE 27   No results for input(s): "AMMONIA" in the last 168 hours. Diabetic: Recent Labs    08/25/22 0810  HGBA1C 7.1*   Recent Labs  Lab 08/25/22 1204 08/25/22 1713 08/25/22 2032 08/26/22 0725 08/26/22 1125  GLUCAP 153* 158* 228* 149* 154*   Cardiac Enzymes: No results for input(s): "CKTOTAL", "CKMB", "CKMBINDEX", "TROPONINI" in the last 168 hours. No results for input(s): "PROBNP" in the last 8760 hours. Coagulation Profile: No results for input(s): "INR", "PROTIME" in the last 168 hours. Thyroid Function Tests: No results for input(s): "TSH", "T4TOTAL", "FREET4", "T3FREE", "THYROIDAB" in the last 72 hours. Lipid Profile: No results for input(s): "CHOL", "HDL", "LDLCALC", "TRIG", "CHOLHDL", "LDLDIRECT" in the last 72 hours. Anemia Panel: No results for input(s): "VITAMINB12", "FOLATE", "FERRITIN", "TIBC", "IRON", "RETICCTPCT" in the  last 72 hours. Urine analysis:    Component Value Date/Time   COLORURINE YELLOW 08/24/2022 2325   APPEARANCEUR CLEAR 08/24/2022 2325   LABSPEC 1.012 08/24/2022 2325   PHURINE 7.0 08/24/2022 2325   GLUCOSEU NEGATIVE 08/24/2022 2325   HGBUR NEGATIVE 08/24/2022 2325   BILIRUBINUR NEGATIVE 08/24/2022 2325   KETONESUR NEGATIVE 08/24/2022 2325   PROTEINUR NEGATIVE 08/24/2022 2325   NITRITE NEGATIVE 08/24/2022 2325   LEUKOCYTESUR NEGATIVE 08/24/2022 2325   Sepsis Labs: Invalid input(s): "PROCALCITONIN", "LACTICIDVEN"  Microbiology: No results found for this or any previous visit (from the past 240 hour(s)).  Radiology Studies: No results found.    Heather Olsen T. [place]on  If 7PM-7AM, please contact night-coverage [email or web address] 08/26/2022, 3:59 PM

## 2022-08-26 NOTE — TOC Initial Note (Signed)
Transition of Care [facility]) - Initial/Assessment Note    Patient Details  Name: Heather Olsen MRN: [id number] Date of Birth: 09-25-1931  Transition of Care [facility]) CM/SW Contact:    Leeroy Cha, RN Phone Number: 08/26/2022, 7:41 AM  Clinical Narrative:                  Transition of Care [facility]) Screening Note   Patient Details  Name: Heather Olsen Date of Birth: 05/08/1931   Transition of Care [name]) CM/SW Contact:    Leeroy Cha, RN Phone Number: 08/26/2022, 7:41 AM    Transition of Care [facility]) has reviewed patient and no TOC needs have been identified at this time. We will continue to monitor patient advancement through interdisciplinary progression rounds. If new patient transition needs arise, please place a TOC consult.    Expected Discharge Plan: Home/Self Care Barriers to Discharge: Continued Medical Work up   Patient Goals and CMS Choice Patient states their goals for this hospitalization and ongoing recovery are:: to go home CMS Medicare.gov Compare Post Acute Care list provided to:: Patient Choice offered to / list presented to : Patient  Expected Discharge Plan and Services Expected Discharge Plan: Home/Self Care   Discharge Planning Services: CM Consult   Living arrangements for the past 2 months: Apartment                                      Prior Living Arrangements/Services Living arrangements for the past 2 months: Apartment Lives with:: Self Patient language and need for interpreter reviewed:: Yes Do you feel safe going back to the place where you live?: Yes            Criminal Activity/Legal Involvement Pertinent to Current Situation/Hospitalization: No - Comment as needed  Activities of Daily Living Home Assistive Devices/Equipment: Eyeglasses, Hearing aid, Cane (specify quad or straight) (bilateral hearing aides) ADL Screening (condition at time of admission) Patient's cognitive ability adequate to safely complete  daily activities?: Yes Is the patient deaf or have difficulty hearing?: Yes (wears 2 hearing aides) Does the patient have difficulty seeing, even when wearing glasses/contacts?: No Does the patient have difficulty concentrating, remembering, or making decisions?: No Patient able to express need for assistance with ADLs?: Yes Does the patient have difficulty dressing or bathing?: No Independently performs ADLs?: Yes (appropriate for developmental age) Does the patient have difficulty walking or climbing stairs?: Yes Weakness of Legs: Both Weakness of Arms/Hands: None  Permission Sought/Granted                  Emotional Assessment Appearance:: Appears stated age Attitude/Demeanor/Rapport: Engaged Affect (typically observed): Calm Orientation: : Oriented to Self, Oriented to Place, Oriented to  Time, Oriented to Situation Alcohol / Substance Use: Alcohol Use (occasional use) Psych Involvement: No (comment)  Admission diagnosis:  Hyponatremia [E87.1] Sigmoid diverticulitis [K57.32] Macrocytic anemia [D53.9] Elevated random blood glucose level [R73.09] Acute diverticulitis [K57.92] Patient Active Problem List   Diagnosis Date Noted   Acute diverticulitis 08/25/2022   Chronic diastolic CHF (congestive heart failure) ([place]) 08/25/2022   Unintentional weight loss 08/25/2022   Metatarsalgia, left foot 02/25/2022   Anxiety 01/25/2022   Calcium pyrophosphate arthropathy of multiple sites 01/25/2022   Chronic insomnia 01/25/2022   Easy bruising 01/25/2022   Encounter for general adult medical examination without abnormal findings 01/25/2022   Gastroesophageal reflux disease 01/25/2022   Heart failure ([place]) 01/25/2022  History of depression 01/25/2022   History of diverticulitis 01/25/2022   Hyperglycemia due to type 2 diabetes mellitus ([place]) 01/25/2022   Irritable bowel syndrome with diarrhea 01/25/2022   Left upper quadrant pain 01/25/2022   Long term (current) use of  anticoagulants 01/25/2022   Long term (current) use of aspirin 01/25/2022   Memory loss 01/25/2022   Constipation 01/25/2022   Nausea 01/25/2022   Night sweats 01/25/2022   Nocturia 01/25/2022   Other idiopathic scoliosis, thoracolumbar region 01/25/2022   Other specified postprocedural states 01/25/2022   Pain in thoracic spine 01/25/2022   Presence of coronary angioplasty implant and graft 01/25/2022   Primary thrombophilia ([place]) 01/25/2022   Pseudogout 01/25/2022   Pure hypercholesterolemia 01/25/2022   Raynaud's phenomenon 01/25/2022   Recurrent major depression in remission ([place]) 01/25/2022   Restless legs syndrome 01/25/2022   Senile hyperkeratosis 01/25/2022   Trochanteric bursitis of left hip 01/25/2022   Dermatochalasia 09/28/2021   Type 2 diabetes mellitus with unspecified complications ([place]) 96/02/5408   Pulmonary HTN ([place]) 04/11/2021   PAF (paroxysmal atrial fibrillation) ([place]) 05/31/2020   Educated about COVID-19 virus infection 02/13/2020   Dyspnea 12/29/2019   Degeneration of lumbar intervertebral disc 06/25/2019   Degenerative spondylolisthesis 06/25/2019   Osteopenia 06/25/2019   Atherosclerotic heart disease of native coronary artery without angina pectoris 03/01/2019   Leg swelling 03/01/2019   Dyslipidemia 03/01/2019   Bilateral carpal tunnel syndrome 03/27/2017   Urgency of micturition 10/11/2016   Hyperglycemia 09/14/2016   Tricuspid insufficiency 06/27/2016   Arteriosclerosis of coronary artery 04/12/2016   HLD (hyperlipidemia) 04/12/2016   Major depression 04/12/2016   Idiopathic scoliosis 04/12/2016   Spinal stenosis of lumbar region 04/12/2016   Aortic stenosis 04/12/2016   Mitral regurgitation 04/12/2016   Pernicious anemia 04/12/2016   Hearing loss 04/12/2016   Glaucoma 04/12/2016   Palpitations 04/12/2016   Hiatal hernia 04/12/2016   Back pain 04/12/2016   Osteoporosis, senile 04/12/2016   Anemia due to vitamin B12 deficiency 04/12/2016    Raynaud's disease 04/12/2016   Balance disorder 04/12/2016   Beat, premature ventricular 01/08/2016   APC (atrial premature contractions) 06/16/2014   Fatigue 06/17/2013   Essential hypertension 06/17/2013   PCP:  Deland Pretty, MD Pharmacy:   [facility], [place] [address] Phone: [phone] Fax: [phone]     Social Determinants of Health (SDOH) Interventions    Readmission Risk Interventions   No data to display

## 2022-08-26 NOTE — Evaluation (Signed)
Physical Therapy Evaluation Patient Details Name: Heather Olsen MRN: [id number] DOB: February 20, 1931 Today's Date: 08/26/2022  History of Present Illness  Pt is an 88 year old female admitted to the hospital with abdominal pain and nausea. She was found to have acute diverticulitis. PMH: CKD 3, CAD, HLD, a fib, scoliosis, OP, spinal stenosis  Clinical Impression  Pt admitted with above diagnosis.  Pt currently with functional limitations due to the deficits listed below (see PT Problem List). Pt will benefit from skilled PT to increase their independence and safety with mobility to allow discharge to the venue listed below.  Pt assisted with ambulating in hallway and appears close to her mobility baseline.  Pt holds onto her husband or uses cane to go to dining hall.  Typically furniture walks around her apt.  Pt does have RW and rollator however has never used them.  Recommend f/u HHPT upon d/c, and pt encouraged to use RW or rollator for bil UE support with ambulating upon d/c.        Recommendations for follow up therapy are one component of a multi-disciplinary discharge planning process, led by the attending physician.  Recommendations may be updated based on patient status, additional functional criteria and insurance authorization.  Follow Up Recommendations Home health PT      Assistance Recommended at Discharge PRN  Patient can return home with the following  Help with stairs or ramp for entrance;Assistance with cooking/housework    Equipment Recommendations None recommended by PT  Recommendations for Other Services       Functional Status Assessment Patient has had a recent decline in their functional status and demonstrates the ability to make significant improvements in function in a reasonable and predictable amount of time.     Precautions / Restrictions Precautions Precautions: Fall Restrictions Weight Bearing Restrictions: No      Mobility  Bed Mobility                General bed mobility comments: sitting EOB on arrival    Transfers Overall transfer level: Needs assistance Equipment used: None Transfers: Sit to/from Stand Sit to Stand: Supervision                Ambulation/Gait Ambulation/Gait assistance: Min guard Gait Distance (Feet): 240 Feet Assistive device: 1 person hand held assist Gait Pattern/deviations: Decreased stride length, Step-through pattern, Narrow base of support       General Gait Details: pt requiring UE support so provided HHA, pt also occasionally grazing hand rail, mildly unsteady, encouraged pt to use her RW or rollator upon d/c for a little more support  Stairs            Wheelchair Mobility    Modified Rankin (Stroke Patients Only)       Balance Overall balance assessment: Needs assistance         Standing balance support: No upper extremity supported Standing balance-Leahy Scale: Fair                               Pertinent Vitals/Pain Pain Assessment Pain Assessment: No/denies pain    Home Living Family/patient expects to be discharged to:: Private residence Living Arrangements: Spouse/significant other   Type of Home: Independent living facility Home Access: Level entry         Home Equipment: Shower seat - built in;Rollator (4 wheels);Cane - single Barista (2 wheels)      Prior Function  Mobility Comments: She did not use an assistive device for ambulation inside, however used a cane when ambulating to & from the dining hall and when out in the community. ADLs Comments: She was independent with ADLs, performing light meal prep, and driving.     Hand Dominance   Dominant Hand: Right    Extremity/Trunk Assessment   Upper Extremity Assessment Upper Extremity Assessment: Overall WFL for tasks assessed    Lower Extremity Assessment Lower Extremity Assessment: Overall WFL for tasks assessed       Communication    Communication: No difficulties  Cognition Arousal/Alertness: Awake/alert Behavior During Therapy: WFL for tasks assessed/performed Overall Cognitive Status: Within Functional Limits for tasks assessed                                          General Comments      Exercises     Assessment/Plan    PT Assessment Patient needs continued PT services  PT Problem List Decreased balance;Decreased mobility;Decreased knowledge of use of DME       PT Treatment Interventions Gait training;Functional mobility training;Therapeutic activities;Patient/family education;Balance training;Therapeutic exercise;DME instruction    PT Goals (Current goals can be found in the Care Plan section)  Acute Rehab PT Goals PT Goal Formulation: With patient Time For Goal Achievement: 09/09/22 Potential to Achieve Goals: Good    Frequency Min 3X/week     Co-evaluation               AM-PAC PT "6 Clicks" Mobility  Outcome Measure Help needed turning from your back to your side while in a flat bed without using bedrails?: None Help needed moving from lying on your back to sitting on the side of a flat bed without using bedrails?: None Help needed moving to and from a bed to a chair (including a wheelchair)?: None Help needed standing up from a chair using your arms (e.g., wheelchair or bedside chair)?: A Little Help needed to walk in hospital room?: A Little Help needed climbing 3-5 steps with a railing? : A Little 6 Click Score: 21    End of Session Equipment Utilized During Treatment: Gait belt Activity Tolerance: Patient tolerated treatment well Patient left: in chair;with call bell/phone within reach;with family/visitor present Nurse Communication: Mobility status PT Visit Diagnosis: Difficulty in walking, not elsewhere classified (R26.2)    Time: 8264-1583 PT Time Calculation (min) (ACUTE ONLY): 15 min   Charges:   PT Evaluation $PT Eval Low Complexity: 1 Low         Kati PT, DPT Physical Therapist Acute Rehabilitation Services Preferred contact method: Secure Chat Weekend Pager Only: [phone] Office: [phone]   Myrtis Hopping [place] 08/26/2022, 3:15 PM

## 2022-08-26 NOTE — Evaluation (Signed)
Occupational Therapy Evaluation Patient Details Name: Heather Olsen MRN: [id number] DOB: 09-Mar-1931 Today's Date: 08/26/2022   History of Present Illness Heather Olsen was admitted to the hospital with abdominal pain and nausea. She was would to have acute diverticulitis. PMH: CKD 3, CAD, HLD, a fib, scoliosis, OP, spinal stenosis   Clinical Impression   Pt appears to be near her baseline level of functioning for basic self-care management tasks, including toileting, grooming, and dressing. All needed OT education and/or intervention was provided during her hospital stay & she does not require further OT services. As such, OT will sign off and recommend the pt return home at discharge.      Recommendations for follow up therapy are one component of a multi-disciplinary discharge planning process, led by the attending physician.  Recommendations may be updated based on patient status, additional functional criteria and insurance authorization.   Follow Up Recommendations  No OT follow up    Assistance Recommended at Discharge PRN  Patient can return home with the following A little help with bathing/dressing/bathroom;Assistance with cooking/housework;Assist for transportation    Functional Status Assessment  Patient has had a recent decline in their functional status and demonstrates the ability to make significant improvements in function in a reasonable and predictable amount of time.  Equipment Recommendations  None recommended by OT       Precautions / Restrictions Precautions Precautions: Fall Restrictions Weight Bearing Restrictions: No      Mobility     Transfers Overall transfer level: Needs assistance   Transfers: Sit to/from Stand Sit to Stand: Supervision            ADL either performed or assessed with clinical judgement   ADL   Eating/Feeding: Independent   Grooming: Supervision/safety;Wash/dry hands Grooming Details (indicate cue type and reason): she  performed in standing at sink level         Upper Body Dressing : Set up Upper Body Dressing Details (indicate cue type and reason): simulated in sitting Lower Body Dressing: Supervision/safety Lower Body Dressing Details (indicate cue type and reason): She performed sock management seated in the bedside chair. Toilet Transfer: Supervision/safety;Ambulation   Toileting- Clothing Manipulation and Hygiene: Supervision/safety Toileting - Clothing Manipulation Details (indicate cue type and reason): Performed at bathroom level             Vision Baseline Vision/History: 1 Wears glasses Patient Visual Report: No change from baseline Additional Comments: she correctly read the time depicted on the wall clock            Pertinent Vitals/Pain Pain Assessment Pain Assessment: 0-10 Pain Score: 4  Pain Location: abdomen Pain Intervention(s):  (Patient reported receiving pain medication.)     Hand Dominance Right   Extremity/Trunk Assessment Upper Extremity Assessment Upper Extremity Assessment: Overall WFL for tasks assessed   Lower Extremity Assessment Lower Extremity Assessment: Overall WFL for tasks assessed       Communication Communication Communication: No difficulties   Cognition Arousal/Alertness: Awake/alert Behavior During Therapy: WFL for tasks assessed/performed Overall Cognitive Status: Within Functional Limits for tasks assessed                        Home Living Family/patient expects to be discharged to:: Private residence Living Arrangements: Spouse/significant other   Type of Home: Independent living facility Home Access: Level entry           Bathroom Shower/Tub: Walk-in shower         Home Equipment:  Shower seat - built in;Rollator (4 wheels);Cane - single point          Prior Functioning/Environment               Mobility Comments: She did not use an assistive device for ambulation inside, however used a cane when  ambulating to & from the dining hall and when out in the community. ADLs Comments: She was independent with ADLs, performing light meal prep, and driving.        OT Problem List: Pain       AM-PAC OT "6 Clicks" Daily Activity     Outcome Measure Help from another person eating meals?: None Help from another person taking care of personal grooming?: None Help from another person toileting, which includes using toliet, bedpan, or urinal?: None Help from another person bathing (including washing, rinsing, drying)?: A Little Help from another person to put on and taking off regular upper body clothing?: None Help from another person to put on and taking off regular lower body clothing?: None 6 Click Score: 23   End of Session Nurse Communication:  (Nurse cleared the pt for participation in the session)  Activity Tolerance: Patient tolerated treatment well Patient left: in chair;with call bell/phone within reach;with family/visitor present  OT Visit Diagnosis: Pain                Time: 7893-8101 OT Time Calculation (min): 20 min Charges:  OT General Charges $OT Visit: 1 Visit OT Evaluation $OT Eval Low Complexity: 1 Low   Tujuana Kilmartin L Tria Noguera, OTR/L 08/26/2022, 1:39 PM

## 2022-08-27 DIAGNOSIS — E876 Hypokalemia: Secondary | ICD-10-CM

## 2022-08-27 DIAGNOSIS — E118 Type 2 diabetes mellitus with unspecified complications: Secondary | ICD-10-CM | POA: Diagnosis not present

## 2022-08-27 DIAGNOSIS — K5792 Diverticulitis of intestine, part unspecified, without perforation or abscess without bleeding: Secondary | ICD-10-CM | POA: Diagnosis not present

## 2022-08-27 DIAGNOSIS — I2510 Atherosclerotic heart disease of native coronary artery without angina pectoris: Secondary | ICD-10-CM | POA: Diagnosis not present

## 2022-08-27 LAB — RENAL FUNCTION PANEL
Albumin: 2.9 g/dL — ABNORMAL LOW (ref 3.5–5.0)
Anion gap: 10 (ref 5–15)
BUN: 14 mg/dL (ref 8–23)
CO2: 23 mmol/L (ref 22–32)
Calcium: 8.4 mg/dL — ABNORMAL LOW (ref 8.9–10.3)
Chloride: 100 mmol/L (ref 98–111)
Creatinine, Ser: 0.8 mg/dL (ref 0.44–1.00)
GFR, Estimated: >60 mL/min (ref >60)
Glucose, Bld: 117 mg/dL — ABNORMAL HIGH (ref 70–99)
Phosphorus: 2.6 mg/dL (ref 2.5–4.6)
Potassium: 3.1 mmol/L — ABNORMAL LOW (ref 3.5–5.1)
Sodium: 133 mmol/L — ABNORMAL LOW (ref 135–145)

## 2022-08-27 LAB — GLUCOSE, CAPILLARY
Glucose-Capillary: 115 mg/dL — ABNORMAL HIGH (ref 70–99)
Glucose-Capillary: 173 mg/dL — ABNORMAL HIGH (ref 70–99)
Glucose-Capillary: 239 mg/dL — ABNORMAL HIGH (ref 70–99)
Glucose-Capillary: 205 mg/dL — ABNORMAL HIGH (ref 70–99)

## 2022-08-27 LAB — CBC
HCT: 30.1 % — ABNORMAL LOW (ref 36.0–46.0)
Hemoglobin: 9.6 g/dL — ABNORMAL LOW (ref 12.0–15.0)
MCH: 33.3 pg (ref 26.0–34.0)
MCHC: 31.9 g/dL (ref 30.0–36.0)
MCV: 104.5 fL — ABNORMAL HIGH (ref 80.0–100.0)
Platelets: 350 10*3/uL (ref 150–400)
RBC: 2.88 MIL/uL — ABNORMAL LOW (ref 3.87–5.11)
RDW: 13.2 % (ref 11.5–15.5)
WBC: 10.8 10*3/uL — ABNORMAL HIGH (ref 4.0–10.5)
nRBC: 0 % (ref 0.0–0.2)

## 2022-08-27 LAB — MAGNESIUM: Magnesium: 1.8 mg/dL (ref 1.7–2.4)

## 2022-08-27 MED ORDER — ENSURE ENLIVE PO LIQD
237.0000 mL | Freq: Two times a day (BID) | ORAL | Status: DC
  Administered 2022-08-27 – 2022-08-28 (×2): 237 mL via ORAL

## 2022-08-27 MED ORDER — SODIUM CHLORIDE 0.9 % IV SOLN
3.0000 g | Freq: Three times a day (TID) | INTRAVENOUS | Status: DC
  Filled 2022-08-27: qty 8

## 2022-08-27 MED ORDER — POTASSIUM CHLORIDE IN NACL 20-0.9 MEQ/L-% IV SOLN
INTRAVENOUS | Status: DC
  Filled 2022-08-27 (×2): qty 1000

## 2022-08-27 MED ORDER — POTASSIUM CHLORIDE CRYS ER 20 MEQ PO TBCR
40.0000 meq | EXTENDED_RELEASE_TABLET | ORAL | Status: AC
  Administered 2022-08-27 (×2): 40 meq via ORAL
  Filled 2022-08-27 (×2): qty 2

## 2022-08-27 MED ORDER — SODIUM CHLORIDE 0.9 % IV SOLN
3.0000 g | Freq: Four times a day (QID) | INTRAVENOUS | Status: DC
  Administered 2022-08-27 – 2022-08-28 (×5): 3 g via INTRAVENOUS
  Filled 2022-08-27 (×8): qty 8

## 2022-08-27 NOTE — Progress Notes (Signed)
PROGRESS NOTE  Heather Olsen NWG:[id number] DOB: July 06, 1931   PCP: Deland Pretty, MD  Patient is from: Home.  DOA: 08/24/2022 LOS: 2  Chief complaints Chief Complaint  Patient presents with   Abdominal Pain     Brief Narrative / Interim history: 88 year old F with PMH of CKD-3A, DM-2, CAD, paroxysmal A-fib on Eliquis, HLD and remote history of diverticulitis presenting with nausea and LLQ pain, and found to have acute sigmoid diverticulitis without complication.  She was started on IV ceftriaxone and Flagyl.   Abdominal pain improved.  Transition to IV Unasyn and advance to soft diet.  Likely discharge home on 10/1  Subjective: Seen and examined earlier this morning.  No major events overnight of this morning.  Abdominal pain better.  Still with some mucoid stool.  Leukocytosis improved.  Denies nausea or vomiting.  Tired of grits.  Likes to try soft diet.  Objective: Vitals:   08/26/22 1417 08/26/22 2201 08/27/22 0456 08/27/22 1333  BP: 125/74 131/77 136/75 109/62  Pulse: 64 72 74 69  Resp: '16 18 16 19  '$ Temp: 98.6 F (37 C) 98.6 F (37 C) 98.6 F (37 C) 98.1 F (36.7 C)  TempSrc: Oral Oral Oral Oral  SpO2: 98% 92% 94% 95%  Weight:      Height:        Examination:  GENERAL: No apparent distress.  Nontoxic. HEENT: MMM.  Vision and hearing grossly intact.  NECK: Supple.  No apparent JVD.  RESP:  No IWOB.  Fair aeration bilaterally. CVS:  RRR. Heart sounds normal.  ABD/GI/GU: BS+. Abd soft.  Some tenderness in left abdomen.  No rebound or guarding MSK/EXT:  Moves extremities. No apparent deformity.  Trace BLE edema. SKIN: no apparent skin lesion or wound NEURO: Awake and alert. Oriented appropriately.  No apparent focal neuro deficit. PSYCH: Calm. Normal affect.   Procedures:  None  Microbiology summarized: None  Assessment and plan: Principal Problem:   Acute diverticulitis Active Problems:   HLD (hyperlipidemia)   Atherosclerotic heart disease of native  coronary artery without angina pectoris   PAF (paroxysmal atrial fibrillation) (HCC)   Type 2 diabetes mellitus with unspecified complications ([place])   Long term (current) use of anticoagulants   Raynaud's disease   Tricuspid insufficiency   Restless legs syndrome   Chronic diastolic CHF (congestive heart failure) ([place])   Unintentional weight loss  Acute sigmoid diverticulitis/proctitis:  Remote history of diverticulitis.  No perforation or abscess on CT. pain and leukocytosis improved.  Tolerated full liquid diet but poor p.o. intake. -Change antibiotics to IV Unasyn -Advance to soft diet -IV fluid for hydration -Likely home in the next 24 to 48 hours on p.o. Augmentin   Paroxysmal A-fib: Rate controlled. Long term (current) use of anticoagulants -Continue pindolol 2.5 mg twice daily -Continue Eliquis 2.5 mg twice daily  Chronic diastolic CHF: TTE on 5/78/4696 with LVEF of 60 to 65%, G2 DD and moderate TVR.  No cardiopulmonary symptoms but trace edema in BLE.  Seems to be on p.o. Lasix 20 mg daily at home -Continue holding diuretics. -Gentle IV fluid -Monitor intake and output  Uncontrolled NIDDM-2 with HLD and hyperglycemia: A1c 7.1%.  On metformin at home. Recent Labs  Lab 08/26/22 1643 08/26/22 2205 08/27/22 0741 08/27/22 1135 08/27/22 1625  GLUCAP 140* 179* 115* 173* 239*  -Continue SSI and CBG monitoring  History of CAD: No cardiopulmonary symptoms. -Continue aspirin and statin  Mood disorder: Stable -Continue home medications  Chronic constipation: -Hold home Linzess for  now -Start MiraLAX once she started taking p.o.  Hyponatremia/hypokalemia: -PO  KCl 40x2. -IV fluid  CKD ruled out.  Generalized weakness/physical deconditioning -PT/OT-recommended home health.  Unintentional weight loss: Attributes to poor p.o. intake.  Seems like she has history of Raynaud's disease.  [facility] for EGD at some point.  Weight trend as  below. Body mass index is 19.05 kg/m. Wt Readings from Last 10 Encounters:  08/26/22 50.3 kg  08/09/22 50.1 kg  07/11/22 51.7 kg  03/22/22 51.9 kg  02/25/22 51.4 kg  02/04/22 51.7 kg  01/26/22 52.8 kg  09/28/21 52.2 kg  04/13/21 54 kg  01/01/21 52.3 kg  -We will consult dietitian once she start eating -Outpatient follow-up with GI           DVT prophylaxis:  apixaban (ELIQUIS) tablet 2.5 mg Start: 08/25/22 1000 SCDs Start: 08/25/22 0810 apixaban (ELIQUIS) tablet 2.5 mg  Code Status: Full code Family Communication: None at bedside Level of care: Med-Surg Status is: Inpatient The patient will remain inpatient because: Acute diverticulitis requiring IV antibiotics   Final disposition: Home with home health once medically stable Consultants:  None  Sch Meds:  Scheduled Meds:  apixaban  2.5 mg Oral BID   aspirin EC  81 mg Oral Daily   famotidine  40 mg Oral QHS   feeding supplement  237 mL Oral BID BM   insulin aspart  0-5 Units Subcutaneous QHS   insulin aspart  0-9 Units Subcutaneous TID WC   mirabegron ER  50 mg Oral Daily   pantoprazole  40 mg Oral BID   pindolol  2.5 mg Oral BID   rOPINIRole  0.5 mg Oral QHS   rosuvastatin  10 mg Oral QHS   sertraline  25 mg Oral QHS   Continuous Infusions:  0.9 % NaCl with KCl 20 mEq / L 75 mL/hr at 08/27/22 1057   ampicillin-sulbactam (UNASYN) IV 3 g (08/27/22 1637)   PRN Meds:.acetaminophen **OR** acetaminophen, morphine injection **OR** HYDROcodone-acetaminophen, ondansetron **OR** ondansetron (ZOFRAN) IV  Antimicrobials: Anti-infectives (From admission, onward)    Start     Dose/Rate Route Frequency Ordered Stop   08/27/22 1100  Ampicillin-Sulbactam (UNASYN) 3 g in sodium chloride 0.9 % 100 mL IVPB        3 g 200 mL/hr over 30 Minutes Intravenous Every 6 hours 08/27/22 1028 09/04/22 1059   08/27/22 0900  Ampicillin-Sulbactam (UNASYN) 3 g in sodium chloride 0.9 % 100 mL IVPB  Status:  Discontinued        3 g 200  mL/hr over 30 Minutes Intravenous Every 8 hours 08/27/22 0849 08/27/22 1028   08/25/22 1000  metroNIDAZOLE (FLAGYL) tablet 500 mg  Status:  Discontinued        500 mg Oral Every 12 hours 08/25/22 0810 08/27/22 0849   08/25/22 1000  cefTRIAXone (ROCEPHIN) 2 g in sodium chloride 0.9 % 100 mL IVPB  Status:  Discontinued        2 g 200 mL/hr over 30 Minutes Intravenous Every 24 hours 08/25/22 0822 08/27/22 0849   08/25/22 0815  cefTRIAXone (ROCEPHIN) 1 g in sodium chloride 0.9 % 100 mL IVPB  Status:  Discontinued        1 g 200 mL/hr over 30 Minutes Intravenous Every 24 hours 08/25/22 0810 08/25/22 0821   08/25/22 0315  ciprofloxacin (CIPRO) IVPB 400 mg        400 mg 200 mL/hr over 60 Minutes Intravenous  Once 08/25/22  0303 08/25/22 0415   08/25/22 0315  metroNIDAZOLE (FLAGYL) IVPB 500 mg        500 mg 100 mL/hr over 60 Minutes Intravenous  Once 08/25/22 0303 08/25/22 0405        I have personally reviewed the following labs and images: CBC: Recent Labs  Lab 08/24/22 2325 08/26/22 0634 08/27/22 0659  WBC 13.6* 16.7* 10.8*  NEUTROABS 10.7* 13.7*  --   HGB 10.8* 10.1* 9.6*  HCT 32.9* 30.9* 30.1*  MCV 102.2* 104.0* 104.5*  PLT 359 336 350   BMP &GFR Recent Labs  Lab 08/24/22 2325 08/26/22 0634 08/27/22 0659  NA 133* 130* 133*  K 3.5 3.5 3.1*  CL 98 98 100  CO2 '26 24 23  '$ GLUCOSE 172* 138* 117*  BUN '19 14 14  '$ CREATININE 0.85 0.77 0.80  CALCIUM 9.4 8.3* 8.4*  MG  --   --  1.8  PHOS  --   --  2.6   Estimated Creatinine Clearance: 37.9 mL/min (by C-G formula based on SCr of 0.8 mg/dL). Liver & Pancreas: Recent Labs  Lab 08/24/22 2325 08/26/22 0634 08/27/22 0659  AST 21 19  --   ALT 14 12  --   ALKPHOS 80 65  --   BILITOT 0.8 0.6  --   PROT 6.5 5.8*  --   ALBUMIN 3.4* 2.8* 2.9*   Recent Labs  Lab 08/24/22 2325  LIPASE 27   No results for input(s): "AMMONIA" in the last 168 hours. Diabetic: Recent Labs    08/25/22 0810  HGBA1C 7.1*   Recent Labs  Lab  08/26/22 1643 08/26/22 2205 08/27/22 0741 08/27/22 1135 08/27/22 1625  GLUCAP 140* 179* 115* 173* 239*   Cardiac Enzymes: No results for input(s): "CKTOTAL", "CKMB", "CKMBINDEX", "TROPONINI" in the last 168 hours. No results for input(s): "PROBNP" in the last 8760 hours. Coagulation Profile: No results for input(s): "INR", "PROTIME" in the last 168 hours. Thyroid Function Tests: No results for input(s): "TSH", "T4TOTAL", "FREET4", "T3FREE", "THYROIDAB" in the last 72 hours. Lipid Profile: No results for input(s): "CHOL", "HDL", "LDLCALC", "TRIG", "CHOLHDL", "LDLDIRECT" in the last 72 hours. Anemia Panel: No results for input(s): "VITAMINB12", "FOLATE", "FERRITIN", "TIBC", "IRON", "RETICCTPCT" in the last 72 hours. Urine analysis:    Component Value Date/Time   COLORURINE YELLOW 08/24/2022 2325   APPEARANCEUR CLEAR 08/24/2022 2325   LABSPEC 1.012 08/24/2022 2325   PHURINE 7.0 08/24/2022 2325   GLUCOSEU NEGATIVE 08/24/2022 2325   HGBUR NEGATIVE 08/24/2022 2325   BILIRUBINUR NEGATIVE 08/24/2022 2325   KETONESUR NEGATIVE 08/24/2022 2325   PROTEINUR NEGATIVE 08/24/2022 2325   NITRITE NEGATIVE 08/24/2022 2325   LEUKOCYTESUR NEGATIVE 08/24/2022 2325   Sepsis Labs: Invalid input(s): "PROCALCITONIN", "LACTICIDVEN"  Microbiology: No results found for this or any previous visit (from the past 240 hour(s)).  Radiology Studies: No results found.    Ula [facility]  If 7PM-7AM, please contact night-coverage [email or web address] 08/27/2022, 5:06 PM

## 2022-08-27 NOTE — Progress Notes (Signed)
PHARMACY NOTE:  ANTIMICROBIAL RENAL DOSAGE ADJUSTMENT  Current antimicrobial regimen includes a mismatch between antimicrobial dosage and estimated renal function.  As per policy approved by the Pharmacy & Therapeutics and Medical Executive Committees, the antimicrobial dosage will be adjusted accordingly.  Current antimicrobial dosage:  unasyn 3gm q8h  Indication: intra-abdominal infection  Renal Function:  Estimated Creatinine Clearance: 37.9 mL/min (by C-G formula based on SCr of 0.8 mg/dL). '[]'$      On intermittent HD, scheduled: '[]'$      On CRRT    Antimicrobial dosage has been changed to:  unasyn 3gm q6h    Thank you for allowing pharmacy to be a part of this patient's care.  Lynelle Doctor, [facility] 08/27/2022 10:29 AM

## 2022-08-27 NOTE — Progress Notes (Signed)
Mobility Specialist - Progress Note   08/27/22 1436  Mobility  HOB Elevated/Bed Position Self regulated  Activity Ambulated with assistance in hallway;Ambulated with assistance to bathroom  Range of Motion/Exercises Active  Level of Assistance Standby assist, set-up cues, supervision of patient - no hands on  Assistive Device Front wheel walker  Distance Ambulated (ft) 120 ft  Activity Response Tolerated well  Transport method Ambulatory  $Mobility charge 1 Mobility   Pt received in bed and agreeable to mobility. No complaints during ambulation. Pt to bathroom after session with all needs met & NT notified.   [facility]

## 2022-08-27 NOTE — Progress Notes (Signed)
Mobility Specialist - Progress Note   08/27/22 1105  Mobility  HOB Elevated/Bed Position Self regulated  Activity Ambulated with assistance in hallway  Range of Motion/Exercises Active  Level of Assistance Contact guard assist, steadying assist  Assistive Device None  Distance Ambulated (ft) 500 ft  Activity Response Tolerated well  Transport method Ambulatory  $Mobility charge 1 Mobility   Pt received in bed and agreeable to mobility. Pt unsteady during ambulation. Suggested to pt to use the walker, but instead wanted to hold on to the wall. Pt took one standing rest break due to feeling dizzy at EOS. Pt to bed after session with all needs met & call bell in reach.   [facility]

## 2022-08-28 DIAGNOSIS — K6289 Other specified diseases of anus and rectum: Secondary | ICD-10-CM

## 2022-08-28 DIAGNOSIS — R634 Abnormal weight loss: Secondary | ICD-10-CM | POA: Diagnosis not present

## 2022-08-28 DIAGNOSIS — I071 Rheumatic tricuspid insufficiency: Secondary | ICD-10-CM | POA: Diagnosis not present

## 2022-08-28 DIAGNOSIS — I2510 Atherosclerotic heart disease of native coronary artery without angina pectoris: Secondary | ICD-10-CM | POA: Diagnosis not present

## 2022-08-28 DIAGNOSIS — K5792 Diverticulitis of intestine, part unspecified, without perforation or abscess without bleeding: Secondary | ICD-10-CM | POA: Diagnosis not present

## 2022-08-28 LAB — RETICULOCYTES
Immature Retic Fract: 28.8 % — ABNORMAL HIGH (ref 2.3–15.9)
RBC.: 2.71 MIL/uL — ABNORMAL LOW (ref 3.87–5.11)
Retic Count, Absolute: 40.4 10*3/uL (ref 19.0–186.0)
Retic Ct Pct: 1.5 % (ref 0.4–3.1)

## 2022-08-28 LAB — IRON AND TIBC
Iron: 38 ug/dL (ref 28–170)
Saturation Ratios: 15 % (ref 10.4–31.8)
TIBC: 259 ug/dL (ref 250–450)
UIBC: 221 ug/dL

## 2022-08-28 LAB — CBC
HCT: 28.5 % — ABNORMAL LOW (ref 36.0–46.0)
Hemoglobin: 9.1 g/dL — ABNORMAL LOW (ref 12.0–15.0)
MCH: 33.3 pg (ref 26.0–34.0)
MCHC: 31.9 g/dL (ref 30.0–36.0)
MCV: 104.4 fL — ABNORMAL HIGH (ref 80.0–100.0)
Platelets: 348 10*3/uL (ref 150–400)
RBC: 2.73 MIL/uL — ABNORMAL LOW (ref 3.87–5.11)
RDW: 13.2 % (ref 11.5–15.5)
WBC: 7.1 10*3/uL (ref 4.0–10.5)
nRBC: 0 % (ref 0.0–0.2)

## 2022-08-28 LAB — RENAL FUNCTION PANEL
Albumin: 2.7 g/dL — ABNORMAL LOW (ref 3.5–5.0)
Anion gap: 7 (ref 5–15)
BUN: 15 mg/dL (ref 8–23)
CO2: 22 mmol/L (ref 22–32)
Calcium: 7.9 mg/dL — ABNORMAL LOW (ref 8.9–10.3)
Chloride: 108 mmol/L (ref 98–111)
Creatinine, Ser: 0.74 mg/dL (ref 0.44–1.00)
GFR, Estimated: >60 mL/min (ref >60)
Glucose, Bld: 124 mg/dL — ABNORMAL HIGH (ref 70–99)
Phosphorus: 1.8 mg/dL — ABNORMAL LOW (ref 2.5–4.6)
Potassium: 4.5 mmol/L (ref 3.5–5.1)
Sodium: 137 mmol/L (ref 135–145)

## 2022-08-28 LAB — GLUCOSE, CAPILLARY
Glucose-Capillary: 124 mg/dL — ABNORMAL HIGH (ref 70–99)
Glucose-Capillary: 147 mg/dL — ABNORMAL HIGH (ref 70–99)
Glucose-Capillary: 158 mg/dL — ABNORMAL HIGH (ref 70–99)

## 2022-08-28 LAB — VITAMIN B12: Vitamin B-12: 851 pg/mL (ref 180–914)

## 2022-08-28 LAB — MAGNESIUM: Magnesium: 1.7 mg/dL (ref 1.7–2.4)

## 2022-08-28 LAB — FOLATE: Folate: 21.5 ng/mL (ref >5.9)

## 2022-08-28 LAB — FERRITIN: Ferritin: 86 ng/mL (ref 11–307)

## 2022-08-28 MED ORDER — SODIUM PHOSPHATES 45 MMOLE/15ML IV SOLN
30.0000 mmol | Freq: Once | INTRAVENOUS | Status: AC
  Administered 2022-08-28: 30 mmol via INTRAVENOUS
  Filled 2022-08-28 (×2): qty 10

## 2022-08-28 MED ORDER — AMOXICILLIN-POT CLAVULANATE 875-125 MG PO TABS: 1.0000 | ORAL_TABLET | Freq: Two times a day (BID) | ORAL | 0 refills | Status: DC

## 2022-08-28 MED ORDER — FUROSEMIDE 20 MG PO TABS: 20.0000 mg | ORAL_TABLET | Freq: Every day | ORAL | 0 refills | Status: DC

## 2022-08-28 NOTE — Progress Notes (Signed)
Mobility Specialist - Progress Note   08/28/22 1014  Mobility  Activity Ambulated with assistance in hallway  Level of Assistance Standby assist, set-up cues, supervision of patient - no hands on  Assistive Device Front wheel walker  Distance Ambulated (ft) 200 ft  Activity Response Tolerated well  $Mobility charge 1 Mobility   Pt received in bed and agreed for mobility, no c/o pain or discomfort. Pt took one standing rest break then to restroom, RN notified and Pt made aware to notify staff when ready to leave restroom.   Roderick Pee Mobility Specialist

## 2022-08-28 NOTE — TOC Transition Note (Signed)
Transition of Care [facility]) - CM/SW Discharge Note   Patient Details  Name: Heather Olsen MRN: [id number] Date of Birth: [birth year]  [facility] CM/SW Contact:  Henrietta Dine, RN Phone Number: 08/28/2022, 11:07 AM   Clinical Narrative:    Patient will be going home with home health through [place]. TOC signing off please reconsult with any other social work needs, home health agency has been notified of planned discharge. Spoke with [facility].  Final next level of care: [place] ([facility] with home health) Barriers to Discharge: No Barriers Identified   Patient Goals and CMS Choice Patient states their goals for this hospitalization and ongoing recovery are:: to go home CMS Medicare.gov Compare Post Acute Care list provided to:: Patient Choice offered to / list presented to : Patient  Discharge Placement                       Discharge Plan and Services   Discharge Planning Services: CM Consult                      HH Arranged: PT [facility] Agency: [place] ([place]) Date [place]: 08/28/22 Time [place]: [phone] Representative spoke with at [place]: Floydene Flock  Social Determinants of Health (SDOH) Interventions     Readmission Risk Interventions     No data to display

## 2022-08-28 NOTE — Discharge Summary (Signed)
Physician Discharge Summary  Heather Olsen TMA:[id number] DOB: [birth year] DOA: 08/24/2022  PCP: Deland Pretty, MD  Admit date: 08/24/2022 Discharge date: 08/28/2022 Admitted From: [facility] independent living facility Disposition: [facility] independent living facility Recommendations for Outpatient Follow-up:  Follow up with PCP in 1 to 2 weeks Check BMP and CBC in 1 to 2 weeks Outpatient follow-up with gastroenterology in 4 to 6 weeks Please follow up on the following pending results: None  Home Health: HH PT Equipment/Devices: None  Discharge Condition: Stable CODE STATUS: Full code  [place], [facility] Follow up.   Why: [facility] has been set for home health. They will call you to set up the first visit. Contact information: [place] [address] [phone]         Deland Pretty, MD. Schedule an appointment as soon as possible for a visit in 1 week(s).   Specialty: Internal Medicine Contact information: [address] [place] [address] [phone]                 Hospital course 88 year old F with PMH of CKD-3A, DM-2, CAD, paroxysmal A-fib on Eliquis, HLD and remote history of diverticulitis presenting with nausea and LLQ pain, and found to have acute sigmoid diverticulitis without complication.  She was started on IV ceftriaxone and Flagyl.    Abdominal pain improved.  Transitioned to IV Unasyn and advanced to soft diet on 9/30.  She tolerated soft diet.  Abdominal pain improved.  Leukocytosis resolved.  Discharged on p.o. Augmentin for 7 more days to complete a total of 10 days course.  Recommended low fiber residue diet and holding Lasix for 4 days.  Recommend repeat BMP and CBC in 1 to 2 weeks.  Needs colonoscopy once infection is treated completely.    Hypophosphatemia replenished prior to discharge.  See individual problem list below for more.   Problems  addressed during this hospitalization Principal Problem:   Acute diverticulitis Active Problems:   HLD (hyperlipidemia)   Atherosclerotic heart disease of native coronary artery without angina pectoris   PAF (paroxysmal atrial fibrillation) (HCC)   Type 2 diabetes mellitus with unspecified complications ([facility])   Long term (current) use of anticoagulants   Raynaud's disease   Tricuspid insufficiency   Restless legs syndrome   Chronic diastolic CHF (congestive heart failure) ([facility])   Unintentional weight loss   Proctitis   Hypophosphatemia            Vital signs Vitals:   08/28/22 0826 08/28/22 0955 08/28/22 1400 08/28/22 1412  BP: (!) 176/90 137/63 131/71   Pulse: 73 67 69   Temp:   98.5 F (36.9 C)   Resp:   (!) 32 Comment: 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  Nontoxic. HEENT: MMM.  Vision and hearing grossly intact.  NECK: Supple.  No apparent JVD.  RESP:  No IWOB.  Fair aeration bilaterally. CVS:  RRR. Heart sounds normal.  ABD/GI/GU: BS+. Abd soft, NTND MSK/EXT:  Moves extremities. No apparent deformity. No edema.  SKIN: no apparent skin lesion or wound NEURO: Awake and alert. Oriented appropriately.  No apparent focal neuro deficit. PSYCH: Calm. Normal affect.   Discharge Instructions  Discharge Instructions     Call MD for:  extreme fatigue   Complete by: As directed    Call MD for:  persistant dizziness or light-headedness   Complete by: As directed    Call MD for:  persistant nausea and vomiting   Complete by: As directed    Call MD for:  severe uncontrolled pain   Complete by: As directed    Diet - low sodium heart healthy   Complete by: As directed    Diet Carb Modified   Complete by: As directed    Discharge instructions   Complete by: As directed    It has been a pleasure taking care of you!  You were  hospitalized due to acute diverticulitis (infection of your colon) and proctitis (infection and inflammation of your rectum) for which you have been started on IV antibiotics.  We are discharging you more antibiotics to complete treatment course.  Your symptoms improved to the point we think it is safe to let you go home and follow-up with your primary care doctor.  It is very important that you follow-up with gastroenterology in 4 to 6 weeks for colonoscopy and further evaluation.  Continue low fiber residue diet for the next 3 to 4 days.  We recommend holding your Macrobid (nitrofurantoin) until you finish your Augmentin.   Take care,   Increase activity slowly   Complete by: As directed       Allergies as of 08/28/2022       Reactions   Topiramate Rash   Ace Inhibitors Cough   Codeine Nausea Only   Ibandronic Acid Nausea Only   Meloxicam    Other reaction(s): GI upset, GI Upset (intolerance), Other (See Comments) unknown   Prevacid [lansoprazole]    Chest pain   Sulfa Antibiotics Other (See Comments)   headaches   Verapamil Other (See Comments)   constipation   Zolpidem Other (See Comments)   hallucinations        Medication List     TAKE these medications    acetaminophen 500 MG tablet Commonly known as: TYLENOL Take 500-1,000 mg by mouth every 6 (six) hours as needed for moderate pain.   amoxicillin-clavulanate 875-125 MG tablet Commonly known as: AUGMENTIN Take 1 tablet by mouth 2 (two) times daily for 7 days.   celecoxib 200 MG capsule Commonly known as: CELEBREX TAKE 1 CAPSULE BY MOUTH EVERY DAY   CULTURELLE DIGESTIVE HEALTH PO Take 1 capsule by mouth every evening. Take one daily for probiotic   Eliquis 2.5 MG Tabs tablet Generic drug: apixaban Take 2.5 mg by mouth 2 (two) times daily.   famotidine 40 MG tablet Commonly known as: PEPCID Take 40 mg by mouth at bedtime.   furosemide 20 MG tablet Commonly known as: LASIX Take 1 tablet (20 mg total)  by mouth daily. Start taking on: September 01, 2022 What changed: These instructions start on September 01, 2022. If you are unsure what to do until then, ask your doctor or other care provider.   ipratropium 0.06 % nasal spray Commonly known as: ATROVENT Use 2 sprays in each nostril up to three times daily before meals for sinus drainage. What changed:  how much to take how to take this when to take this additional instructions   Linzess 145 MCG Caps capsule Generic drug: linaclotide Take 145 mcg by mouth daily as needed (constipation).   metFORMIN 500 MG 24 hr tablet Commonly known as: GLUCOPHAGE-XR Take 1 tablet by mouth every  evening.   mirabegron ER 50 MG Tb24 tablet Commonly known as: MYRBETRIQ Take 1 tablet by mouth daily.   MULTIPLE VITAMINS-MINERALS PO Take 1 tablet by mouth daily.   neomycin-polymyxin b-dexamethasone 3.5-10000-0.1 Oint Commonly known as: MAXITROL Place into the left eye 2 (two) times daily.   nitrofurantoin (macrocrystal-monohydrate) 100 MG capsule Commonly known as: MACROBID Take 100 mg by mouth daily.   nitroGLYCERIN 0.4 MG SL tablet Commonly known as: NITROSTAT Place 1 tablet (0.4 mg total) under the tongue every 5 (five) minutes as needed for chest pain. Place one tablet under the tongue every 5 minutes as needed for chest pain. No more than 3   ondansetron 4 MG disintegrating tablet Commonly known as: ZOFRAN-ODT '4mg'$  ODT q4 hours prn nausea/vomit   ondansetron 4 MG tablet Commonly known as: ZOFRAN Take 4 mg by mouth every 8 (eight) hours as needed for nausea or vomiting.   pantoprazole 40 MG tablet Commonly known as: PROTONIX Take 40 mg by mouth 2 (two) times daily.   pindolol 5 MG tablet Commonly known as: VISKEN Take 1/2 (one-half) tablet by mouth twice daily What changed: See the new instructions.   PROBIOTIC DAILY PO Take 1 capsule by mouth every evening.   Prolia 60 MG/ML Sosy injection Generic drug: denosumab Inject 60 mg  into the skin every 6 (six) months.   rOPINIRole 0.5 MG tablet Commonly known as: REQUIP Take 0.5 mg by mouth at bedtime.   rosuvastatin 10 MG tablet Commonly known as: CRESTOR TAKE 1 TABLET BY MOUTH AT BEDTIME   sertraline 25 MG tablet Commonly known as: ZOLOFT Take 25 mg by mouth at bedtime.   traMADol 50 MG tablet Commonly known as: ULTRAM TAKE 1 TABLET BY MOUTH EVERY 4 HOURS What changed: See the new instructions.   Vitamin D3 50 MCG (2000 UT) capsule Take 2,000 Units by mouth daily.        Consultations: None  Procedures/Studies:   CT Abdomen Pelvis W Contrast  Result Date: 08/25/2022 CLINICAL DATA:  Abdominal pain for 1 month EXAM: CT ABDOMEN AND PELVIS WITH CONTRAST TECHNIQUE: Multidetector CT imaging of the abdomen and pelvis was performed using the standard protocol following bolus administration of intravenous contrast. RADIATION DOSE REDUCTION: This exam was performed according to the departmental dose-optimization program which includes automated exposure control, adjustment of the mA and/or kV according to patient size and/or use of iterative reconstruction technique. CONTRAST:  49m OMNIPAQUE IOHEXOL 300 MG/ML  SOLN COMPARISON:  CT abdomen and pelvis 07/11/2022 FINDINGS: Lower chest: Cardiomegaly. Marked basilar atelectasis/scarring. No acute abnormality. Hepatobiliary: No suspicious focal hepatic lesion. No evidence of cholecystitis. No gallstones. No biliary dilation. Pancreas: Unremarkable. Spleen: Unremarkable. Adrenals/Urinary Tract: Adrenal glands are unremarkable. Cortical renal scarring in both kidneys. Multiple small hypoattenuating lesions statistically likely to be cysts. No follow-up is required. No urinary calculi. Unchanged mild right hydroureter. Distended bladder. Stomach/Bowel: Wall thickening and adjacent stranding about the sigmoid colon and rectum where there are numerous diverticula. Small amount of free fluid in the pelvis. No free intraperitoneal  air. Normal caliber large and small bowel. Wall thickening of the stomach may be due to decompression though gastritis is not excluded. Vascular/Lymphatic: Aortic atherosclerosis. No enlarged abdominal or pelvic lymph nodes. Reproductive: Uterus and bilateral adnexa are unremarkable. Other: No abdominal wall hernia or abnormality. Musculoskeletal: Scoliosis. Degenerative changes in the hips and spine. No acute osseous abnormality. IMPRESSION: Acute sigmoid diverticulitis. No drainable fluid collection or free intraperitoneal air. Proctitis may be reactive, secondary to diverticulitis. Aortic  Atherosclerosis (ICD10-I70.0). Electronically Signed   By: Placido Sou M.D.   On: 08/25/2022 02:36   DG Abd Acute W/Chest  Result Date: 08/24/2022 CLINICAL DATA:  Abdominal pain EXAM: DG ABDOMEN ACUTE WITH 1 VIEW CHEST COMPARISON:  CT and chest x-ray 07/11/2022. FINDINGS: Heart is borderline in size. Aortic atherosclerosis. Lungs clear. No effusions. Severe thoracolumbar scoliosis and degenerative changes. Nonobstructive bowel gas pattern. No organomegaly, free air or suspicious calcification. IMPRESSION: No acute findings. Electronically Signed   By: Rolm Baptise M.D.   On: 08/24/2022 23:59   ECHOCARDIOGRAM COMPLETE  Result Date: 08/23/2022    ECHOCARDIOGRAM REPORT   Patient Name:   Heather Olsen Date of Exam: 08/23/2022 Medical Rec #:  [id number]   Height:       64.0 in Accession #:    [id number]  Weight:       110.4 lb Date of Birth:  Nov 24, 1931   BSA:          1.520 m Patient Age:    69 years    BP:           118/72 mmHg Patient Gender: F           HR:           66 bpm. Exam Location:  Outpatient Procedure: 2D Echo, 3D Echo, Cardiac Doppler, Color Doppler and Strain Analysis Indications:    shortness of breath  History:        Patient has prior history of Echocardiogram examinations, most                 recent 08/13/2020. CAD, Arrythmias:Atrial Fibrillation; Risk                 Factors:Dyslipidemia, Non-Smoker and  Diabetes.  Sonographer:    Leavy Cella RDCS Referring Phys: [place]  1. Left ventricular ejection fraction, by estimation, is 60 to 65%. The left ventricle has normal function. The left ventricle has no regional wall motion abnormalities. Left ventricular diastolic parameters are consistent with Grade II diastolic dysfunction (pseudonormalization). Elevated left atrial pressure. The average left ventricular global longitudinal strain is -16.4 %. The global longitudinal strain is abnormal.  2. Right ventricular systolic function is normal. The right ventricular size is normal.  3. Left atrial size was mildly dilated.  4. Right atrial size was moderately dilated.  5. The mitral valve is normal in structure. No evidence of mitral valve regurgitation. No evidence of mitral stenosis.  6. Tricuspid valve regurgitation is moderate.  7. The aortic valve is tricuspid. Aortic valve regurgitation is not visualized. No aortic stenosis is present.  8. Aortic dilatation noted. There is mild dilatation of the ascending aorta, measuring 40 mm.  9. The inferior vena cava is normal in size with greater than 50% respiratory variability, suggesting right atrial pressure of 3 mmHg. FINDINGS  Left Ventricle: Left ventricular ejection fraction, by estimation, is 60 to 65%. The left ventricle has normal function. The left ventricle has no regional wall motion abnormalities. The average left ventricular global longitudinal strain is -16.4 %. The global longitudinal strain is abnormal. The left ventricular internal cavity size was normal in size. There is no left ventricular hypertrophy. Left ventricular diastolic parameters are consistent with Grade II diastolic dysfunction (pseudonormalization). Elevated left atrial pressure. Right Ventricle: The right ventricular size is normal. Right ventricular systolic function is normal. Left Atrium: Left atrial size was mildly dilated. Right Atrium: Right atrial size was  moderately dilated. Pericardium: There is no  evidence of pericardial effusion. Mitral Valve: The mitral valve is normal in structure. No evidence of mitral valve regurgitation. No evidence of mitral valve stenosis. Tricuspid Valve: The tricuspid valve is normal in structure. Tricuspid valve regurgitation is moderate . No evidence of tricuspid stenosis. Aortic Valve: The aortic valve is tricuspid. Aortic valve regurgitation is not visualized. No aortic stenosis is present. Pulmonic Valve: The pulmonic valve was normal in structure. Pulmonic valve regurgitation is not visualized. No evidence of pulmonic stenosis. Aorta: Aortic dilatation noted. There is mild dilatation of the ascending aorta, measuring 40 mm. Venous: The inferior vena cava is normal in size with greater than 50% respiratory variability, suggesting right atrial pressure of 3 mmHg. IAS/Shunts: No atrial level shunt detected by color flow Doppler.  LEFT VENTRICLE PLAX 2D LVIDd:         3.03 cm   Diastology LVIDs:         1.69 cm   LV e' medial:    4.13 cm/s LV PW:         1.16 cm   LV E/e' medial:  27.4 LV IVS:        0.89 cm   LV e' lateral:   6.96 cm/s LVOT diam:     1.80 cm   LV E/e' lateral: 16.2 LV SV:         55 LV SV Index:   37        2D Longitudinal Strain LVOT Area:     2.54 cm  2D Strain GLS Avg:     -16.4 %                           3D Volume EF:                          3D EF:        56 %                          LV EDV:       74 ml                          LV ESV:       33 ml                          LV SV:        41 ml RIGHT VENTRICLE RV Basal diam:  4.01 cm RV Mid diam:    3.40 cm RV S prime:     14.30 cm/s TAPSE (M-mode): 2.1 cm LEFT ATRIUM           Index        RIGHT ATRIUM           Index LA diam:      2.00 cm 1.32 cm/m   RA Area:     22.90 cm LA Vol (A2C): 20.7 ml 13.62 ml/m  RA Volume:   72.00 ml  47.38 ml/m LA Vol (A4C): 43.7 ml 28.75 ml/m  AORTIC VALVE LVOT Vmax:   88.70 cm/s LVOT Vmean:  57.500 cm/s LVOT VTI:    0.218 m   AORTA Ao Root diam: 3.10 cm Ao Asc diam:  4.00 cm MITRAL VALVE                TRICUSPID  VALVE MV Area (PHT): 4.21 cm     TR Peak grad:   32.9 mmHg MV Decel Time: 180 msec     TR Vmax:        287.00 cm/s MV E velocity: 113.00 cm/s MV A velocity: 89.70 cm/s   SHUNTS MV E/A ratio:  1.26         Systemic VTI:  0.22 m                             Systemic Diam: 1.80 cm Kirk Ruths MD Electronically signed by Kirk Ruths MD Signature Date/Time: 08/23/2022/3:34:07 PM    Final        The results of significant diagnostics from this hospitalization (including imaging, microbiology, ancillary and laboratory) are listed below for reference.     Microbiology: No results found for this or any previous visit (from the past 240 hour(s)).   Labs:  CBC: Recent Labs  Lab 08/24/22 2325 08/26/22 0634 08/27/22 0659 08/28/22 0647  WBC 13.6* 16.7* 10.8* 7.1  NEUTROABS 10.7* 13.7*  --   --   HGB 10.8* 10.1* 9.6* 9.1*  HCT 32.9* 30.9* 30.1* 28.5*  MCV 102.2* 104.0* 104.5* 104.4*  PLT 359 336 350 348   BMP &GFR Recent Labs  Lab 08/24/22 2325 08/26/22 0634 08/27/22 0659 08/28/22 0647  NA 133* 130* 133* 137  K 3.5 3.5 3.1* 4.5  CL 98 98 100 108  CO2 '26 24 23 22  '$ GLUCOSE 172* 138* 117* 124*  BUN '19 14 14 15  '$ CREATININE 0.85 0.77 0.80 0.74  CALCIUM 9.4 8.3* 8.4* 7.9*  MG  --   --  1.8 1.7  PHOS  --   --  2.6 1.8*   Estimated Creatinine Clearance: 37.9 mL/min (by C-G formula based on SCr of 0.74 mg/dL). Liver & Pancreas: Recent Labs  Lab 08/24/22 2325 08/26/22 0634 08/27/22 0659 08/28/22 0647  AST 21 19  --   --   ALT 14 12  --   --   ALKPHOS 80 65  --   --   BILITOT 0.8 0.6  --   --   PROT 6.5 5.8*  --   --   ALBUMIN 3.4* 2.8* 2.9* 2.7*   Recent Labs  Lab 08/24/22 2325  LIPASE 27   No results for input(s): "AMMONIA" in the last 168 hours. Diabetic: No results for input(s): "HGBA1C" in the last 72 hours. Recent Labs  Lab 08/27/22 1135 08/27/22 1625 08/27/22 2104  08/28/22 0718 08/28/22 1139  GLUCAP 173* 239* 205* 124* 147*   Cardiac Enzymes: No results for input(s): "CKTOTAL", "CKMB", "CKMBINDEX", "TROPONINI" in the last 168 hours. No results for input(s): "PROBNP" in the last 8760 hours. Coagulation Profile: No results for input(s): "INR", "PROTIME" in the last 168 hours. Thyroid Function Tests: No results for input(s): "TSH", "T4TOTAL", "FREET4", "T3FREE", "THYROIDAB" in the last 72 hours. Lipid Profile: No results for input(s): "CHOL", "HDL", "LDLCALC", "TRIG", "CHOLHDL", "LDLDIRECT" in the last 72 hours. Anemia Panel: Recent Labs    08/28/22 0647  VITAMINB12 851  FOLATE 21.5  FERRITIN 86  TIBC 259  IRON 38  RETICCTPCT 1.5   Urine analysis:    Component Value Date/Time   COLORURINE YELLOW 08/24/2022 2325   APPEARANCEUR CLEAR 08/24/2022 2325   LABSPEC 1.012 08/24/2022 2325   PHURINE 7.0 08/24/2022 2325   GLUCOSEU NEGATIVE 08/24/2022 2325   HGBUR NEGATIVE 08/24/2022 2325   BILIRUBINUR NEGATIVE 08/24/2022 2325  KETONESUR NEGATIVE 08/24/2022 2325   PROTEINUR NEGATIVE 08/24/2022 2325   NITRITE NEGATIVE 08/24/2022 2325   LEUKOCYTESUR NEGATIVE 08/24/2022 2325   Sepsis Labs: Invalid input(s): "PROCALCITONIN", "LACTICIDVEN"   SIGNED:  Mercy Riding, MD  [facility] 08/28/2022, 4:01 PM

## 2022-08-28 NOTE — Progress Notes (Signed)
Patient is being discharged home. Discharge instructions reviewed including new medications. Patient verbalized a full understanding of instructions given. Patient family friend is picking her up.

## 2022-08-30 ENCOUNTER — Encounter (HOSPITAL_COMMUNITY): Payer: Self-pay

## 2022-08-30 ENCOUNTER — Other Ambulatory Visit: Payer: Self-pay

## 2022-08-30 ENCOUNTER — Inpatient Hospital Stay (HOSPITAL_COMMUNITY)
Admission: EM | Admit: 2022-08-30 | Discharge: 2022-09-01 | DRG: 291 | Disposition: A | Discharge status: Home Health | Payer: Medicare Other | Attending: Internal Medicine | Admitting: Internal Medicine

## 2022-08-30 ENCOUNTER — Emergency Department (HOSPITAL_COMMUNITY): Payer: Medicare Other

## 2022-08-30 DIAGNOSIS — Z882 Allergy status to sulfonamides status: Secondary | ICD-10-CM | POA: Diagnosis not present

## 2022-08-30 DIAGNOSIS — E119 Type 2 diabetes mellitus without complications: Secondary | ICD-10-CM | POA: Diagnosis present

## 2022-08-30 DIAGNOSIS — H9190 Unspecified hearing loss, unspecified ear: Secondary | ICD-10-CM | POA: Diagnosis present

## 2022-08-30 DIAGNOSIS — I080 Rheumatic disorders of both mitral and aortic valves: Secondary | ICD-10-CM | POA: Diagnosis present

## 2022-08-30 DIAGNOSIS — K5732 Diverticulitis of large intestine without perforation or abscess without bleeding: Secondary | ICD-10-CM | POA: Diagnosis present

## 2022-08-30 DIAGNOSIS — Z8249 Family history of ischemic heart disease and other diseases of the circulatory system: Secondary | ICD-10-CM

## 2022-08-30 DIAGNOSIS — E876 Hypokalemia: Secondary | ICD-10-CM | POA: Diagnosis not present

## 2022-08-30 DIAGNOSIS — Z885 Allergy status to narcotic agent status: Secondary | ICD-10-CM | POA: Diagnosis not present

## 2022-08-30 DIAGNOSIS — Z7984 Long term (current) use of oral hypoglycemic drugs: Secondary | ICD-10-CM | POA: Diagnosis not present

## 2022-08-30 DIAGNOSIS — R0602 Shortness of breath: Secondary | ICD-10-CM | POA: Diagnosis not present

## 2022-08-30 DIAGNOSIS — M810 Age-related osteoporosis without current pathological fracture: Secondary | ICD-10-CM | POA: Diagnosis present

## 2022-08-30 DIAGNOSIS — R0902 Hypoxemia: Secondary | ICD-10-CM | POA: Diagnosis present

## 2022-08-30 DIAGNOSIS — I110 Hypertensive heart disease with heart failure: Secondary | ICD-10-CM | POA: Diagnosis not present

## 2022-08-30 DIAGNOSIS — Z888 Allergy status to other drugs, medicaments and biological substances status: Secondary | ICD-10-CM | POA: Diagnosis not present

## 2022-08-30 DIAGNOSIS — K219 Gastro-esophageal reflux disease without esophagitis: Secondary | ICD-10-CM | POA: Diagnosis not present

## 2022-08-30 DIAGNOSIS — R609 Edema, unspecified: Secondary | ICD-10-CM | POA: Diagnosis not present

## 2022-08-30 DIAGNOSIS — I5033 Acute on chronic diastolic (congestive) heart failure: Secondary | ICD-10-CM | POA: Diagnosis not present

## 2022-08-30 DIAGNOSIS — K5792 Diverticulitis of intestine, part unspecified, without perforation or abscess without bleeding: Secondary | ICD-10-CM | POA: Diagnosis not present

## 2022-08-30 DIAGNOSIS — I272 Pulmonary hypertension, unspecified: Secondary | ICD-10-CM | POA: Diagnosis present

## 2022-08-30 DIAGNOSIS — Z1152 Encounter for screening for COVID-19: Secondary | ICD-10-CM

## 2022-08-30 DIAGNOSIS — M4120 Other idiopathic scoliosis, site unspecified: Secondary | ICD-10-CM | POA: Diagnosis present

## 2022-08-30 DIAGNOSIS — E871 Hypo-osmolality and hyponatremia: Secondary | ICD-10-CM | POA: Diagnosis present

## 2022-08-30 DIAGNOSIS — I7300 Raynaud's syndrome without gangrene: Secondary | ICD-10-CM | POA: Diagnosis present

## 2022-08-30 DIAGNOSIS — I509 Heart failure, unspecified: Secondary | ICD-10-CM | POA: Diagnosis not present

## 2022-08-30 DIAGNOSIS — K449 Diaphragmatic hernia without obstruction or gangrene: Secondary | ICD-10-CM | POA: Diagnosis present

## 2022-08-30 DIAGNOSIS — R197 Diarrhea, unspecified: Secondary | ICD-10-CM | POA: Diagnosis not present

## 2022-08-30 DIAGNOSIS — E1129 Type 2 diabetes mellitus with other diabetic kidney complication: Secondary | ICD-10-CM | POA: Diagnosis present

## 2022-08-30 DIAGNOSIS — I5022 Chronic systolic (congestive) heart failure: Secondary | ICD-10-CM | POA: Diagnosis not present

## 2022-08-30 DIAGNOSIS — I2510 Atherosclerotic heart disease of native coronary artery without angina pectoris: Secondary | ICD-10-CM | POA: Diagnosis present

## 2022-08-30 DIAGNOSIS — M48061 Spinal stenosis, lumbar region without neurogenic claudication: Secondary | ICD-10-CM | POA: Diagnosis present

## 2022-08-30 DIAGNOSIS — Z7901 Long term (current) use of anticoagulants: Secondary | ICD-10-CM

## 2022-08-30 DIAGNOSIS — E118 Type 2 diabetes mellitus with unspecified complications: Secondary | ICD-10-CM

## 2022-08-30 DIAGNOSIS — J90 Pleural effusion, not elsewhere classified: Secondary | ICD-10-CM | POA: Diagnosis not present

## 2022-08-30 DIAGNOSIS — I10 Essential (primary) hypertension: Secondary | ICD-10-CM | POA: Diagnosis not present

## 2022-08-30 DIAGNOSIS — I480 Paroxysmal atrial fibrillation: Secondary | ICD-10-CM | POA: Diagnosis not present

## 2022-08-30 DIAGNOSIS — R079 Chest pain, unspecified: Secondary | ICD-10-CM | POA: Diagnosis not present

## 2022-08-30 DIAGNOSIS — E785 Hyperlipidemia, unspecified: Secondary | ICD-10-CM | POA: Diagnosis not present

## 2022-08-30 DIAGNOSIS — Z79899 Other long term (current) drug therapy: Secondary | ICD-10-CM | POA: Diagnosis not present

## 2022-08-30 LAB — BASIC METABOLIC PANEL
Anion gap: 9 (ref 5–15)
BUN: 9 mg/dL (ref 8–23)
CO2: 22 mmol/L (ref 22–32)
Calcium: 8.8 mg/dL — ABNORMAL LOW (ref 8.9–10.3)
Chloride: 103 mmol/L (ref 98–111)
Creatinine, Ser: 0.72 mg/dL (ref 0.44–1.00)
GFR, Estimated: >60 mL/min (ref >60)
Glucose, Bld: 166 mg/dL — ABNORMAL HIGH (ref 70–99)
Potassium: 3.9 mmol/L (ref 3.5–5.1)
Sodium: 134 mmol/L — ABNORMAL LOW (ref 135–145)

## 2022-08-30 LAB — CBC
HCT: 29.6 % — ABNORMAL LOW (ref 36.0–46.0)
Hemoglobin: 9.9 g/dL — ABNORMAL LOW (ref 12.0–15.0)
MCH: 33.8 pg (ref 26.0–34.0)
MCHC: 33.4 g/dL (ref 30.0–36.0)
MCV: 101 fL — ABNORMAL HIGH (ref 80.0–100.0)
Platelets: 365 10*3/uL (ref 150–400)
RBC: 2.93 MIL/uL — ABNORMAL LOW (ref 3.87–5.11)
RDW: 13.2 % (ref 11.5–15.5)
WBC: 8.2 10*3/uL (ref 4.0–10.5)
nRBC: 0 % (ref 0.0–0.2)

## 2022-08-30 LAB — SARS CORONAVIRUS 2 BY RT PCR: SARS Coronavirus 2 by RT PCR: NEGATIVE

## 2022-08-30 LAB — TROPONIN I (HIGH SENSITIVITY)
Troponin I (High Sensitivity): 39 ng/L — ABNORMAL HIGH (ref <18)
Troponin I (High Sensitivity): 35 ng/L — ABNORMAL HIGH (ref <18)

## 2022-08-30 LAB — BRAIN NATRIURETIC PEPTIDE: B Natriuretic Peptide: 1103.7 pg/mL — ABNORMAL HIGH (ref 0.0–100.0)

## 2022-08-30 LAB — CBG MONITORING, ED: Glucose-Capillary: 154 mg/dL — ABNORMAL HIGH (ref 70–99)

## 2022-08-30 MED ORDER — METRONIDAZOLE 500 MG PO TABS
500.0000 mg | ORAL_TABLET | Freq: Two times a day (BID) | ORAL | Status: DC
  Administered 2022-08-31 – 2022-09-01 (×3): 500 mg via ORAL
  Filled 2022-08-30 (×3): qty 1

## 2022-08-30 MED ORDER — PANTOPRAZOLE SODIUM 40 MG PO TBEC
40.0000 mg | DELAYED_RELEASE_TABLET | Freq: Two times a day (BID) | ORAL | Status: DC
  Administered 2022-08-31 – 2022-09-01 (×4): 40 mg via ORAL
  Filled 2022-08-30 (×4): qty 1

## 2022-08-30 MED ORDER — CEPHALEXIN 500 MG PO CAPS
500.0000 mg | ORAL_CAPSULE | Freq: Two times a day (BID) | ORAL | Status: DC
  Administered 2022-08-31 – 2022-09-01 (×3): 500 mg via ORAL
  Filled 2022-08-30 (×3): qty 1

## 2022-08-30 MED ORDER — METRONIDAZOLE 500 MG PO TABS: 500.0000 mg | ORAL_TABLET | Freq: Two times a day (BID) | ORAL | 0 refills | Status: AC

## 2022-08-30 MED ORDER — PINDOLOL 5 MG PO TABS
2.5000 mg | ORAL_TABLET | Freq: Two times a day (BID) | ORAL | Status: DC
  Administered 2022-08-30 – 2022-09-01 (×4): 2.5 mg via ORAL
  Filled 2022-08-30 (×5): qty 1

## 2022-08-30 MED ORDER — IOHEXOL 300 MG/ML  SOLN: 100.0000 mL | Freq: Once | INTRAMUSCULAR | Status: DC | PRN

## 2022-08-30 MED ORDER — APIXABAN 2.5 MG PO TABS
2.5000 mg | ORAL_TABLET | Freq: Two times a day (BID) | ORAL | Status: DC
  Administered 2022-08-30 – 2022-09-01 (×4): 2.5 mg via ORAL
  Filled 2022-08-30 (×4): qty 1

## 2022-08-30 MED ORDER — POTASSIUM CHLORIDE CRYS ER 20 MEQ PO TBCR
40.0000 meq | EXTENDED_RELEASE_TABLET | Freq: Every day | ORAL | Status: DC
  Administered 2022-08-31: 40 meq via ORAL
  Filled 2022-08-30: qty 2

## 2022-08-30 MED ORDER — ONDANSETRON HCL 4 MG/2ML IJ SOLN
4.0000 mg | Freq: Four times a day (QID) | INTRAMUSCULAR | Status: DC | PRN
  Administered 2022-08-31: 4 mg via INTRAVENOUS
  Filled 2022-08-30: qty 2

## 2022-08-30 MED ORDER — ROPINIROLE HCL 1 MG PO TABS
0.5000 mg | ORAL_TABLET | Freq: Every day | ORAL | Status: DC
  Administered 2022-08-30 – 2022-08-31 (×2): 0.5 mg via ORAL
  Filled 2022-08-30 (×2): qty 1

## 2022-08-30 MED ORDER — INSULIN ASPART 100 UNIT/ML IJ SOLN
0.0000 [IU] | Freq: Every day | INTRAMUSCULAR | Status: DC
  Filled 2022-08-30: qty 0.05

## 2022-08-30 MED ORDER — PANTOPRAZOLE SODIUM 40 MG PO TBEC: 40.0000 mg | DELAYED_RELEASE_TABLET | Freq: Two times a day (BID) | ORAL | Status: DC

## 2022-08-30 MED ORDER — TRAMADOL HCL 50 MG PO TABS
50.0000 mg | ORAL_TABLET | Freq: Two times a day (BID) | ORAL | Status: DC | PRN
  Administered 2022-08-31: 50 mg via ORAL
  Filled 2022-08-30: qty 1

## 2022-08-30 MED ORDER — METRONIDAZOLE 500 MG PO TABS: 500.0000 mg | ORAL_TABLET | Freq: Two times a day (BID) | ORAL | 0 refills | Status: DC

## 2022-08-30 MED ORDER — IOHEXOL 350 MG/ML SOLN
75.0000 mL | Freq: Once | INTRAVENOUS | Status: AC | PRN
  Administered 2022-08-30: 75 mL via INTRAVENOUS

## 2022-08-30 MED ORDER — FUROSEMIDE 10 MG/ML IJ SOLN
40.0000 mg | Freq: Once | INTRAMUSCULAR | Status: AC
  Administered 2022-08-30: 40 mg via INTRAVENOUS
  Filled 2022-08-30: qty 4

## 2022-08-30 MED ORDER — ACETAMINOPHEN 650 MG RE SUPP: 650.0000 mg | Freq: Four times a day (QID) | RECTAL | Status: DC | PRN

## 2022-08-30 MED ORDER — METRONIDAZOLE 500 MG PO TABS
500.0000 mg | ORAL_TABLET | Freq: Once | ORAL | Status: AC
  Administered 2022-08-30: 500 mg via ORAL
  Filled 2022-08-30: qty 1

## 2022-08-30 MED ORDER — FLORANEX PO PACK
1.0000 g | PACK | Freq: Every day | ORAL | Status: DC
  Administered 2022-08-31: 1 g via ORAL
  Filled 2022-08-30 (×3): qty 1

## 2022-08-30 MED ORDER — ROSUVASTATIN CALCIUM 10 MG PO TABS
10.0000 mg | ORAL_TABLET | Freq: Every day | ORAL | Status: DC
  Administered 2022-08-30 – 2022-08-31 (×2): 10 mg via ORAL
  Filled 2022-08-30 (×2): qty 1

## 2022-08-30 MED ORDER — ONDANSETRON HCL 4 MG PO TABS: 4.0000 mg | ORAL_TABLET | Freq: Four times a day (QID) | ORAL | Status: DC | PRN

## 2022-08-30 MED ORDER — CIPROFLOXACIN HCL 500 MG PO TABS
500.0000 mg | ORAL_TABLET | Freq: Once | ORAL | Status: AC
  Administered 2022-08-30: 500 mg via ORAL
  Filled 2022-08-30: qty 1

## 2022-08-30 MED ORDER — FAMOTIDINE 20 MG PO TABS
40.0000 mg | ORAL_TABLET | Freq: Every day | ORAL | Status: DC
  Administered 2022-08-30: 40 mg via ORAL
  Filled 2022-08-30: qty 2

## 2022-08-30 MED ORDER — LINACLOTIDE 145 MCG PO CAPS: 145.0000 ug | ORAL_CAPSULE | Freq: Every day | ORAL | Status: DC | PRN

## 2022-08-30 MED ORDER — CEFDINIR 300 MG PO CAPS: 300.0000 mg | ORAL_CAPSULE | Freq: Two times a day (BID) | ORAL | 0 refills | Status: AC

## 2022-08-30 MED ORDER — SERTRALINE HCL 25 MG PO TABS
25.0000 mg | ORAL_TABLET | Freq: Every day | ORAL | Status: DC
  Administered 2022-08-30 – 2022-08-31 (×2): 25 mg via ORAL
  Filled 2022-08-30 (×2): qty 1

## 2022-08-30 MED ORDER — CULTURELLE DIGESTIVE HEALTH PO CAPS: ORAL_CAPSULE | Freq: Every evening | ORAL | Status: DC

## 2022-08-30 MED ORDER — ACETAMINOPHEN 325 MG PO TABS
650.0000 mg | ORAL_TABLET | Freq: Four times a day (QID) | ORAL | Status: DC | PRN
  Administered 2022-09-01: 650 mg via ORAL
  Filled 2022-08-30: qty 2

## 2022-08-30 MED ORDER — FUROSEMIDE 10 MG/ML IJ SOLN
40.0000 mg | Freq: Two times a day (BID) | INTRAMUSCULAR | Status: DC
  Administered 2022-08-31 – 2022-09-01 (×3): 40 mg via INTRAVENOUS
  Filled 2022-08-30 (×3): qty 4

## 2022-08-30 MED ORDER — INSULIN ASPART 100 UNIT/ML IJ SOLN
0.0000 [IU] | Freq: Three times a day (TID) | INTRAMUSCULAR | Status: DC
  Administered 2022-08-31: 2 [IU] via SUBCUTANEOUS
  Administered 2022-08-31: 3 [IU] via SUBCUTANEOUS
  Administered 2022-09-01: 1 [IU] via SUBCUTANEOUS
  Administered 2022-09-01: 2 [IU] via SUBCUTANEOUS
  Filled 2022-08-30: qty 0.09

## 2022-08-30 NOTE — ED Triage Notes (Signed)
Per EMS- patient was discharged from the hospital a week ago with diverticulitis. Patient was prescribed Amoxicillin, but only took one dose because she said it caused diarrhea.  Patient c/o SOB, cough, and pedal edema. Rhonchi present lower lobes.

## 2022-08-30 NOTE — Assessment & Plan Note (Signed)
Patient states Augmentin gave her severe diarrhea.  We will switch her over to Keflex and Flagyl.

## 2022-08-30 NOTE — Assessment & Plan Note (Signed)
Stable.  Continue Crestor 10 mg nightly.

## 2022-08-30 NOTE — Assessment & Plan Note (Signed)
Hold metformin during hospitalization.  Add sliding scale for now.

## 2022-08-30 NOTE — ED Provider Triage Note (Signed)
Emergency Medicine Provider Triage Evaluation Note  Amberli Malcolm , a 88 y.o. female  was evaluated in triage.  Pt complains of gradually worsening shortness of breath x1 month worse over the past 2 days.  Patient describes shortness of breath as a difficulty catching her breath with exertion improved with rest.  Associated with pain bilateral pedal edema.  She reports she was admitted to the hospital for diverticulitis over the weekend she reports she has been taking Augmentin but had some diarrhea yesterday so she stopped taking the medication.  Review of Systems  Positive: Shortness of breath, chest pain with exertion Negative: Cough, fever/chills, vomiting, history of blood clot or any additional concerns  Physical Exam  There were no vitals taken for this visit. Gen:   Awake, no distress   Resp:  Normal effort, mild bilateral rhonchi MSK:   Moves extremities without difficulty, trace bilateral pedal edema symmetric.  No tenderness or swelling of the calves. Other:  Heart regular rate and rhythm  Medical Decision Making  Medically screening exam initiated at 1:20 PM.  Appropriate orders placed.  Dezzie Starke was informed that the remainder of the evaluation will be completed by another provider, this initial triage assessment does not replace that evaluation, and the importance of remaining in the ED until their evaluation is complete.   Note: Portions of this report may have been transcribed using voice recognition software. Every effort was made to ensure accuracy; however, inadvertent computerized transcription errors may still be present.    Deliah Boston, PA-C 08/30/22 1322

## 2022-08-30 NOTE — Assessment & Plan Note (Signed)
Continue Eliquis 2.5 g twice daily.

## 2022-08-30 NOTE — Subjective & Objective (Signed)
Chief complaint: Shortness of breath History of present illness: 88 year old female history of chronic diastolic heart failure, hyperlipidemia, paroxysmal atrial fibrillation on Eliquis, type 2 diabetes, who I originally admitted on 08/24/2022 for acute sigmoid diverticulitis.  During hospitalization, her Lasix was held as her p.o. intake was not adequate and she was placed on IV fluids.  She was discharged back to independent living at [facility] on 08/28/2022 with instructions not to restart her Lasix until 09/01/2022.  Patient has been without Lasix for the last 7 days.  She started feeling short of breath today.  Worse with walking.  She has also had diarrhea since starting her Augmentin 2 days ago.  Patient is also noticed increasing lower extremity edema for the last several days. Presented to the ER today for evaluation.  On arrival temp 99.8 heart rate 71 blood pressure 169/94.  Labs BNP elevated at 11/28/2001  COVID-negative  Sodium 134, potassium 3.9, BUN of 9, creatinine 0.72  White count 8.2, hemoglobin 9.9, platelets of 365  CTPA negative for PE.  Small right pleural effusion.  Multiple groundglass opacities reflecting pulmonary edema.  [facility] contacted for admission.

## 2022-08-30 NOTE — Assessment & Plan Note (Signed)
Stable.  Continue Protonix 40 mg twice daily and 40 mg of Pepcid at bedtime.

## 2022-08-30 NOTE — ED Provider Notes (Signed)
[facility] DEPT Provider Note   CSN: [id number] Arrival date & time: 08/30/22  1311     History  Chief Complaint  Patient presents with   Shortness of Breath   Cough    Heather Olsen is a 88 y.o. female who presents emergency department with chief complaint of shortness of breath.  Patient complains that she had onset of shortness of breath which has been progressively worsening during her recent hospitalization.  She was discharged 2 days ago after admission for diverticulitis.  He states that she was beginning to swell during her hospitalization however it was not addressed and when she was discharged she was told not to resume taking her furosemide which she takes 20 mg p.o. twice daily until this coming Thursday.  She states that in that time she has had progressively worsening peripheral edema and now extreme shortness of breath with even very minimal exertion.  It is worse when lying down better when sitting up.  She denies chest pain. Secondly patient was taking Augmentin however she began having severe diarrhea after the second dose.  She has not taken any of her antibiotics today.  She reports that her abdominal pain is improving but she would like an alternative treatment.  She denies fevers or chills.   Shortness of Breath Associated symptoms: cough   Cough Associated symptoms: shortness of breath        Home Medications Prior to Admission medications   Medication Sig Start Date End Date Taking? Authorizing Provider  acetaminophen (TYLENOL) 500 MG tablet Take 500-1,000 mg by mouth every 6 (six) hours as needed for moderate pain.    [provider]  amoxicillin-clavulanate (AUGMENTIN) 875-125 MG tablet Take 1 tablet by mouth 2 (two) times daily for 7 days. 08/28/22 09/04/22  Mercy Riding, MD  celecoxib (CELEBREX) 200 MG capsule TAKE 1 CAPSULE BY MOUTH EVERY DAY Patient not taking: Reported on 08/09/2022 12/16/16   Estill Dooms, MD   Cholecalciferol (VITAMIN D3) 2000 units capsule Take 2,000 Units by mouth daily.     [provider]  denosumab (PROLIA) 60 MG/ML SOSY injection Inject 60 mg into the skin every 6 (six) months. 07/25/19   [provider]  ELIQUIS 2.5 MG TABS tablet Take 2.5 mg by mouth 2 (two) times daily. 02/03/20   [provider]  famotidine (PEPCID) 40 MG tablet Take 40 mg by mouth at bedtime. 08/06/22   [provider]  furosemide (LASIX) 20 MG tablet Take 1 tablet (20 mg total) by mouth daily. 09/01/22   Mercy Riding, MD  ipratropium (ATROVENT) 0.06 % nasal spray Use 2 sprays in each nostril up to three times daily before meals for sinus drainage. Patient taking differently: Place 2 sprays into the nose daily. 09/30/16   Estill Dooms, MD  Lactobacillus-Inulin (CULTURELLE DIGESTIVE HEALTH PO) Take 1 capsule by mouth every evening. Take one daily for probiotic    [provider]  LINZESS 145 MCG CAPS capsule Take 145 mcg by mouth daily as needed (constipation). 07/29/22   [provider]  metFORMIN (GLUCOPHAGE-XR) 500 MG 24 hr tablet Take 1 tablet by mouth every evening. 02/01/22   [provider]  mirabegron ER (MYRBETRIQ) 50 MG TB24 tablet Take 1 tablet by mouth daily. 08/11/21   [provider]  MULTIPLE VITAMINS-MINERALS PO Take 1 tablet by mouth daily.     [provider]  neomycin-polymyxin b-dexamethasone (MAXITROL) 3.5-10000-0.1 OINT Place into the left eye 2 (two)  times daily. 08/10/22   [provider]  nitrofurantoin, macrocrystal-monohydrate, (MACROBID) 100 MG capsule Take 100 mg by mouth daily. 08/17/22   [provider]  nitroGLYCERIN (NITROSTAT) 0.4 MG SL tablet Place 1 tablet (0.4 mg total) under the tongue every 5 (five) minutes as needed for chest pain. Place one tablet under the tongue every 5 minutes as needed for chest pain. No more than 3 08/09/22   Minus Breeding, MD  ondansetron (ZOFRAN) 4 MG tablet  Take 4 mg by mouth every 8 (eight) hours as needed for nausea or vomiting.    [provider]  ondansetron (ZOFRAN-ODT) 4 MG disintegrating tablet '4mg'$  ODT q4 hours prn nausea/vomit Patient not taking: Reported on 08/25/2022 07/11/22   Deno Etienne, DO  pantoprazole (PROTONIX) 40 MG tablet Take 40 mg by mouth 2 (two) times daily. 12/18/19   [provider]  pindolol (VISKEN) 5 MG tablet Take 1/2 (one-half) tablet by mouth twice daily Patient taking differently: Take 2.5 mg by mouth 2 (two) times daily. 07/06/22   Minus Breeding, MD  Probiotic Product (PROBIOTIC DAILY PO) Take 1 capsule by mouth every evening.    [provider]  rOPINIRole (REQUIP) 0.5 MG tablet Take 0.5 mg by mouth at bedtime. 07/13/22   [provider]  rosuvastatin (CRESTOR) 10 MG tablet TAKE 1 TABLET BY MOUTH AT BEDTIME 11/23/20   Waynetta Sandy, MD  sertraline (ZOLOFT) 25 MG tablet Take 25 mg by mouth at bedtime. 08/10/22   [provider]  traMADol (ULTRAM) 50 MG tablet TAKE 1 TABLET BY MOUTH EVERY 4 HOURS Patient taking differently: Take 50 mg by mouth every 12 (twelve) hours as needed for moderate pain or severe pain (pain). 11/14/16   Reed, Tiffany L, DO      Allergies    Topiramate, Ace inhibitors, Codeine, Ibandronic acid, Meloxicam, Prevacid [lansoprazole], Sulfa antibiotics, Verapamil, and Zolpidem    Review of Systems   Review of Systems  Respiratory:  Positive for cough and shortness of breath.     Physical Exam Updated Vital Signs BP (!) 169/80 (BP Location: Right Arm)   Pulse 71   Temp 98.8 F (37.1 C) (Oral)   Resp 20   Ht '5\' 4"'$  (1.626 m)   Wt 50.3 kg   SpO2 97%   BMI 19.05 kg/m  Physical Exam Vitals and nursing note reviewed.  Constitutional:      General: She is not in acute distress.    Appearance: She is well-developed. She is not diaphoretic.  HENT:     Head: Normocephalic and atraumatic.     Right Ear: External ear normal.     Left Ear:  External ear normal.     Nose: Nose normal.     Mouth/Throat:     Mouth: Mucous membranes are moist.  Eyes:     General: No scleral icterus.    Conjunctiva/sclera: Conjunctivae normal.  Cardiovascular:     Rate and Rhythm: Normal rate and regular rhythm.     Heart sounds: Normal heart sounds. No murmur heard.    No friction rub. No gallop.  Pulmonary:     Effort: Tachypnea present. No respiratory distress.     Breath sounds: Decreased breath sounds present.     Comments: Breathing is labored Abdominal:     General: Bowel sounds are normal. There is no distension.     Palpations: Abdomen is soft. There is no mass.     Tenderness: There is no abdominal tenderness. There is no  guarding.  Musculoskeletal:     Cervical back: Normal range of motion.     Right lower leg: Edema present.     Left lower leg: Edema present.  Skin:    General: Skin is warm and dry.  Neurological:     Mental Status: She is alert and oriented to person, place, and time.  Psychiatric:        Behavior: Behavior normal.     ED Results / Procedures / Treatments   Labs (all labs ordered are listed, but only abnormal results are displayed) Labs Reviewed  BASIC METABOLIC PANEL - Abnormal; Notable for the following components:      Result Value   Sodium 134 (*)    Glucose, Bld 166 (*)    Calcium 8.8 (*)    All other components within normal limits  CBC - Abnormal; Notable for the following components:   RBC 2.93 (*)    Hemoglobin 9.9 (*)    HCT 29.6 (*)    MCV 101.0 (*)    All other components within normal limits  BRAIN NATRIURETIC PEPTIDE - Abnormal; Notable for the following components:   B Natriuretic Peptide 1,103.7 (*)    All other components within normal limits  TROPONIN I (HIGH SENSITIVITY) - Abnormal; Notable for the following components:   Troponin I (High Sensitivity) 39 (*)    All other components within normal limits  SARS CORONAVIRUS 2 BY RT PCR  TROPONIN I (HIGH SENSITIVITY)     EKG None  Radiology DG Chest 2 View  Result Date: 08/30/2022 CLINICAL DATA:  Provided history: Shortness of breath. EXAM: CHEST - 2 VIEW COMPARISON:  Chest radiographs 08/24/2022 and earlier. FINDINGS: Borderline cardiomegaly. Aortic atherosclerosis. Small right pleural effusion. Mild ill-defined opacity within the right lung base. 11 mm round opacity projecting in the region of the right lung apex on the AP radiograph. No appreciable airspace consolidation on the left. No evidence of pneumothorax. Dextrocurvature and spondylosis of the thoracic spine. IMPRESSION: Ill-defined opacity within the right lung base, which may reflect atelectasis or pneumonia. Small right pleural effusion. These findings are new from the prior examination of 08/24/2022. 11 mm round opacity projecting in the region of the right lung apex, concerning for a pulmonary nodule. A chest CT is recommended for further evaluation. Borderline cardiomegaly. Aortic Atherosclerosis (ICD10-I70.0). Electronically Signed   By: Kellie Simmering D.O.   On: 08/30/2022 14:22    Procedures Procedures    Medications Ordered in ED Medications  furosemide (LASIX) injection 40 mg (has no administration in time range)  ciprofloxacin (CIPRO) tablet 500 mg (has no administration in time range)  metroNIDAZOLE (FLAGYL) tablet 500 mg (has no administration in time range)    ED Course/ Medical Decision Making/ A&P                           Medical Decision Making This patient presents to the ED for concern of sob, this involves an extensive number of treatment options, and is a complaint that carries with it a high risk of complications and morbidity.  The differential diagnosis includes The emergent differential diagnosis for shortness of breath includes, but is not limited to, Pulmonary edema, bronchoconstriction, Pneumonia, Pulmonary embolism, Pneumotherax/ Hemothorax, Dysrythmia, ACS.      Co morbidities that complicate the patient  evaluation       chf, diverticulitis   Additional history obtained:  Additional history obtained from family at bedside    Lab  Tests:  I Ordered, and personally interpreted labs.  The pertinent results include: Mildly elevated troponin likely demand ischemia, BMP with mildly elevated glucose, potassium within normal limits, CBC with stable macrocytic anemia, BNP markedly elevated, negative respiratory panel   Imaging Studies ordered:  I ordered imaging studies including chest x-ray and CT angiogram-I independently visualized and interpreted imaging which showed pleural effusion and and pulmonary edema  I agree with the radiologist interpretation   Cardiac Monitoring:       The patient was maintained on a cardiac monitor.  I personally viewed and interpreted the cardiac monitored which showed an underlying rhythm of: Sinus rhythm   Medicines ordered and prescription drug management:  I ordered medication including Lasix for hypervolemia Reevaluation of the patient after these medicines showed that the patient improved I have reviewed the patients home medicines and have made adjustments as needed      Critical Interventions:       Lasix   Consultations Obtained:  I requested consultation with the Dr. Bridgett Larsson,  and discussed lab and imaging findings as well as pertinent plan - they recommend: Patient   Problem List / ED Course:       (I50.9) Acute on chronic congestive heart failure, unspecified heart failure type ([place])  (primary encounter diagnosis)     Reevaluation:  After the interventions noted above, I reevaluated the patient and found that they have :improved   Social Determinants of Health:       good social support   Dispostion:  After consideration of the diagnostic results and the patients response to treatment, I feel that the patent would benefit from admission.    Amount and/or Complexity of Data Reviewed Radiology:  ordered.  Risk Prescription drug management. Decision regarding hospitalization.           Final Clinical Impression(s) / ED Diagnoses Final diagnoses:  None    Rx / DC Orders ED Discharge Orders     None         Margarita Mail, PA-C 08/30/22 [id number]    Leanord Asal K, DO 08/30/22 2350

## 2022-08-30 NOTE — Assessment & Plan Note (Signed)
Admit to observation telemetry bed.  Continue with IV Lasix 40 mg twice daily.  Monitor serum creatinine.  Continue pindolol 2.5 mg twice daily.

## 2022-08-30 NOTE — Assessment & Plan Note (Signed)
Continue pindolol 2.5 mg twice daily.  She will be on Lasix 40 mg twice daily.

## 2022-08-30 NOTE — H&P (Signed)
History and Physical    Heather Olsen YSA:[id number] DOB: 1931-11-19 DOA: 08/30/2022  DOS: the patient was seen and examined on 08/30/2022  PCP: Deland Pretty, MD   Patient coming from: Home [facility](independent living)  I have personally briefly reviewed patient's old medical records in [place]  Chief complaint: Shortness of breath History of present illness: 88 year old female history of chronic diastolic heart failure, hyperlipidemia, paroxysmal atrial fibrillation on Eliquis, type 2 diabetes, who I originally admitted on 08/24/2022 for acute sigmoid diverticulitis.  During hospitalization, her Lasix was held as her p.o. intake was not adequate and she was placed on IV fluids.  She was discharged back to independent living at [facility] on 08/28/2022 with instructions not to restart her Lasix until 09/01/2022.  Patient has been without Lasix for the last 7 days.  She started feeling short of breath today.  Worse with walking.  She has also had diarrhea since starting her Augmentin 2 days ago.  Patient is also noticed increasing lower extremity edema for the last several days. Presented to the ER today for evaluation.  On arrival temp 99.8 heart rate 71 blood pressure 169/94.  Labs BNP elevated at 11/28/2001  COVID-negative  Sodium 134, potassium 3.9, BUN of 9, creatinine 0.72  White count 8.2, hemoglobin 9.9, platelets of 365  CTPA negative for PE.  Small right pleural effusion.  Multiple groundglass opacities reflecting pulmonary edema.  [facility] hospitalist contacted for admission.   ED Course: CTPA negative for PE. Shows pulmonary edema.  Review of Systems:  Review of Systems  Constitutional: Negative.   HENT: Negative.    Respiratory:  Positive for shortness of breath and wheezing.   Cardiovascular:  Positive for orthopnea, leg swelling and PND.  Gastrointestinal:  Positive for abdominal pain.  Genitourinary: Negative.   Musculoskeletal: Negative.   Skin:  Negative.   Neurological: Negative.   Endo/Heme/Allergies: Negative.   Psychiatric/Behavioral: Negative.    All other systems reviewed and are negative.   Past Medical History:  Diagnosis Date   Aortic stenosis 04/12/2016   none noted on echo done 08/13/20   Coronary artery disease    Depression    Diverticulosis    Hearing loss 04/12/2016   Hiatal hernia 04/12/2016   HLD (hyperlipidemia) 04/12/2016   Hyperglycemia 09/14/2016   Hypertension    Idiopathic scoliosis 04/12/2016   Major depression 04/12/2016   Mitral regurgitation    Osteoporosis, senile 04/12/2016   Pernicious anemia    Raynaud's disease 04/12/2016   Scoliosis    Spinal stenosis of lumbar region 04/12/2016    Past Surgical History:  Procedure Laterality Date   APPENDECTOMY     BREAST SURGERY     broken wrist Right 2007   CARPAL TUNNEL RELEASE Left 04/04/2017   Procedure: LEFT CARPAL TUNNEL RELEASE;  Surgeon: Daryll Brod, MD;  Location: [facility];  Service: Orthopedics;  Laterality: Left;  REG/FAB   CARPAL TUNNEL RELEASE Right 12/01/2020   Procedure: CARPAL TUNNEL RELEASE;  Surgeon: Daryll Brod, MD;  Location: [facility];  Service: Orthopedics;  Laterality: Right;  IV REGIONAL FOREARM BLOCK   CATARACT EXTRACTION W/ INTRAOCULAR LENS  IMPLANT, BILATERAL Bilateral 2017   COLONOSCOPY  2016   FOOT SURGERY Right    PERIPHERAL VASCULAR INTERVENTION  11/11/2019   Procedure: PERIPHERAL VASCULAR INTERVENTION;  Surgeon: Waynetta Sandy, MD;  Location: [place];  Service: Cardiovascular;;   TONSILLECTOMY     VISCERAL ANGIOGRAPHY N/A 11/11/2019   Procedure:  VISCERAL ANGIOGRAPHY;  Surgeon: Waynetta Sandy, MD;  Location: [facility];  Service: Cardiovascular;  Laterality: N/A;     reports that she has never smoked. She has never been exposed to tobacco smoke. She has never used smokeless tobacco. She reports current alcohol use. She reports that she does not use  drugs.  Allergies  Allergen Reactions   Topiramate Rash   Ace Inhibitors Cough   Codeine Nausea Only   Ibandronic Acid Nausea Only   Meloxicam     Other reaction(s): GI upset, GI Upset (intolerance), Other (See Comments) unknown    Prevacid [Lansoprazole]     Chest pain   Sulfa Antibiotics Other (See Comments)    headaches   Verapamil Other (See Comments)    constipation   Zolpidem Other (See Comments)    hallucinations    Family History  Problem Relation Age of Onset   Heart disease Mother        Died 92, MR, no CAD   Heart disease Father        CHF, no CAD.[name]    HIV Daughter 50       from bone tissue transplant    Prior to Admission medications   Medication Sig Start Date End Date Taking? Authorizing Provider  cefdinir (OMNICEF) 300 MG capsule Take 1 capsule (300 mg total) by mouth 2 (two) times daily for 10 days. 08/30/22 09/09/22 Yes Harris, Abigail, PA-C  acetaminophen (TYLENOL) 500 MG tablet Take 500-1,000 mg by mouth every 6 (six) hours as needed for moderate pain.    [provider]  amoxicillin-clavulanate (AUGMENTIN) 875-125 MG tablet Take 1 tablet by mouth 2 (two) times daily for 7 days. 08/28/22 09/04/22  Mercy Riding, MD  celecoxib (CELEBREX) 200 MG capsule TAKE 1 CAPSULE BY MOUTH EVERY DAY Patient not taking: Reported on 08/09/2022 12/16/16   Estill Dooms, MD  Cholecalciferol (VITAMIN D3) 2000 units capsule Take 2,000 Units by mouth daily.     [provider]  denosumab (PROLIA) 60 MG/ML SOSY injection Inject 60 mg into the skin every 6 (six) months. 07/25/19   [provider]  ELIQUIS 2.5 MG TABS tablet Take 2.5 mg by mouth 2 (two) times daily. 02/03/20   [provider]  famotidine (PEPCID) 40 MG tablet Take 40 mg by mouth at bedtime. 08/06/22   [provider]  furosemide (LASIX) 20 MG tablet Take 1 tablet (20 mg total) by mouth daily. 09/01/22   Mercy Riding, MD  ipratropium (ATROVENT) 0.06 % nasal spray Use 2 sprays  in each nostril up to three times daily before meals for sinus drainage. Patient taking differently: Place 2 sprays into the nose daily. 09/30/16   Estill Dooms, MD  Lactobacillus-Inulin (CULTURELLE DIGESTIVE HEALTH PO) Take 1 capsule by mouth every evening. Take one daily for probiotic    [provider]  LINZESS 145 MCG CAPS capsule Take 145 mcg by mouth daily as needed (constipation). 07/29/22   [provider]  metFORMIN (GLUCOPHAGE-XR) 500 MG 24 hr tablet Take 1 tablet by mouth every evening. 02/01/22   [provider]  metroNIDAZOLE (FLAGYL) 500 MG tablet Take 1 tablet (500 mg total) by mouth 2 (two) times daily for 10 days. One po bid x 7 days 08/30/22 09/09/22  Margarita Mail, PA-C  mirabegron ER (MYRBETRIQ) 50 MG TB24 tablet Take 1 tablet by mouth daily. 08/11/21   [provider]  MULTIPLE VITAMINS-MINERALS PO Take 1 tablet by mouth daily.  [provider]  neomycin-polymyxin b-dexamethasone (MAXITROL) 3.5-10000-0.1 OINT Place into the left eye 2 (two) times daily. 08/10/22   [provider]  nitrofurantoin, macrocrystal-monohydrate, (MACROBID) 100 MG capsule Take 100 mg by mouth daily. 08/17/22   [provider]  nitroGLYCERIN (NITROSTAT) 0.4 MG SL tablet Place 1 tablet (0.4 mg total) under the tongue every 5 (five) minutes as needed for chest pain. Place one tablet under the tongue every 5 minutes as needed for chest pain. No more than 3 08/09/22   Minus Breeding, MD  ondansetron (ZOFRAN) 4 MG tablet Take 4 mg by mouth every 8 (eight) hours as needed for nausea or vomiting.    [provider]  ondansetron (ZOFRAN-ODT) 4 MG disintegrating tablet '4mg'$  ODT q4 hours prn nausea/vomit Patient not taking: Reported on 08/25/2022 07/11/22   Deno Etienne, DO  pantoprazole (PROTONIX) 40 MG tablet Take 40 mg by mouth 2 (two) times daily. 12/18/19   [provider]  pindolol (VISKEN) 5 MG tablet Take 1/2 (one-half) tablet by mouth  twice daily Patient taking differently: Take 2.5 mg by mouth 2 (two) times daily. 07/06/22   Minus Breeding, MD  Probiotic Product (PROBIOTIC DAILY PO) Take 1 capsule by mouth every evening.    [provider]  rOPINIRole (REQUIP) 0.5 MG tablet Take 0.5 mg by mouth at bedtime. 07/13/22   [provider]  rosuvastatin (CRESTOR) 10 MG tablet TAKE 1 TABLET BY MOUTH AT BEDTIME 11/23/20   Waynetta Sandy, MD  sertraline (ZOLOFT) 25 MG tablet Take 25 mg by mouth at bedtime. 08/10/22   [provider]  traMADol (ULTRAM) 50 MG tablet TAKE 1 TABLET BY MOUTH EVERY 4 HOURS Patient taking differently: Take 50 mg by mouth every 12 (twelve) hours as needed for moderate pain or severe pain (pain). 11/14/16   Gayland Curry, DO    Physical Exam: Vitals:   08/30/22 1328 08/30/22 1758 08/30/22 1900 08/30/22 2112  BP: (!) 169/91 (!) 169/80 (!) 160/98 (!) 149/94  Pulse:  71  64  Resp:  20 (!) 38 (!) 33  Temp:  98.8 F (37.1 C)  99.1 F (37.3 C)  TempSrc:  Oral  Oral  SpO2:  97%  99%  Weight:      Height:        Physical Exam Vitals and nursing note reviewed.  Constitutional:      General: She is not in acute distress.    Appearance: Normal appearance. She is not ill-appearing, toxic-appearing or diaphoretic.  HENT:     Head: Normocephalic and atraumatic.     Nose: Nose normal.  Eyes:     General: No scleral icterus. Cardiovascular:     Rate and Rhythm: Normal rate and regular rhythm.     Pulses: Normal pulses.     Heart sounds: Murmur heard.  Pulmonary:     Effort: Pulmonary effort is normal. No respiratory distress.     Breath sounds: Examination of the right-middle field reveals rales. Examination of the left-middle field reveals rales. Examination of the right-lower field reveals rales. Examination of the left-lower field reveals rales. Rales present.  Abdominal:     General: Abdomen is flat. Bowel sounds are normal. There is no distension.      Palpations: Abdomen is soft.     Tenderness: There is no abdominal tenderness. There is no guarding or rebound.  Musculoskeletal:     Right lower leg: 2+ Edema present.     Left lower leg: 2+ Edema present.  Right ankle: Swelling present.     Left ankle: Swelling present.  Skin:    General: Skin is warm and dry.     Capillary Refill: Capillary refill takes less than 2 seconds.  Neurological:     General: No focal deficit present.     Mental Status: She is alert and oriented to person, place, and time.      Labs on Admission: I have personally reviewed following labs and imaging studies  CBC: Recent Labs  Lab 08/24/22 2325 08/26/22 0634 08/27/22 0659 08/28/22 0647 08/30/22 1351  WBC 13.6* 16.7* 10.8* 7.1 8.2  NEUTROABS 10.7* 13.7*  --   --   --   HGB 10.8* 10.1* 9.6* 9.1* 9.9*  HCT 32.9* 30.9* 30.1* 28.5* 29.6*  MCV 102.2* 104.0* 104.5* 104.4* 101.0*  PLT 359 336 350 348 376   Basic Metabolic Panel: Recent Labs  Lab 08/24/22 2325 08/26/22 0634 08/27/22 0659 08/28/22 0647 08/30/22 1351  NA 133* 130* 133* 137 134*  K 3.5 3.5 3.1* 4.5 3.9  CL 98 98 100 108 103  CO2 '26 24 23 22 22  '$ GLUCOSE 172* 138* 117* 124* 166*  BUN '19 14 14 15 9  '$ CREATININE 0.85 0.77 0.80 0.74 0.72  CALCIUM 9.4 8.3* 8.4* 7.9* 8.8*  MG  --   --  1.8 1.7  --   PHOS  --   --  2.6 1.8*  --    GFR: Estimated Creatinine Clearance: 37.9 mL/min (by C-G formula based on SCr of 0.72 mg/dL). Liver Function Tests: Recent Labs  Lab 08/24/22 2325 08/26/22 0634 08/27/22 0659 08/28/22 0647  AST 21 19  --   --   ALT 14 12  --   --   ALKPHOS 80 65  --   --   BILITOT 0.8 0.6  --   --   PROT 6.5 5.8*  --   --   ALBUMIN 3.4* 2.8* 2.9* 2.7*   Recent Labs  Lab 08/24/22 2325  LIPASE 27   No results for input(s): "AMMONIA" in the last 168 hours. Coagulation Profile: No results for input(s): "INR", "PROTIME" in the last 168 hours. Cardiac Enzymes: Recent Labs  Lab 08/30/22 1351 08/30/22 1951   TROPONINIHS 39* 35*   BNP (last 3 results) No results for input(s): "PROBNP" in the last 8760 hours. HbA1C: No results for input(s): "HGBA1C" in the last 72 hours. CBG: Recent Labs  Lab 08/27/22 1625 08/27/22 2104 08/28/22 0718 08/28/22 1139 08/28/22 1600  GLUCAP 239* 205* 124* 147* 158*   Lipid Profile: No results for input(s): "CHOL", "HDL", "LDLCALC", "TRIG", "CHOLHDL", "LDLDIRECT" in the last 72 hours. Thyroid Function Tests: No results for input(s): "TSH", "T4TOTAL", "FREET4", "T3FREE", "THYROIDAB" in the last 72 hours. Anemia Panel: Recent Labs    08/28/22 0647  VITAMINB12 851  FOLATE 21.5  FERRITIN 86  TIBC 259  IRON 38  RETICCTPCT 1.5   Urine analysis:    Component Value Date/Time   COLORURINE YELLOW 08/24/2022 2325   APPEARANCEUR CLEAR 08/24/2022 2325   LABSPEC 1.012 08/24/2022 2325   PHURINE 7.0 08/24/2022 2325   GLUCOSEU NEGATIVE 08/24/2022 2325   HGBUR NEGATIVE 08/24/2022 2325   BILIRUBINUR NEGATIVE 08/24/2022 2325   KETONESUR NEGATIVE 08/24/2022 2325   PROTEINUR NEGATIVE 08/24/2022 2325   NITRITE NEGATIVE 08/24/2022 2325   LEUKOCYTESUR NEGATIVE 08/24/2022 2325    Radiological Exams on Admission: I have personally reviewed images CT Angio Chest PE W and/or Wo Contrast  Result Date: 08/30/2022 CLINICAL DATA:  Chest  pain and shortness of breath. EXAM: CT ANGIOGRAPHY CHEST WITH CONTRAST TECHNIQUE: Multidetector CT imaging of the chest was performed using the standard protocol during bolus administration of intravenous contrast. Multiplanar CT image reconstructions and MIPs were obtained to evaluate the vascular anatomy. RADIATION DOSE REDUCTION: This exam was performed according to the departmental dose-optimization program which includes automated exposure control, adjustment of the mA and/or kV according to patient size and/or use of iterative reconstruction technique. CONTRAST:  51m OMNIPAQUE IOHEXOL 350 MG/ML SOLN COMPARISON:  CT abdomen and pelvis  08/17/2022. FINDINGS: Cardiovascular: The heart is enlarged. Aorta is normal in size. There are atherosclerotic calcifications of the aorta. There is no pericardial effusion. There is adequate opacification of the pulmonary arteries to the segmental level. There is no evidence for pulmonary embolism. Mediastinum/Nodes: There is a hypodense right thyroid nodule measuring 8 mm. There are no enlarged mediastinal or hilar lymph nodes. Esophagus is within normal limits. Lungs/Pleura: There is a small right pleural effusion. There is some atelectasis in the bilateral lower lobes. There are patchy multifocal ground-glass opacities in the bilateral upper lobes with scarring in both lung apices. Trachea and central airways are patent. No pneumothorax. Upper Abdomen: No acute abnormality. Musculoskeletal: There is dextroconvex scoliosis of the thoracic spine. Review of the MIP images confirms the above findings. IMPRESSION: 1. No evidence for pulmonary embolism. 2. Small right pleural effusion. 3. Patchy multifocal ground-glass opacities in the bilateral upper lobes, nonspecific. Findings may be infectious/inflammatory or related to pulmonary edema. 4. Cardiomegaly. 5. 8 mm incidental right thyroid nodule. No follow-up imaging is recommended. Reference: J Am Coll Radiol. 2015 Feb;12(2): 143-50 Aortic Atherosclerosis (ICD10-I70.0). Electronically Signed   By: ARonney AstersM.D.   On: 08/30/2022 19:17   DG Chest 2 View  Result Date: 08/30/2022 CLINICAL DATA:  Provided history: Shortness of breath. EXAM: CHEST - 2 VIEW COMPARISON:  Chest radiographs 08/24/2022 and earlier. FINDINGS: Borderline cardiomegaly. Aortic atherosclerosis. Small right pleural effusion. Mild ill-defined opacity within the right lung base. 11 mm round opacity projecting in the region of the right lung apex on the AP radiograph. No appreciable airspace consolidation on the left. No evidence of pneumothorax. Dextrocurvature and spondylosis of the  thoracic spine. IMPRESSION: Ill-defined opacity within the right lung base, which may reflect atelectasis or pneumonia. Small right pleural effusion. These findings are new from the prior examination of 08/24/2022. 11 mm round opacity projecting in the region of the right lung apex, concerning for a pulmonary nodule. A chest CT is recommended for further evaluation. Borderline cardiomegaly. Aortic Atherosclerosis (ICD10-I70.0). Electronically Signed   By: KKellie SimmeringD.O.   On: 08/30/2022 14:22    EKG: My personal interpretation of EKG shows: NSR    Assessment/Plan Principal Problem:   Acute on chronic diastolic CHF (congestive heart failure) (HCC) Active Problems:   Acute diverticulitis   Essential hypertension   HLD (hyperlipidemia)   PAF (paroxysmal atrial fibrillation) (HCC)   Type 2 diabetes mellitus with unspecified complications (HCC)   Gastroesophageal reflux disease   Long term (current) use of anticoagulants - on Eliquis for PAF    Assessment and Plan: * Acute on chronic diastolic CHF (congestive heart failure) [place] Admit to observation telemetry bed.  Continue with IV Lasix 40 mg twice daily.  Monitor serum creatinine.  Continue pindolol 2.5 mg twice daily.  Acute diverticulitis Patient states Augmentin gave her severe diarrhea.  We will switch her over to Keflex and Flagyl.  Long term (current) use of anticoagulants - on Eliquis  for PAF Continue Eliquis 2.5 g twice daily.  Gastroesophageal reflux disease Stable.  Continue Protonix 40 mg twice daily and 40 mg of Pepcid at bedtime.  Type 2 diabetes mellitus with unspecified complications (HCC) Hold metformin during hospitalization.  Add sliding scale for now.  HLD (hyperlipidemia) Stable.  Continue Crestor 10 mg nightly.  Essential hypertension Continue pindolol 2.5 mg twice daily.  She will be on Lasix 40 mg twice daily.   DVT prophylaxis: Eliquis Code Status: Full Code Family Communication: no family at  bedside  Disposition Plan: return to [place] called: none  Admission status: Observation, Telemetry bed   Kristopher Oppenheim, DO [facility] 08/30/2022, 9:23 PM

## 2022-08-31 DIAGNOSIS — I110 Hypertensive heart disease with heart failure: Secondary | ICD-10-CM | POA: Diagnosis present

## 2022-08-31 DIAGNOSIS — K449 Diaphragmatic hernia without obstruction or gangrene: Secondary | ICD-10-CM | POA: Diagnosis present

## 2022-08-31 DIAGNOSIS — Z1152 Encounter for screening for COVID-19: Secondary | ICD-10-CM | POA: Diagnosis not present

## 2022-08-31 DIAGNOSIS — I272 Pulmonary hypertension, unspecified: Secondary | ICD-10-CM | POA: Diagnosis present

## 2022-08-31 DIAGNOSIS — I509 Heart failure, unspecified: Secondary | ICD-10-CM | POA: Diagnosis present

## 2022-08-31 DIAGNOSIS — I080 Rheumatic disorders of both mitral and aortic valves: Secondary | ICD-10-CM | POA: Diagnosis present

## 2022-08-31 DIAGNOSIS — Z8249 Family history of ischemic heart disease and other diseases of the circulatory system: Secondary | ICD-10-CM | POA: Diagnosis not present

## 2022-08-31 DIAGNOSIS — Z7901 Long term (current) use of anticoagulants: Secondary | ICD-10-CM | POA: Diagnosis not present

## 2022-08-31 DIAGNOSIS — I5033 Acute on chronic diastolic (congestive) heart failure: Secondary | ICD-10-CM | POA: Diagnosis present

## 2022-08-31 DIAGNOSIS — Z79899 Other long term (current) drug therapy: Secondary | ICD-10-CM | POA: Diagnosis not present

## 2022-08-31 DIAGNOSIS — E785 Hyperlipidemia, unspecified: Secondary | ICD-10-CM | POA: Diagnosis present

## 2022-08-31 DIAGNOSIS — E119 Type 2 diabetes mellitus without complications: Secondary | ICD-10-CM | POA: Diagnosis present

## 2022-08-31 DIAGNOSIS — M810 Age-related osteoporosis without current pathological fracture: Secondary | ICD-10-CM | POA: Diagnosis present

## 2022-08-31 DIAGNOSIS — E876 Hypokalemia: Secondary | ICD-10-CM | POA: Diagnosis not present

## 2022-08-31 DIAGNOSIS — I480 Paroxysmal atrial fibrillation: Secondary | ICD-10-CM | POA: Diagnosis present

## 2022-08-31 DIAGNOSIS — M4120 Other idiopathic scoliosis, site unspecified: Secondary | ICD-10-CM | POA: Diagnosis present

## 2022-08-31 DIAGNOSIS — Z888 Allergy status to other drugs, medicaments and biological substances status: Secondary | ICD-10-CM | POA: Diagnosis not present

## 2022-08-31 DIAGNOSIS — Z882 Allergy status to sulfonamides status: Secondary | ICD-10-CM | POA: Diagnosis not present

## 2022-08-31 DIAGNOSIS — K5732 Diverticulitis of large intestine without perforation or abscess without bleeding: Secondary | ICD-10-CM | POA: Diagnosis present

## 2022-08-31 DIAGNOSIS — Z7984 Long term (current) use of oral hypoglycemic drugs: Secondary | ICD-10-CM | POA: Diagnosis not present

## 2022-08-31 DIAGNOSIS — Z885 Allergy status to narcotic agent status: Secondary | ICD-10-CM | POA: Diagnosis not present

## 2022-08-31 DIAGNOSIS — E871 Hypo-osmolality and hyponatremia: Secondary | ICD-10-CM | POA: Diagnosis present

## 2022-08-31 DIAGNOSIS — I10 Essential (primary) hypertension: Secondary | ICD-10-CM | POA: Diagnosis not present

## 2022-08-31 DIAGNOSIS — I5022 Chronic systolic (congestive) heart failure: Secondary | ICD-10-CM | POA: Diagnosis not present

## 2022-08-31 DIAGNOSIS — I2510 Atherosclerotic heart disease of native coronary artery without angina pectoris: Secondary | ICD-10-CM | POA: Diagnosis present

## 2022-08-31 DIAGNOSIS — H9190 Unspecified hearing loss, unspecified ear: Secondary | ICD-10-CM | POA: Diagnosis present

## 2022-08-31 LAB — COMPREHENSIVE METABOLIC PANEL
ALT: 16 U/L (ref 0–44)
AST: 24 U/L (ref 15–41)
Albumin: 3.4 g/dL — ABNORMAL LOW (ref 3.5–5.0)
Alkaline Phosphatase: 83 U/L (ref 38–126)
Anion gap: 9 (ref 5–15)
BUN: 9 mg/dL (ref 8–23)
CO2: 26 mmol/L (ref 22–32)
Calcium: 8.5 mg/dL — ABNORMAL LOW (ref 8.9–10.3)
Chloride: 97 mmol/L — ABNORMAL LOW (ref 98–111)
Creatinine, Ser: 0.71 mg/dL (ref 0.44–1.00)
GFR, Estimated: >60 mL/min (ref >60)
Glucose, Bld: 155 mg/dL — ABNORMAL HIGH (ref 70–99)
Potassium: 3.2 mmol/L — ABNORMAL LOW (ref 3.5–5.1)
Sodium: 132 mmol/L — ABNORMAL LOW (ref 135–145)
Total Bilirubin: 0.3 mg/dL (ref 0.3–1.2)
Total Protein: 6.7 g/dL (ref 6.5–8.1)

## 2022-08-31 LAB — CBC WITH DIFFERENTIAL/PLATELET
Abs Immature Granulocytes: 0.04 10*3/uL (ref 0.00–0.07)
Basophils Absolute: 0 10*3/uL (ref 0.0–0.1)
Basophils Relative: 0 %
Eosinophils Absolute: 0.1 10*3/uL (ref 0.0–0.5)
Eosinophils Relative: 1 %
HCT: 33 % — ABNORMAL LOW (ref 36.0–46.0)
Hemoglobin: 10.9 g/dL — ABNORMAL LOW (ref 12.0–15.0)
Immature Granulocytes: 0 %
Lymphocytes Relative: 15 %
Lymphs Abs: 1.5 10*3/uL (ref 0.7–4.0)
MCH: 33.5 pg (ref 26.0–34.0)
MCHC: 33 g/dL (ref 30.0–36.0)
MCV: 101.5 fL — ABNORMAL HIGH (ref 80.0–100.0)
Monocytes Absolute: 1 10*3/uL (ref 0.1–1.0)
Monocytes Relative: 11 %
Neutro Abs: 7.1 10*3/uL (ref 1.7–7.7)
Neutrophils Relative %: 73 %
Platelets: 379 10*3/uL (ref 150–400)
RBC: 3.25 MIL/uL — ABNORMAL LOW (ref 3.87–5.11)
RDW: 13.2 % (ref 11.5–15.5)
WBC: 9.8 10*3/uL (ref 4.0–10.5)
nRBC: 0 % (ref 0.0–0.2)

## 2022-08-31 LAB — GLUCOSE, CAPILLARY: Glucose-Capillary: 150 mg/dL — ABNORMAL HIGH (ref 70–99)

## 2022-08-31 LAB — CBG MONITORING, ED
Glucose-Capillary: 165 mg/dL — ABNORMAL HIGH (ref 70–99)
Glucose-Capillary: 186 mg/dL — ABNORMAL HIGH (ref 70–99)
Glucose-Capillary: 235 mg/dL — ABNORMAL HIGH (ref 70–99)
Glucose-Capillary: 115 mg/dL — ABNORMAL HIGH (ref 70–99)

## 2022-08-31 LAB — MAGNESIUM: Magnesium: 1.6 mg/dL — ABNORMAL LOW (ref 1.7–2.4)

## 2022-08-31 MED ORDER — MAGNESIUM SULFATE 4 GM/100ML IV SOLN
4.0000 g | Freq: Once | INTRAVENOUS | Status: AC
  Administered 2022-08-31: 4 g via INTRAVENOUS
  Filled 2022-08-31: qty 100

## 2022-08-31 MED ORDER — FAMOTIDINE 20 MG PO TABS
20.0000 mg | ORAL_TABLET | Freq: Every day | ORAL | Status: DC
  Administered 2022-08-31: 20 mg via ORAL
  Filled 2022-08-31: qty 1

## 2022-08-31 MED ORDER — POTASSIUM CHLORIDE CRYS ER 20 MEQ PO TBCR
40.0000 meq | EXTENDED_RELEASE_TABLET | Freq: Every day | ORAL | Status: DC
  Administered 2022-09-01: 40 meq via ORAL
  Filled 2022-08-31: qty 2

## 2022-08-31 MED ORDER — HYDRALAZINE HCL 20 MG/ML IJ SOLN: 10.0000 mg | INTRAMUSCULAR | Status: DC | PRN

## 2022-08-31 MED ORDER — POTASSIUM CHLORIDE CRYS ER 20 MEQ PO TBCR
40.0000 meq | EXTENDED_RELEASE_TABLET | ORAL | Status: AC
  Administered 2022-08-31 (×2): 40 meq via ORAL
  Filled 2022-08-31 (×2): qty 2

## 2022-08-31 NOTE — Progress Notes (Signed)
  Progress Note   Patient: Heather Olsen WYO:[id number] DOB: [birth year] DOA: 08/30/2022     0 DOS: the patient was seen and examined on 08/31/2022   Brief hospital course: 88 year old female history of chronic diastolic heart failure, hyperlipidemia, paroxysmal atrial fibrillation on Eliquis, type 2 diabetes, who I originally admitted on 08/24/2022 for acute sigmoid diverticulitis.   During hospitalization, her Lasix was held as her p.o. intake was not adequate and she was placed on IV fluids.  She was discharged back to independent living at [facility] on 08/28/2022 with instructions not to restart her Lasix until 09/01/2022.  Patient has been without Lasix for the last 7 days.  She started feeling short of breath today.  Worse with walking.  She has also had diarrhea since starting her Augmentin 2 days ago.  Patient is also noticed increasing lower extremity edema for the last several days. Presented to the ER today for evaluation.  Assessment and Plan: * Acute on chronic diastolic CHF (congestive heart failure) (HCC) Continue with IV Lasix 40 mg twice daily.  Monitor serum creatinine. Continue pindolol 2.5 mg twice daily. Pt reports feeling better since receiving lasix Still hypoxemic and tachypneic this afternoon Will check repeat bmet in AM   Acute diverticulitis Patient states Augmentin gave her severe diarrhea.  We will switch her over to Keflex and Flagyl.   Long term (current) use of anticoagulants - on Eliquis for PAF Continue Eliquis 2.5 g twice daily.   Gastroesophageal reflux disease Stable.  Continue Protonix 40 mg twice daily and 40 mg of Pepcid at bedtime.   Type 2 diabetes mellitus with unspecified complications (HCC) Hold metformin during hospitalization.  Add sliding scale for now.   HLD (hyperlipidemia) Stable.  Continue Crestor 10 mg nightly.   Essential hypertension Continue pindolol 2.5 mg twice daily.  She will be on Lasix 40 mg twice daily.  Hypokalemia -will  replace -Recheck bmet in AM  Hypomagnesemia -will replace      Subjective: Reports feeling better today. Eager to go home soon  Physical Exam: Vitals:   08/31/22 1242 08/31/22 1400 08/31/22 1505 08/31/22 1630  BP:  134/82  109/63  Pulse: (!) 59 60  (!) 58  Resp: 20 (!) 26  (!) 21  Temp:   97.8 F (36.6 C)   TempSrc:      SpO2: 92% 92%  93%  Weight:      Height:       General exam: Awake, laying in bed, in nad Respiratory system: Normal respiratory effort, no wheezing Cardiovascular system: regular rate, s1, s2 Gastrointestinal system: Soft, nondistended, positive BS Central nervous system: CN2-12 grossly intact, strength intact Extremities: Perfused, no clubbing Skin: Normal skin turgor, no notable skin lesions seen Psychiatry: Mood normal // no visual hallucinations   Data Reviewed:  Labs reviewed: Na132 K3.2, Cr 0.71  Family Communication: Pt at bedside  Disposition: Status is: Observation The patient will require care spanning > 2 midnights and should be moved to inpatient because: Severity of illness  Planned Discharge Destination: Home     Author: Marylu Lund, MD 08/31/2022 6:20 PM  For on call review [email or web address].

## 2022-08-31 NOTE — ED Notes (Signed)
Pt given meal tray.

## 2022-08-31 NOTE — Hospital Course (Signed)
88 year old female history of chronic diastolic heart failure, hyperlipidemia, paroxysmal atrial fibrillation on Eliquis, type 2 diabetes, who I originally admitted on 08/24/2022 for acute sigmoid diverticulitis.   During hospitalization, her Lasix was held as her p.o. intake was not adequate and she was placed on IV fluids.  She was discharged back to independent living at [facility] on 08/28/2022 with instructions not to restart her Lasix until 09/01/2022.  Patient has been without Lasix for the last 7 days.  She started feeling short of breath today.  Worse with walking.  She has also had diarrhea since starting her Augmentin 2 days ago.  Patient is also noticed increasing lower extremity edema for the last several days. Presented to the ER today for evaluation.

## 2022-09-01 DIAGNOSIS — I5022 Chronic systolic (congestive) heart failure: Secondary | ICD-10-CM

## 2022-09-01 DIAGNOSIS — I509 Heart failure, unspecified: Secondary | ICD-10-CM | POA: Diagnosis not present

## 2022-09-01 DIAGNOSIS — I10 Essential (primary) hypertension: Secondary | ICD-10-CM | POA: Diagnosis not present

## 2022-09-01 DIAGNOSIS — I5033 Acute on chronic diastolic (congestive) heart failure: Secondary | ICD-10-CM | POA: Diagnosis not present

## 2022-09-01 LAB — COMPREHENSIVE METABOLIC PANEL
ALT: 18 U/L (ref 0–44)
AST: 29 U/L (ref 15–41)
Albumin: 3.6 g/dL (ref 3.5–5.0)
Alkaline Phosphatase: 85 U/L (ref 38–126)
Anion gap: 11 (ref 5–15)
BUN: 15 mg/dL (ref 8–23)
CO2: 24 mmol/L (ref 22–32)
Calcium: 8.6 mg/dL — ABNORMAL LOW (ref 8.9–10.3)
Chloride: 95 mmol/L — ABNORMAL LOW (ref 98–111)
Creatinine, Ser: 1.09 mg/dL — ABNORMAL HIGH (ref 0.44–1.00)
GFR, Estimated: 49 mL/min — ABNORMAL LOW (ref >60)
Glucose, Bld: 161 mg/dL — ABNORMAL HIGH (ref 70–99)
Potassium: 4.1 mmol/L (ref 3.5–5.1)
Sodium: 130 mmol/L — ABNORMAL LOW (ref 135–145)
Total Bilirubin: 0.6 mg/dL (ref 0.3–1.2)
Total Protein: 7.2 g/dL (ref 6.5–8.1)

## 2022-09-01 LAB — CBC
HCT: 37.3 % (ref 36.0–46.0)
Hemoglobin: 12.1 g/dL (ref 12.0–15.0)
MCH: 33.5 pg (ref 26.0–34.0)
MCHC: 32.4 g/dL (ref 30.0–36.0)
MCV: 103.3 fL — ABNORMAL HIGH (ref 80.0–100.0)
Platelets: 460 10*3/uL — ABNORMAL HIGH (ref 150–400)
RBC: 3.61 MIL/uL — ABNORMAL LOW (ref 3.87–5.11)
RDW: 13.3 % (ref 11.5–15.5)
WBC: 10.7 10*3/uL — ABNORMAL HIGH (ref 4.0–10.5)
nRBC: 0 % (ref 0.0–0.2)

## 2022-09-01 LAB — GLUCOSE, CAPILLARY
Glucose-Capillary: 196 mg/dL — ABNORMAL HIGH (ref 70–99)
Glucose-Capillary: 91 mg/dL (ref 70–99)
Glucose-Capillary: 125 mg/dL — ABNORMAL HIGH (ref 70–99)

## 2022-09-01 LAB — SODIUM, URINE, RANDOM: Sodium, Ur: 55 mmol/L

## 2022-09-01 LAB — OSMOLALITY: Osmolality: 276 mOsm/kg (ref 275–295)

## 2022-09-01 NOTE — Discharge Instructions (Signed)
Restart your home furosemide on 09/03/22

## 2022-09-01 NOTE — TOC Progression Note (Addendum)
Transition of Care [facility]) - Progression Note    Patient Details  Name: Heather Olsen MRN: [id number] Date of Birth: Jan 14, 1931  [facility] CM/SW Contact  Henrietta Dine, RN Phone Number: 09/01/2022, 12:51 PM  Clinical Narrative:  pt from [facility]; LVM for [facility] [place], [facility] intake to verify pt's previous level of care; TOC will con't to follow.   [phone] contacted by Elizebeth Brooking from [facility]; she states the pt resides in the apartments/[place]; TOC will con't to follow.         Expected Discharge Plan and Services                                                 Social Determinants of Health (SDOH) Interventions    Readmission Risk Interventions     No data to display

## 2022-09-01 NOTE — Discharge Summary (Signed)
Physician Discharge Summary   Patient: Heather Olsen MRN: [id number] DOB: 1931/11/20  Admit date:     08/30/2022  Discharge date: 09/01/22  Discharge Physician: Marylu Lund   PCP: Deland Pretty, MD   Recommendations at discharge:   Follow up with PCP in 1-2 weeks Follow up with Cardiology as scheduled   Discharge Diagnoses: Principal Problem:   Acute on chronic diastolic CHF (congestive heart failure) ([place]) Active Problems:   Acute diverticulitis   Essential hypertension   HLD (hyperlipidemia)   PAF (paroxysmal atrial fibrillation) (HCC)   Type 2 diabetes mellitus with unspecified complications (HCC)   Gastroesophageal reflux disease   Long term (current) use of anticoagulants - on Eliquis for PAF   CHF (congestive heart failure) ([place])  Resolved Problems:   * No resolved hospital problems. [facility] Course: 88 year old female history of chronic diastolic heart failure, hyperlipidemia, paroxysmal atrial fibrillation on Eliquis, type 2 diabetes, who I originally admitted on 08/24/2022 for acute sigmoid diverticulitis.   During hospitalization, her Lasix was held as her p.o. intake was not adequate and she was placed on IV fluids.  She was discharged back to independent living at [facility] on 08/28/2022 with instructions not to restart her Lasix until 09/01/2022.  Patient has been without Lasix for the last 7 days.  She started feeling short of breath today.  Worse with walking.  She has also had diarrhea since starting her Augmentin 2 days ago.  Patient is also noticed increasing lower extremity edema for the last several days. Presented to the ER today for evaluation.  Assessment and Plan: * Acute on chronic diastolic CHF (congestive heart failure) (HCC) with pulmonary hypertension Continue with IV Lasix 40 mg twice daily.  Monitor serum creatinine. Continue pindolol 2.5 mg twice daily. Pt reports feeling much better since receiving lasix Ultimately able to ambulate with PT  maintaining O2 sats. PT recs for HHPT   Acute diverticulitis Patient states Augmentin gave her severe diarrhea.  Was continued on Keflex and Flagyl while admitted.  -Omnicef and flagyl were prescribed by EDP at time of initial presentation x 10 day supply, would complete that regimen   Long term (current) use of anticoagulants - on Eliquis for PAF Continue Eliquis 2.5 g twice daily.   Gastroesophageal reflux disease Continue Protonix 40 mg twice daily and 40 mg of Pepcid at bedtime.   Type 2 diabetes mellitus with unspecified complications (HCC) Held metformin during hospitalization.  Continued on SSI coverage   HLD (hyperlipidemia) Stable.  Continue Crestor 10 mg nightly.   Essential hypertension Continue pindolol 2.5 mg twice daily.  She will be on Lasix 40 mg twice daily.   Hypokalemia -replaced  Hypomagnesemia -will replace  Hyponatremia -Na down to 130 at time of d/c. By this point, pt had diuresed briskly with IV lasix and had clinical signs of early dehydration including rising Cr, increased thirst, dry membranes -Further lasix held -Recommend gentle self-hydration with PO fluids and to resume home po lasix 10/7       Consultants:  Procedures performed:   Disposition: Home Diet recommendation:  Cardiac diet DISCHARGE MEDICATION: Allergies as of 09/01/2022       Reactions   Topiramate Rash   Ace Inhibitors Cough   Codeine Nausea Only   Ibandronic Acid Nausea Only   Meloxicam    Other reaction(s): GI upset, GI Upset (intolerance), Other (See Comments) unknown   Prevacid [lansoprazole]    Chest pain   Sulfa Antibiotics Other (See Comments)  headaches   Verapamil Other (See Comments)   constipation   Zolpidem Other (See Comments)   hallucinations        Medication List     STOP taking these medications    amoxicillin-clavulanate 875-125 MG tablet Commonly known as: AUGMENTIN   celecoxib 200 MG capsule Commonly known as: CELEBREX    nitrofurantoin (macrocrystal-monohydrate) 100 MG capsule Commonly known as: MACROBID   ondansetron 4 MG disintegrating tablet Commonly known as: ZOFRAN-ODT       TAKE these medications    acetaminophen 500 MG tablet Commonly known as: TYLENOL Take 500-1,000 mg by mouth every 6 (six) hours as needed for moderate pain.   cefdinir 300 MG capsule Commonly known as: OMNICEF Take 1 capsule (300 mg total) by mouth 2 (two) times daily for 10 days.   CULTURELLE DIGESTIVE HEALTH PO Take 1 capsule by mouth every evening. Take one daily for probiotic   Eliquis 2.5 MG Tabs tablet Generic drug: apixaban Take 2.5 mg by mouth 2 (two) times daily.   famotidine 40 MG tablet Commonly known as: PEPCID Take 40 mg by mouth at bedtime.   furosemide 20 MG tablet Commonly known as: LASIX Take 1 tablet (20 mg total) by mouth daily.   hyoscyamine 0.125 MG SL tablet Commonly known as: LEVSIN SL Place under the tongue 3 (three) times daily as needed for cramping.   ipratropium 0.06 % nasal spray Commonly known as: ATROVENT Use 2 sprays in each nostril up to three times daily before meals for sinus drainage. What changed:  how much to take how to take this when to take this additional instructions   Linzess 145 MCG Caps capsule Generic drug: linaclotide Take 145 mcg by mouth daily as needed (constipation).   metFORMIN 500 MG 24 hr tablet Commonly known as: GLUCOPHAGE-XR Take 1 tablet by mouth every evening.   metroNIDAZOLE 500 MG tablet Commonly known as: Flagyl Take 1 tablet (500 mg total) by mouth 2 (two) times daily for 10 days. One po bid x 7 days   mirabegron ER 50 MG Tb24 tablet Commonly known as: MYRBETRIQ Take 1 tablet by mouth daily.   MULTIPLE VITAMINS-MINERALS PO Take 1 tablet by mouth daily.   neomycin-polymyxin b-dexamethasone 3.5-10000-0.1 Oint Commonly known as: MAXITROL Place into the left eye 2 (two) times daily.   nitroGLYCERIN 0.4 MG SL tablet Commonly  known as: NITROSTAT Place 1 tablet (0.4 mg total) under the tongue every 5 (five) minutes as needed for chest pain. Place one tablet under the tongue every 5 minutes as needed for chest pain. No more than 3   ondansetron 4 MG tablet Commonly known as: ZOFRAN Take 4 mg by mouth every 8 (eight) hours as needed for nausea or vomiting.   pantoprazole 40 MG tablet Commonly known as: PROTONIX Take 40 mg by mouth 2 (two) times daily.   pindolol 5 MG tablet Commonly known as: VISKEN Take 1/2 (one-half) tablet by mouth twice daily What changed: See the new instructions.   PROBIOTIC DAILY PO Take 1 capsule by mouth every evening.   Prolia 60 MG/ML Sosy injection Generic drug: denosumab Inject 60 mg into the skin every 6 (six) months.   rOPINIRole 0.5 MG tablet Commonly known as: REQUIP Take 0.5 mg by mouth at bedtime.   rosuvastatin 10 MG tablet Commonly known as: CRESTOR TAKE 1 TABLET BY MOUTH AT BEDTIME What changed: when to take this   sertraline 25 MG tablet Commonly known as: ZOLOFT Take 25 mg by mouth  at bedtime.   traMADol 50 MG tablet Commonly known as: ULTRAM TAKE 1 TABLET BY MOUTH EVERY 4 HOURS What changed: See the new instructions.   Vitamin D3 50 MCG (2000 UT) capsule Take 2,000 Units by mouth daily.        Follow-up Information     Deland Pretty, MD Follow up in 1 week(s).   Specialty: Internal Medicine Why: Hospital follow up Contact information: [address] [place] [address] [phone]         Minus Breeding, MD Follow up.   Specialty: Cardiology Why: as scheduled Contact information: [place] [address] [phone]                Discharge Exam: Danley Danker Weights   08/30/22 1324 09/01/22 0500  Weight: 50.3 kg 50 kg   General exam: Awake, laying in bed, in nad Respiratory system: Normal respiratory effort, no wheezing Cardiovascular system: regular rate, s1,  s2 Gastrointestinal system: Soft, nondistended, positive BS Central nervous system: CN2-12 grossly intact, strength intact Extremities: Perfused, no clubbing Skin: Normal skin turgor, no notable skin lesions seen Psychiatry: Mood normal // no visual hallucinations   Condition at discharge: fair  The results of significant diagnostics from this hospitalization (including imaging, microbiology, ancillary and laboratory) are listed below for reference.   Imaging Studies: CT Angio Chest PE W and/or Wo Contrast  Result Date: 08/30/2022 CLINICAL DATA:  Chest pain and shortness of breath. EXAM: CT ANGIOGRAPHY CHEST WITH CONTRAST TECHNIQUE: Multidetector CT imaging of the chest was performed using the standard protocol during bolus administration of intravenous contrast. Multiplanar CT image reconstructions and MIPs were obtained to evaluate the vascular anatomy. RADIATION DOSE REDUCTION: This exam was performed according to the departmental dose-optimization program which includes automated exposure control, adjustment of the mA and/or kV according to patient size and/or use of iterative reconstruction technique. CONTRAST:  48m OMNIPAQUE IOHEXOL 350 MG/ML SOLN COMPARISON:  CT abdomen and pelvis 08/17/2022. FINDINGS: Cardiovascular: The heart is enlarged. Aorta is normal in size. There are atherosclerotic calcifications of the aorta. There is no pericardial effusion. There is adequate opacification of the pulmonary arteries to the segmental level. There is no evidence for pulmonary embolism. Mediastinum/Nodes: There is a hypodense right thyroid nodule measuring 8 mm. There are no enlarged mediastinal or hilar lymph nodes. Esophagus is within normal limits. Lungs/Pleura: There is a small right pleural effusion. There is some atelectasis in the bilateral lower lobes. There are patchy multifocal ground-glass opacities in the bilateral upper lobes with scarring in both lung apices. Trachea and central airways are  patent. No pneumothorax. Upper Abdomen: No acute abnormality. Musculoskeletal: There is dextroconvex scoliosis of the thoracic spine. Review of the MIP images confirms the above findings. IMPRESSION: 1. No evidence for pulmonary embolism. 2. Small right pleural effusion. 3. Patchy multifocal ground-glass opacities in the bilateral upper lobes, nonspecific. Findings may be infectious/inflammatory or related to pulmonary edema. 4. Cardiomegaly. 5. 8 mm incidental right thyroid nodule. No follow-up imaging is recommended. Reference: J Am Coll Radiol. 2015 Feb;12(2): 143-50 Aortic Atherosclerosis (ICD10-I70.0). Electronically Signed   By: ARonney AstersM.D.   On: 08/30/2022 19:17   DG Chest 2 View  Result Date: 08/30/2022 CLINICAL DATA:  Provided history: Shortness of breath. EXAM: CHEST - 2 VIEW COMPARISON:  Chest radiographs 08/24/2022 and earlier. FINDINGS: Borderline cardiomegaly. Aortic atherosclerosis. Small right pleural effusion. Mild ill-defined opacity within the right lung base. 11 mm round opacity projecting in the region of  the right lung apex on the AP radiograph. No appreciable airspace consolidation on the left. No evidence of pneumothorax. Dextrocurvature and spondylosis of the thoracic spine. IMPRESSION: Ill-defined opacity within the right lung base, which may reflect atelectasis or pneumonia. Small right pleural effusion. These findings are new from the prior examination of 08/24/2022. 11 mm round opacity projecting in the region of the right lung apex, concerning for a pulmonary nodule. A chest CT is recommended for further evaluation. Borderline cardiomegaly. Aortic Atherosclerosis (ICD10-I70.0). Electronically Signed   By: Kellie Simmering D.O.   On: 08/30/2022 14:22   CT Abdomen Pelvis W Contrast  Result Date: 08/25/2022 CLINICAL DATA:  Abdominal pain for 1 month EXAM: CT ABDOMEN AND PELVIS WITH CONTRAST TECHNIQUE: Multidetector CT imaging of the abdomen and pelvis was performed using the  standard protocol following bolus administration of intravenous contrast. RADIATION DOSE REDUCTION: This exam was performed according to the departmental dose-optimization program which includes automated exposure control, adjustment of the mA and/or kV according to patient size and/or use of iterative reconstruction technique. CONTRAST:  30m OMNIPAQUE IOHEXOL 300 MG/ML  SOLN COMPARISON:  CT abdomen and pelvis 07/11/2022 FINDINGS: Lower chest: Cardiomegaly. Marked basilar atelectasis/scarring. No acute abnormality. Hepatobiliary: No suspicious focal hepatic lesion. No evidence of cholecystitis. No gallstones. No biliary dilation. Pancreas: Unremarkable. Spleen: Unremarkable. Adrenals/Urinary Tract: Adrenal glands are unremarkable. Cortical renal scarring in both kidneys. Multiple small hypoattenuating lesions statistically likely to be cysts. No follow-up is required. No urinary calculi. Unchanged mild right hydroureter. Distended bladder. Stomach/Bowel: Wall thickening and adjacent stranding about the sigmoid colon and rectum where there are numerous diverticula. Small amount of free fluid in the pelvis. No free intraperitoneal air. Normal caliber large and small bowel. Wall thickening of the stomach may be due to decompression though gastritis is not excluded. Vascular/Lymphatic: Aortic atherosclerosis. No enlarged abdominal or pelvic lymph nodes. Reproductive: Uterus and bilateral adnexa are unremarkable. Other: No abdominal wall hernia or abnormality. Musculoskeletal: Scoliosis. Degenerative changes in the hips and spine. No acute osseous abnormality. IMPRESSION: Acute sigmoid diverticulitis. No drainable fluid collection or free intraperitoneal air. Proctitis may be reactive, secondary to diverticulitis. Aortic Atherosclerosis (ICD10-I70.0). Electronically Signed   By: TPlacido SouM.D.   On: 08/25/2022 02:36   DG Abd Acute W/Chest  Result Date: 08/24/2022 CLINICAL DATA:  Abdominal pain EXAM: DG ABDOMEN  ACUTE WITH 1 VIEW CHEST COMPARISON:  CT and chest x-ray 07/11/2022. FINDINGS: Heart is borderline in size. Aortic atherosclerosis. Lungs clear. No effusions. Severe thoracolumbar scoliosis and degenerative changes. Nonobstructive bowel gas pattern. No organomegaly, free air or suspicious calcification. IMPRESSION: No acute findings. Electronically Signed   By: KRolm BaptiseM.D.   On: 08/24/2022 23:59   ECHOCARDIOGRAM COMPLETE  Result Date: 08/23/2022    ECHOCARDIOGRAM REPORT   Patient Name:   JZIONAMCNEIL Date of Exam: 08/23/2022 Medical Rec #:  [id number]  Height:       64.0 in Accession #:    [id number] Weight:       110.4 lb Date of Birth:  812/27/1932  BSA:          1.520 m Patient Age:    89years    BP:           118/72 mmHg Patient Gender: F           HR:           66 bpm. Exam Location:  Outpatient Procedure: 2D Echo, 3D Echo, Cardiac Doppler, Color Doppler and  Strain Analysis Indications:    shortness of breath  History:        Patient has prior history of Echocardiogram examinations, most                 recent 08/13/2020. CAD, Arrythmias:Atrial Fibrillation; Risk                 Factors:Dyslipidemia, Non-Smoker and Diabetes.  Sonographer:    Leavy Cella RDCS Referring Phys: [place]  1. Left ventricular ejection fraction, by estimation, is 60 to 65%. The left ventricle has normal function. The left ventricle has no regional wall motion abnormalities. Left ventricular diastolic parameters are consistent with Grade II diastolic dysfunction (pseudonormalization). Elevated left atrial pressure. The average left ventricular global longitudinal strain is -16.4 %. The global longitudinal strain is abnormal.  2. Right ventricular systolic function is normal. The right ventricular size is normal.  3. Left atrial size was mildly dilated.  4. Right atrial size was moderately dilated.  5. The mitral valve is normal in structure. No evidence of mitral valve regurgitation. No evidence of  mitral stenosis.  6. Tricuspid valve regurgitation is moderate.  7. The aortic valve is tricuspid. Aortic valve regurgitation is not visualized. No aortic stenosis is present.  8. Aortic dilatation noted. There is mild dilatation of the ascending aorta, measuring 40 mm.  9. The inferior vena cava is normal in size with greater than 50% respiratory variability, suggesting right atrial pressure of 3 mmHg. FINDINGS  Left Ventricle: Left ventricular ejection fraction, by estimation, is 60 to 65%. The left ventricle has normal function. The left ventricle has no regional wall motion abnormalities. The average left ventricular global longitudinal strain is -16.4 %. The global longitudinal strain is abnormal. The left ventricular internal cavity size was normal in size. There is no left ventricular hypertrophy. Left ventricular diastolic parameters are consistent with Grade II diastolic dysfunction (pseudonormalization). Elevated left atrial pressure. Right Ventricle: The right ventricular size is normal. Right ventricular systolic function is normal. Left Atrium: Left atrial size was mildly dilated. Right Atrium: Right atrial size was moderately dilated. Pericardium: There is no evidence of pericardial effusion. Mitral Valve: The mitral valve is normal in structure. No evidence of mitral valve regurgitation. No evidence of mitral valve stenosis. Tricuspid Valve: The tricuspid valve is normal in structure. Tricuspid valve regurgitation is moderate . No evidence of tricuspid stenosis. Aortic Valve: The aortic valve is tricuspid. Aortic valve regurgitation is not visualized. No aortic stenosis is present. Pulmonic Valve: The pulmonic valve was normal in structure. Pulmonic valve regurgitation is not visualized. No evidence of pulmonic stenosis. Aorta: Aortic dilatation noted. There is mild dilatation of the ascending aorta, measuring 40 mm. Venous: The inferior vena cava is normal in size with greater than 50% respiratory  variability, suggesting right atrial pressure of 3 mmHg. IAS/Shunts: No atrial level shunt detected by color flow Doppler.  LEFT VENTRICLE PLAX 2D LVIDd:         3.03 cm   Diastology LVIDs:         1.69 cm   LV e' medial:    4.13 cm/s LV PW:         1.16 cm   LV E/e' medial:  27.4 LV IVS:        0.89 cm   LV e' lateral:   6.96 cm/s LVOT diam:     1.80 cm   LV E/e' lateral: 16.2 LV SV:  76 LV SV Index:   37        2D Longitudinal Strain LVOT Area:     2.54 cm  2D Strain GLS Avg:     -16.4 %                           3D Volume EF:                          3D EF:        56 %                          LV EDV:       74 ml                          LV ESV:       33 ml                          LV SV:        41 ml RIGHT VENTRICLE RV Basal diam:  4.01 cm RV Mid diam:    3.40 cm RV S prime:     14.30 cm/s TAPSE (M-mode): 2.1 cm LEFT ATRIUM           Index        RIGHT ATRIUM           Index LA diam:      2.00 cm 1.32 cm/m   RA Area:     22.90 cm LA Vol (A2C): 20.7 ml 13.62 ml/m  RA Volume:   72.00 ml  47.38 ml/m LA Vol (A4C): 43.7 ml 28.75 ml/m  AORTIC VALVE LVOT Vmax:   88.70 cm/s LVOT Vmean:  57.500 cm/s LVOT VTI:    0.218 m  AORTA Ao Root diam: 3.10 cm Ao Asc diam:  4.00 cm MITRAL VALVE                TRICUSPID VALVE MV Area (PHT): 4.21 cm     TR Peak grad:   32.9 mmHg MV Decel Time: 180 msec     TR Vmax:        287.00 cm/s MV E velocity: 113.00 cm/s MV A velocity: 89.70 cm/s   SHUNTS MV E/A ratio:  1.26         Systemic VTI:  0.22 m                             Systemic Diam: 1.80 cm Kirk Ruths MD Electronically signed by Kirk Ruths MD Signature Date/Time: 08/23/2022/3:34:07 PM    Final     Microbiology: Results for orders placed or performed during the hospital encounter of 08/30/22  SARS Coronavirus 2 by RT PCR (hospital order, performed in [facility] hospital lab) *cepheid single result test* Anterior Nasal Swab     Status: None   Collection Time: 08/30/22  1:51 PM   Specimen: Anterior Nasal  Swab  Result Value Ref Range Status   SARS Coronavirus 2 by RT PCR NEGATIVE NEGATIVE Final    Comment: (NOTE) SARS-CoV-2 target nucleic acids are NOT DETECTED.  The SARS-CoV-2 RNA is generally detectable in upper and lower respiratory specimens during the acute phase of infection. The lowest concentration of SARS-CoV-2 viral copies this assay  can detect is 250 copies / mL. A negative result does not preclude SARS-CoV-2 infection and should not be used as the sole basis for treatment or other patient management decisions.  A negative result may occur with improper specimen collection / handling, submission of specimen other than nasopharyngeal swab, presence of viral mutation(s) within the areas targeted by this assay, and inadequate number of viral copies (<250 copies / mL). A negative result must be combined with clinical observations, patient history, and epidemiological information.  Fact Sheet for Patients:   https://www.patel.info/  Fact Sheet for Healthcare Providers: https://hall.com/  This test is not yet approved or  cleared by the [place] FDA and has been authorized for detection and/or diagnosis of SARS-CoV-2 by FDA under an Emergency Use Authorization (EUA).  This EUA will remain in effect (meaning this test can be used) for the duration of the COVID-19 declaration under Section 564(b)(1) of the Act, 21 U.S.C. section 360bbb-3(b)(1), unless the authorization is terminated or revoked sooner.  Performed at [facility], [place] [address], [place], [place] [address]     Labs: CBC: Recent Labs  Lab 08/26/22 [phone] 08/27/22 0659 08/28/22 0647 08/30/22 1351 08/31/22 0500 09/01/22 0718  WBC 16.7* 10.8* 7.1 8.2 9.8 10.7*  NEUTROABS 13.7*  --   --   --  7.1  --   HGB 10.1* 9.6* 9.1* 9.9* 10.9* 12.1  HCT 30.9* 30.1* 28.5* 29.6* 33.0* 37.3  MCV 104.0* 104.5* 104.4* 101.0* 101.5* 103.3*  PLT 336 350 348  365 379 941*   Basic Metabolic Panel: Recent Labs  Lab 08/27/22 0659 08/28/22 0647 08/30/22 1351 08/31/22 0500 09/01/22 0718  NA 133* 137 134* 132* 130*  K 3.1* 4.5 3.9 3.2* 4.1  CL 100 108 103 97* 95*  CO2 '23 22 22 26 24  '$ GLUCOSE 117* 124* 166* 155* 161*  BUN '14 15 9 9 15  '$ CREATININE 0.80 0.74 0.72 0.71 1.09*  CALCIUM 8.4* 7.9* 8.8* 8.5* 8.6*  MG 1.8 1.7  --  1.6*  --   PHOS 2.6 1.8*  --   --   --    Liver Function Tests: Recent Labs  Lab 08/26/22 0634 08/27/22 0659 08/28/22 0647 08/31/22 0500 09/01/22 0718  AST 19  --   --  24 29  ALT 12  --   --  16 18  ALKPHOS 65  --   --  83 85  BILITOT 0.6  --   --  0.3 0.6  PROT 5.8*  --   --  6.7 7.2  ALBUMIN 2.8* 2.9* 2.7* 3.4* 3.6   CBG: Recent Labs  Lab 08/31/22 1755 08/31/22 2053 09/01/22 0748 09/01/22 1111 09/01/22 1709  GLUCAP 115* 150* 196* 91 125*    Discharge time spent: less than 30 minutes.  Signed: Marylu Lund, MD [facility] 09/01/2022

## 2022-09-01 NOTE — Evaluation (Signed)
Physical Therapy Evaluation Patient Details Name: Heather Olsen MRN: [id number] DOB: [birth year] Today's Date: 09/01/2022  History of Present Illness  88 year old female history of chronic diastolic heart failure, hyperlipidemia, paroxysmal atrial fibrillation on Eliquis, type 2 diabetes, who I originally admitted on 08/24/2022 for acute sigmoid diverticulitis.  Clinical Impression  Pt presents with dependencies in mobility secondary to the above diagnosis. Pt reports feeling weak and tired. Pt's O2 sats at 94 % at rest on RA. During mobility and gait O2 sats dropped to 89 % on RA. Educated on pursed lip breathing. Pt stated she lives at [facility] independent [facility]. Anticipate pt can return with HHPT services. Encourage OOB for meals and ambulation to the [facility] or bathroom. Pt will also benefit from mobility team referral to increase endurance and activity tolerance while monitoring O2 sats. Pt will continue to benefit from continued acute skilled PT to maximize mobility and independence for d/c to [facility].      Recommendations for follow up therapy are one component of a multi-disciplinary discharge planning process, led by the attending physician.  Recommendations may be updated based on patient status, additional functional criteria and insurance authorization.  Follow Up Recommendations Home health PT      Assistance Recommended at Discharge PRN  Patient can return home with the following  Help with stairs or ramp for entrance;Assistance with cooking/housework    Equipment Recommendations None recommended by PT  Recommendations for Other Services       Functional Status Assessment Patient has had a recent decline in their functional status and demonstrates the ability to make significant improvements in function in a reasonable and predictable amount of time.     Precautions / Restrictions Precautions Precautions: Fall Restrictions Weight Bearing Restrictions: No       Mobility  Bed Mobility Overal bed mobility: Modified Independent                  Transfers Overall transfer level: Needs assistance Equipment used: Rolling walker (2 wheels) Transfers: Sit to/from Stand Sit to Stand: Min guard                Ambulation/Gait Ambulation/Gait assistance: Min guard Gait Distance (Feet): 25 Feet Assistive device: Rolling walker (2 wheels) Gait Pattern/deviations: Decreased stride length, Step-through pattern, Narrow base of support Gait velocity: decr        Stairs            Wheelchair Mobility    Modified Rankin (Stroke Patients Only)       Balance Overall balance assessment: Needs assistance   Sitting balance-Leahy Scale: Good     Standing balance support: Bilateral upper extremity supported Standing balance-Leahy Scale: Fair                               Pertinent Vitals/Pain Pain Assessment Pain Assessment: No/denies pain    Home Living Family/patient expects to be discharged to:: Private residence Living Arrangements: Spouse/significant other Available Help at Discharge: Family Type of Home: Independent living facility Home Access: Level entry         Home Equipment: Shower seat - built in;Rollator (4 wheels);Cane - single Barista (2 wheels)      Prior Function               Mobility Comments: She did not use an assistive device for ambulation inside, however used a cane when ambulating to & from [employer] and  when out in the community.       Hand Dominance        Extremity/Trunk Assessment        Lower Extremity Assessment Lower Extremity Assessment: Overall WFL for tasks assessed    Cervical / Trunk Assessment Cervical / Trunk Assessment: Normal  Communication   Communication: No difficulties  Cognition Arousal/Alertness: Awake/alert Behavior During Therapy: WFL for tasks assessed/performed Overall Cognitive Status: Within Functional  Limits for tasks assessed                                          General Comments General comments (skin integrity, edema, etc.): decr O2 sats with activity, educated on pursed lup breathing, gave pt an incentive spirometer and demonstrated technique    Exercises     Assessment/Plan    PT Assessment Patient needs continued PT services  PT Problem List Decreased balance;Decreased mobility;Decreased knowledge of use of DME;Cardiopulmonary status limiting activity;Decreased activity tolerance       PT Treatment Interventions Gait training;Functional mobility training;Therapeutic activities;Patient/family education;Balance training;Therapeutic exercise;DME instruction    PT Goals (Current goals can be found in the Care Plan section)  Acute Rehab PT Goals Patient Stated Goal: to return to [facility] PT Goal Formulation: With patient Time For Goal Achievement: 09/15/22 Potential to Achieve Goals: Good    Frequency Min 3X/week     Co-evaluation               AM-PAC PT "6 Clicks" Mobility  Outcome Measure Help needed turning from your back to your side while in a flat bed without using bedrails?: None Help needed moving from lying on your back to sitting on the side of a flat bed without using bedrails?: None Help needed moving to and from a bed to a chair (including a wheelchair)?: A Little Help needed standing up from a chair using your arms (e.g., wheelchair or bedside chair)?: A Little Help needed to walk in hospital room?: A Little Help needed climbing 3-5 steps with a railing? : A Little 6 Click Score: 20    End of Session Equipment Utilized During Treatment: Gait belt Activity Tolerance: Patient limited by fatigue Patient left: in bed;with call bell/phone within reach;with bed alarm set Nurse Communication: Mobility status PT Visit Diagnosis: Difficulty in walking, not elsewhere classified (R26.2)    Time: 7628-3151 PT  Time Calculation (min) (ACUTE ONLY): 26 min   Charges:   PT Evaluation $PT Eval Moderate Complexity: 1 Mod PT Treatments $Gait Training: 8-22 mins         Theodoro Grist Kerstine 09/01/2022, 1:35 PM

## 2022-09-01 NOTE — TOC Progression Note (Signed)
Transition of Care [facility]) - Progression Note    Patient Details  Name: Heather Olsen MRN: [id number] Date of Birth: 1930/12/15  Transition of Care [facility]) CM/SW Contact  Henrietta Dine, RN Phone Number: 09/01/2022, 1:06 PM  Clinical Narrative:  TOC order received for Heart Failure Home Health Screening. Patient has been reviewed and does not meet criteria.            Expected Discharge Plan and Services                                                 Social Determinants of Health (SDOH) Interventions    Readmission Risk Interventions     No data to display

## 2022-09-02 LAB — OSMOLALITY, URINE: Osmolality, Ur: 285 mOsm/kg — ABNORMAL LOW (ref 300–900)

## 2022-09-07 DIAGNOSIS — R1319 Other dysphagia: Secondary | ICD-10-CM | POA: Diagnosis not present

## 2022-09-07 DIAGNOSIS — K222 Esophageal obstruction: Secondary | ICD-10-CM | POA: Diagnosis not present

## 2022-09-07 DIAGNOSIS — R10814 Left lower quadrant abdominal tenderness: Secondary | ICD-10-CM | POA: Diagnosis not present

## 2022-09-07 DIAGNOSIS — K2289 Other specified disease of esophagus: Secondary | ICD-10-CM | POA: Diagnosis not present

## 2022-09-07 DIAGNOSIS — K5792 Diverticulitis of intestine, part unspecified, without perforation or abscess without bleeding: Secondary | ICD-10-CM | POA: Diagnosis not present

## 2022-09-07 DIAGNOSIS — R112 Nausea with vomiting, unspecified: Secondary | ICD-10-CM | POA: Diagnosis not present

## 2022-09-08 DIAGNOSIS — M6281 Muscle weakness (generalized): Secondary | ICD-10-CM | POA: Diagnosis not present

## 2022-09-08 DIAGNOSIS — R2689 Other abnormalities of gait and mobility: Secondary | ICD-10-CM | POA: Diagnosis not present

## 2022-09-08 DIAGNOSIS — R2681 Unsteadiness on feet: Secondary | ICD-10-CM | POA: Diagnosis not present

## 2022-09-08 DIAGNOSIS — R278 Other lack of coordination: Secondary | ICD-10-CM | POA: Diagnosis not present

## 2022-09-08 DIAGNOSIS — R0602 Shortness of breath: Secondary | ICD-10-CM | POA: Diagnosis not present

## 2022-09-08 DIAGNOSIS — K5792 Diverticulitis of intestine, part unspecified, without perforation or abscess without bleeding: Secondary | ICD-10-CM | POA: Diagnosis not present

## 2022-09-09 DIAGNOSIS — R2681 Unsteadiness on feet: Secondary | ICD-10-CM | POA: Diagnosis not present

## 2022-09-09 DIAGNOSIS — K5792 Diverticulitis of intestine, part unspecified, without perforation or abscess without bleeding: Secondary | ICD-10-CM | POA: Diagnosis not present

## 2022-09-09 DIAGNOSIS — R278 Other lack of coordination: Secondary | ICD-10-CM | POA: Diagnosis not present

## 2022-09-09 DIAGNOSIS — M6281 Muscle weakness (generalized): Secondary | ICD-10-CM | POA: Diagnosis not present

## 2022-09-09 DIAGNOSIS — R2689 Other abnormalities of gait and mobility: Secondary | ICD-10-CM | POA: Diagnosis not present

## 2022-09-09 DIAGNOSIS — R0602 Shortness of breath: Secondary | ICD-10-CM | POA: Diagnosis not present

## 2022-09-12 DIAGNOSIS — R278 Other lack of coordination: Secondary | ICD-10-CM | POA: Diagnosis not present

## 2022-09-12 DIAGNOSIS — M6281 Muscle weakness (generalized): Secondary | ICD-10-CM | POA: Diagnosis not present

## 2022-09-12 DIAGNOSIS — R2681 Unsteadiness on feet: Secondary | ICD-10-CM | POA: Diagnosis not present

## 2022-09-12 DIAGNOSIS — R2689 Other abnormalities of gait and mobility: Secondary | ICD-10-CM | POA: Diagnosis not present

## 2022-09-12 DIAGNOSIS — K5792 Diverticulitis of intestine, part unspecified, without perforation or abscess without bleeding: Secondary | ICD-10-CM | POA: Diagnosis not present

## 2022-09-12 DIAGNOSIS — R0602 Shortness of breath: Secondary | ICD-10-CM | POA: Diagnosis not present

## 2022-09-13 DIAGNOSIS — R278 Other lack of coordination: Secondary | ICD-10-CM | POA: Diagnosis not present

## 2022-09-13 DIAGNOSIS — K5792 Diverticulitis of intestine, part unspecified, without perforation or abscess without bleeding: Secondary | ICD-10-CM | POA: Diagnosis not present

## 2022-09-13 DIAGNOSIS — R0602 Shortness of breath: Secondary | ICD-10-CM | POA: Diagnosis not present

## 2022-09-13 DIAGNOSIS — M6281 Muscle weakness (generalized): Secondary | ICD-10-CM | POA: Diagnosis not present

## 2022-09-13 DIAGNOSIS — R2689 Other abnormalities of gait and mobility: Secondary | ICD-10-CM | POA: Diagnosis not present

## 2022-09-13 DIAGNOSIS — R2681 Unsteadiness on feet: Secondary | ICD-10-CM | POA: Diagnosis not present

## 2022-09-14 DIAGNOSIS — K5792 Diverticulitis of intestine, part unspecified, without perforation or abscess without bleeding: Secondary | ICD-10-CM | POA: Diagnosis not present

## 2022-09-14 DIAGNOSIS — R2689 Other abnormalities of gait and mobility: Secondary | ICD-10-CM | POA: Diagnosis not present

## 2022-09-14 DIAGNOSIS — M6281 Muscle weakness (generalized): Secondary | ICD-10-CM | POA: Diagnosis not present

## 2022-09-14 DIAGNOSIS — R278 Other lack of coordination: Secondary | ICD-10-CM | POA: Diagnosis not present

## 2022-09-14 DIAGNOSIS — R2681 Unsteadiness on feet: Secondary | ICD-10-CM | POA: Diagnosis not present

## 2022-09-14 DIAGNOSIS — R0602 Shortness of breath: Secondary | ICD-10-CM | POA: Diagnosis not present

## 2022-09-15 DIAGNOSIS — M545 Low back pain, unspecified: Secondary | ICD-10-CM | POA: Diagnosis not present

## 2022-09-15 DIAGNOSIS — R300 Dysuria: Secondary | ICD-10-CM | POA: Diagnosis not present

## 2022-09-17 NOTE — Progress Notes (Signed)
Cardiology Office Note   Date:  09/20/2022   ID:  Heather Olsen, Heather Olsen 13-Sep-1931, MRN [id number]  PCP:  Merri Brunette, MD  Cardiologist:   Rollene Rotunda, MD   Chief Complaint  Patient presents with   Shortness of Breath      History of Present Illness: Heather Olsen is a 88 y.o. female who presents for follow up of CAD. She had stent placement in 2008 and was followed by [facility].  She had an LAD stent placed in 2008 which was a 3.8 mm Vision. She had follow-up cath in 2009 with 30%  narrowing of the stent but otherwise no disease. Stress perfusion study in 2014 was negative. She does have some tricuspid regurgitation and some mild mitral regurgitation.  Lexiscan Myoview was negative in 2017.   She had dizziness and was found to have short runs of atrial fib.   She had tachy brady palpitations and so I switched her beta blocker to pindolol.   She had SOB and an echo demonstrated NL LV function with moderately elevated pulmonary pressures.   BNP was mildly elevated.    Since I last saw her she was in the hospital with acute on chronic diastolic HF.  I reviewed these records for this visit.   I had seen her in September and she had some mild increase shortness of breath with a mildly elevated BNP.  There was some evidence of diastolic dysfunction on echo I did at that time.  Not long after that she was in the hospital with diverticulitis.  There is some confusion as a review these notes for this visit.  The discharge summary from the first admission says that she was to hold her Lasix for 4 days but she does not recall doing that.  She came back to the ER a few days later and was admitted with acute on chronic diastolic heart failure.  She believes that she was instructed not to take a diuretic when she got home from that second visit and she says she held it for 4 days.  She is back on 20 mg of Lasix now.  She has a little lower extremity swelling.  She was told that she needed to  hydrate because of some renal insufficiency though I do not see that her creatinine jumped during either hospitalization.  Her weights when she got home looked to be 105.4 pounds after the second hospitalization and they crept up to 109 pounds but now she is back down to 107.  She says her breathing is much better than when she was admitted the second time.  She is not having any PND or orthopnea.  She is not having any new chest pressure, neck or arm discomfort.  She does have some increased lower extremity swelling.   Past Medical History:  Diagnosis Date   Aortic stenosis 04/12/2016   none noted on echo done 08/13/20   Coronary artery disease    Depression    Diverticulosis    Hearing loss 04/12/2016   Hiatal hernia 04/12/2016   HLD (hyperlipidemia) 04/12/2016   Hyperglycemia 09/14/2016   Hypertension    Idiopathic scoliosis 04/12/2016   Major depression 04/12/2016   Mitral regurgitation    Osteoporosis, senile 04/12/2016   Pernicious anemia    Raynaud's disease 04/12/2016   Scoliosis    Spinal stenosis of lumbar region 04/12/2016    Past Surgical History:  Procedure Laterality Date   APPENDECTOMY  BREAST SURGERY     broken wrist Right 2007   CARPAL TUNNEL RELEASE Left 04/04/2017   Procedure: LEFT CARPAL TUNNEL RELEASE;  Surgeon: Cindee Salt, MD;  Location: [facility];  Service: Orthopedics;  Laterality: Left;  REG/FAB   CARPAL TUNNEL RELEASE Right 12/01/2020   Procedure: CARPAL TUNNEL RELEASE;  Surgeon: Cindee Salt, MD;  Location: [facility];  Service: Orthopedics;  Laterality: Right;  IV REGIONAL FOREARM BLOCK   CATARACT EXTRACTION W/ INTRAOCULAR LENS  IMPLANT, BILATERAL Bilateral 2017   COLONOSCOPY  2016   FOOT SURGERY Right    PERIPHERAL VASCULAR INTERVENTION  11/11/2019   Procedure: PERIPHERAL VASCULAR INTERVENTION;  Surgeon: Maeola Harman, MD;  Location: [facility] INVASIVE CV LAB;  Service: Cardiovascular;;   TONSILLECTOMY     VISCERAL  ANGIOGRAPHY N/A 11/11/2019   Procedure: VISCERAL ANGIOGRAPHY;  Surgeon: Maeola Harman, MD;  Location[facility] INVASIVE CV LAB;  Service: Cardiovascular;  Laterality: N/A;     Current Outpatient Medications  Medication Sig Dispense Refill   acetaminophen (TYLENOL) 500 MG tablet Take 500-1,000 mg by mouth every 6 (six) hours as needed for moderate pain.     Cholecalciferol (VITAMIN D3) 2000 units capsule Take 2,000 Units by mouth daily.      denosumab (PROLIA) 60 MG/ML SOSY injection Inject 60 mg into the skin every 6 (six) months.     ELIQUIS 2.5 MG TABS tablet Take 2.5 mg by mouth 2 (two) times daily.     famotidine (PEPCID) 40 MG tablet Take 40 mg by mouth at bedtime.     furosemide (LASIX) 20 MG tablet Take 1 tablet (20 mg total) by mouth daily. 30 tablet 0   hyoscyamine (LEVSIN SL) 0.125 MG SL tablet Place under the tongue 3 (three) times daily as needed for cramping.     ipratropium (ATROVENT) 0.06 % nasal spray Use 2 sprays in each nostril up to three times daily before meals for sinus drainage. (Patient taking differently: Place 2 sprays into the nose daily.) 30 mL 3   Lactobacillus-Inulin (CULTURELLE DIGESTIVE HEALTH PO) Take 1 capsule by mouth every evening. Take one daily for probiotic     LINZESS 145 MCG CAPS capsule Take 145 mcg by mouth daily as needed (constipation).     metFORMIN (GLUCOPHAGE-XR) 500 MG 24 hr tablet Take 1 tablet by mouth every evening.     mirabegron ER (MYRBETRIQ) 50 MG TB24 tablet Take 1 tablet by mouth daily.     MULTIPLE VITAMINS-MINERALS PO Take 1 tablet by mouth daily.      neomycin-polymyxin b-dexamethasone (MAXITROL) 3.5-10000-0.1 OINT Place into the left eye 2 (two) times daily.     nitroGLYCERIN (NITROSTAT) 0.4 MG SL tablet Place 1 tablet (0.4 mg total) under the tongue every 5 (five) minutes as needed for chest pain. Place one tablet under the tongue every 5 minutes as needed for chest pain. No more than 3 30 tablet 0   ondansetron (ZOFRAN) 4 MG  tablet Take 4 mg by mouth every 8 (eight) hours as needed for nausea or vomiting.     pantoprazole (PROTONIX) 40 MG tablet Take 40 mg by mouth 2 (two) times daily.     pindolol (VISKEN) 5 MG tablet Take 1/2 (one-half) tablet by mouth twice daily (Patient taking differently: Take 2.5 mg by mouth 2 (two) times daily.) 90 tablet 3   Probiotic Product (PROBIOTIC DAILY PO) Take 1 capsule by mouth every evening.     rOPINIRole (REQUIP) 0.5 MG tablet Take 0.5  mg by mouth at bedtime.     rosuvastatin (CRESTOR) 10 MG tablet TAKE 1 TABLET BY MOUTH AT BEDTIME (Patient taking differently: Take 10 mg by mouth daily.) 30 tablet 0   sertraline (ZOLOFT) 25 MG tablet Take 25 mg by mouth at bedtime.     traMADol (ULTRAM) 50 MG tablet TAKE 1 TABLET BY MOUTH EVERY 4 HOURS (Patient taking differently: Take 50 mg by mouth every 12 (twelve) hours as needed for moderate pain or severe pain (pain).) 75 tablet 0   No current facility-administered medications for this visit.    Allergies:   Topiramate, Ace inhibitors, Codeine, Ibandronic acid, Meloxicam, Prevacid [lansoprazole], Sulfa antibiotics, Verapamil, and Zolpidem    ROS:  Please see the history of present illness.   Otherwise, review of systems are positive for back .   All other systems are reviewed and negative.    PHYSICAL EXAM: VS:  BP 116/68   Pulse 66   Ht 5\' 4"  (1.626 m)   Wt 110 lb (49.9 kg)   SpO2 100%   BMI 18.88 kg/m  , BMI Body mass index is 18.88 kg/m.  GENERAL:  Well appearing NECK:  No jugular venous distention, waveform within normal limits, carotid upstroke brisk and symmetric, no bruits, no thyromegaly LUNGS:  Clear to auscultation bilaterally CHEST:  Unremarkable HEART:  PMI not displaced or sustained,S1 and S2 within normal limits, no S3, no S4, no clicks, no rubs, 2 out of 6 apical systolic murmur radiating at the aortic outflow tract, no diastolic murmurs ABD:  Flat, positive bowel sounds normal in frequency in pitch, no bruits, no  rebound, no guarding, no midline pulsatile mass, no hepatomegaly, no splenomegaly EXT:  2 plus pulses throughout, mild bilateral lower extremity edema, no cyanosis no clubbing   EKG:  EKG is   ordered today. Sinus rhythm, rate 66, left axis deviation, premature atrial contractions, first-degree AV block, poor anterior R wave progression, no acute ST-T wave changes.  Premature atrial contractions   Recent Labs: 08/30/2022: B Natriuretic Peptide 1,103.7 08/31/2022: Magnesium 1.6 09/01/2022: ALT 18; BUN 15; Creatinine, Ser 1.09; Hemoglobin 12.1; Platelets 460; Potassium 4.1; Sodium 130    Lipid Panel    Component Value Date/Time   CHOL 232 (H) 09/14/2016 1415   TRIG 168 (H) 09/14/2016 1415   HDL 99 09/14/2016 1415   CHOLHDL 2.3 09/14/2016 1415   VLDL 34 (H) 09/14/2016 1415   LDLCALC 99 09/14/2016 1415      Wt Readings from Last 3 Encounters:  09/20/22 110 lb (49.9 kg)  09/01/22 110 lb 3.7 oz (50 kg)  08/26/22 111 lb (50.3 kg)      Other studies Reviewed: Additional studies/ records that were reviewed today include: Hospital records x2 admissions Review of the above records demonstrates:  See elsewhere  ASSESSMENT AND PLAN:  CAD:    The patient has no new sypmtoms.  No further cardiovascular testing is indicated.  We will continue with aggressive risk reduction and meds as listed.  ATRIAL FIB:      Ms. Heather Olsen has a CHA2DS2 - VASc score of 5.  She tolerates anticoagulation.  She is in sinus today.  She does not feel paroxysms.  No change in therapy.   HTN: The blood pressure was at target.  No change in therapy.    PULMONARY HTN:       There was no mention of pulmonary hypertension on the recent echo.   CHRONIC DIASTOLIC HF: We had a long discussion about  Lasix dosing.  She has been hydrating orally I have asked her to reduce that.  We talked about as needed dosing of her diuretic if her weight goes up a couple of pounds.  We talked about cutting back on her diuretic if  she starts to get lightheaded and her blood pressure goes down.  I think at target weight is around 107 pounds.  Current medicines are reviewed at length with the patient today.  The patient does not have  concerns regarding medicines.  The following changes have been made: None  Labs/ tests ordered today include:    None  Orders Placed This Encounter  Procedures   EKG 12-Lead     Disposition:   FU with me or APP in about 4 months    Signed, Rollene Rotunda, MD  09/20/2022 4:15 PM    [facility]

## 2022-09-19 DIAGNOSIS — M810 Age-related osteoporosis without current pathological fracture: Secondary | ICD-10-CM | POA: Diagnosis not present

## 2022-09-19 DIAGNOSIS — R2689 Other abnormalities of gait and mobility: Secondary | ICD-10-CM | POA: Diagnosis not present

## 2022-09-19 DIAGNOSIS — R0602 Shortness of breath: Secondary | ICD-10-CM | POA: Diagnosis not present

## 2022-09-19 DIAGNOSIS — Z23 Encounter for immunization: Secondary | ICD-10-CM | POA: Diagnosis not present

## 2022-09-19 DIAGNOSIS — M6281 Muscle weakness (generalized): Secondary | ICD-10-CM | POA: Diagnosis not present

## 2022-09-19 DIAGNOSIS — K5792 Diverticulitis of intestine, part unspecified, without perforation or abscess without bleeding: Secondary | ICD-10-CM | POA: Diagnosis not present

## 2022-09-19 DIAGNOSIS — R2681 Unsteadiness on feet: Secondary | ICD-10-CM | POA: Diagnosis not present

## 2022-09-19 DIAGNOSIS — R278 Other lack of coordination: Secondary | ICD-10-CM | POA: Diagnosis not present

## 2022-09-20 ENCOUNTER — Ambulatory Visit: Payer: Medicare Other | Attending: Cardiology | Admitting: Cardiology

## 2022-09-20 ENCOUNTER — Encounter: Payer: Self-pay | Admitting: Cardiology

## 2022-09-20 VITALS — BP 116/68 | HR 66 | Ht 64.0 in | Wt 110.0 lb

## 2022-09-20 DIAGNOSIS — R0602 Shortness of breath: Secondary | ICD-10-CM | POA: Diagnosis not present

## 2022-09-20 DIAGNOSIS — I2510 Atherosclerotic heart disease of native coronary artery without angina pectoris: Secondary | ICD-10-CM

## 2022-09-20 DIAGNOSIS — I480 Paroxysmal atrial fibrillation: Secondary | ICD-10-CM | POA: Diagnosis not present

## 2022-09-20 DIAGNOSIS — I10 Essential (primary) hypertension: Secondary | ICD-10-CM | POA: Insufficient documentation

## 2022-09-20 DIAGNOSIS — I5032 Chronic diastolic (congestive) heart failure: Secondary | ICD-10-CM | POA: Diagnosis not present

## 2022-09-20 DIAGNOSIS — R278 Other lack of coordination: Secondary | ICD-10-CM | POA: Diagnosis not present

## 2022-09-20 DIAGNOSIS — M545 Low back pain, unspecified: Secondary | ICD-10-CM | POA: Diagnosis not present

## 2022-09-20 DIAGNOSIS — M47816 Spondylosis without myelopathy or radiculopathy, lumbar region: Secondary | ICD-10-CM | POA: Diagnosis not present

## 2022-09-20 DIAGNOSIS — M6281 Muscle weakness (generalized): Secondary | ICD-10-CM | POA: Diagnosis not present

## 2022-09-20 DIAGNOSIS — R2681 Unsteadiness on feet: Secondary | ICD-10-CM | POA: Diagnosis not present

## 2022-09-20 DIAGNOSIS — M4120 Other idiopathic scoliosis, site unspecified: Secondary | ICD-10-CM | POA: Diagnosis not present

## 2022-09-20 DIAGNOSIS — K5792 Diverticulitis of intestine, part unspecified, without perforation or abscess without bleeding: Secondary | ICD-10-CM | POA: Diagnosis not present

## 2022-09-20 DIAGNOSIS — R2689 Other abnormalities of gait and mobility: Secondary | ICD-10-CM | POA: Diagnosis not present

## 2022-09-20 NOTE — Patient Instructions (Signed)
Medication Instructions:  No changes *If you need a refill on your cardiac medications before your next appointment, please call your pharmacy*   Lab Work: None ordered If you have labs (blood work) drawn today and your tests are completely normal, you will receive your results only by: [place] (if you have MyChart) OR A paper copy in the mail If you have any lab test that is abnormal or we need to change your treatment, we will call you to review the results.   Testing/Procedures: None ordered   Follow-Up: At [facility], you and your health needs are our priority.  As part of our continuing mission to provide you with exceptional heart care, we have created designated Provider Care Teams.  These Care Teams include your primary Cardiologist (physician) and Advanced Practice Providers (APPs -  Physician Assistants and Nurse Practitioners) who all work together to provide you with the care you need, when you need it.  We recommend signing up for the patient portal called "MyChart".  Sign up information is provided on this After Visit Summary.  MyChart is used to connect with patients for Virtual Visits (Telemedicine).  Patients are able to view lab/test results, encounter notes, upcoming appointments, etc.  Non-urgent messages can be sent to your provider as well.   To learn more about what you can do with MyChart, go to [email or web address].    Your next appointment:   4 month(s)  The format for your next appointment:   In Person  Provider:   APP

## 2022-09-21 DIAGNOSIS — R0602 Shortness of breath: Secondary | ICD-10-CM | POA: Diagnosis not present

## 2022-09-21 DIAGNOSIS — R2689 Other abnormalities of gait and mobility: Secondary | ICD-10-CM | POA: Diagnosis not present

## 2022-09-21 DIAGNOSIS — M6281 Muscle weakness (generalized): Secondary | ICD-10-CM | POA: Diagnosis not present

## 2022-09-21 DIAGNOSIS — R2681 Unsteadiness on feet: Secondary | ICD-10-CM | POA: Diagnosis not present

## 2022-09-21 DIAGNOSIS — R278 Other lack of coordination: Secondary | ICD-10-CM | POA: Diagnosis not present

## 2022-09-21 DIAGNOSIS — K5792 Diverticulitis of intestine, part unspecified, without perforation or abscess without bleeding: Secondary | ICD-10-CM | POA: Diagnosis not present

## 2022-09-22 ENCOUNTER — Telehealth: Payer: Self-pay | Admitting: Cardiology

## 2022-09-22 DIAGNOSIS — R2689 Other abnormalities of gait and mobility: Secondary | ICD-10-CM | POA: Diagnosis not present

## 2022-09-22 DIAGNOSIS — R2681 Unsteadiness on feet: Secondary | ICD-10-CM | POA: Diagnosis not present

## 2022-09-22 DIAGNOSIS — K5792 Diverticulitis of intestine, part unspecified, without perforation or abscess without bleeding: Secondary | ICD-10-CM | POA: Diagnosis not present

## 2022-09-22 DIAGNOSIS — M6281 Muscle weakness (generalized): Secondary | ICD-10-CM | POA: Diagnosis not present

## 2022-09-22 DIAGNOSIS — R0602 Shortness of breath: Secondary | ICD-10-CM | POA: Diagnosis not present

## 2022-09-22 DIAGNOSIS — R278 Other lack of coordination: Secondary | ICD-10-CM | POA: Diagnosis not present

## 2022-09-22 MED ORDER — FUROSEMIDE 20 MG PO TABS: 20.0000 mg | ORAL_TABLET | Freq: Every day | ORAL | 5 refills | Status: DC

## 2022-09-22 NOTE — Telephone Encounter (Signed)
Patient with diagnosis of afib on Eliquis for anticoagulation.    Procedure:   Endoscopy with Dialation Date of procedure: TBD   CHA2DS2-VASc Score = 7   This indicates a 11.2% annual risk of stroke. The patient's score is based upon: CHF History: 1 HTN History: 1 Diabetes History: 1 Stroke History: 0 Vascular Disease History: 1 Age Score: 2 Gender Score: 1      CrCl 30 ml/min  Per office protocol, patient can hold Eliquis for 1-2 days prior to procedure.    **This guidance is not considered finalized until pre-operative APP has relayed final recommendations.**

## 2022-09-22 NOTE — Telephone Encounter (Signed)
   Pre-operative Risk Assessment    Patient Name: Heather Olsen  DOB: [birth year] MRN: [id number]      Request for Surgical Clearance    Procedure:   Endoscopy with Dialation  Date of Surgery:  Clearance  TBD                                 Surgeon:  Dr Rolene Course Surgeon's Group or Practice Name:   Phone number:   [phone] Fax number:  [phone]   Type of Clearance Requested:   - Medical    Type of Anesthesia:  MAC   Additional requests/questions:    Signed, Glyn Ade   09/22/2022, 9:55 AM

## 2022-09-22 NOTE — Telephone Encounter (Signed)
*  STAT* If patient is at the pharmacy, call can be transferred to refill team.   1. Which medications need to be refilled? (please list name of each medication and dose if known)   furosemide (LASIX) 20 MG tablet  2. Which pharmacy/location (including street and city if local pharmacy) is medication to be sent to?  [place], [place]  3. Do they need a 30 day or 90 day supply? 90 day  Patient has a few tablets left.

## 2022-09-22 NOTE — Telephone Encounter (Signed)
   Patient Name: Kareema Malkowski  DOB: 1930/12/01 MRN: [id number]  Primary Cardiologist: Minus Breeding, MD  Chart reviewed as part of pre-operative protocol coverage. Given past medical history and time since last visit, based on ACC/AHA guidelines, Hayle Cheramie is at acceptable risk for the planned procedure without further cardiovascular testing.   Hx of CAD, atrial fibrillation, HTN, pulmonary hypertension, diastolic heart failure. Last seen 09/20/22 recuperating well from hospitalization for heart failure. Recommended to follow up in 4 months.   Of note, she is on Eliquis 2.'5mg'$  BID due to atrial fibrillation.   CHA2DS2-VASc Score = 7   This indicates a 11.2% annual risk of stroke. The patient's score is based upon: CHF History: 1 HTN History: 1 Diabetes History: 1 Stroke History: 0 Vascular Disease History: 1 Age Score: 2 Gender Score: 1  Platelet count: 460 (09/01/22) and 379 (08/31/22) Creatinine clearance: 30 mL/min (adjusted for weight) based on labs 09/01/22.  Will route to pharmacy for input prior to providing recommendations to requesting office.   Loel Dubonnet, NP 09/22/2022, 10:48 AM

## 2022-09-23 NOTE — Telephone Encounter (Signed)
   Patient Name: Heather Olsen  DOB: 1930-12-18 MRN: [id number]  Primary Cardiologist: Minus Breeding, MD  Chart reviewed as part of pre-operative protocol coverage. Given past medical history and time since last visit, based on ACC/AHA guidelines, Joyous Crenshaw is at acceptable risk for the planned procedure without further cardiovascular testing.   Per pharmacy team and office protocol, may hold Eliquis 1-2 days prior to planned procedure.   The patient was advised that if she develops new symptoms prior to surgery to contact our office to arrange for a follow-up visit, and she verbalized understanding.  I will route this recommendation to the requesting party via Epic fax function and remove from pre-op pool.  Please call with questions.  Loel Dubonnet, NP 09/23/2022, 10:47 AM

## 2022-09-26 DIAGNOSIS — R2689 Other abnormalities of gait and mobility: Secondary | ICD-10-CM | POA: Diagnosis not present

## 2022-09-26 DIAGNOSIS — N390 Urinary tract infection, site not specified: Secondary | ICD-10-CM | POA: Diagnosis not present

## 2022-09-26 DIAGNOSIS — I5032 Chronic diastolic (congestive) heart failure: Secondary | ICD-10-CM | POA: Diagnosis not present

## 2022-09-26 DIAGNOSIS — R0602 Shortness of breath: Secondary | ICD-10-CM | POA: Diagnosis not present

## 2022-09-26 DIAGNOSIS — K5792 Diverticulitis of intestine, part unspecified, without perforation or abscess without bleeding: Secondary | ICD-10-CM | POA: Diagnosis not present

## 2022-09-26 DIAGNOSIS — R278 Other lack of coordination: Secondary | ICD-10-CM | POA: Diagnosis not present

## 2022-09-26 DIAGNOSIS — L304 Erythema intertrigo: Secondary | ICD-10-CM | POA: Diagnosis not present

## 2022-09-26 DIAGNOSIS — R2681 Unsteadiness on feet: Secondary | ICD-10-CM | POA: Diagnosis not present

## 2022-09-26 DIAGNOSIS — M6281 Muscle weakness (generalized): Secondary | ICD-10-CM | POA: Diagnosis not present

## 2022-09-28 DIAGNOSIS — R2681 Unsteadiness on feet: Secondary | ICD-10-CM | POA: Diagnosis not present

## 2022-09-28 DIAGNOSIS — R1314 Dysphagia, pharyngoesophageal phase: Secondary | ICD-10-CM | POA: Diagnosis not present

## 2022-09-28 DIAGNOSIS — K5792 Diverticulitis of intestine, part unspecified, without perforation or abscess without bleeding: Secondary | ICD-10-CM | POA: Diagnosis not present

## 2022-09-28 DIAGNOSIS — R2689 Other abnormalities of gait and mobility: Secondary | ICD-10-CM | POA: Diagnosis not present

## 2022-09-28 DIAGNOSIS — R278 Other lack of coordination: Secondary | ICD-10-CM | POA: Diagnosis not present

## 2022-09-28 DIAGNOSIS — M6281 Muscle weakness (generalized): Secondary | ICD-10-CM | POA: Diagnosis not present

## 2022-09-28 DIAGNOSIS — R0602 Shortness of breath: Secondary | ICD-10-CM | POA: Diagnosis not present

## 2022-09-29 DIAGNOSIS — R0602 Shortness of breath: Secondary | ICD-10-CM | POA: Diagnosis not present

## 2022-09-29 DIAGNOSIS — M6281 Muscle weakness (generalized): Secondary | ICD-10-CM | POA: Diagnosis not present

## 2022-09-29 DIAGNOSIS — R2681 Unsteadiness on feet: Secondary | ICD-10-CM | POA: Diagnosis not present

## 2022-09-29 DIAGNOSIS — R278 Other lack of coordination: Secondary | ICD-10-CM | POA: Diagnosis not present

## 2022-09-29 DIAGNOSIS — R1314 Dysphagia, pharyngoesophageal phase: Secondary | ICD-10-CM | POA: Diagnosis not present

## 2022-09-29 DIAGNOSIS — K5792 Diverticulitis of intestine, part unspecified, without perforation or abscess without bleeding: Secondary | ICD-10-CM | POA: Diagnosis not present

## 2022-10-03 DIAGNOSIS — H524 Presbyopia: Secondary | ICD-10-CM | POA: Diagnosis not present

## 2022-10-03 DIAGNOSIS — H5213 Myopia, bilateral: Secondary | ICD-10-CM | POA: Diagnosis not present

## 2022-10-03 DIAGNOSIS — Z961 Presence of intraocular lens: Secondary | ICD-10-CM | POA: Diagnosis not present

## 2022-10-03 DIAGNOSIS — H04123 Dry eye syndrome of bilateral lacrimal glands: Secondary | ICD-10-CM | POA: Diagnosis not present

## 2022-10-04 DIAGNOSIS — R0602 Shortness of breath: Secondary | ICD-10-CM | POA: Diagnosis not present

## 2022-10-04 DIAGNOSIS — Z23 Encounter for immunization: Secondary | ICD-10-CM | POA: Diagnosis not present

## 2022-10-04 DIAGNOSIS — R2681 Unsteadiness on feet: Secondary | ICD-10-CM | POA: Diagnosis not present

## 2022-10-04 DIAGNOSIS — M6281 Muscle weakness (generalized): Secondary | ICD-10-CM | POA: Diagnosis not present

## 2022-10-04 DIAGNOSIS — R278 Other lack of coordination: Secondary | ICD-10-CM | POA: Diagnosis not present

## 2022-10-04 DIAGNOSIS — K5792 Diverticulitis of intestine, part unspecified, without perforation or abscess without bleeding: Secondary | ICD-10-CM | POA: Diagnosis not present

## 2022-10-04 DIAGNOSIS — R1314 Dysphagia, pharyngoesophageal phase: Secondary | ICD-10-CM | POA: Diagnosis not present

## 2022-10-06 DIAGNOSIS — R0602 Shortness of breath: Secondary | ICD-10-CM | POA: Diagnosis not present

## 2022-10-06 DIAGNOSIS — R278 Other lack of coordination: Secondary | ICD-10-CM | POA: Diagnosis not present

## 2022-10-06 DIAGNOSIS — K5792 Diverticulitis of intestine, part unspecified, without perforation or abscess without bleeding: Secondary | ICD-10-CM | POA: Diagnosis not present

## 2022-10-06 DIAGNOSIS — M6281 Muscle weakness (generalized): Secondary | ICD-10-CM | POA: Diagnosis not present

## 2022-10-06 DIAGNOSIS — R2681 Unsteadiness on feet: Secondary | ICD-10-CM | POA: Diagnosis not present

## 2022-10-06 DIAGNOSIS — R1314 Dysphagia, pharyngoesophageal phase: Secondary | ICD-10-CM | POA: Diagnosis not present

## 2022-10-11 DIAGNOSIS — R1314 Dysphagia, pharyngoesophageal phase: Secondary | ICD-10-CM | POA: Diagnosis not present

## 2022-10-11 DIAGNOSIS — R278 Other lack of coordination: Secondary | ICD-10-CM | POA: Diagnosis not present

## 2022-10-11 DIAGNOSIS — M6281 Muscle weakness (generalized): Secondary | ICD-10-CM | POA: Diagnosis not present

## 2022-10-11 DIAGNOSIS — R0602 Shortness of breath: Secondary | ICD-10-CM | POA: Diagnosis not present

## 2022-10-11 DIAGNOSIS — R2681 Unsteadiness on feet: Secondary | ICD-10-CM | POA: Diagnosis not present

## 2022-10-11 DIAGNOSIS — K5792 Diverticulitis of intestine, part unspecified, without perforation or abscess without bleeding: Secondary | ICD-10-CM | POA: Diagnosis not present

## 2022-10-13 DIAGNOSIS — K5792 Diverticulitis of intestine, part unspecified, without perforation or abscess without bleeding: Secondary | ICD-10-CM | POA: Diagnosis not present

## 2022-10-13 DIAGNOSIS — R2681 Unsteadiness on feet: Secondary | ICD-10-CM | POA: Diagnosis not present

## 2022-10-13 DIAGNOSIS — R278 Other lack of coordination: Secondary | ICD-10-CM | POA: Diagnosis not present

## 2022-10-13 DIAGNOSIS — R1314 Dysphagia, pharyngoesophageal phase: Secondary | ICD-10-CM | POA: Diagnosis not present

## 2022-10-13 DIAGNOSIS — M6281 Muscle weakness (generalized): Secondary | ICD-10-CM | POA: Diagnosis not present

## 2022-10-13 DIAGNOSIS — R0602 Shortness of breath: Secondary | ICD-10-CM | POA: Diagnosis not present

## 2022-10-18 DIAGNOSIS — E538 Deficiency of other specified B group vitamins: Secondary | ICD-10-CM | POA: Diagnosis not present

## 2022-10-24 DIAGNOSIS — M47816 Spondylosis without myelopathy or radiculopathy, lumbar region: Secondary | ICD-10-CM | POA: Diagnosis not present

## 2022-10-26 DIAGNOSIS — R0602 Shortness of breath: Secondary | ICD-10-CM | POA: Diagnosis not present

## 2022-10-26 DIAGNOSIS — R2681 Unsteadiness on feet: Secondary | ICD-10-CM | POA: Diagnosis not present

## 2022-10-26 DIAGNOSIS — M6281 Muscle weakness (generalized): Secondary | ICD-10-CM | POA: Diagnosis not present

## 2022-10-26 DIAGNOSIS — K5792 Diverticulitis of intestine, part unspecified, without perforation or abscess without bleeding: Secondary | ICD-10-CM | POA: Diagnosis not present

## 2022-10-26 DIAGNOSIS — R1314 Dysphagia, pharyngoesophageal phase: Secondary | ICD-10-CM | POA: Diagnosis not present

## 2022-10-26 DIAGNOSIS — R278 Other lack of coordination: Secondary | ICD-10-CM | POA: Diagnosis not present

## 2022-10-27 DIAGNOSIS — E119 Type 2 diabetes mellitus without complications: Secondary | ICD-10-CM | POA: Diagnosis not present

## 2022-10-27 DIAGNOSIS — E7800 Pure hypercholesterolemia, unspecified: Secondary | ICD-10-CM | POA: Diagnosis not present

## 2022-10-27 DIAGNOSIS — I10 Essential (primary) hypertension: Secondary | ICD-10-CM | POA: Diagnosis not present

## 2022-10-27 DIAGNOSIS — I2510 Atherosclerotic heart disease of native coronary artery without angina pectoris: Secondary | ICD-10-CM | POA: Diagnosis not present

## 2022-11-06 DIAGNOSIS — N390 Urinary tract infection, site not specified: Secondary | ICD-10-CM | POA: Diagnosis not present

## 2022-11-10 DIAGNOSIS — M47816 Spondylosis without myelopathy or radiculopathy, lumbar region: Secondary | ICD-10-CM | POA: Diagnosis not present

## 2022-11-10 DIAGNOSIS — M25561 Pain in right knee: Secondary | ICD-10-CM | POA: Diagnosis not present

## 2022-11-11 DIAGNOSIS — N3020 Other chronic cystitis without hematuria: Secondary | ICD-10-CM | POA: Diagnosis not present

## 2022-11-30 DIAGNOSIS — K5792 Diverticulitis of intestine, part unspecified, without perforation or abscess without bleeding: Secondary | ICD-10-CM | POA: Diagnosis not present

## 2022-11-30 DIAGNOSIS — M542 Cervicalgia: Secondary | ICD-10-CM | POA: Diagnosis not present

## 2022-12-14 DIAGNOSIS — R351 Nocturia: Secondary | ICD-10-CM | POA: Diagnosis not present

## 2022-12-14 DIAGNOSIS — N3946 Mixed incontinence: Secondary | ICD-10-CM | POA: Diagnosis not present

## 2022-12-14 DIAGNOSIS — N3020 Other chronic cystitis without hematuria: Secondary | ICD-10-CM | POA: Diagnosis not present

## 2022-12-26 ENCOUNTER — Ambulatory Visit (INDEPENDENT_AMBULATORY_CARE_PROVIDER_SITE_OTHER): Payer: Medicare Other | Admitting: Podiatry

## 2022-12-26 ENCOUNTER — Encounter: Payer: Self-pay | Admitting: Podiatry

## 2022-12-26 ENCOUNTER — Ambulatory Visit (INDEPENDENT_AMBULATORY_CARE_PROVIDER_SITE_OTHER): Payer: Medicare Other

## 2022-12-26 DIAGNOSIS — L84 Corns and callosities: Secondary | ICD-10-CM | POA: Diagnosis not present

## 2022-12-26 DIAGNOSIS — M7742 Metatarsalgia, left foot: Secondary | ICD-10-CM

## 2022-12-26 DIAGNOSIS — M2042 Other hammer toe(s) (acquired), left foot: Secondary | ICD-10-CM | POA: Diagnosis not present

## 2022-12-26 DIAGNOSIS — D689 Coagulation defect, unspecified: Secondary | ICD-10-CM

## 2022-12-26 DIAGNOSIS — E1140 Type 2 diabetes mellitus with diabetic neuropathy, unspecified: Secondary | ICD-10-CM

## 2022-12-26 DIAGNOSIS — E1149 Type 2 diabetes mellitus with other diabetic neurological complication: Secondary | ICD-10-CM

## 2022-12-27 NOTE — Progress Notes (Signed)
Subjective:   Patient ID: Heather Olsen, female   DOB: 89 y.o.   MRN: [id number]   HPI Patient presents with caregiver with a painful second digit left distal that has made it hard to wear shoe gear comfortably with patient being on Coumadin and also does have diabetic issues and tries to take care of.  Patient states the toes been very sore and patient does not smoke tries to be active   Review of Systems  All other systems reviewed and are negative.       Objective:  Physical Exam Vitals and nursing note reviewed.  Constitutional:      Appearance: She is well-developed.  Pulmonary:     Effort: Pulmonary effort is normal.  Musculoskeletal:        General: Normal range of motion.  Skin:    General: Skin is warm.  Neurological:     Mental Status: She is alert.     Neurovascular status was found to be intact muscle strength is found to be within normal limits for her age with diminished range of motion noted subtalar midtarsal joint.  Patient is found to have elongated second digit left over right with distal keratotic lesion noted that is painful when pressed and make shoe gear difficult.  Patient has good digital perfusion well-oriented x 3     Assessment:  At risk patient with digital deformity digit to left and structural changes with pain along with hammertoe deformity and keratotic lesion formation     Plan:  H&P reviewed condition and at this point debrided distal lesion applied cushioning to the toe discussed possibility for surgery in future but would like to hold off and avoid this and shoe gear modifications.  Reappoint to recheck all questions answered  X-ray left indicating elongated second digit with distal pressure occurring against it and distal deformity of the toe itself with moderate osteoporosis also noted

## 2022-12-28 DIAGNOSIS — M5416 Radiculopathy, lumbar region: Secondary | ICD-10-CM | POA: Diagnosis not present

## 2022-12-28 DIAGNOSIS — M47812 Spondylosis without myelopathy or radiculopathy, cervical region: Secondary | ICD-10-CM | POA: Diagnosis not present

## 2023-01-02 DIAGNOSIS — E119 Type 2 diabetes mellitus without complications: Secondary | ICD-10-CM | POA: Diagnosis not present

## 2023-01-02 DIAGNOSIS — I10 Essential (primary) hypertension: Secondary | ICD-10-CM | POA: Diagnosis not present

## 2023-01-02 DIAGNOSIS — I350 Nonrheumatic aortic (valve) stenosis: Secondary | ICD-10-CM | POA: Diagnosis not present

## 2023-01-05 DIAGNOSIS — R112 Nausea with vomiting, unspecified: Secondary | ICD-10-CM | POA: Diagnosis not present

## 2023-01-05 DIAGNOSIS — H8110 Benign paroxysmal vertigo, unspecified ear: Secondary | ICD-10-CM | POA: Diagnosis not present

## 2023-01-10 DIAGNOSIS — Z79891 Long term (current) use of opiate analgesic: Secondary | ICD-10-CM | POA: Diagnosis not present

## 2023-01-10 DIAGNOSIS — Z79899 Other long term (current) drug therapy: Secondary | ICD-10-CM | POA: Diagnosis not present

## 2023-01-10 DIAGNOSIS — K2950 Unspecified chronic gastritis without bleeding: Secondary | ICD-10-CM | POA: Diagnosis not present

## 2023-01-10 DIAGNOSIS — Z8679 Personal history of other diseases of the circulatory system: Secondary | ICD-10-CM | POA: Diagnosis not present

## 2023-01-10 DIAGNOSIS — F419 Anxiety disorder, unspecified: Secondary | ICD-10-CM | POA: Diagnosis not present

## 2023-01-10 DIAGNOSIS — K2289 Other specified disease of esophagus: Secondary | ICD-10-CM | POA: Diagnosis not present

## 2023-01-10 DIAGNOSIS — K2970 Gastritis, unspecified, without bleeding: Secondary | ICD-10-CM | POA: Diagnosis not present

## 2023-01-10 DIAGNOSIS — Z7901 Long term (current) use of anticoagulants: Secondary | ICD-10-CM | POA: Diagnosis not present

## 2023-01-10 DIAGNOSIS — R112 Nausea with vomiting, unspecified: Secondary | ICD-10-CM | POA: Diagnosis not present

## 2023-01-10 DIAGNOSIS — Z95828 Presence of other vascular implants and grafts: Secondary | ICD-10-CM | POA: Diagnosis not present

## 2023-01-10 DIAGNOSIS — I5032 Chronic diastolic (congestive) heart failure: Secondary | ICD-10-CM | POA: Diagnosis not present

## 2023-01-10 DIAGNOSIS — M419 Scoliosis, unspecified: Secondary | ICD-10-CM | POA: Diagnosis not present

## 2023-01-10 DIAGNOSIS — I110 Hypertensive heart disease with heart failure: Secondary | ICD-10-CM | POA: Diagnosis not present

## 2023-01-10 DIAGNOSIS — R1314 Dysphagia, pharyngoesophageal phase: Secondary | ICD-10-CM | POA: Diagnosis not present

## 2023-01-10 DIAGNOSIS — D649 Anemia, unspecified: Secondary | ICD-10-CM | POA: Diagnosis not present

## 2023-01-10 DIAGNOSIS — E785 Hyperlipidemia, unspecified: Secondary | ICD-10-CM | POA: Diagnosis not present

## 2023-01-10 DIAGNOSIS — K3189 Other diseases of stomach and duodenum: Secondary | ICD-10-CM | POA: Diagnosis not present

## 2023-01-10 DIAGNOSIS — K317 Polyp of stomach and duodenum: Secondary | ICD-10-CM | POA: Diagnosis not present

## 2023-01-10 DIAGNOSIS — I4891 Unspecified atrial fibrillation: Secondary | ICD-10-CM | POA: Diagnosis not present

## 2023-01-10 DIAGNOSIS — K5730 Diverticulosis of large intestine without perforation or abscess without bleeding: Secondary | ICD-10-CM | POA: Diagnosis not present

## 2023-01-10 DIAGNOSIS — K222 Esophageal obstruction: Secondary | ICD-10-CM | POA: Diagnosis not present

## 2023-01-10 DIAGNOSIS — E119 Type 2 diabetes mellitus without complications: Secondary | ICD-10-CM | POA: Diagnosis not present

## 2023-01-10 DIAGNOSIS — Z9889 Other specified postprocedural states: Secondary | ICD-10-CM | POA: Diagnosis not present

## 2023-01-10 DIAGNOSIS — I2510 Atherosclerotic heart disease of native coronary artery without angina pectoris: Secondary | ICD-10-CM | POA: Diagnosis not present

## 2023-01-10 DIAGNOSIS — Z7984 Long term (current) use of oral hypoglycemic drugs: Secondary | ICD-10-CM | POA: Diagnosis not present

## 2023-01-10 DIAGNOSIS — G8929 Other chronic pain: Secondary | ICD-10-CM | POA: Diagnosis not present

## 2023-01-10 DIAGNOSIS — K5792 Diverticulitis of intestine, part unspecified, without perforation or abscess without bleeding: Secondary | ICD-10-CM | POA: Diagnosis not present

## 2023-01-10 DIAGNOSIS — M25569 Pain in unspecified knee: Secondary | ICD-10-CM | POA: Diagnosis not present

## 2023-01-12 DIAGNOSIS — M47812 Spondylosis without myelopathy or radiculopathy, cervical region: Secondary | ICD-10-CM | POA: Diagnosis not present

## 2023-01-12 DIAGNOSIS — M6281 Muscle weakness (generalized): Secondary | ICD-10-CM | POA: Diagnosis not present

## 2023-01-12 DIAGNOSIS — R293 Abnormal posture: Secondary | ICD-10-CM | POA: Diagnosis not present

## 2023-01-14 DIAGNOSIS — H8113 Benign paroxysmal vertigo, bilateral: Secondary | ICD-10-CM | POA: Diagnosis not present

## 2023-01-14 DIAGNOSIS — N390 Urinary tract infection, site not specified: Secondary | ICD-10-CM | POA: Diagnosis not present

## 2023-01-16 DIAGNOSIS — R293 Abnormal posture: Secondary | ICD-10-CM | POA: Diagnosis not present

## 2023-01-16 DIAGNOSIS — M6281 Muscle weakness (generalized): Secondary | ICD-10-CM | POA: Diagnosis not present

## 2023-01-16 DIAGNOSIS — M47812 Spondylosis without myelopathy or radiculopathy, cervical region: Secondary | ICD-10-CM | POA: Diagnosis not present
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
## 2023-01-19 DIAGNOSIS — M6281 Muscle weakness (generalized): Secondary | ICD-10-CM | POA: Diagnosis not present

## 2023-01-19 DIAGNOSIS — R293 Abnormal posture: Secondary | ICD-10-CM | POA: Diagnosis not present

## 2023-01-19 DIAGNOSIS — M47812 Spondylosis without myelopathy or radiculopathy, cervical region: Secondary | ICD-10-CM | POA: Diagnosis not present

## 2023-01-20 ENCOUNTER — Encounter: Payer: Self-pay | Admitting: General Practice

## 2023-01-20 ENCOUNTER — Ambulatory Visit: Payer: Medicare Other | Attending: General Practice | Admitting: General Practice

## 2023-01-20 VITALS — BP 114/62 | HR 60 | Ht 64.0 in | Wt 112.8 lb

## 2023-01-20 DIAGNOSIS — I480 Paroxysmal atrial fibrillation: Secondary | ICD-10-CM | POA: Diagnosis not present

## 2023-01-20 DIAGNOSIS — I5032 Chronic diastolic (congestive) heart failure: Secondary | ICD-10-CM | POA: Diagnosis not present

## 2023-01-20 DIAGNOSIS — I10 Essential (primary) hypertension: Secondary | ICD-10-CM | POA: Insufficient documentation

## 2023-01-20 NOTE — Patient Instructions (Signed)
Medication Instructions:  The current medical regimen is effective;  continue present plan and medications as directed. Please refer to the Current Medication list given to you today.  *If you need a refill on your cardiac medications before your next appointment, please call your pharmacy*  Lab Work: NONE If you have labs (blood work) drawn today and your tests are completely normal, you will receive your results only by: [place] (if you have MyChart) OR  A paper copy in the mail If you have any lab test that is abnormal or we need to change your treatment, we will call you to review the results.  Testing/Procedures: NONE  Other Instructions MAINTAIN PHYSICAL ACTIVITY-AS TOLERATED  PLEASE READ AND FOLLOW ATTACHED  SALTY   Follow-Up: At [facility], you and your health needs are our priority.  As part of our continuing mission to provide you with exceptional heart care, we have created designated Provider Care Teams.  These Care Teams include your primary Cardiologist (physician) and Advanced Practice Providers (APPs -  Physician Assistants and Nurse Practitioners) who all work together to provide you with the care you need, when you need it.  Your next appointment:   6 month(s)  Provider:   Minus Breeding, MD

## 2023-01-23 DIAGNOSIS — M5416 Radiculopathy, lumbar region: Secondary | ICD-10-CM | POA: Diagnosis not present

## 2023-01-24 DIAGNOSIS — M47812 Spondylosis without myelopathy or radiculopathy, cervical region: Secondary | ICD-10-CM | POA: Diagnosis not present

## 2023-01-24 DIAGNOSIS — M6281 Muscle weakness (generalized): Secondary | ICD-10-CM | POA: Diagnosis not present

## 2023-01-24 DIAGNOSIS — R293 Abnormal posture: Secondary | ICD-10-CM | POA: Diagnosis not present

## 2023-01-26 DIAGNOSIS — N390 Urinary tract infection, site not specified: Secondary | ICD-10-CM | POA: Diagnosis not present

## 2023-01-26 DIAGNOSIS — E538 Deficiency of other specified B group vitamins: Secondary | ICD-10-CM | POA: Diagnosis not present

## 2023-01-26 DIAGNOSIS — M47812 Spondylosis without myelopathy or radiculopathy, cervical region: Secondary | ICD-10-CM | POA: Diagnosis not present

## 2023-01-26 DIAGNOSIS — M6281 Muscle weakness (generalized): Secondary | ICD-10-CM | POA: Diagnosis not present

## 2023-01-26 DIAGNOSIS — R293 Abnormal posture: Secondary | ICD-10-CM | POA: Diagnosis not present

## 2023-01-31 DIAGNOSIS — M6281 Muscle weakness (generalized): Secondary | ICD-10-CM | POA: Diagnosis not present

## 2023-01-31 DIAGNOSIS — R293 Abnormal posture: Secondary | ICD-10-CM | POA: Diagnosis not present

## 2023-01-31 DIAGNOSIS — M47812 Spondylosis without myelopathy or radiculopathy, cervical region: Secondary | ICD-10-CM | POA: Diagnosis not present

## 2023-02-13 DIAGNOSIS — M47812 Spondylosis without myelopathy or radiculopathy, cervical region: Secondary | ICD-10-CM | POA: Diagnosis not present

## 2023-02-13 DIAGNOSIS — M6281 Muscle weakness (generalized): Secondary | ICD-10-CM | POA: Diagnosis not present

## 2023-02-13 DIAGNOSIS — R293 Abnormal posture: Secondary | ICD-10-CM | POA: Diagnosis not present

## 2023-02-16 DIAGNOSIS — M6281 Muscle weakness (generalized): Secondary | ICD-10-CM | POA: Diagnosis not present

## 2023-02-16 DIAGNOSIS — M47812 Spondylosis without myelopathy or radiculopathy, cervical region: Secondary | ICD-10-CM | POA: Diagnosis not present

## 2023-02-16 DIAGNOSIS — R293 Abnormal posture: Secondary | ICD-10-CM | POA: Diagnosis not present

## 2023-02-20 DIAGNOSIS — M6281 Muscle weakness (generalized): Secondary | ICD-10-CM | POA: Diagnosis not present

## 2023-02-20 DIAGNOSIS — R293 Abnormal posture: Secondary | ICD-10-CM | POA: Diagnosis not present

## 2023-02-20 DIAGNOSIS — M47812 Spondylosis without myelopathy or radiculopathy, cervical region: Secondary | ICD-10-CM | POA: Diagnosis not present

## 2023-02-23 DIAGNOSIS — M47812 Spondylosis without myelopathy or radiculopathy, cervical region: Secondary | ICD-10-CM | POA: Diagnosis not present

## 2023-02-23 DIAGNOSIS — M6281 Muscle weakness (generalized): Secondary | ICD-10-CM | POA: Diagnosis not present

## 2023-02-23 DIAGNOSIS — R293 Abnormal posture: Secondary | ICD-10-CM | POA: Diagnosis not present

## 2023-02-28 DIAGNOSIS — R7303 Prediabetes: Secondary | ICD-10-CM | POA: Diagnosis not present

## 2023-02-28 DIAGNOSIS — I10 Essential (primary) hypertension: Secondary | ICD-10-CM | POA: Diagnosis not present

## 2023-03-02 DIAGNOSIS — Z0000 Encounter for general adult medical examination without abnormal findings: Secondary | ICD-10-CM | POA: Diagnosis not present

## 2023-03-02 DIAGNOSIS — N1832 Chronic kidney disease, stage 3b: Secondary | ICD-10-CM | POA: Diagnosis not present

## 2023-03-02 DIAGNOSIS — Z23 Encounter for immunization: Secondary | ICD-10-CM | POA: Diagnosis not present

## 2023-03-02 DIAGNOSIS — R293 Abnormal posture: Secondary | ICD-10-CM | POA: Diagnosis not present

## 2023-03-02 DIAGNOSIS — M810 Age-related osteoporosis without current pathological fracture: Secondary | ICD-10-CM | POA: Diagnosis not present

## 2023-03-02 DIAGNOSIS — H8110 Benign paroxysmal vertigo, unspecified ear: Secondary | ICD-10-CM | POA: Diagnosis not present

## 2023-03-02 DIAGNOSIS — F339 Major depressive disorder, recurrent, unspecified: Secondary | ICD-10-CM | POA: Diagnosis not present

## 2023-03-02 DIAGNOSIS — I10 Essential (primary) hypertension: Secondary | ICD-10-CM | POA: Diagnosis not present

## 2023-03-02 DIAGNOSIS — M47812 Spondylosis without myelopathy or radiculopathy, cervical region: Secondary | ICD-10-CM | POA: Diagnosis not present

## 2023-03-02 DIAGNOSIS — E1122 Type 2 diabetes mellitus with diabetic chronic kidney disease: Secondary | ICD-10-CM | POA: Diagnosis not present

## 2023-03-02 DIAGNOSIS — D6859 Other primary thrombophilia: Secondary | ICD-10-CM | POA: Diagnosis not present

## 2023-03-02 DIAGNOSIS — I4891 Unspecified atrial fibrillation: Secondary | ICD-10-CM | POA: Diagnosis not present

## 2023-03-02 DIAGNOSIS — I2510 Atherosclerotic heart disease of native coronary artery without angina pectoris: Secondary | ICD-10-CM | POA: Diagnosis not present

## 2023-03-02 DIAGNOSIS — M6281 Muscle weakness (generalized): Secondary | ICD-10-CM | POA: Diagnosis not present

## 2023-03-02 DIAGNOSIS — R351 Nocturia: Secondary | ICD-10-CM | POA: Diagnosis not present

## 2023-03-02 DIAGNOSIS — I5032 Chronic diastolic (congestive) heart failure: Secondary | ICD-10-CM | POA: Diagnosis not present

## 2023-03-06 DIAGNOSIS — R293 Abnormal posture: Secondary | ICD-10-CM | POA: Diagnosis not present

## 2023-03-06 DIAGNOSIS — M6281 Muscle weakness (generalized): Secondary | ICD-10-CM | POA: Diagnosis not present

## 2023-03-06 DIAGNOSIS — M47812 Spondylosis without myelopathy or radiculopathy, cervical region: Secondary | ICD-10-CM | POA: Diagnosis not present

## 2023-03-07 DIAGNOSIS — M5416 Radiculopathy, lumbar region: Secondary | ICD-10-CM | POA: Diagnosis not present

## 2023-03-07 DIAGNOSIS — M25562 Pain in left knee: Secondary | ICD-10-CM | POA: Diagnosis not present

## 2023-03-07 DIAGNOSIS — H903 Sensorineural hearing loss, bilateral: Secondary | ICD-10-CM | POA: Diagnosis not present

## 2023-03-09 DIAGNOSIS — R293 Abnormal posture: Secondary | ICD-10-CM | POA: Diagnosis not present

## 2023-03-09 DIAGNOSIS — M47812 Spondylosis without myelopathy or radiculopathy, cervical region: Secondary | ICD-10-CM | POA: Diagnosis not present

## 2023-03-09 DIAGNOSIS — M6281 Muscle weakness (generalized): Secondary | ICD-10-CM | POA: Diagnosis not present

## 2023-03-14 DIAGNOSIS — M47812 Spondylosis without myelopathy or radiculopathy, cervical region: Secondary | ICD-10-CM | POA: Diagnosis not present

## 2023-03-14 DIAGNOSIS — M6281 Muscle weakness (generalized): Secondary | ICD-10-CM | POA: Diagnosis not present

## 2023-03-14 DIAGNOSIS — R293 Abnormal posture: Secondary | ICD-10-CM | POA: Diagnosis not present

## 2023-03-16 DIAGNOSIS — M6281 Muscle weakness (generalized): Secondary | ICD-10-CM | POA: Diagnosis not present

## 2023-03-16 DIAGNOSIS — M47812 Spondylosis without myelopathy or radiculopathy, cervical region: Secondary | ICD-10-CM | POA: Diagnosis not present

## 2023-03-16 DIAGNOSIS — R293 Abnormal posture: Secondary | ICD-10-CM | POA: Diagnosis not present

## 2023-03-23 DIAGNOSIS — M810 Age-related osteoporosis without current pathological fracture: Secondary | ICD-10-CM | POA: Diagnosis not present

## 2023-03-29 ENCOUNTER — Other Ambulatory Visit: Payer: Self-pay | Admitting: *Deleted

## 2023-03-29 DIAGNOSIS — K551 Chronic vascular disorders of intestine: Secondary | ICD-10-CM

## 2023-03-30 ENCOUNTER — Encounter: Payer: Self-pay | Admitting: Family Medicine

## 2023-03-30 ENCOUNTER — Ambulatory Visit (INDEPENDENT_AMBULATORY_CARE_PROVIDER_SITE_OTHER): Payer: Medicare Other

## 2023-03-30 ENCOUNTER — Ambulatory Visit (INDEPENDENT_AMBULATORY_CARE_PROVIDER_SITE_OTHER): Payer: Medicare Other | Admitting: Family Medicine

## 2023-03-30 VITALS — BP 116/70 | HR 61 | Ht 64.0 in | Wt 108.0 lb

## 2023-03-30 DIAGNOSIS — B070 Plantar wart: Secondary | ICD-10-CM

## 2023-03-30 DIAGNOSIS — M79671 Pain in right foot: Secondary | ICD-10-CM

## 2023-03-30 DIAGNOSIS — M19071 Primary osteoarthritis, right ankle and foot: Secondary | ICD-10-CM | POA: Diagnosis not present

## 2023-03-30 DIAGNOSIS — G5761 Lesion of plantar nerve, right lower limb: Secondary | ICD-10-CM | POA: Diagnosis not present

## 2023-03-30 DIAGNOSIS — H8110 Benign paroxysmal vertigo, unspecified ear: Secondary | ICD-10-CM | POA: Diagnosis not present

## 2023-03-30 NOTE — Patient Instructions (Signed)
Thank you for coming in today.   Try metatarsal pad on your foot.   Please get an Xray today before you leave   Recheck in 1 month.   If not improving we can try an injection

## 2023-03-30 NOTE — Progress Notes (Signed)
   Rubin Payor, PhD, LAT, ATC acting as a scribe for Heather Graham, MD.  Heather Olsen is a 89 y.o. female who presents to [facility] at [facility] today for R heel pain. Pt was previously seen by Dr. Denyse Amass on 03/22/22 for  L foot pain due to metatarsalgia.  Today, pt c/o R heel pain ongoing over the past couple of months. Pt locates pain to right heel and third toe. No known injury. Toe pain seems to occur randomly.  She describes a burning sensation into her toe.  In the heel, feels to palpable nodules. Pain in the heel with WB.   Aggravates: see above Treatments tried: none  Pertinent review of systems: No fevers or chills  Relevant historical information: Heart failure   Exam:  BP 116/70   Pulse 61   Ht 5\' 4"  (1.626 m)   Wt 108 lb (49 kg)   SpO2 98%   BMI 18.54 kg/m  General: Well Developed, well nourished, and in no acute distress.   MSK: Right foot: Breakdown of longitudinal and transverse arch present. Third toe claw toe deformity present. Plantar calcaneus 2 small firm papules or nodules consistent with plantar warts present.  Mildly tender to palpation. Tender palpation mildly at the third MTP. Metatarsal squeeze test does elicit pain. Normal foot and ankle motion.    Lab and Radiology Results  X-ray images right foot obtained today personally and independently interpreted Metallic surgical foreign body present in the distal first metatarsal presumably from bunion surgery No acute fractures.  No severe DJD present. Await formal radiology review     Assessment and Plan: 89 y.o. female with right foot pain.  She has pain in 2 locations. Pain at the third MTP is a numbness and tingling sensation at times.  This may be a Morton's neuroma.  Will try treated with metatarsal pads and recheck in a month.  Additionally she has 2 tiny plantar warts in the plantar heel.  Watchful waiting for now.  If needed consider removal by podiatry.  She would  like to see someone else than Dr. Charlsie Merles if needed in the future.  Recommend also trial Voltaren gel. PDMP not reviewed this encounter. Orders Placed This Encounter  Procedures   DG Foot Complete Right    Standing Status:   Future    Number of Occurrences:   1    Standing Expiration Date:   03/29/2024    Order Specific Question:   Reason for Exam (SYMPTOM  OR DIAGNOSIS REQUIRED)    Answer:   eval pain at metatarsal and 3rd DIP    Order Specific Question:   Preferred imaging location?    Answer:   [facility]   No orders of the defined types were placed in this encounter.    Discussed warning signs or symptoms. Please see discharge instructions. Patient expresses understanding.   The above documentation has been reviewed and is accurate and complete Heather Olsen, M.D.

## 2023-04-05 ENCOUNTER — Ambulatory Visit (HOSPITAL_COMMUNITY)
Admission: RE | Admit: 2023-04-05 | Discharge: 2023-04-05 | Disposition: A | Discharge status: Home / Self Care | Payer: Medicare Other | Source: Ambulatory Visit | Attending: Vascular Surgery | Admitting: Vascular Surgery

## 2023-04-05 ENCOUNTER — Other Ambulatory Visit: Payer: Self-pay

## 2023-04-05 ENCOUNTER — Ambulatory Visit (INDEPENDENT_AMBULATORY_CARE_PROVIDER_SITE_OTHER): Payer: Medicare Other | Admitting: Physician Assistant

## 2023-04-05 VITALS — BP 154/79 | HR 59 | Temp 97.9°F | Wt 107.0 lb

## 2023-04-05 DIAGNOSIS — K551 Chronic vascular disorders of intestine: Secondary | ICD-10-CM

## 2023-04-05 NOTE — Progress Notes (Signed)
Right foot x-ray shows history of surgery at around the big toe.  You have a little bit of arthritis generally in the foot.  No fractures are present.
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
## 2023-04-14 DIAGNOSIS — H8110 Benign paroxysmal vertigo, unspecified ear: Secondary | ICD-10-CM | POA: Diagnosis not present

## 2023-04-17 ENCOUNTER — Other Ambulatory Visit: Payer: Self-pay

## 2023-04-17 DIAGNOSIS — K551 Chronic vascular disorders of intestine: Secondary | ICD-10-CM

## 2023-04-27 DIAGNOSIS — E538 Deficiency of other specified B group vitamins: Secondary | ICD-10-CM | POA: Diagnosis not present

## 2023-05-02 DIAGNOSIS — M47812 Spondylosis without myelopathy or radiculopathy, cervical region: Secondary | ICD-10-CM | POA: Diagnosis not present

## 2023-05-03 ENCOUNTER — Emergency Department (HOSPITAL_COMMUNITY)
Admission: EM | Admit: 2023-05-03 | Discharge: 2023-05-03 | Disposition: A | Discharge status: Home / Self Care | Payer: Medicare Other | Attending: Emergency Medicine | Admitting: Emergency Medicine

## 2023-05-03 ENCOUNTER — Emergency Department (HOSPITAL_COMMUNITY): Payer: Medicare Other

## 2023-05-03 ENCOUNTER — Encounter (HOSPITAL_COMMUNITY): Payer: Self-pay

## 2023-05-03 ENCOUNTER — Other Ambulatory Visit: Payer: Self-pay

## 2023-05-03 DIAGNOSIS — R1032 Left lower quadrant pain: Secondary | ICD-10-CM | POA: Diagnosis not present

## 2023-05-03 DIAGNOSIS — K5730 Diverticulosis of large intestine without perforation or abscess without bleeding: Secondary | ICD-10-CM | POA: Diagnosis not present

## 2023-05-03 DIAGNOSIS — I110 Hypertensive heart disease with heart failure: Secondary | ICD-10-CM | POA: Insufficient documentation

## 2023-05-03 DIAGNOSIS — Z7901 Long term (current) use of anticoagulants: Secondary | ICD-10-CM | POA: Insufficient documentation

## 2023-05-03 DIAGNOSIS — N3289 Other specified disorders of bladder: Secondary | ICD-10-CM | POA: Diagnosis not present

## 2023-05-03 DIAGNOSIS — Z7984 Long term (current) use of oral hypoglycemic drugs: Secondary | ICD-10-CM | POA: Insufficient documentation

## 2023-05-03 DIAGNOSIS — E119 Type 2 diabetes mellitus without complications: Secondary | ICD-10-CM | POA: Diagnosis not present

## 2023-05-03 DIAGNOSIS — R739 Hyperglycemia, unspecified: Secondary | ICD-10-CM | POA: Diagnosis not present

## 2023-05-03 DIAGNOSIS — I509 Heart failure, unspecified: Secondary | ICD-10-CM | POA: Diagnosis not present

## 2023-05-03 DIAGNOSIS — I2510 Atherosclerotic heart disease of native coronary artery without angina pectoris: Secondary | ICD-10-CM | POA: Insufficient documentation

## 2023-05-03 DIAGNOSIS — R1084 Generalized abdominal pain: Secondary | ICD-10-CM | POA: Diagnosis not present

## 2023-05-03 DIAGNOSIS — R109 Unspecified abdominal pain: Secondary | ICD-10-CM | POA: Diagnosis not present

## 2023-05-03 LAB — COMPREHENSIVE METABOLIC PANEL
ALT: 20 U/L (ref 0–44)
AST: 25 U/L (ref 15–41)
Albumin: 3.8 g/dL (ref 3.5–5.0)
Alkaline Phosphatase: 93 U/L (ref 38–126)
Anion gap: 13 (ref 5–15)
BUN: 29 mg/dL — ABNORMAL HIGH (ref 8–23)
CO2: 25 mmol/L (ref 22–32)
Calcium: 9.3 mg/dL (ref 8.9–10.3)
Chloride: 93 mmol/L — ABNORMAL LOW (ref 98–111)
Creatinine, Ser: 0.93 mg/dL (ref 0.44–1.00)
GFR, Estimated: 59 mL/min — ABNORMAL LOW (ref >60)
Glucose, Bld: 217 mg/dL — ABNORMAL HIGH (ref 70–99)
Potassium: 4 mmol/L (ref 3.5–5.1)
Sodium: 131 mmol/L — ABNORMAL LOW (ref 135–145)
Total Bilirubin: 0.7 mg/dL (ref 0.3–1.2)
Total Protein: 7.5 g/dL (ref 6.5–8.1)

## 2023-05-03 LAB — URINALYSIS, ROUTINE W REFLEX MICROSCOPIC
Bilirubin Urine: NEGATIVE
Glucose, UA: NEGATIVE mg/dL
Hgb urine dipstick: NEGATIVE
Ketones, ur: NEGATIVE mg/dL
Leukocytes,Ua: NEGATIVE
Nitrite: NEGATIVE
Protein, ur: NEGATIVE mg/dL
Specific Gravity, Urine: 1.012 (ref 1.005–1.030)
pH: 6 (ref 5.0–8.0)

## 2023-05-03 LAB — CBC
HCT: 35.2 % — ABNORMAL LOW (ref 36.0–46.0)
Hemoglobin: 11.7 g/dL — ABNORMAL LOW (ref 12.0–15.0)
MCH: 35.6 pg — ABNORMAL HIGH (ref 26.0–34.0)
MCHC: 33.2 g/dL (ref 30.0–36.0)
MCV: 107 fL — ABNORMAL HIGH (ref 80.0–100.0)
Platelets: 383 10*3/uL (ref 150–400)
RBC: 3.29 MIL/uL — ABNORMAL LOW (ref 3.87–5.11)
RDW: 11.8 % (ref 11.5–15.5)
WBC: 8.7 10*3/uL (ref 4.0–10.5)
nRBC: 0 % (ref 0.0–0.2)

## 2023-05-03 LAB — LIPASE, BLOOD: Lipase: 30 U/L (ref 11–51)

## 2023-05-03 MED ORDER — SODIUM CHLORIDE 0.9 % IV SOLN: Freq: Once | INTRAVENOUS | Status: AC

## 2023-05-03 MED ORDER — HYDROCODONE-ACETAMINOPHEN 5-325 MG PO TABS
1.0000 | ORAL_TABLET | Freq: Once | ORAL | Status: AC
  Administered 2023-05-03: 1 via ORAL
  Filled 2023-05-03: qty 1

## 2023-05-03 MED ORDER — ONDANSETRON 4 MG PO TBDP
4.0000 mg | ORAL_TABLET | Freq: Once | ORAL | Status: AC
  Administered 2023-05-03: 4 mg via ORAL
  Filled 2023-05-03: qty 1

## 2023-05-03 MED ORDER — DICYCLOMINE HCL 20 MG PO TABS: 20.0000 mg | ORAL_TABLET | Freq: Two times a day (BID) | ORAL | 0 refills | Status: DC

## 2023-05-03 MED ORDER — IOHEXOL 300 MG/ML  SOLN
100.0000 mL | Freq: Once | INTRAMUSCULAR | Status: AC | PRN
  Administered 2023-05-03: 100 mL via INTRAVENOUS

## 2023-05-03 NOTE — Discharge Instructions (Signed)
Your CT scan was without any evidence of diverticulitis or other acute abnormal findings.  There is a very small kidney cyst which is small enough that it does not warrant any monitoring.  This was an incidental finding and would not be causing her symptoms.  Continue your home pain regimen however I would recommend taking Bentyl.  I have written you prescription in case you do not have sufficient Bentyl at home.  Make sure you are drinking plenty of water, continue taking fiber at home as needed for hard stool titrate to a soft stool.  I recommend following up with your primary care provider he may always return to emergency room for any new or concerning symptoms as we discussed

## 2023-05-03 NOTE — ED Provider Notes (Signed)
[place] EMERGENCY DEPARTMENT AT [facility] Provider Note   CSN: [id number] Arrival date & time: 05/03/23  1212     History  Chief Complaint  Patient presents with   Abdominal Pain    Heather Olsen is a 89 y.o. female with a history of CHF, coronary artery disease, diabetes mellitus, and scoliosis who presents to the ED today for abdominal pain.  Patient reports lower abdominal pain for the past 5 days, which has recently migrated to the left lower quadrant. She has been taking Tylenol and Tramadol without relief. No fever, N/V, changes to her bowel habits or difficulty urinating. Patient states that this pain feels similar to when she had diverticulitis.  No other complaints or concerns at this time.    Home Medications Prior to Admission medications   Medication Sig Start Date End Date Taking? Authorizing Provider  acetaminophen (TYLENOL) 500 MG tablet Take 500-1,000 mg by mouth every 6 (six) hours as needed for moderate pain.    [provider]  Cholecalciferol (VITAMIN D3) 2000 units capsule Take 2,000 Units by mouth daily.     [provider]  Cyanocobalamin 1000 MCG/ML LIQD Take by mouth.    [provider]  denosumab (PROLIA) 60 MG/ML SOSY injection Inject 60 mg into the skin every 6 (six) months. 07/25/19   [provider]  diclofenac Sodium (VOLTAREN) 1 % GEL Apply topically 4 (four) times daily.    [provider]  ELIQUIS 2.5 MG TABS tablet Take 2.5 mg by mouth 2 (two) times daily. 02/03/20   [provider]  furosemide (LASIX) 20 MG tablet Take 1 tablet (20 mg total) by mouth daily. 09/22/22   Rollene Rotunda, MD  ipratropium (ATROVENT) 0.06 % nasal spray Use 2 sprays in each nostril up to three times daily before meals for sinus drainage. Patient taking differently: Place 2 sprays into the nose daily. 09/30/16   Kimber Relic, MD  LINZESS 145 MCG CAPS capsule Take 145 mcg by mouth daily as needed (constipation).  07/29/22   [provider]  metFORMIN (GLUCOPHAGE-XR) 500 MG 24 hr tablet Take 1 tablet by mouth every evening. 02/01/22   [provider]  mirabegron ER (MYRBETRIQ) 50 MG TB24 tablet Take 1 tablet by mouth daily. 08/11/21   [provider]  MULTIPLE VITAMINS-MINERALS PO Take 1 tablet by mouth daily.     [provider]  nitrofurantoin, macrocrystal-monohydrate, (MACROBID) 100 MG capsule Take 100 mg by mouth every 12 (twelve) hours. 01/21/23   [provider]  nitroGLYCERIN (NITROSTAT) 0.4 MG SL tablet Place 1 tablet (0.4 mg total) under the tongue every 5 (five) minutes as needed for chest pain. Place one tablet under the tongue every 5 minutes as needed for chest pain. No more than 3 08/09/22   Rollene Rotunda, MD  pantoprazole (PROTONIX) 40 MG tablet Take 40 mg by mouth 2 (two) times daily. 12/18/19   [provider]  pindolol (VISKEN) 5 MG tablet Take 1/2 (one-half) tablet by mouth twice daily Patient taking differently: Take 2.5 mg by mouth 2 (two) times daily. 07/06/22   Rollene Rotunda, MD  Probiotic Product (PROBIOTIC DAILY PO) Take 1 capsule by mouth every evening.    [provider]  rOPINIRole (REQUIP) 0.5 MG tablet Take 0.5 mg by mouth at bedtime. 07/13/22   [provider]  rosuvastatin (CRESTOR) 10 MG tablet TAKE 1 TABLET BY MOUTH AT BEDTIME Patient taking differently: Take 10 mg by mouth daily. 11/23/20  Maeola Harman, MD  sertraline (ZOLOFT) 25 MG tablet Take 25 mg by mouth at bedtime. 08/10/22   [provider]  traMADol (ULTRAM) 50 MG tablet TAKE 1 TABLET BY MOUTH EVERY 4 HOURS Patient taking differently: Take 50 mg by mouth every 12 (twelve) hours as needed for moderate pain or severe pain (pain). 11/14/16   Reed, Tiffany L, DO      Allergies    Sulfasalazine, Topiramate, Ace inhibitors, Codeine, Ibandronic acid, Meloxicam, Prevacid [lansoprazole], Sulfa antibiotics, Verapamil, and Zolpidem     Review of Systems   Review of Systems  Gastrointestinal:  Positive for abdominal pain.  All other systems reviewed and are negative.   Physical Exam Updated Vital Signs BP (!) 142/73   Pulse 60   Temp 98.3 F (36.8 C) (Oral)   Resp 16   SpO2 98%  Physical Exam Vitals and nursing note reviewed.  Constitutional:      Appearance: Normal appearance.  HENT:     Head: Normocephalic and atraumatic.     Mouth/Throat:     Mouth: Mucous membranes are moist.  Eyes:     Conjunctiva/sclera: Conjunctivae normal.     Pupils: Pupils are equal, round, and reactive to light.  Cardiovascular:     Rate and Rhythm: Normal rate and regular rhythm.     Pulses: Normal pulses.     Heart sounds: Normal heart sounds.  Pulmonary:     Effort: Pulmonary effort is normal.     Breath sounds: Normal breath sounds.  Abdominal:     Palpations: Abdomen is soft.     Tenderness: There is abdominal tenderness.     Comments: LLQ tenderness  Skin:    General: Skin is warm and dry.     Findings: No rash.  Neurological:     General: No focal deficit present.     Mental Status: She is alert.  Psychiatric:        Mood and Affect: Mood normal.        Behavior: Behavior normal.     ED Results / Procedures / Treatments   Labs (all labs ordered are listed, but only abnormal results are displayed) Labs Reviewed  COMPREHENSIVE METABOLIC PANEL - Abnormal; Notable for the following components:      Result Value   Sodium 131 (*)    Chloride 93 (*)    Glucose, Bld 217 (*)    BUN 29 (*)    GFR, Estimated 59 (*)    All other components within normal limits  CBC - Abnormal; Notable for the following components:   RBC 3.29 (*)    Hemoglobin 11.7 (*)    HCT 35.2 (*)    MCV 107.0 (*)    MCH 35.6 (*)    All other components within normal limits  LIPASE, BLOOD  URINALYSIS, ROUTINE W REFLEX MICROSCOPIC    EKG None  Radiology No results found.  Procedures Procedures: not indicated.   Medications  Ordered in ED Medications  HYDROcodone-acetaminophen (NORCO/VICODIN) 5-325 MG per tablet 1 tablet (1 tablet Oral Given 05/03/23 1418)  ondansetron (ZOFRAN-ODT) disintegrating tablet 4 mg (4 mg Oral Given 05/03/23 1418)  0.9 %  sodium chloride infusion ( Intravenous New Bag/Given 05/03/23 1434)  iohexol (OMNIPAQUE) 300 MG/ML solution 100 mL (100 mLs Intravenous Contrast Given 05/03/23 1542)    ED Course/ Medical Decision Making/ A&P  Medical Decision Making Amount and/or Complexity of Data Reviewed Labs: ordered. Radiology: ordered.  Risk Prescription drug management.   This patient presents to the ED for concern of abdominal pain, this involves an extensive number of treatment options, and is a complaint that carries with it a high risk of complications and morbidity.   Differential diagnosis includes: Bowel obstruction, mesenteric ischemia, ileus, diverticulitis, pyelonephritis, cystitis.   Co morbidities that complicate the patient evaluation  Hypertension Hyperglycemia Coronary artery disease Diabetes mellitus History of diverticulitis CHF   Lab Tests:  I ordered and personally interpreted labs.  The pertinent results include:   CMP, lipase, and CBC within normal limits. U/A is unremarkable.   Imaging Studies ordered:  I ordered imaging studies including CT abdomen/pelvis with contrast  Imagining pending at time of shift change. Care transferred to [facility].   Problem List / ED Course / Critical interventions / Medication management  LLQ pain I ordered medications including: Norco for pain Zofran for nausea Normal saline  Reevaluation of the patient after these medicines showed that the patient improved I have reviewed the patients home medicines and have made adjustments as needed   Social Determinants of Health:  Housing Transportation Social connection   Disposition pending at time of shift change.        Final  Clinical Impression(s) / ED Diagnoses Final diagnoses:  None    Rx / DC Orders ED Discharge Orders     None         Maxwell Marion, PA-C 05/03/23 1549    Bethann Berkshire, MD 05/04/23 1046

## 2023-05-03 NOTE — ED Provider Notes (Signed)
Accepted handoff at shift change from [facility]. Please see prior provider note for more detail.   Briefly: Patient is 89 y.o.         Plan: CT and reassess      Physical Exam  BP (!) 144/97   Pulse (!) 55   Temp 98.2 F (36.8 C)   Resp 16   SpO2 92%   Physical Exam  Procedures  Procedures  ED Course / MDM    Medical Decision Making Amount and/or Complexity of Data Reviewed Labs: ordered. Radiology: ordered.  Risk Prescription drug management.   Patient on reassessment states that she feels much improved after a oral Norco.  She has no abdominal tenderness, per prior provider note patient's primary concern was that she is experiencing diverticular flare.  On my CT imaging I do not appreciate any acute abnormal findings.  I gave her reassurance and recommended Bentyl at home as she has had somewhat crampy colicky pain.  Recommended good bowel regimen.  IMPRESSION:  1. No acute localizing process in the abdomen or pelvis.  2. Colonic diverticulosis.  3. Distended bladder.  4. Subcentimeter Bosniak II renal cyst, too small to characterize.  No follow-up imaging is recommended.  JACR 2018 Feb; 264-273, Management of the Incidental Renal Mass on  CT, [employer] 2021; 814-848, Bosniak Classification of Cystic  Renal Masses, Version 2019.    Aortic Atherosclerosis (ICD10-I70.0).      Electronically Signed    By: Darliss Cheney M.D.    On: 05/03/2023 16:14    Is tolerating p.o.  Will discharge at this time       Gailen Shelter, [name] 05/03/23 2140    Cathren Laine, MD 05/03/23 2249

## 2023-05-03 NOTE — ED Triage Notes (Signed)
BIB EMS from independent living at [facility].  Abdominal pain. Dull pain.  Hx of diverticulitis and feels this is the same as before.

## 2023-05-04 ENCOUNTER — Ambulatory Visit: Payer: Medicare Other | Admitting: Family Medicine

## 2023-05-04 ENCOUNTER — Ambulatory Visit: Payer: Medicare Other | Admitting: Physical Therapy

## 2023-05-08 ENCOUNTER — Telehealth: Payer: Self-pay

## 2023-05-08 DIAGNOSIS — M542 Cervicalgia: Secondary | ICD-10-CM | POA: Diagnosis not present

## 2023-05-08 NOTE — Telephone Encounter (Signed)
Transition Care Management Unsuccessful Follow-up Telephone Call  Date of discharge and from where:  [facility]   Attempts:  1st Attempt  Reason for unsuccessful TCM follow-up call:  Unable to leave message   Lenard Forth [name] Guide, [facility] [phone] [address] [place], [place], [place] [address] Phone: [phone] Email: [email or web address]

## 2023-05-09 ENCOUNTER — Telehealth: Payer: Self-pay

## 2023-05-09 NOTE — Telephone Encounter (Signed)
Transition Care Management Unsuccessful Follow-up Telephone Call  Date of discharge and from where:  [facility]  Attempts:  2nd  Reason for unsuccessful TCM follow-up call:  Unable to leave message   Lenard Forth [facility] Guide, [facility] [phone] [address] [place], [place], [place] [address] Phone: [phone] Email: [email or web address]

## 2023-05-10 ENCOUNTER — Ambulatory Visit: Payer: Medicare Other | Attending: Internal Medicine | Admitting: Physical Therapy

## 2023-05-10 ENCOUNTER — Other Ambulatory Visit: Payer: Self-pay

## 2023-05-10 ENCOUNTER — Encounter: Payer: Self-pay | Admitting: Physical Therapy

## 2023-05-10 DIAGNOSIS — R42 Dizziness and giddiness: Secondary | ICD-10-CM | POA: Insufficient documentation

## 2023-05-10 DIAGNOSIS — R2681 Unsteadiness on feet: Secondary | ICD-10-CM | POA: Insufficient documentation

## 2023-05-10 NOTE — Therapy (Signed)
OUTPATIENT PHYSICAL THERAPY VESTIBULAR EVALUATION     Patient Name: Heather Olsen MRN: [id number] DOB:05-Feb-1931, 89 y.o., female Today's Date: 05/10/2023  END OF SESSION:  PT End of Session - 05/10/23 1411     Visit Number 1    Number of Visits 9    Date for PT Re-Evaluation 06/07/23    Authorization Type MCR/AARP    Authorization Time Period 05/10/23 to 06/07/23    Progress Note Due on Visit 10    PT Start Time 1243   front desk running behind   PT Stop Time 1313    PT Time Calculation (min) 30 min    Activity Tolerance Patient tolerated treatment well    Behavior During Therapy [facility] for tasks assessed/performed             Past Medical History:  Diagnosis Date   Aortic stenosis 04/12/2016   none noted on echo done 08/13/20   Coronary artery disease    Depression    Diverticulosis    Hearing loss 04/12/2016   Hiatal hernia 04/12/2016   HLD (hyperlipidemia) 04/12/2016   Hyperglycemia 09/14/2016   Hypertension    Idiopathic scoliosis 04/12/2016   Major depression 04/12/2016   Mitral regurgitation    Osteoporosis, senile 04/12/2016   Pernicious anemia    Raynaud's disease 04/12/2016   Scoliosis    Spinal stenosis of lumbar region 04/12/2016   Past Surgical History:  Procedure Laterality Date   APPENDECTOMY     BREAST SURGERY     broken wrist Right 2007   CARPAL TUNNEL RELEASE Left 04/04/2017   Procedure: LEFT CARPAL TUNNEL RELEASE;  Surgeon: Cindee Salt, MD;  Location: [facility];  Service: Orthopedics;  Laterality: Left;  REG/FAB   CARPAL TUNNEL RELEASE Right 12/01/2020   Procedure: CARPAL TUNNEL RELEASE;  Surgeon: Cindee Salt, MD;  Location: [facility];  Service: Orthopedics;  Laterality: Right;  IV REGIONAL FOREARM BLOCK   CATARACT EXTRACTION W/ INTRAOCULAR LENS  IMPLANT, BILATERAL Bilateral 2017   COLONOSCOPY  2016   FOOT SURGERY Right    PERIPHERAL VASCULAR INTERVENTION  11/11/2019   Procedure: PERIPHERAL VASCULAR INTERVENTION;   Surgeon: Maeola Harman, MD;  Location: [facility] INVASIVE CV LAB;  Service: Cardiovascular;;   TONSILLECTOMY     VISCERAL ANGIOGRAPHY N/A 11/11/2019   Procedure: VISCERAL ANGIOGRAPHY;  Surgeon: Maeola Harman, MD;  Location: [facility] INVASIVE CV LAB;  Service: Cardiovascular;  Laterality: N/A;   Patient Active Problem List   Diagnosis Date Noted   CHF (congestive heart failure) (HCC) 08/31/2022   Acute on chronic diastolic CHF (congestive heart failure) (HCC) 08/30/2022   Acute diverticulitis 08/25/2022   Chronic diastolic CHF (congestive heart failure) (HCC) 08/25/2022   Unintentional weight loss 08/25/2022   Metatarsalgia, left foot 02/25/2022   Anxiety 01/25/2022   Calcium pyrophosphate arthropathy of multiple sites 01/25/2022   Chronic insomnia 01/25/2022   Easy bruising 01/25/2022   Encounter for general adult medical examination without abnormal findings 01/25/2022   Gastroesophageal reflux disease 01/25/2022   History of depression 01/25/2022   History of diverticulitis 01/25/2022   Hyperglycemia due to type 2 diabetes mellitus (HCC) 01/25/2022   Irritable bowel syndrome with diarrhea 01/25/2022   Long term (current) use of anticoagulants - on Eliquis for PAF 01/25/2022   Long term (current) use of aspirin 01/25/2022   Memory loss 01/25/2022   Constipation 01/25/2022   Night sweats 01/25/2022   Nocturia 01/25/2022   Other idiopathic scoliosis, thoracolumbar region 01/25/2022   Other specified  postprocedural states 01/25/2022   Pain in thoracic spine 01/25/2022   Presence of coronary angioplasty implant and graft 01/25/2022   Primary thrombophilia (HCC) 01/25/2022   Pseudogout 01/25/2022   Pure hypercholesterolemia 01/25/2022   Raynaud's phenomenon 01/25/2022   Recurrent major depression in remission (HCC) 01/25/2022   Restless legs syndrome 01/25/2022   Senile hyperkeratosis 01/25/2022   Trochanteric bursitis of left hip 01/25/2022   Dermatochalasia  09/28/2021   Type 2 diabetes mellitus with unspecified complications (HCC) 08/05/2021   Pulmonary HTN (HCC) 04/11/2021   PAF (paroxysmal atrial fibrillation) (HCC) 05/31/2020   Dyspnea 12/29/2019   Degeneration of lumbar intervertebral disc 06/25/2019   Degenerative spondylolisthesis 06/25/2019   Osteopenia 06/25/2019   Atherosclerotic heart disease of native coronary artery without angina pectoris 03/01/2019   Leg swelling 03/01/2019   Dyslipidemia 03/01/2019   Bilateral carpal tunnel syndrome 03/27/2017   Urgency of micturition 10/11/2016   Hyperglycemia 09/14/2016   Tricuspid insufficiency 06/27/2016   Arteriosclerosis of coronary artery 04/12/2016   HLD (hyperlipidemia) 04/12/2016   Major depression 04/12/2016   Idiopathic scoliosis 04/12/2016   Spinal stenosis of lumbar region 04/12/2016   Aortic stenosis 04/12/2016   Mitral regurgitation 04/12/2016   Pernicious anemia 04/12/2016   Hearing loss 04/12/2016   Glaucoma 04/12/2016   Palpitations 04/12/2016   Hiatal hernia 04/12/2016   Back pain 04/12/2016   Osteoporosis, senile 04/12/2016   Anemia due to vitamin B12 deficiency 04/12/2016   Raynaud's disease 04/12/2016   Balance disorder 04/12/2016   Beat, premature ventricular 01/08/2016   APC (atrial premature contractions) 06/16/2014   Fatigue 06/17/2013   Essential hypertension 06/17/2013    PCP: Merri Brunette, MD REFERRING PROVIDER: Merri Brunette, MD  REFERRING DIAG: R42 (ICD-10-CM) - Vertigo  THERAPY DIAG:  Dizziness and giddiness  Unsteadiness on feet  ONSET DATE: "several months ago"   Rationale for Evaluation and Treatment: Rehabilitation  SUBJECTIVE:   SUBJECTIVE STATEMENT:  This started over a weekend several months ago, went to a walk in clinic and they gave me a prescription for Meclizine which made me sleepy but did not help the vertigo. My doctor prescribed some PT and I went to [facility] a couple of times, we did an exercise that was supposed  to get rid of. I thought it worked but it came back, we did it again but I'm still having some vertigo. Its worst when I lay down and when I sit up, sometimes I feel it when I stand but just a little. Got referred to this clinic for ongoing vestibular care, this was recommended by [facility] PT. Had MRI on back of neck and head this past Monday, already had xray.   Pt accompanied by: self  PERTINENT HISTORY: CAD, depression, hearing loss, HLD, HTN, idiopathic scoliosis, mitral regurgitation, raynaud's disease, hx broken wrist, carpal tunnel release, foot surgery  PAIN:  Are you having pain? Yes: NPRS scale: 5/10 Pain location: back of neck and head  Pain description: sharp  Aggravating factors: moving head/changing head position  Relieving factors: laying down on a pillow   PRECAUTIONS: None  WEIGHT BEARING RESTRICTIONS: No  FALLS: Has patient fallen in last 6 months? No, but does have fear of falling   LIVING ENVIRONMENT: Lives with:  friends home independent living section Lives in: House/apartment Stairs: No Has following equipment at home: Single point cane  PLOF: Independent, Independent with basic ADLs, Independent with gait, Independent with transfers, and Requires assistive device for independence  PATIENT GOALS: get rid of dizziness  OBJECTIVE:   DIAGNOSTIC FINDINGS: recent xray and MRI not available in epic  COGNITION: Overall cognitive status: Within functional limits for tasks assessed    Cervical ROM:    Active A/PROM (deg) eval  Flexion 30* audible painful crack   Extension 20* painful   Right lateral flexion 15* painful   Left lateral flexion 12* painful   Right rotation 30 degrees, painful   Left rotation 30 degrees, painful   (Blank rows = not tested)     VESTIBULAR ASSESSMENT:     SYMPTOM BEHAVIOR:  Subjective history: see above   Non-Vestibular symptoms: changes in vision and neck pain  Type of dizziness: Spinning/Vertigo  Frequency:  every time when laying down or sitting up   Duration: "less than a minute"   Aggravating factors: Induced by position change: lying supine, supine to sit, and sit to stand  Relieving factors: closing eyes  Progression of symptoms: better  OCULOMOTOR EXAM:  Ocular Alignment:  possible mild R eye ABD at rest   Ocular ROM: No Limitations  Spontaneous Nystagmus: absent  Gaze-Induced Nystagmus: age appropriate nystagmus at end range  Smooth Pursuits: intact  Saccades: intact  Convergence/Divergence: 20 cm     VESTIBULAR - OCULAR REFLEX:   Slow VOR: Normal  VOR Cancellation: Normal  Head-Impulse Test: deferred due to high irritability of neck pain     POSITIONAL TESTING: Other: modified posterior canal testing due to severe neck stiffness and pain- tested canals from Brandt-Daroff positions as able. L posterior canal negative, no nystagmus noted but positioning limited by neck pain even with modifications. R posterior canal produced 6-7/10 vertigo which resolved within 10 seconds(unable to see nystagmus/direction as pt had eyes shut tightly/did not open with cues)    BALANCE TESTS  Sharlene Motts- will complete 2nd session    VESTIBULAR TREATMENT:                                                                                                   DATE:   Eval  Objective measures/appropriate education/care planning      PATIENT EDUCATION: Education details: exam findings, looks like R posterior canal is still the problem- will continue to treat as able, POC  Person educated: Patient Education method: Explanation Education comprehension: verbalized understanding  HOME EXERCISE PROGRAM:  GOALS: Goals reviewed with patient? No  SHORT TERM GOALS: Target date: 06/07/2023    Will be compliant with appropriate progressive HEP  Baseline: Goal status: INITIAL  2.  Dizziness to have completely resolved  Baseline:  Goal status: INITIAL  3.  Will score at least 48 on Berg balance test  to show reduced fall risk  Baseline:  Goal status: INITIAL  4.  Will be able to complete appropriate cannalith repositioning techniques with MinA from family (for future use should problem recur) Baseline:  Goal status: INITIAL    LONG TERM GOALS: Target date: no LTGs appropriate, anticipate fast resolution of primary pt complaint     ASSESSMENT:  CLINICAL IMPRESSION: Patient is a 89 y.o. F who was seen today for physical therapy evaluation and treatment for vertigo. Of  note she was seen for a couple of sessions at [facility], vertigo initially got better but then returned  and the PT at that facility advised her to come here for ongoing vestibular care. She also has severe scoliosis as well as severe limitations from ROM/pain in her cervical spine which contraindicate Dix-Hallpike/Epley so all testing was performed in modified fashion using Brandt-Daroff positioning, but even this was a bit difficult for her to tolerate due to cervical impairments. Fairly confident that she has R posterior canal BPPV- will treat as able and plan to assess/treat balance next session as well.   OBJECTIVE IMPAIRMENTS: decreased balance, decreased mobility, and dizziness.   ACTIVITY LIMITATIONS: transfers and locomotion level  PARTICIPATION LIMITATIONS: meal prep, cleaning, laundry, driving, shopping, community activity, and church  PERSONAL FACTORS: Age, Past/current experiences, and Time since onset of injury/illness/exacerbation are also affecting patient's functional outcome.   REHAB POTENTIAL: Good  CLINICAL DECISION MAKING: Stable/uncomplicated  EVALUATION COMPLEXITY: Low   PLAN:  PT FREQUENCY: 2x/week  PT DURATION: 4 weeks  PLANNED INTERVENTIONS: Therapeutic exercises, Therapeutic activity, Neuromuscular re-education, Balance training, Gait training, Patient/Family education, Self Care, Vestibular training, Canalith repositioning, DME instructions, and Re-evaluation  PLAN FOR NEXT  SESSION: still needs Berg test done and potentially FOTO too; continue trying to treat likely R posterior canal BPPV via Brandt-Daroff with modifications PRN   Nedra Hai PT DPT PN2

## 2023-05-12 ENCOUNTER — Encounter: Payer: Self-pay | Admitting: Physical Therapy

## 2023-05-12 ENCOUNTER — Ambulatory Visit: Payer: Medicare Other | Admitting: Physical Therapy

## 2023-05-12 DIAGNOSIS — R2681 Unsteadiness on feet: Secondary | ICD-10-CM

## 2023-05-12 DIAGNOSIS — R42 Dizziness and giddiness: Secondary | ICD-10-CM | POA: Diagnosis not present

## 2023-05-12 NOTE — Therapy (Signed)
OUTPATIENT PHYSICAL THERAPY VESTIBULAR TREATMENT     Patient Name: Heather Olsen MRN: [id number] DOB:[birth year], 89 y.o., female Today's Date: 05/12/2023  END OF SESSION:  PT End of Session - 05/12/23 1326     Visit Number 2    Number of Visits 9    Date for PT Re-Evaluation 06/07/23    Authorization Type MCR/AARP    Authorization Time Period 05/10/23 to 06/07/23    Progress Note Due on Visit 10    PT Start Time 1236   pt a few minutes late   PT Stop Time 1315    PT Time Calculation (min) 39 min    Activity Tolerance Patient tolerated treatment well    Behavior During Therapy [facility] for tasks assessed/performed              Past Medical History:  Diagnosis Date   Aortic stenosis 04/12/2016   none noted on echo done 08/13/20   Coronary artery disease    Depression    Diverticulosis    Hearing loss 04/12/2016   Hiatal hernia 04/12/2016   HLD (hyperlipidemia) 04/12/2016   Hyperglycemia 09/14/2016   Hypertension    Idiopathic scoliosis 04/12/2016   Major depression 04/12/2016   Mitral regurgitation    Osteoporosis, senile 04/12/2016   Pernicious anemia    Raynaud's disease 04/12/2016   Scoliosis    Spinal stenosis of lumbar region 04/12/2016   Past Surgical History:  Procedure Laterality Date   APPENDECTOMY     BREAST SURGERY     broken wrist Right 2007   CARPAL TUNNEL RELEASE Left 04/04/2017   Procedure: LEFT CARPAL TUNNEL RELEASE;  Surgeon: Cindee Salt, MD;  Location: [facility];  Service: Orthopedics;  Laterality: Left;  REG/FAB   CARPAL TUNNEL RELEASE Right 12/01/2020   Procedure: CARPAL TUNNEL RELEASE;  Surgeon: Cindee Salt, MD;  Location: [facility];  Service: Orthopedics;  Laterality: Right;  IV REGIONAL FOREARM BLOCK   CATARACT EXTRACTION W/ INTRAOCULAR LENS  IMPLANT, BILATERAL Bilateral 2017   COLONOSCOPY  2016   FOOT SURGERY Right    PERIPHERAL VASCULAR INTERVENTION  11/11/2019   Procedure: PERIPHERAL VASCULAR INTERVENTION;  Surgeon:  Maeola Harman, MD;  Location: [facility] INVASIVE CV LAB;  Service: Cardiovascular;;   TONSILLECTOMY     VISCERAL ANGIOGRAPHY N/A 11/11/2019   Procedure: VISCERAL ANGIOGRAPHY;  Surgeon: Maeola Harman, MD;  Location: [facility] INVASIVE CV LAB;  Service: Cardiovascular;  Laterality: N/A;   Patient Active Problem List   Diagnosis Date Noted   CHF (congestive heart failure) (HCC) 08/31/2022   Acute on chronic diastolic CHF (congestive heart failure) (HCC) 08/30/2022   Acute diverticulitis 08/25/2022   Chronic diastolic CHF (congestive heart failure) (HCC) 08/25/2022   Unintentional weight loss 08/25/2022   Metatarsalgia, left foot 02/25/2022   Anxiety 01/25/2022   Calcium pyrophosphate arthropathy of multiple sites 01/25/2022   Chronic insomnia 01/25/2022   Easy bruising 01/25/2022   Encounter for general adult medical examination without abnormal findings 01/25/2022   Gastroesophageal reflux disease 01/25/2022   History of depression 01/25/2022   History of diverticulitis 01/25/2022   Hyperglycemia due to type 2 diabetes mellitus (HCC) 01/25/2022   Irritable bowel syndrome with diarrhea 01/25/2022   Long term (current) use of anticoagulants - on Eliquis for PAF 01/25/2022   Long term (current) use of aspirin 01/25/2022   Memory loss 01/25/2022   Constipation 01/25/2022   Night sweats 01/25/2022   Nocturia 01/25/2022   Other idiopathic scoliosis, thoracolumbar region 01/25/2022  Other specified postprocedural states 01/25/2022   Pain in thoracic spine 01/25/2022   Presence of coronary angioplasty implant and graft 01/25/2022   Primary thrombophilia (HCC) 01/25/2022   Pseudogout 01/25/2022   Pure hypercholesterolemia 01/25/2022   Raynaud's phenomenon 01/25/2022   Recurrent major depression in remission (HCC) 01/25/2022   Restless legs syndrome 01/25/2022   Senile hyperkeratosis 01/25/2022   Trochanteric bursitis of left hip 01/25/2022   Dermatochalasia 09/28/2021   Type  2 diabetes mellitus with unspecified complications (HCC) 08/05/2021   Pulmonary HTN (HCC) 04/11/2021   PAF (paroxysmal atrial fibrillation) (HCC) 05/31/2020   Dyspnea 12/29/2019   Degeneration of lumbar intervertebral disc 06/25/2019   Degenerative spondylolisthesis 06/25/2019   Osteopenia 06/25/2019   Atherosclerotic heart disease of native coronary artery without angina pectoris 03/01/2019   Leg swelling 03/01/2019   Dyslipidemia 03/01/2019   Bilateral carpal tunnel syndrome 03/27/2017   Urgency of micturition 10/11/2016   Hyperglycemia 09/14/2016   Tricuspid insufficiency 06/27/2016   Arteriosclerosis of coronary artery 04/12/2016   HLD (hyperlipidemia) 04/12/2016   Major depression 04/12/2016   Idiopathic scoliosis 04/12/2016   Spinal stenosis of lumbar region 04/12/2016   Aortic stenosis 04/12/2016   Mitral regurgitation 04/12/2016   Pernicious anemia 04/12/2016   Hearing loss 04/12/2016   Glaucoma 04/12/2016   Palpitations 04/12/2016   Hiatal hernia 04/12/2016   Back pain 04/12/2016   Osteoporosis, senile 04/12/2016   Anemia due to vitamin B12 deficiency 04/12/2016   Raynaud's disease 04/12/2016   Balance disorder 04/12/2016   Beat, premature ventricular 01/08/2016   APC (atrial premature contractions) 06/16/2014   Fatigue 06/17/2013   Essential hypertension 06/17/2013    PCP: Merri Brunette, MD REFERRING PROVIDER: Merri Brunette, MD  REFERRING DIAG: R42 (ICD-10-CM) - Vertigo  THERAPY DIAG:  Dizziness and giddiness  Unsteadiness on feet  ONSET DATE: "several months ago"   Rationale for Evaluation and Treatment: Rehabilitation  SUBJECTIVE:   SUBJECTIVE STATEMENT:  I think there might be a little bit of improvement in the vertigo, don't feel as dizzy when I lay down so maybe we are making progress.   Pt accompanied by: self  PERTINENT HISTORY: CAD, depression, hearing loss, HLD, HTN, idiopathic scoliosis, mitral regurgitation, raynaud's disease, hx broken  wrist, carpal tunnel release, foot surgery  PAIN:  Are you having pain? Yes: NPRS scale: 5-6/10 Pain location: R side of neck and also in back of neck/head  Pain description: "I get all kinds of pains, sometimes its sudden and sharp, sometimes its gradual pressure. Right now its just a steady pain"  Aggravating factors: moving head/changing head position  Relieving factors: laying down on a pillow   Dizziness 0/10 at rest   PRECAUTIONS: None  WEIGHT BEARING RESTRICTIONS: No  FALLS: Has patient fallen in last 6 months? No, but does have fear of falling   LIVING ENVIRONMENT: Lives with:  friends home independent living section Lives in: House/apartment Stairs: No Has following equipment at home: Single point cane  PLOF: Independent, Independent with basic ADLs, Independent with gait, Independent with transfers, and Requires assistive device for independence  PATIENT GOALS: get rid of dizziness   OBJECTIVE:   DIAGNOSTIC FINDINGS: recent xray and MRI not available in epic  COGNITION: Overall cognitive status: Within functional limits for tasks assessed    Cervical ROM:    Active A/PROM (deg) eval  Flexion 30* audible painful crack   Extension 20* painful   Right lateral flexion 15* painful   Left lateral flexion 12* painful   Right rotation 30  degrees, painful   Left rotation 30 degrees, painful   (Blank rows = not tested)     VESTIBULAR ASSESSMENT:     SYMPTOM BEHAVIOR:  Subjective history: see above   Non-Vestibular symptoms: changes in vision and neck pain  Type of dizziness: Spinning/Vertigo  Frequency: every time when laying down or sitting up   Duration: "less than a minute"   Aggravating factors: Induced by position change: lying supine, supine to sit, and sit to stand  Relieving factors: closing eyes  Progression of symptoms: better  OCULOMOTOR EXAM:  Ocular Alignment:  possible mild R eye ABD at rest   Ocular ROM: No Limitations  Spontaneous  Nystagmus: absent  Gaze-Induced Nystagmus: age appropriate nystagmus at end range  Smooth Pursuits: intact  Saccades: intact  Convergence/Divergence: 20 cm     VESTIBULAR - OCULAR REFLEX:   Slow VOR: Normal  VOR Cancellation: Normal  Head-Impulse Test: deferred due to high irritability of neck pain     POSITIONAL TESTING: Other: modified posterior canal testing due to severe neck stiffness and pain- tested canals from Brandt-Daroff positions as able. L posterior canal negative, no nystagmus noted but positioning limited by neck pain even with modifications. R posterior canal produced 6-7/10 vertigo which resolved within 10 seconds(unable to see nystagmus/direction as pt had eyes shut tightly/did not open with cues)    BALANCE TESTS  Berg: 05/12/23 33/56    VESTIBULAR TREATMENT:                                                                                                   DATE:   05/12/23  Attempted FOTO- not in system   Cannalith Repositioning  Brandt-Daroff for R posterior canal x5, upbeating R torsional nystagmus noted Intensity of dizziness with going from sit to sidelying improved from 6/10 to 2/10, no nystagmus noted on 4th and 5th reps Intensity of dizziness when going from side to sit improved from 5/10 to 3/10 by last rep  Physical Performance Tests  Sharlene Motts 33/56      Eval  Objective measures/appropriate education/care planning      PATIENT EDUCATION: Education details: exam findings, looks like R posterior canal is still the problem- will continue to treat as able, POC  Person educated: Patient Education method: Explanation Education comprehension: verbalized understanding  HOME EXERCISE PROGRAM:  Access Code: [email or web address] URL: [email or web address] Date: 05/12/2023 Prepared by: Nedra Hai  Exercises - Standing Narrow Base of Support  - 1-2 x daily - 7 x weekly - 1 sets - 3 reps - 30 hold - Standing Romberg to 1/2 Tandem Stance   - 1 x daily - 7 x weekly - 1 sets - 3 reps - 30 hold  GOALS: Goals reviewed with patient? No  SHORT TERM GOALS: Target date: 06/07/2023    Will be compliant with appropriate progressive HEP  Baseline: Goal status: INITIAL  2.  Dizziness to have completely resolved  Baseline:  Goal status: INITIAL  3.  Will score at least 48 on Berg balance test to show reduced fall risk  Baseline:  6/14 33/56 Goal status: INITIAL  4.  Will be able to complete appropriate cannalith repositioning techniques with MinA from family (for future use should problem recur) Baseline:  Goal status: INITIAL    LONG TERM GOALS: Target date: no LTGs appropriate, anticipate fast resolution of primary pt complaint     ASSESSMENT:  CLINICAL IMPRESSION:  Ms. Brezina arrives feeling about the same today- we focused session on R Brandt-Daroff for likely R posterior canal BPPV today. Did note upbeating, R rotational nystagmus on the first 3 maneuvers, on the second 2 nystagmus had resolved but she did still feel a bit dizzy. Otherwise tested balance via Sharlene Motts with high fall risk noted, she is agreeable to incorporating balance training in her sessions. Will continue to progress as appropriate.   OBJECTIVE IMPAIRMENTS: decreased balance, decreased mobility, and dizziness.   ACTIVITY LIMITATIONS: transfers and locomotion level  PARTICIPATION LIMITATIONS: meal prep, cleaning, laundry, driving, shopping, community activity, and church  PERSONAL FACTORS: Age, Past/current experiences, and Time since onset of injury/illness/exacerbation are also affecting patient's functional outcome.   REHAB POTENTIAL: Good  CLINICAL DECISION MAKING: Stable/uncomplicated  EVALUATION COMPLEXITY: Low   PLAN:  PT FREQUENCY: 2x/week  PT DURATION: 4 weeks  PLANNED INTERVENTIONS: Therapeutic exercises, Therapeutic activity, Neuromuscular re-education, Balance training, Gait training, Patient/Family education, Self Care,  Vestibular training, Canalith repositioning, DME instructions, and Re-evaluation  PLAN FOR NEXT SESSION: Continue trying to treat likely R posterior canal BPPV via Brandt-Daroff with modifications PRN (unable to tolerate positioning for Epley/Dix-Hallpike due to cervical pain). Balance training.   Nedra Hai PT DPT PN2

## 2023-05-18 ENCOUNTER — Ambulatory Visit: Payer: Medicare Other | Admitting: Physical Therapy

## 2023-05-18 ENCOUNTER — Encounter: Payer: Self-pay | Admitting: Physical Therapy

## 2023-05-18 VITALS — BP 118/58 | HR 62

## 2023-05-18 DIAGNOSIS — R2681 Unsteadiness on feet: Secondary | ICD-10-CM | POA: Diagnosis not present

## 2023-05-18 DIAGNOSIS — R42 Dizziness and giddiness: Secondary | ICD-10-CM | POA: Diagnosis not present

## 2023-05-18 DIAGNOSIS — M47812 Spondylosis without myelopathy or radiculopathy, cervical region: Secondary | ICD-10-CM | POA: Diagnosis not present

## 2023-05-18 NOTE — Therapy (Signed)
OUTPATIENT PHYSICAL THERAPY VESTIBULAR TREATMENT     Patient Name: Heather Olsen MRN: [id number] DOB:02-14-1931, 89 y.o., female Today's Date: 05/18/2023  END OF SESSION:  PT End of Session - 05/18/23 1409     Visit Number 3    Number of Visits 9    Date for PT Re-Evaluation 06/07/23    Authorization Type MCR/[employer]    Authorization Time Period 05/10/23 to 06/07/23    Progress Note Due on Visit 10    PT Start Time 1407    PT Stop Time 1436    PT Time Calculation (min) 29 min    Equipment Utilized During Treatment Gait belt    Activity Tolerance Patient tolerated treatment well    Behavior During Therapy [facility] for tasks assessed/performed              Past Medical History:  Diagnosis Date   Aortic stenosis 04/12/2016   none noted on echo done 08/13/20   Coronary artery disease    Depression    Diverticulosis    Hearing loss 04/12/2016   Hiatal hernia 04/12/2016   HLD (hyperlipidemia) 04/12/2016   Hyperglycemia 09/14/2016   Hypertension    Idiopathic scoliosis 04/12/2016   Major depression 04/12/2016   Mitral regurgitation    Osteoporosis, senile 04/12/2016   Pernicious anemia    Raynaud's disease 04/12/2016   Scoliosis    Spinal stenosis of lumbar region 04/12/2016   Past Surgical History:  Procedure Laterality Date   APPENDECTOMY     BREAST SURGERY     broken wrist Right 2007   CARPAL TUNNEL RELEASE Left 04/04/2017   Procedure: LEFT CARPAL TUNNEL RELEASE;  Surgeon: Cindee Salt, MD;  Location: [facility];  Service: Orthopedics;  Laterality: Left;  REG/FAB   CARPAL TUNNEL RELEASE Right 12/01/2020   Procedure: CARPAL TUNNEL RELEASE;  Surgeon: Cindee Salt, MD;  Location: [facility];  Service: Orthopedics;  Laterality: Right;  IV REGIONAL FOREARM BLOCK   CATARACT EXTRACTION W/ INTRAOCULAR LENS  IMPLANT, BILATERAL Bilateral 2017   COLONOSCOPY  2016   FOOT SURGERY Right    PERIPHERAL VASCULAR INTERVENTION  11/11/2019   Procedure: PERIPHERAL  VASCULAR INTERVENTION;  Surgeon: Maeola Harman, MD;  Location: [facility] INVASIVE CV LAB;  Service: Cardiovascular;;   TONSILLECTOMY     VISCERAL ANGIOGRAPHY N/A 11/11/2019   Procedure: VISCERAL ANGIOGRAPHY;  Surgeon: Maeola Harman, MD;  Location: [facility] INVASIVE CV LAB;  Service: Cardiovascular;  Laterality: N/A;   Patient Active Problem List   Diagnosis Date Noted   CHF (congestive heart failure) (HCC) 08/31/2022   Acute on chronic diastolic CHF (congestive heart failure) (HCC) 08/30/2022   Acute diverticulitis 08/25/2022   Chronic diastolic CHF (congestive heart failure) (HCC) 08/25/2022   Unintentional weight loss 08/25/2022   Metatarsalgia, left foot 02/25/2022   Anxiety 01/25/2022   Calcium pyrophosphate arthropathy of multiple sites 01/25/2022   Chronic insomnia 01/25/2022   Easy bruising 01/25/2022   Encounter for general adult medical examination without abnormal findings 01/25/2022   Gastroesophageal reflux disease 01/25/2022   History of depression 01/25/2022   History of diverticulitis 01/25/2022   Hyperglycemia due to type 2 diabetes mellitus (HCC) 01/25/2022   Irritable bowel syndrome with diarrhea 01/25/2022   Long term (current) use of anticoagulants - on Eliquis for PAF 01/25/2022   Long term (current) use of aspirin 01/25/2022   Memory loss 01/25/2022   Constipation 01/25/2022   Night sweats 01/25/2022   Nocturia 01/25/2022   Other idiopathic scoliosis, thoracolumbar region  01/25/2022   Other specified postprocedural states 01/25/2022   Pain in thoracic spine 01/25/2022   Presence of coronary angioplasty implant and graft 01/25/2022   Primary thrombophilia (HCC) 01/25/2022   Pseudogout 01/25/2022   Pure hypercholesterolemia 01/25/2022   Raynaud's phenomenon 01/25/2022   Recurrent major depression in remission (HCC) 01/25/2022   Restless legs syndrome 01/25/2022   Senile hyperkeratosis 01/25/2022   Trochanteric bursitis of left hip 01/25/2022    Dermatochalasia 09/28/2021   Type 2 diabetes mellitus with unspecified complications (HCC) 08/05/2021   Pulmonary HTN (HCC) 04/11/2021   PAF (paroxysmal atrial fibrillation) (HCC) 05/31/2020   Dyspnea 12/29/2019   Degeneration of lumbar intervertebral disc 06/25/2019   Degenerative spondylolisthesis 06/25/2019   Osteopenia 06/25/2019   Atherosclerotic heart disease of native coronary artery without angina pectoris 03/01/2019   Leg swelling 03/01/2019   Dyslipidemia 03/01/2019   Bilateral carpal tunnel syndrome 03/27/2017   Urgency of micturition 10/11/2016   Hyperglycemia 09/14/2016   Tricuspid insufficiency 06/27/2016   Arteriosclerosis of coronary artery 04/12/2016   HLD (hyperlipidemia) 04/12/2016   Major depression 04/12/2016   Idiopathic scoliosis 04/12/2016   Spinal stenosis of lumbar region 04/12/2016   Aortic stenosis 04/12/2016   Mitral regurgitation 04/12/2016   Pernicious anemia 04/12/2016   Hearing loss 04/12/2016   Glaucoma 04/12/2016   Palpitations 04/12/2016   Hiatal hernia 04/12/2016   Back pain 04/12/2016   Osteoporosis, senile 04/12/2016   Anemia due to vitamin B12 deficiency 04/12/2016   Raynaud's disease 04/12/2016   Balance disorder 04/12/2016   Beat, premature ventricular 01/08/2016   APC (atrial premature contractions) 06/16/2014   Fatigue 06/17/2013   Essential hypertension 06/17/2013    PCP: Merri Brunette, MD REFERRING PROVIDER: Merri Brunette, MD  REFERRING DIAG: R42 (ICD-10-CM) - Vertigo  THERAPY DIAG:  Dizziness and giddiness  Unsteadiness on feet  ONSET DATE: "several months ago"   Rationale for Evaluation and Treatment: Rehabilitation  SUBJECTIVE:   SUBJECTIVE STATEMENT:  Patient reports ongoing vertigo and dizziness. Patient also reports severe pain in her neck and back of neck. Patient reports history of dizziness that started several months ago. Patient reports that she started to take meclizine a few months ago which helped.  Patient reports that her dizziness came back. Patient reports that when she does get the room spinning it lasts for a few minutes. Patient reports that she has continued to get the room spinning when she lays down or sits up. Patient denies any falls since she was last here.   Pt accompanied by: self  PERTINENT HISTORY: CAD, depression, hearing loss, HLD, HTN, idiopathic scoliosis, mitral regurgitation, raynaud's disease, hx broken wrist, carpal tunnel release, foot surgery, osteoporosis   PAIN:  Are you having pain? Yes: NPRS scale: 4-5/10 Pain location: R side of neck and also in back of neck/head  Pain description: "I get all kinds of pains, sometimes its sudden and sharp, sometimes its gradual pressure. Right now its just a steady pain"  Aggravating factors: moving head/changing head position  Relieving factors: laying down on a pillow   Dizziness 0/10 at rest   PRECAUTIONS: None  WEIGHT BEARING RESTRICTIONS: No  FALLS: Has patient fallen in last 6 months? No, but does have fear of falling   LIVING ENVIRONMENT: Lives with:  friends home independent living section Lives in: House/apartment Stairs: No Has following equipment at home: Single point cane  PLOF: Independent, Independent with basic ADLs, Independent with gait, Independent with transfers, and Requires assistive device for independence  PATIENT GOALS: get rid  of dizziness   OBJECTIVE:   DIAGNOSTIC FINDINGS: recent xray and MRI not available in epic  COGNITION: Overall cognitive status: Within functional limits for tasks assessed   Cervical ROM:    Active A/PROM (deg) eval  Flexion 30* audible painful crack   Extension 20* painful   Right lateral flexion 15* painful   Left lateral flexion 12* painful   Right rotation 30 degrees, painful   Left rotation 30 degrees, painful   (Blank rows = not tested)   VESTIBULAR TREATMENT:                                                                                                    DATE:    VESTIBULAR ASSESSMENT: (Quickly reviewed vestibular/assessment & screen)  There were no vitals filed for this visit. SYMPTOM BEHAVIOR:  Subjective history: see above  Non-Vestibular symptoms: changes in vision, neck pain, and nausea/vomiting Type of dizziness: Imbalance (Disequilibrium), Spinning/Vertigo, Unsteady with head/body turns, "Funny feeling in the head", and "World moves"  Frequency: every time when laying down or sitting up   Duration: "less than a minute"  Aggravating factors: Induced by position change: lying supine, supine to sit, and sit to stand  Relieving factors: closing eyes  Progression of symptoms: better  OCULOMOTOR EXAM:  Ocular Alignment: mild exotoropia of R eye  Ocular ROM: No Limitations  Spontaneous Nystagmus: absent  Gaze-Induced Nystagmus: age appropriate nystagmus at end range  Smooth Pursuits: intact   Saccades: intact  Convergence/Divergence: 18 cm   VESTIBULAR - OCULAR REFLEX:   Slow VOR: Normal  VOR Cancellation: Normal  Head-Impulse Test: continued to hold test due to neck irritability    POSITIONAL TESTING:  Attempted modified positional testing for L & R posterior canal with sidelying + head tilt, patient negative on L and able to tolerate on R, initial attempt on R - no nystagmus but patient had not maintained head tilt, reported dizziness with mild nystagmus (direction unclear given short duration) when sat up, retested on R and small amplitude nystagmus R torsional upbeat noted with duration of < 8 seconds with latency < 4 seconds, patient reported neck pain in position and again when sat. Held further testing as patient unable to tolerate ROM needed for treatment  Discussed holding further treatment given ROM/pain/osteoporosis deficits. Discussed exploring referral to location with tilt table to address. Therapist followed up with [facility] if any in system and none present; therapist will reach out to outside clinics to  see if they have device without using patient identifiers/details. Will refer as indicated.  PATIENT EDUCATION: Education details: results + POC Person educated: Patient Education method: Explanation Education comprehension: verbalized understanding  HOME EXERCISE PROGRAM:  Access Code: [email or web address] URL: [email or web address] Date: 05/12/2023 Prepared by: Nedra Hai  Exercises - Standing Narrow Base of Support  - 1-2 x daily - 7 x weekly - 1 sets - 3 reps - 30 hold - Standing Romberg to 1/2 Tandem Stance  - 1 x daily - 7 x weekly - 1 sets - 3 reps - 30 hold  GOALS: Goals reviewed with patient? No  SHORT TERM GOALS: Target date: 06/07/2023    Will be compliant with appropriate progressive HEP  Baseline: Goal status: INITIAL  2.  Dizziness to have completely resolved  Baseline:  Goal status: INITIAL  3.  Will score at least 48 on Berg balance test to show reduced fall risk  Baseline:  6/14 33/56 Goal status: INITIAL  4.  Will be able to complete appropriate cannalith repositioning techniques with MinA from family (for future use should problem recur) Baseline:  Goal status: INITIAL    LONG TERM GOALS: Target date: no LTGs appropriate, anticipate fast resolution of primary pt complaint    ASSESSMENT:  CLINICAL IMPRESSION:  Given extent of cervical deficit, scoliosis,pain, and osteoporosis, therapist deems unsafe to attempt to treat patient with Epley or even modified positional sidelying treatments given patient's tolerance. Therapist consulted second PT in clinic who is in agreement. Therapist is seeking location for patient to be referred to in which they have a tilt table to take patient through necessary positioning without strain on neck. Patient in agreement. Patient symptoms consistent with R posterior canalithiasis. Will plan for transfer to other clinic and discharge from this setting given patient's needs.   OBJECTIVE IMPAIRMENTS: decreased  balance, decreased mobility, and dizziness.   ACTIVITY LIMITATIONS: transfers and locomotion level  PARTICIPATION LIMITATIONS: meal prep, cleaning, laundry, driving, shopping, community activity, and church  PERSONAL FACTORS: Age, Past/current experiences, and Time since onset of injury/illness/exacerbation are also affecting patient's functional outcome.   REHAB POTENTIAL: Good  CLINICAL DECISION MAKING: Stable/uncomplicated  EVALUATION COMPLEXITY: Low   PLAN:  PT FREQUENCY: 2x/week  PT DURATION: 4 weeks  PLANNED INTERVENTIONS: Therapeutic exercises, Therapeutic activity, Neuromuscular re-education, Balance training, Gait training, Patient/Family education, Self Care, Vestibular training, Canalith repositioning, DME instructions, and Re-evaluation  PLAN FOR NEXT SESSION: Not indicated - therapist following up to find place with tilt table to transfer patient, canceling remainder of appointments here as cannot be safely addressed per this therapist's opinion  Maryruth Eve, PT, DPT

## 2023-05-22 ENCOUNTER — Ambulatory Visit: Payer: Medicare Other | Admitting: Physical Therapy

## 2023-05-23 ENCOUNTER — Encounter: Payer: Self-pay | Admitting: Physical Therapy

## 2023-05-23 ENCOUNTER — Telehealth: Payer: Self-pay | Admitting: Physical Therapy

## 2023-05-23 NOTE — Therapy (Signed)
[facility] [address] [place], [place], [address] Phone: [phone]   Fax:  [phone]  Patient Details  Name: Heather Olsen MRN: [id number] Date of Birth: [birth year]  PHYSICAL THERAPY DISCHARGE SUMMARY  Visits from Start of Care: 3  Current functional level related to goals / functional outcomes: Unable to assess - discharging due to being unable to tolerate necessary treatment interventions even with modifications   Remaining deficits: BPPV ongoing   Education / Equipment: See telephone encounter, use of walker   Patient agrees to discharge. Patient goals were not met. Patient is being discharged due to  being unable to tolerate treatment even with modifications.   Carmelia Bake, PT, DPT 05/23/2023, 11:20 AM  [facility] [address] [place], [place], [address] Phone: [phone]   Fax:  [phone]

## 2023-05-23 NOTE — Telephone Encounter (Signed)
Therapist had reached out to [facility], [facility], and [facility] regarding if any of the had a TRV chair "vertigo chair;" patient identifiers excluded. No chairs known in area. Therapist called patient back and informed here that this was not option but may be able to trial a modified treatment technique called the "Duaine Dredge." Patient stated that she was overall handling her vertigo well and would not plan on returning to PT at this time. Therapist recommended use of walker for increased stability. Encouraged patient to call back if she has a change in status.   Maryruth Eve, PT, DPT

## 2023-05-26 ENCOUNTER — Ambulatory Visit: Payer: Medicare Other | Admitting: Physical Therapy

## 2023-05-26 DIAGNOSIS — M542 Cervicalgia: Secondary | ICD-10-CM | POA: Diagnosis not present

## 2023-05-26 DIAGNOSIS — M47812 Spondylosis without myelopathy or radiculopathy, cervical region: Secondary | ICD-10-CM | POA: Diagnosis not present

## 2023-05-29 ENCOUNTER — Ambulatory Visit: Payer: Medicare Other | Admitting: Physical Therapy

## 2023-05-29 DIAGNOSIS — N3020 Other chronic cystitis without hematuria: Secondary | ICD-10-CM | POA: Diagnosis not present

## 2023-05-29 DIAGNOSIS — N3946 Mixed incontinence: Secondary | ICD-10-CM | POA: Diagnosis not present

## 2023-05-29 DIAGNOSIS — R351 Nocturia: Secondary | ICD-10-CM | POA: Diagnosis not present

## 2023-05-31 DIAGNOSIS — M25512 Pain in left shoulder: Secondary | ICD-10-CM | POA: Diagnosis not present

## 2023-05-31 DIAGNOSIS — M542 Cervicalgia: Secondary | ICD-10-CM | POA: Diagnosis not present

## 2023-05-31 DIAGNOSIS — M79602 Pain in left arm: Secondary | ICD-10-CM | POA: Diagnosis not present

## 2023-06-02 ENCOUNTER — Ambulatory Visit: Payer: Medicare Other | Admitting: Physical Therapy

## 2023-06-05 ENCOUNTER — Ambulatory Visit: Payer: Medicare Other | Admitting: Physical Therapy

## 2023-06-05 DIAGNOSIS — K5790 Diverticulosis of intestine, part unspecified, without perforation or abscess without bleeding: Secondary | ICD-10-CM | POA: Diagnosis not present

## 2023-06-05 DIAGNOSIS — R1314 Dysphagia, pharyngoesophageal phase: Secondary | ICD-10-CM | POA: Diagnosis not present

## 2023-06-05 DIAGNOSIS — K219 Gastro-esophageal reflux disease without esophagitis: Secondary | ICD-10-CM | POA: Diagnosis not present

## 2023-06-05 DIAGNOSIS — K5901 Slow transit constipation: Secondary | ICD-10-CM | POA: Diagnosis not present

## 2023-06-07 ENCOUNTER — Ambulatory Visit: Payer: Medicare Other | Admitting: Physical Therapy

## 2023-06-07 DIAGNOSIS — Z8739 Personal history of other diseases of the musculoskeletal system and connective tissue: Secondary | ICD-10-CM | POA: Diagnosis not present

## 2023-06-07 DIAGNOSIS — M791 Myalgia, unspecified site: Secondary | ICD-10-CM | POA: Diagnosis not present

## 2023-06-07 DIAGNOSIS — M79622 Pain in left upper arm: Secondary | ICD-10-CM | POA: Diagnosis not present

## 2023-06-07 DIAGNOSIS — M1990 Unspecified osteoarthritis, unspecified site: Secondary | ICD-10-CM | POA: Diagnosis not present

## 2023-06-09 ENCOUNTER — Ambulatory Visit: Payer: Medicare Other | Admitting: Physical Therapy

## 2023-06-19 DIAGNOSIS — M47812 Spondylosis without myelopathy or radiculopathy, cervical region: Secondary | ICD-10-CM | POA: Diagnosis not present

## 2023-06-30 DIAGNOSIS — Z961 Presence of intraocular lens: Secondary | ICD-10-CM | POA: Diagnosis not present

## 2023-06-30 DIAGNOSIS — H26493 Other secondary cataract, bilateral: Secondary | ICD-10-CM | POA: Diagnosis not present

## 2023-06-30 DIAGNOSIS — H04123 Dry eye syndrome of bilateral lacrimal glands: Secondary | ICD-10-CM | POA: Diagnosis not present

## 2023-07-03 DIAGNOSIS — E1122 Type 2 diabetes mellitus with diabetic chronic kidney disease: Secondary | ICD-10-CM | POA: Diagnosis not present

## 2023-07-04 ENCOUNTER — Other Ambulatory Visit: Payer: Self-pay | Admitting: Cardiology

## 2023-07-04 DIAGNOSIS — I4891 Unspecified atrial fibrillation: Secondary | ICD-10-CM | POA: Diagnosis not present

## 2023-07-04 DIAGNOSIS — E782 Mixed hyperlipidemia: Secondary | ICD-10-CM | POA: Diagnosis not present

## 2023-07-04 DIAGNOSIS — M8589 Other specified disorders of bone density and structure, multiple sites: Secondary | ICD-10-CM | POA: Diagnosis not present

## 2023-07-04 DIAGNOSIS — M810 Age-related osteoporosis without current pathological fracture: Secondary | ICD-10-CM | POA: Diagnosis not present

## 2023-07-04 DIAGNOSIS — Z955 Presence of coronary angioplasty implant and graft: Secondary | ICD-10-CM | POA: Diagnosis not present

## 2023-07-04 DIAGNOSIS — I2510 Atherosclerotic heart disease of native coronary artery without angina pectoris: Secondary | ICD-10-CM | POA: Diagnosis not present

## 2023-07-04 DIAGNOSIS — E1122 Type 2 diabetes mellitus with diabetic chronic kidney disease: Secondary | ICD-10-CM | POA: Diagnosis not present

## 2023-07-04 DIAGNOSIS — I10 Essential (primary) hypertension: Secondary | ICD-10-CM | POA: Diagnosis not present

## 2023-07-10 DIAGNOSIS — M47812 Spondylosis without myelopathy or radiculopathy, cervical region: Secondary | ICD-10-CM | POA: Diagnosis not present

## 2023-07-12 DIAGNOSIS — E1122 Type 2 diabetes mellitus with diabetic chronic kidney disease: Secondary | ICD-10-CM | POA: Diagnosis not present

## 2023-07-12 DIAGNOSIS — Z66 Do not resuscitate: Secondary | ICD-10-CM | POA: Diagnosis not present

## 2023-07-12 DIAGNOSIS — F5104 Psychophysiologic insomnia: Secondary | ICD-10-CM | POA: Diagnosis not present

## 2023-07-16 DIAGNOSIS — R1032 Left lower quadrant pain: Secondary | ICD-10-CM | POA: Diagnosis not present

## 2023-07-16 DIAGNOSIS — K5792 Diverticulitis of intestine, part unspecified, without perforation or abscess without bleeding: Secondary | ICD-10-CM | POA: Diagnosis not present

## 2023-07-16 DIAGNOSIS — R82998 Other abnormal findings in urine: Secondary | ICD-10-CM | POA: Diagnosis not present

## 2023-07-19 DIAGNOSIS — R1032 Left lower quadrant pain: Secondary | ICD-10-CM | POA: Diagnosis not present

## 2023-07-19 DIAGNOSIS — R519 Headache, unspecified: Secondary | ICD-10-CM | POA: Diagnosis not present

## 2023-07-19 DIAGNOSIS — Z20822 Contact with and (suspected) exposure to covid-19: Secondary | ICD-10-CM | POA: Diagnosis not present

## 2023-07-19 DIAGNOSIS — R509 Fever, unspecified: Secondary | ICD-10-CM | POA: Diagnosis not present

## 2023-07-20 DIAGNOSIS — K5730 Diverticulosis of large intestine without perforation or abscess without bleeding: Secondary | ICD-10-CM | POA: Diagnosis not present

## 2023-07-24 DIAGNOSIS — D519 Vitamin B12 deficiency anemia, unspecified: Secondary | ICD-10-CM | POA: Diagnosis not present

## 2023-07-24 DIAGNOSIS — J3489 Other specified disorders of nose and nasal sinuses: Secondary | ICD-10-CM | POA: Diagnosis not present

## 2023-07-24 DIAGNOSIS — R140 Abdominal distension (gaseous): Secondary | ICD-10-CM | POA: Diagnosis not present

## 2023-07-24 DIAGNOSIS — N1832 Chronic kidney disease, stage 3b: Secondary | ICD-10-CM | POA: Diagnosis not present

## 2023-07-24 DIAGNOSIS — N393 Stress incontinence (female) (male): Secondary | ICD-10-CM | POA: Diagnosis not present

## 2023-07-24 DIAGNOSIS — R635 Abnormal weight gain: Secondary | ICD-10-CM | POA: Diagnosis not present

## 2023-07-24 DIAGNOSIS — I509 Heart failure, unspecified: Secondary | ICD-10-CM | POA: Diagnosis not present

## 2023-07-24 DIAGNOSIS — I10 Essential (primary) hypertension: Secondary | ICD-10-CM | POA: Diagnosis not present

## 2023-07-24 DIAGNOSIS — Z8719 Personal history of other diseases of the digestive system: Secondary | ICD-10-CM | POA: Diagnosis not present

## 2023-07-26 ENCOUNTER — Other Ambulatory Visit: Payer: Self-pay

## 2023-07-26 ENCOUNTER — Emergency Department (HOSPITAL_BASED_OUTPATIENT_CLINIC_OR_DEPARTMENT_OTHER): Payer: Medicare Other | Admitting: Radiology

## 2023-07-26 ENCOUNTER — Encounter (HOSPITAL_BASED_OUTPATIENT_CLINIC_OR_DEPARTMENT_OTHER): Payer: Self-pay | Admitting: Emergency Medicine

## 2023-07-26 ENCOUNTER — Emergency Department (HOSPITAL_BASED_OUTPATIENT_CLINIC_OR_DEPARTMENT_OTHER): Payer: Medicare Other

## 2023-07-26 ENCOUNTER — Inpatient Hospital Stay (HOSPITAL_BASED_OUTPATIENT_CLINIC_OR_DEPARTMENT_OTHER)
Admission: EM | Admit: 2023-07-26 | Discharge: 2023-07-28 | DRG: 291 | Disposition: A | Discharge status: Home Health | Payer: Medicare Other | Attending: Internal Medicine | Admitting: Internal Medicine

## 2023-07-26 DIAGNOSIS — I480 Paroxysmal atrial fibrillation: Secondary | ICD-10-CM | POA: Diagnosis not present

## 2023-07-26 DIAGNOSIS — K5730 Diverticulosis of large intestine without perforation or abscess without bleeding: Secondary | ICD-10-CM | POA: Diagnosis not present

## 2023-07-26 DIAGNOSIS — E119 Type 2 diabetes mellitus without complications: Secondary | ICD-10-CM | POA: Diagnosis present

## 2023-07-26 DIAGNOSIS — I700 Atherosclerosis of aorta: Secondary | ICD-10-CM | POA: Diagnosis not present

## 2023-07-26 DIAGNOSIS — F411 Generalized anxiety disorder: Secondary | ICD-10-CM | POA: Insufficient documentation

## 2023-07-26 DIAGNOSIS — Z888 Allergy status to other drugs, medicaments and biological substances status: Secondary | ICD-10-CM

## 2023-07-26 DIAGNOSIS — I080 Rheumatic disorders of both mitral and aortic valves: Secondary | ICD-10-CM | POA: Diagnosis present

## 2023-07-26 DIAGNOSIS — I5033 Acute on chronic diastolic (congestive) heart failure: Secondary | ICD-10-CM | POA: Diagnosis present

## 2023-07-26 DIAGNOSIS — Z1152 Encounter for screening for COVID-19: Secondary | ICD-10-CM

## 2023-07-26 DIAGNOSIS — I10 Essential (primary) hypertension: Secondary | ICD-10-CM | POA: Diagnosis not present

## 2023-07-26 DIAGNOSIS — N182 Chronic kidney disease, stage 2 (mild): Secondary | ICD-10-CM | POA: Diagnosis not present

## 2023-07-26 DIAGNOSIS — K219 Gastro-esophageal reflux disease without esophagitis: Secondary | ICD-10-CM | POA: Diagnosis present

## 2023-07-26 DIAGNOSIS — Z886 Allergy status to analgesic agent status: Secondary | ICD-10-CM

## 2023-07-26 DIAGNOSIS — Z7901 Long term (current) use of anticoagulants: Secondary | ICD-10-CM

## 2023-07-26 DIAGNOSIS — G2581 Restless legs syndrome: Secondary | ICD-10-CM | POA: Diagnosis present

## 2023-07-26 DIAGNOSIS — N179 Acute kidney failure, unspecified: Secondary | ICD-10-CM | POA: Diagnosis not present

## 2023-07-26 DIAGNOSIS — Z882 Allergy status to sulfonamides status: Secondary | ICD-10-CM

## 2023-07-26 DIAGNOSIS — M7989 Other specified soft tissue disorders: Secondary | ICD-10-CM | POA: Diagnosis not present

## 2023-07-26 DIAGNOSIS — Z79899 Other long term (current) drug therapy: Secondary | ICD-10-CM

## 2023-07-26 DIAGNOSIS — Z7984 Long term (current) use of oral hypoglycemic drugs: Secondary | ICD-10-CM

## 2023-07-26 DIAGNOSIS — G8929 Other chronic pain: Secondary | ICD-10-CM | POA: Diagnosis present

## 2023-07-26 DIAGNOSIS — M542 Cervicalgia: Secondary | ICD-10-CM | POA: Diagnosis present

## 2023-07-26 DIAGNOSIS — I440 Atrioventricular block, first degree: Secondary | ICD-10-CM | POA: Insufficient documentation

## 2023-07-26 DIAGNOSIS — R935 Abnormal findings on diagnostic imaging of other abdominal regions, including retroperitoneum: Secondary | ICD-10-CM | POA: Diagnosis not present

## 2023-07-26 DIAGNOSIS — M4120 Other idiopathic scoliosis, site unspecified: Secondary | ICD-10-CM | POA: Diagnosis present

## 2023-07-26 DIAGNOSIS — Z8679 Personal history of other diseases of the circulatory system: Secondary | ICD-10-CM | POA: Diagnosis not present

## 2023-07-26 DIAGNOSIS — R1032 Left lower quadrant pain: Secondary | ICD-10-CM | POA: Diagnosis not present

## 2023-07-26 DIAGNOSIS — E1122 Type 2 diabetes mellitus with diabetic chronic kidney disease: Secondary | ICD-10-CM | POA: Diagnosis present

## 2023-07-26 DIAGNOSIS — N3281 Overactive bladder: Secondary | ICD-10-CM | POA: Diagnosis present

## 2023-07-26 DIAGNOSIS — E1129 Type 2 diabetes mellitus with other diabetic kidney complication: Secondary | ICD-10-CM | POA: Diagnosis present

## 2023-07-26 DIAGNOSIS — I2510 Atherosclerotic heart disease of native coronary artery without angina pectoris: Secondary | ICD-10-CM | POA: Diagnosis present

## 2023-07-26 DIAGNOSIS — I110 Hypertensive heart disease with heart failure: Secondary | ICD-10-CM | POA: Diagnosis not present

## 2023-07-26 DIAGNOSIS — Z8249 Family history of ischemic heart disease and other diseases of the circulatory system: Secondary | ICD-10-CM

## 2023-07-26 DIAGNOSIS — I130 Hypertensive heart and chronic kidney disease with heart failure and stage 1 through stage 4 chronic kidney disease, or unspecified chronic kidney disease: Secondary | ICD-10-CM | POA: Diagnosis not present

## 2023-07-26 DIAGNOSIS — H9190 Unspecified hearing loss, unspecified ear: Secondary | ICD-10-CM | POA: Diagnosis present

## 2023-07-26 DIAGNOSIS — E785 Hyperlipidemia, unspecified: Secondary | ICD-10-CM | POA: Diagnosis present

## 2023-07-26 DIAGNOSIS — I509 Heart failure, unspecified: Secondary | ICD-10-CM | POA: Diagnosis not present

## 2023-07-26 DIAGNOSIS — Z8489 Family history of other specified conditions: Secondary | ICD-10-CM

## 2023-07-26 DIAGNOSIS — Z885 Allergy status to narcotic agent status: Secondary | ICD-10-CM

## 2023-07-26 DIAGNOSIS — M810 Age-related osteoporosis without current pathological fracture: Secondary | ICD-10-CM | POA: Diagnosis present

## 2023-07-26 DIAGNOSIS — I5031 Acute diastolic (congestive) heart failure: Secondary | ICD-10-CM | POA: Diagnosis not present

## 2023-07-26 DIAGNOSIS — I7300 Raynaud's syndrome without gangrene: Secondary | ICD-10-CM | POA: Diagnosis present

## 2023-07-26 DIAGNOSIS — E1121 Type 2 diabetes mellitus with diabetic nephropathy: Secondary | ICD-10-CM | POA: Diagnosis not present

## 2023-07-26 LAB — BASIC METABOLIC PANEL WITH GFR
Anion gap: 11 (ref 5–15)
BUN: 36 mg/dL — ABNORMAL HIGH (ref 8–23)
CO2: 25 mmol/L (ref 22–32)
Calcium: 9.2 mg/dL (ref 8.9–10.3)
Chloride: 96 mmol/L — ABNORMAL LOW (ref 98–111)
Creatinine, Ser: 1.41 mg/dL — ABNORMAL HIGH (ref 0.44–1.00)
GFR, Estimated: 35 mL/min — ABNORMAL LOW
Glucose, Bld: 184 mg/dL — ABNORMAL HIGH (ref 70–99)
Potassium: 4 mmol/L (ref 3.5–5.1)
Sodium: 132 mmol/L — ABNORMAL LOW (ref 135–145)

## 2023-07-26 LAB — BRAIN NATRIURETIC PEPTIDE: B Natriuretic Peptide: 328.1 pg/mL — ABNORMAL HIGH (ref 0.0–100.0)

## 2023-07-26 LAB — CBC
HCT: 34.1 % — ABNORMAL LOW (ref 36.0–46.0)
Hemoglobin: 11.5 g/dL — ABNORMAL LOW (ref 12.0–15.0)
MCH: 35.5 pg — ABNORMAL HIGH (ref 26.0–34.0)
MCHC: 33.7 g/dL (ref 30.0–36.0)
MCV: 105.2 fL — ABNORMAL HIGH (ref 80.0–100.0)
Platelets: 318 K/uL (ref 150–400)
RBC: 3.24 MIL/uL — ABNORMAL LOW (ref 3.87–5.11)
RDW: 12.4 % (ref 11.5–15.5)
WBC: 7 K/uL (ref 4.0–10.5)
nRBC: 0 % (ref 0.0–0.2)

## 2023-07-26 LAB — PROTIME-INR
INR: 1.2 (ref 0.8–1.2)
Prothrombin Time: 15.3 seconds — ABNORMAL HIGH (ref 11.4–15.2)

## 2023-07-26 LAB — SARS CORONAVIRUS 2 BY RT PCR: SARS Coronavirus 2 by RT PCR: NEGATIVE

## 2023-07-26 LAB — TROPONIN I (HIGH SENSITIVITY): Troponin I (High Sensitivity): 9 ng/L (ref <18)

## 2023-07-26 MED ORDER — FUROSEMIDE 10 MG/ML IJ SOLN
20.0000 mg | Freq: Once | INTRAMUSCULAR | Status: AC
  Administered 2023-07-26: 20 mg via INTRAVENOUS
  Filled 2023-07-26: qty 2

## 2023-07-26 MED ORDER — IOHEXOL 300 MG/ML  SOLN
100.0000 mL | Freq: Once | INTRAMUSCULAR | Status: AC | PRN
  Administered 2023-07-26: 60 mL via INTRAVENOUS

## 2023-07-26 NOTE — ED Notes (Signed)
Patient transported to CT 

## 2023-07-26 NOTE — Progress Notes (Signed)
Plan of Care Note for accepted transfer   Patient: Heather Olsen MRN: [id number]   DOA: 07/26/2023  Facility requesting transfer: [facility] Requesting Provider: Jeanelle Malling, PA Reason for transfer: CHF exacerbation, AKI Facility course:  Patient is a 89 year old female with HFpEF, PAF on Eliquis, CAD, T2DM, HTN presented to the ED for evaluation of shortness of breath, leg swelling, weight gain, and abdominal pain.  Patient reportedly increased home Lasix 40 mg to 60 mg x 3 days on advice of her PCP.  She also reportedly completed antibiotics for diverticulitis.  Labs notable for BNP 328.1, creatinine 1.4 (0.93 on 05/03/2023).  Vital stable, saturating 99% on room air.  CXR with hazy densities in both lungs.  SARS-CoV-2 PCR negative.  Patient was given IV Lasix 20 mg once.  Admission was requested due to concern for acute on chronic HFpEF and AKI.   Plan of care: The patient is accepted for admission to Telemetry unit, at [facility] or [facility], first available.  Author: Darreld Mclean, MD 07/26/2023  Check [email or web address] for on-call coverage.  Nursing staff, Please call [facility] Admits & Consults System-Wide number on Amion as soon as patient's arrival, so appropriate admitting provider can evaluate the pt.

## 2023-07-26 NOTE — ED Provider Notes (Signed)
[place] [facility]ERG[facility]Y DEPARTMENT AT [facility] Provider Note   CSN: [id number] Arrival date & time: 07/26/23  1359     History {Add pertinent medical, surgical, social history, OB history to HPI:1} Chief Complaint  Patient presents with  . Leg Swelling  . Congestive Heart Failure    Heather Olsen is a 89 y.o. female history of CAD, diverticulitis, hyperlipidemia, hypertension, CHF, A-fib on Eliquis presents today for evaluation of shortness of breath and abdominal pain.  Patient reports increased shortness of breath and bilateral leg swellings in the last 7 days.  She contacted her primary care physician to increase her Lasix dose from 40 mg to 60 mg daily for 3 days.  She denies any chest pain, fever, nausea, vomiting, bowel changes, urinary symptoms.  She has finished her antibiotics for diverticulitis however she still have some pain in her left lower quadrant.   Congestive Heart Failure   Past Medical History:  Diagnosis Date  . Aortic stenosis 04/12/2016   none noted on echo done 08/13/20  . Coronary artery disease   . Depression   . Diverticulosis   . Hearing loss 04/12/2016  . Hiatal hernia 04/12/2016  . HLD (hyperlipidemia) 04/12/2016  . Hyperglycemia 09/14/2016  . Hypertension   . Idiopathic scoliosis 04/12/2016  . Major depression 04/12/2016  . Mitral regurgitation   . Osteoporosis, senile 04/12/2016  . Pernicious anemia   . Raynaud's disease 04/12/2016  . Scoliosis   . Spinal stenosis of lumbar region 04/12/2016   Past Surgical History:  Procedure Laterality Date  . APPENDECTOMY    . BREAST SURGERY    . broken wrist Right 2007  . CARPAL TUNNEL RELEASE Left 04/04/2017   Procedure: LEFT CARPAL TUNNEL RELEASE;  Surgeon: Cindee Salt, MD;  Location: [facility];  Service: Orthopedics;  Laterality: Left;  REG/FAB  . CARPAL TUNNEL RELEASE Right 12/01/2020   Procedure: CARPAL TUNNEL RELEASE;  Surgeon: Cindee Salt, MD;  Location: [facility];   Service: Orthopedics;  Laterality: Right;  IV REGIONAL FOREARM BLOCK  . CATARACT EXTRACTION W/ INTRAOCULAR LENS  IMPLANT, BILATERAL Bilateral 2017  . COLONOSCOPY  2016  . FOOT SURGERY Right   . PERIPHERAL VASCULAR INTERVENTION  11/11/2019   Procedure: PERIPHERAL VASCULAR INTERVENTION;  Surgeon: Maeola Harman, MD;  Location: [facility] INVASIVE CV LAB;  Service: Cardiovascular;;  . TONSILLECTOMY    . VISCERAL ANGIOGRAPHY N/A 11/11/2019   Procedure: VISCERAL ANGIOGRAPHY;  Surgeon: Maeola Harman, MD;  Location: [facility] INVASIVE CV LAB;  Service: Cardiovascular;  Laterality: N/A;     Home Medications Prior to Admission medications   Medication Sig Start Date End Date Taking? Authorizing Provider  acetaminophen (TYLENOL) 500 MG tablet Take 500-1,000 mg by mouth every 6 (six) hours as needed for moderate pain.    [provider]  Cholecalciferol (VITAMIN D3) 2000 units capsule Take 2,000 Units by mouth daily.     [provider]  Cyanocobalamin 1000 MCG/ML LIQD Take by mouth.    [provider]  denosumab (PROLIA) 60 MG/ML SOSY injection Inject 60 mg into the skin every 6 (six) months. 07/25/19   [provider]  diclofenac Sodium (VOLTAREN) 1 % GEL Apply topically 4 (four) times daily.    [provider]  dicyclomine (BENTYL) 20 MG tablet Take 1 tablet (20 mg total) by mouth 2 (two) times daily. 05/03/23   Fondaw, Wylder S, PA  ELIQUIS 2.5 MG TABS tablet Take 2.5 mg by mouth 2 (two) times daily. 02/03/20  [provider]  furosemide (LASIX) 20 MG tablet Take 1 tablet (20 mg total) by mouth daily. 09/22/22   Rollene Rotunda, MD  ipratropium (ATROVENT) 0.06 % nasal spray Use 2 sprays in each nostril up to three times daily before meals for sinus drainage. Patient taking differently: Place 2 sprays into the nose daily. 09/30/16   Kimber Relic, MD  LINZESS 145 MCG CAPS capsule Take 145 mcg by mouth daily as needed (constipation). 07/29/22    [provider]  metFORMIN (GLUCOPHAGE-XR) 500 MG 24 hr tablet Take 1 tablet by mouth every evening. 02/01/22   [provider]  mirabegron ER (MYRBETRIQ) 50 MG TB24 tablet Take 1 tablet by mouth daily. 08/11/21   [provider]  MULTIPLE VITAMINS-MINERALS PO Take 1 tablet by mouth daily.     [provider]  nitrofurantoin, macrocrystal-monohydrate, (MACROBID) 100 MG capsule Take 100 mg by mouth every 12 (twelve) hours. 01/21/23   [provider]  nitroGLYCERIN (NITROSTAT) 0.4 MG SL tablet Place 1 tablet (0.4 mg total) under the tongue every 5 (five) minutes as needed for chest pain. Place one tablet under the tongue every 5 minutes as needed for chest pain. No more than 3 08/09/22   Rollene Rotunda, MD  pantoprazole (PROTONIX) 40 MG tablet Take 40 mg by mouth 2 (two) times daily. 12/18/19   [provider]  pindolol (VISKEN) 5 MG tablet Take 1/2 (one-half) tablet by mouth twice daily 07/04/23   Rollene Rotunda, MD  Probiotic Product (PROBIOTIC DAILY PO) Take 1 capsule by mouth every evening.    [provider]  rOPINIRole (REQUIP) 0.5 MG tablet Take 0.5 mg by mouth at bedtime. 07/13/22   [provider]  rosuvastatin (CRESTOR) 10 MG tablet TAKE 1 TABLET BY MOUTH AT BEDTIME Patient taking differently: Take 10 mg by mouth daily. 11/23/20   Maeola Harman, MD  sertraline (ZOLOFT) 25 MG tablet Take 25 mg by mouth at bedtime. 08/10/22   [provider]  traMADol (ULTRAM) 50 MG tablet TAKE 1 TABLET BY MOUTH EVERY 4 HOURS Patient taking differently: Take 50 mg by mouth every 12 (twelve) hours as needed for moderate pain or severe pain (pain). 11/14/16   Reed, Tiffany L, DO      Allergies    Sulfasalazine, Topiramate, Ace inhibitors, Codeine, Ibandronic acid, Meloxicam, Prevacid [lansoprazole], Sulfa antibiotics, Verapamil, and Zolpidem    Review of Systems   Review of Systems  Physical Exam Updated Vital Signs BP  124/78 (BP Location: Right Arm)   Pulse 66   Temp 98.7 F (37.1 C)   Resp 18   Ht 5\' 4"  (1.626 m)   Wt 48.5 kg   SpO2 94%   BMI 18.35 kg/m  Physical Exam  ED Results / Procedures / Treatments   Labs (all labs ordered are listed, but only abnormal results are displayed) Labs Reviewed  BASIC METABOLIC PANEL - Abnormal; Notable for the following components:      Result Value   Sodium 132 (*)    Chloride 96 (*)    Glucose, Bld 184 (*)    BUN 36 (*)    Creatinine, Ser 1.41 (*)    GFR, Estimated 35 (*)    All other components within normal limits  CBC - Abnormal; Notable for the following components:   RBC 3.24 (*)    Hemoglobin 11.5 (*)    HCT 34.1 (*)    MCV 105.2 (*)    MCH 35.5 (*)  All other components within normal limits  PROTIME-INR - Abnormal; Notable for the following components:   Prothrombin Time 15.3 (*)    All other components within normal limits    EKG EKG Interpretation Date/Time:  Wednesday July 26 2023 14:53:49 EDT Ventricular Rate:  62 PR Interval:  314 QRS Duration:  88 QT Interval:  430 QTC Calculation: 436 R Axis:   -71  Text Interpretation: Sinus rhythm with sinus arrhythmia with 1st degree A-V block Left axis deviation Anterior infarct , age undetermined Abnormal ECG When compared with ECG of 30-Aug-2022 13:26, PREVIOUS ECG IS PRESENT when compared to prior, overall similar appearance. No STEMI Confirmed by Theda Belfast ([id number]) on 07/26/2023 3:56:44 PM  Radiology DG Chest 2 View  Result Date: 07/26/2023 CLINICAL DATA:  History of CHF.  Swelling in the legs for 10 days. EXAM: CHEST - 2 VIEW COMPARISON:  08/30/2022 FINDINGS: Again noted is S-shaped scoliosis in the thoracic and lumbar spine. Compared to the previous chest radiograph, there is improved aeration in the lungs particularly at the right lung base. Heart size is upper limits of normal and stable. Atherosclerotic calcifications at the aortic arch. Hazy densities in both lungs but no  focal airspace disease or overt pulmonary edema. Stable sclerosis or densities in the region of the right first rib. IMPRESSION: 1. No acute cardiopulmonary disease. 2. Severe scoliosis. Electronically Signed   By: Richarda Overlie M.D.   On: 07/26/2023 15:47    Procedures Procedures  {Document cardiac monitor, telemetry assessment procedure when appropriate:1}  Medications Ordered in ED Medications - No data to display  ED Course/ Medical Decision Making/ A&P   {   Click here for ABCD2, HEART and other calculatorsREFRESH Note before signing :1}                              Medical Decision Making Amount and/or Complexity of Data Reviewed Labs: ordered. Radiology: ordered.   ***  {Document critical care time when appropriate:1} {Document review of labs and clinical decision tools ie heart score, Chads2Vasc2 etc:1}  {Document your independent review of radiology images, and any outside records:1} {Document your discussion with family members, caretakers, and with consultants:1} {Document social determinants of health affecting pt's care:1} {Document your decision making why or why not admission, treatments were needed:1} Final Clinical Impression(s) / ED Diagnoses Final diagnoses:  None    Rx / DC Orders ED Discharge Orders     None

## 2023-07-26 NOTE — ED Triage Notes (Signed)
Pt via pov from [facility] (independent living) with increased swelling due to CHF in the last few days. Pt has been seen by pcp and told to increase lasix from 40mg  to 60 mg. She received a call from them regarding her labs and was told to come to ED due to fluid collecting around her heart. Pt also just finished abx for diverticulitis but states she is not completely better. Pt alert & oriented, nad noted.

## 2023-07-27 ENCOUNTER — Encounter (HOSPITAL_COMMUNITY): Payer: Self-pay | Admitting: Internal Medicine

## 2023-07-27 ENCOUNTER — Observation Stay (HOSPITAL_COMMUNITY): Payer: Medicare Other

## 2023-07-27 DIAGNOSIS — E1122 Type 2 diabetes mellitus with diabetic chronic kidney disease: Secondary | ICD-10-CM | POA: Diagnosis present

## 2023-07-27 DIAGNOSIS — N179 Acute kidney failure, unspecified: Secondary | ICD-10-CM

## 2023-07-27 DIAGNOSIS — I10 Essential (primary) hypertension: Secondary | ICD-10-CM | POA: Diagnosis not present

## 2023-07-27 DIAGNOSIS — I5031 Acute diastolic (congestive) heart failure: Secondary | ICD-10-CM

## 2023-07-27 DIAGNOSIS — M4120 Other idiopathic scoliosis, site unspecified: Secondary | ICD-10-CM | POA: Diagnosis present

## 2023-07-27 DIAGNOSIS — M542 Cervicalgia: Secondary | ICD-10-CM | POA: Diagnosis present

## 2023-07-27 DIAGNOSIS — Z882 Allergy status to sulfonamides status: Secondary | ICD-10-CM | POA: Diagnosis not present

## 2023-07-27 DIAGNOSIS — Z1152 Encounter for screening for COVID-19: Secondary | ICD-10-CM | POA: Diagnosis not present

## 2023-07-27 DIAGNOSIS — E1121 Type 2 diabetes mellitus with diabetic nephropathy: Secondary | ICD-10-CM

## 2023-07-27 DIAGNOSIS — I509 Heart failure, unspecified: Secondary | ICD-10-CM | POA: Diagnosis present

## 2023-07-27 DIAGNOSIS — Z8679 Personal history of other diseases of the circulatory system: Secondary | ICD-10-CM

## 2023-07-27 DIAGNOSIS — Z886 Allergy status to analgesic agent status: Secondary | ICD-10-CM | POA: Diagnosis not present

## 2023-07-27 DIAGNOSIS — G2581 Restless legs syndrome: Secondary | ICD-10-CM

## 2023-07-27 DIAGNOSIS — N182 Chronic kidney disease, stage 2 (mild): Secondary | ICD-10-CM | POA: Diagnosis present

## 2023-07-27 DIAGNOSIS — I480 Paroxysmal atrial fibrillation: Secondary | ICD-10-CM | POA: Diagnosis present

## 2023-07-27 DIAGNOSIS — F411 Generalized anxiety disorder: Secondary | ICD-10-CM | POA: Diagnosis present

## 2023-07-27 DIAGNOSIS — Z888 Allergy status to other drugs, medicaments and biological substances status: Secondary | ICD-10-CM | POA: Diagnosis not present

## 2023-07-27 DIAGNOSIS — N3281 Overactive bladder: Secondary | ICD-10-CM | POA: Diagnosis present

## 2023-07-27 DIAGNOSIS — K5730 Diverticulosis of large intestine without perforation or abscess without bleeding: Secondary | ICD-10-CM

## 2023-07-27 DIAGNOSIS — Z885 Allergy status to narcotic agent status: Secondary | ICD-10-CM | POA: Diagnosis not present

## 2023-07-27 DIAGNOSIS — I5033 Acute on chronic diastolic (congestive) heart failure: Secondary | ICD-10-CM | POA: Diagnosis present

## 2023-07-27 DIAGNOSIS — G8929 Other chronic pain: Secondary | ICD-10-CM | POA: Diagnosis present

## 2023-07-27 DIAGNOSIS — I080 Rheumatic disorders of both mitral and aortic valves: Secondary | ICD-10-CM | POA: Diagnosis present

## 2023-07-27 DIAGNOSIS — E785 Hyperlipidemia, unspecified: Secondary | ICD-10-CM | POA: Diagnosis present

## 2023-07-27 DIAGNOSIS — K219 Gastro-esophageal reflux disease without esophagitis: Secondary | ICD-10-CM | POA: Diagnosis present

## 2023-07-27 DIAGNOSIS — I440 Atrioventricular block, first degree: Secondary | ICD-10-CM

## 2023-07-27 DIAGNOSIS — Z79899 Other long term (current) drug therapy: Secondary | ICD-10-CM | POA: Diagnosis not present

## 2023-07-27 DIAGNOSIS — Z7901 Long term (current) use of anticoagulants: Secondary | ICD-10-CM | POA: Diagnosis not present

## 2023-07-27 DIAGNOSIS — I130 Hypertensive heart and chronic kidney disease with heart failure and stage 1 through stage 4 chronic kidney disease, or unspecified chronic kidney disease: Secondary | ICD-10-CM | POA: Diagnosis present

## 2023-07-27 LAB — GLUCOSE, CAPILLARY
Glucose-Capillary: 253 mg/dL — ABNORMAL HIGH (ref 70–99)
Glucose-Capillary: 170 mg/dL — ABNORMAL HIGH (ref 70–99)
Glucose-Capillary: 227 mg/dL — ABNORMAL HIGH (ref 70–99)
Glucose-Capillary: 296 mg/dL — ABNORMAL HIGH (ref 70–99)

## 2023-07-27 LAB — COMPREHENSIVE METABOLIC PANEL
ALT: 18 U/L (ref 0–44)
AST: 27 U/L (ref 15–41)
Albumin: 3.4 g/dL — ABNORMAL LOW (ref 3.5–5.0)
Alkaline Phosphatase: 74 U/L (ref 38–126)
Anion gap: 12 (ref 5–15)
BUN: 29 mg/dL — ABNORMAL HIGH (ref 8–23)
CO2: 24 mmol/L (ref 22–32)
Calcium: 8.9 mg/dL (ref 8.9–10.3)
Chloride: 97 mmol/L — ABNORMAL LOW (ref 98–111)
Creatinine, Ser: 1.3 mg/dL — ABNORMAL HIGH (ref 0.44–1.00)
GFR, Estimated: 39 mL/min — ABNORMAL LOW (ref >60)
Glucose, Bld: 211 mg/dL — ABNORMAL HIGH (ref 70–99)
Potassium: 3.5 mmol/L (ref 3.5–5.1)
Sodium: 133 mmol/L — ABNORMAL LOW (ref 135–145)
Total Bilirubin: 0.3 mg/dL (ref 0.3–1.2)
Total Protein: 6.2 g/dL — ABNORMAL LOW (ref 6.5–8.1)

## 2023-07-27 LAB — CBC
HCT: 32.9 % — ABNORMAL LOW (ref 36.0–46.0)
HCT: 31.8 % — ABNORMAL LOW (ref 36.0–46.0)
Hemoglobin: 11 g/dL — ABNORMAL LOW (ref 12.0–15.0)
Hemoglobin: 11.1 g/dL — ABNORMAL LOW (ref 12.0–15.0)
MCH: 34.3 pg — ABNORMAL HIGH (ref 26.0–34.0)
MCH: 35.1 pg — ABNORMAL HIGH (ref 26.0–34.0)
MCHC: 33.4 g/dL (ref 30.0–36.0)
MCHC: 34.9 g/dL (ref 30.0–36.0)
MCV: 102.5 fL — ABNORMAL HIGH (ref 80.0–100.0)
MCV: 100.6 fL — ABNORMAL HIGH (ref 80.0–100.0)
Platelets: 334 K/uL (ref 150–400)
Platelets: 334 10*3/uL (ref 150–400)
RBC: 3.21 MIL/uL — ABNORMAL LOW (ref 3.87–5.11)
RBC: 3.16 MIL/uL — ABNORMAL LOW (ref 3.87–5.11)
RDW: 12.1 % (ref 11.5–15.5)
RDW: 12.1 % (ref 11.5–15.5)
WBC: 7.2 K/uL (ref 4.0–10.5)
WBC: 7.1 10*3/uL (ref 4.0–10.5)
nRBC: 0 % (ref 0.0–0.2)
nRBC: 0 % (ref 0.0–0.2)

## 2023-07-27 LAB — HEMOGLOBIN A1C
Hgb A1c MFr Bld: 7.5 % — ABNORMAL HIGH (ref 4.8–5.6)
Mean Plasma Glucose: 168.55 mg/dL

## 2023-07-27 LAB — COMPREHENSIVE METABOLIC PANEL WITH GFR
ALT: 17 U/L (ref 0–44)
AST: 34 U/L (ref 15–41)
Albumin: 3.8 g/dL (ref 3.5–5.0)
Alkaline Phosphatase: 79 U/L (ref 38–126)
Anion gap: 17 — ABNORMAL HIGH (ref 5–15)
BUN: 30 mg/dL — ABNORMAL HIGH (ref 8–23)
CO2: 25 mmol/L (ref 22–32)
Calcium: 9.5 mg/dL (ref 8.9–10.3)
Chloride: 94 mmol/L — ABNORMAL LOW (ref 98–111)
Creatinine, Ser: 1.16 mg/dL — ABNORMAL HIGH (ref 0.44–1.00)
GFR, Estimated: 45 mL/min — ABNORMAL LOW
Glucose, Bld: 124 mg/dL — ABNORMAL HIGH (ref 70–99)
Potassium: 3.6 mmol/L (ref 3.5–5.1)
Sodium: 136 mmol/L (ref 135–145)
Total Bilirubin: 0.7 mg/dL (ref 0.3–1.2)
Total Protein: 6.7 g/dL (ref 6.5–8.1)

## 2023-07-27 LAB — ECHOCARDIOGRAM COMPLETE
Area-P 1/2: 3.99 cm2
Height: 64 in
S' Lateral: 2.1 cm
Weight: 1707.24 [oz_av]

## 2023-07-27 LAB — TROPONIN I (HIGH SENSITIVITY): Troponin I (High Sensitivity): 10 ng/L (ref <18)

## 2023-07-27 MED ORDER — FUROSEMIDE 10 MG/ML IJ SOLN
20.0000 mg | Freq: Two times a day (BID) | INTRAMUSCULAR | Status: DC
  Administered 2023-07-27 – 2023-07-28 (×3): 20 mg via INTRAVENOUS
  Filled 2023-07-27 (×3): qty 2

## 2023-07-27 MED ORDER — APIXABAN 2.5 MG PO TABS: 2.5000 mg | ORAL_TABLET | Freq: Two times a day (BID) | ORAL | Status: DC

## 2023-07-27 MED ORDER — DICYCLOMINE HCL 20 MG PO TABS: 20.0000 mg | ORAL_TABLET | Freq: Two times a day (BID) | ORAL | Status: DC

## 2023-07-27 MED ORDER — TRAMADOL HCL 50 MG PO TABS: 50.0000 mg | ORAL_TABLET | Freq: Four times a day (QID) | ORAL | Status: DC | PRN

## 2023-07-27 MED ORDER — PINDOLOL 5 MG PO TABS
2.5000 mg | ORAL_TABLET | Freq: Two times a day (BID) | ORAL | Status: DC
  Administered 2023-07-27 – 2023-07-28 (×3): 2.5 mg via ORAL
  Filled 2023-07-27 (×4): qty 1

## 2023-07-27 MED ORDER — FUROSEMIDE 10 MG/ML IJ SOLN: 20.0000 mg | Freq: Two times a day (BID) | INTRAMUSCULAR | Status: DC

## 2023-07-27 MED ORDER — LINACLOTIDE 145 MCG PO CAPS
145.0000 ug | ORAL_CAPSULE | Freq: Every day | ORAL | Status: DC
  Administered 2023-07-28: 145 ug via ORAL
  Filled 2023-07-27 (×2): qty 1

## 2023-07-27 MED ORDER — ONDANSETRON HCL 4 MG PO TABS: 4.0000 mg | ORAL_TABLET | Freq: Four times a day (QID) | ORAL | Status: DC | PRN

## 2023-07-27 MED ORDER — HYDRALAZINE HCL 20 MG/ML IJ SOLN: 10.0000 mg | Freq: Three times a day (TID) | INTRAMUSCULAR | Status: DC | PRN

## 2023-07-27 MED ORDER — NITROGLYCERIN 0.4 MG SL SUBL: 0.4000 mg | SUBLINGUAL_TABLET | SUBLINGUAL | Status: DC | PRN

## 2023-07-27 MED ORDER — RISAQUAD PO CAPS
1.0000 | ORAL_CAPSULE | Freq: Every evening | ORAL | Status: DC
  Administered 2023-07-27: 1 via ORAL
  Filled 2023-07-27: qty 1

## 2023-07-27 MED ORDER — TRAMADOL HCL 50 MG PO TABS
50.0000 mg | ORAL_TABLET | Freq: Two times a day (BID) | ORAL | Status: DC
  Administered 2023-07-27 – 2023-07-28 (×3): 50 mg via ORAL
  Filled 2023-07-27 (×3): qty 1

## 2023-07-27 MED ORDER — ACETAMINOPHEN 650 MG RE SUPP: 650.0000 mg | Freq: Four times a day (QID) | RECTAL | Status: DC | PRN

## 2023-07-27 MED ORDER — TRAMADOL HCL 50 MG PO TABS
50.0000 mg | ORAL_TABLET | Freq: Once | ORAL | Status: AC
  Administered 2023-07-27: 50 mg via ORAL
  Filled 2023-07-27: qty 1

## 2023-07-27 MED ORDER — MORPHINE SULFATE (PF) 2 MG/ML IV SOLN
1.0000 mg | INTRAVENOUS | Status: DC | PRN
  Administered 2023-07-27 – 2023-07-28 (×3): 1 mg via INTRAVENOUS
  Filled 2023-07-27 (×3): qty 1

## 2023-07-27 MED ORDER — ROSUVASTATIN CALCIUM 5 MG PO TABS
10.0000 mg | ORAL_TABLET | Freq: Every day | ORAL | Status: DC
  Administered 2023-07-27: 10 mg via ORAL
  Filled 2023-07-27: qty 2

## 2023-07-27 MED ORDER — ROPINIROLE HCL 1 MG PO TABS
0.5000 mg | ORAL_TABLET | Freq: Every day | ORAL | Status: DC
  Administered 2023-07-27: 0.5 mg via ORAL
  Filled 2023-07-27: qty 1

## 2023-07-27 MED ORDER — LINACLOTIDE 145 MCG PO CAPS: 145.0000 ug | ORAL_CAPSULE | Freq: Every day | ORAL | Status: DC | PRN

## 2023-07-27 MED ORDER — APIXABAN 2.5 MG PO TABS
2.5000 mg | ORAL_TABLET | Freq: Two times a day (BID) | ORAL | Status: DC
  Administered 2023-07-27 – 2023-07-28 (×3): 2.5 mg via ORAL
  Filled 2023-07-27 (×3): qty 1

## 2023-07-27 MED ORDER — MIRABEGRON ER 50 MG PO TB24
50.0000 mg | ORAL_TABLET | Freq: Every day | ORAL | Status: DC
  Administered 2023-07-27 – 2023-07-28 (×2): 50 mg via ORAL
  Filled 2023-07-27 (×2): qty 1

## 2023-07-27 MED ORDER — ACETAMINOPHEN 325 MG PO TABS: 650.0000 mg | ORAL_TABLET | Freq: Four times a day (QID) | ORAL | Status: DC | PRN

## 2023-07-27 MED ORDER — MORPHINE SULFATE (PF) 2 MG/ML IV SOLN
1.0000 mg | Freq: Once | INTRAVENOUS | Status: AC
  Administered 2023-07-27: 1 mg via INTRAVENOUS
  Filled 2023-07-27: qty 1

## 2023-07-27 MED ORDER — INSULIN ASPART 100 UNIT/ML IJ SOLN
0.0000 [IU] | Freq: Three times a day (TID) | INTRAMUSCULAR | Status: DC
  Administered 2023-07-27: 1 [IU] via SUBCUTANEOUS
  Administered 2023-07-27: 2 [IU] via SUBCUTANEOUS
  Administered 2023-07-27: 3 [IU] via SUBCUTANEOUS
  Administered 2023-07-28: 2 [IU] via SUBCUTANEOUS

## 2023-07-27 MED ORDER — PINDOLOL 5 MG PO TABS: 2.5000 mg | ORAL_TABLET | Freq: Two times a day (BID) | ORAL | Status: DC

## 2023-07-27 MED ORDER — SERTRALINE HCL 50 MG PO TABS
50.0000 mg | ORAL_TABLET | Freq: Every day | ORAL | Status: DC
  Administered 2023-07-27: 50 mg via ORAL
  Filled 2023-07-27: qty 1

## 2023-07-27 MED ORDER — PREDNISONE 20 MG PO TABS
40.0000 mg | ORAL_TABLET | Freq: Every day | ORAL | Status: DC
  Administered 2023-07-27 – 2023-07-28 (×2): 40 mg via ORAL
  Filled 2023-07-27 (×2): qty 2

## 2023-07-27 MED ORDER — ONDANSETRON HCL 4 MG/2ML IJ SOLN: 4.0000 mg | Freq: Four times a day (QID) | INTRAMUSCULAR | Status: DC | PRN

## 2023-07-27 MED ORDER — PANTOPRAZOLE SODIUM 40 MG PO TBEC
40.0000 mg | DELAYED_RELEASE_TABLET | Freq: Two times a day (BID) | ORAL | Status: DC
  Administered 2023-07-27 – 2023-07-28 (×3): 40 mg via ORAL
  Filled 2023-07-27 (×3): qty 1

## 2023-07-27 MED ORDER — BISACODYL 5 MG PO TBEC: 5.0000 mg | DELAYED_RELEASE_TABLET | Freq: Every day | ORAL | Status: DC | PRN

## 2023-07-27 MED ORDER — TRAZODONE HCL 50 MG PO TABS
50.0000 mg | ORAL_TABLET | Freq: Every day | ORAL | Status: DC
  Administered 2023-07-27: 50 mg via ORAL
  Filled 2023-07-27: qty 1

## 2023-07-27 NOTE — Evaluation (Signed)
Physical Therapy Evaluation Patient Details Name: Heather Olsen MRN: [id number] DOB: Apr 02, 1931 Today's Date: 07/27/2023  History of Present Illness  Patient is a 89 year old female admitted 8/28 with shortness of breath, bilateral LE edema, weight gain and abdominal pain.  W/u for CHF exacerbation.  Also with vertigo of which she had been going to Outpt PT.  PMH: diastolic CHF, preserved EF, paroxysmal A-fib on Eliquis, DM type II, HTN, CKD stage II  Clinical Impression  Pt admitted with above diagnosis. Pt was able to ambulate with RW with good safety overall. Pt states she has her husband's rollator and she can use it for safety initially upon return home as she feels safer with RW. Treated pt for right BPPV via Epley maneuver.  Pt also agrees that she would like HHPT for vestibular rehab at [facility].  Pt should progress well.  Pt currently with functional limitations due to the deficits listed below (see PT Problem List). Pt will benefit from acute skilled PT to increase their independence and safety with mobility to allow discharge.           If plan is discharge home, recommend the following:     Can travel by private vehicle        Equipment Recommendations None recommended by PT  Recommendations for Other Services       Functional Status Assessment Patient has had a recent decline in their functional status and demonstrates the ability to make significant improvements in function in a reasonable and predictable amount of time.     Precautions / Restrictions Precautions Precautions: Fall Restrictions Weight Bearing Restrictions: No      Mobility  Bed Mobility Overal bed mobility: Needs Assistance Bed Mobility: Rolling, Sidelying to Sit, Sit to Supine Rolling: Contact guard assist, Min assist Sidelying to sit: Min assist   Sit to supine: Min assist   General bed mobility comments: Pt tested for BPPV and found to have right BPPV and treated with Epley maneuver. Pt  needed min assist for bed mobility as she was dizzy. Pt assisted back to bed at end of rx due to going for Echo.    Transfers Overall transfer level: Needs assistance Equipment used: Rolling walker (2 wheels) Transfers: Sit to/from Stand Sit to Stand: Contact guard assist           General transfer comment: cues for hand placement and steadying assist neded due to slight vertigo.    Ambulation/Gait Ambulation/Gait assistance: Contact guard assist Gait Distance (Feet): 100 Feet Assistive device: Rolling walker (2 wheels) Gait Pattern/deviations: Step-through pattern, Decreased stride length   Gait velocity interpretation: <1.31 ft/sec, indicative of household ambulator   General Gait Details: Slow and cautious gait but no LOB with RW use. Pt agrees that she would be safer using device initially.  Stairs            Wheelchair Mobility     Tilt Bed    Modified Rankin (Stroke Patients Only)       Balance Overall balance assessment: Needs assistance Sitting-balance support: No upper extremity supported, Feet supported Sitting balance-Leahy Scale: Fair     Standing balance support: Bilateral upper extremity supported, During functional activity Standing balance-Leahy Scale: Poor Standing balance comment: needs bil UE support                             Pertinent Vitals/Pain Pain Assessment Pain Assessment: 0-10 Pain Score: 8  Pain Location: neck and  head Pain Descriptors / Indicators: Aching, Discomfort, Grimacing, Guarding Pain Intervention(s): Limited activity within patient's tolerance, Monitored during session, Repositioned    Home Living Family/patient expects to be discharged to:: Private residence ([facility]) Living Arrangements: Alone Available Help at Discharge: Available PRN/intermittently Type of Home: Independent living facility Home Access: Level entry       Home Layout: One level Home Equipment: Shower seat - built  in;Rollator (4 wheels);Cane - single Librarian, academic (2 wheels)      Prior Function               Mobility Comments: She did not use an assistive device for ambulation inside, however used a cane when ambulating to & from [employer] and when out in the community. ADLs Comments: She was independent with ADLs, performing light meal prep, and driving.     Extremity/Trunk Assessment   Upper Extremity Assessment Upper Extremity Assessment: Defer to OT evaluation    Lower Extremity Assessment Lower Extremity Assessment: Generalized weakness    Cervical / Trunk Assessment Cervical / Trunk Assessment: Normal  Communication   Communication Communication: No apparent difficulties  Cognition Arousal: Alert Behavior During Therapy: WFL for tasks assessed/performed Overall Cognitive Status: Within Functional Limits for tasks assessed                                          General Comments General comments (skin integrity, edema, etc.): VSS    Exercises     Assessment/Plan    PT Assessment Patient needs continued PT services  PT Problem List Decreased balance;Decreased activity tolerance;Decreased mobility;Decreased knowledge of use of DME;Decreased safety awareness;Decreased knowledge of precautions;Cardiopulmonary status limiting activity       PT Treatment Interventions DME instruction;Gait training;Functional mobility training;Therapeutic activities;Therapeutic exercise;Balance training;Patient/family education    PT Goals (Current goals can be found in the Care Plan section)  Acute Rehab PT Goals Patient Stated Goal: to go home PT Goal Formulation: With patient Time For Goal Achievement: 08/10/23 Potential to Achieve Goals: Good    Frequency Min 1X/week     Co-evaluation               AM-PAC PT "6 Clicks" Mobility  Outcome Measure Help needed turning from your back to your side while in a flat bed without using bedrails?:  None Help needed moving from lying on your back to sitting on the side of a flat bed without using bedrails?: A Little Help needed moving to and from a bed to a chair (including a wheelchair)?: A Little Help needed standing up from a chair using your arms (e.g., wheelchair or bedside chair)?: A Little Help needed to walk in hospital room?: A Little Help needed climbing 3-5 steps with a railing? : A Little 6 Click Score: 19    End of Session Equipment Utilized During Treatment: Gait belt Activity Tolerance: Patient limited by fatigue Patient left: in bed;with call bell/phone within reach;with bed alarm set;with nursing/sitter in room Nurse Communication: Mobility status PT Visit Diagnosis: Muscle weakness (generalized) (M62.81);Other abnormalities of gait and mobility (R26.89)    Time: 1013-1040 PT Time Calculation (min) (ACUTE ONLY): 27 min   Charges:   PT Evaluation $PT Eval Moderate Complexity: 1 Mod PT Treatments $Canalith Rep Proc: 8-22 mins PT General Charges $$ ACUTE PT VISIT: 1 Visit         Naida Escalante M,PT Acute Rehab Services  [phone]   Bevelyn Buckles 07/27/2023, 2:01 PM

## 2023-07-27 NOTE — H&P (Addendum)
History and Physical    Heather Olsen ZOX:[id number] DOB: December 02, 1930 DOA: 07/26/2023  PCP: Merri Brunette, MD   Patient coming from: Home   Chief Complaint:  Chief Complaint  Patient presents with   Leg Swelling   Congestive Heart Failure    HPI:  Heather Olsen is a 89 y.o. female with medical history significant of diastolic heart failure with preserved EF, paroxysmal atrial fibrillation on Eliquis, DM type II, essential hypertension and CKD stage II presented to emergency department with complaining of shortness of breath, bilateral lower extremity swelling, weight gain and abdominal pain.  Patient reported that she has been noticing worsening shortness of breath and progressively worsening bilateral lower extremity swelling and she has increased her Lasix 40 mg to 60 mg for last 3 days which has been advised by the PCP.  She also recently completed course of antibiotic for diverticulitis. During my evaluation patient denies any chest pain, shortness of breath, palpitation, headache, blurry vision, abdominal pain, nausea, vomiting and diarrhea.  She reported completed antibiotic course for diverticulitis yesterday.  She is complaining about chronic vertigo and currently getting outpatient physical therapy I requested for physical therapy in patient as well.   ED Course:  At presentation to ED patient is hemodynamically stable. BMP showed slight low sodium 132 which is around patient baseline, potassium 4, chloride 96, bicarb 25, blood glucose 148, BUN 36, creatinine creatinine 1.41, calcium 9.2, low GFR 35 anion gap 11. CBC grossly unremarkable. Elevated BNP 328. Respiratory panel ruled out COVID. High sensitive troponin 9 WNL. Chest x-ray no acute cardiopulmonary disease and severe scoliosis. CT abdomen and pelvis showed no acute localized process in the abdomen.  Severe sigmoid colon diverticulosis without evidence of acute diverticulitis.  Aortic atherosclerosis.  With the concern for  CHF exacerbation in the ED patient has been treated with Lasix 20 mg IV once.  Patient has been transferred and hospitalist has consulted for admission for management of CHF.     Review of Systems:  Review of Systems  Constitutional:  Negative for chills and fever.  Eyes:  Negative for blurred vision.  Respiratory:  Negative for cough and sputum production.   Cardiovascular:  Positive for leg swelling. Negative for chest pain and orthopnea.  Gastrointestinal:  Negative for heartburn, nausea and vomiting.  Musculoskeletal:  Negative for myalgias and neck pain.  Neurological:  Positive for dizziness. Negative for tingling, tremors and headaches.  Psychiatric/Behavioral:  The patient is not nervous/anxious.     Past Medical History:  Diagnosis Date   Aortic stenosis 04/12/2016   none noted on echo done 08/13/20   Coronary artery disease    Depression    Diverticulosis    Hearing loss 04/12/2016   Hiatal hernia 04/12/2016   HLD (hyperlipidemia) 04/12/2016   Hyperglycemia 09/14/2016   Hypertension    Idiopathic scoliosis 04/12/2016   Major depression 04/12/2016   Mitral regurgitation    Osteoporosis, senile 04/12/2016   Pernicious anemia    Raynaud's disease 04/12/2016   Scoliosis    Spinal stenosis of lumbar region 04/12/2016    Past Surgical History:  Procedure Laterality Date   APPENDECTOMY     BREAST SURGERY     broken wrist Right 2007   CARPAL TUNNEL RELEASE Left 04/04/2017   Procedure: LEFT CARPAL TUNNEL RELEASE;  Surgeon: Cindee Salt, MD;  Location: [facility];  Service: Orthopedics;  Laterality: Left;  REG/FAB   CARPAL TUNNEL RELEASE Right 12/01/2020   Procedure: CARPAL TUNNEL RELEASE;  Surgeon: Merlyn Lot,  Jillyn Hidden, MD;  Location: [facility];  Service: Orthopedics;  Laterality: Right;  IV REGIONAL FOREARM BLOCK   CATARACT EXTRACTION W/ INTRAOCULAR LENS  IMPLANT, BILATERAL Bilateral 2017   COLONOSCOPY  2016   FOOT SURGERY Right    PERIPHERAL VASCULAR  INTERVENTION  11/11/2019   Procedure: PERIPHERAL VASCULAR INTERVENTION;  Surgeon: Maeola Harman, MD;  Location: [facility] INVASIVE CV LAB;  Service: Cardiovascular;;   TONSILLECTOMY     VISCERAL ANGIOGRAPHY N/A 11/11/2019   Procedure: VISCERAL ANGIOGRAPHY;  Surgeon: Maeola Harman, MD;  Location: [facility] INVASIVE CV LAB;  Service: Cardiovascular;  Laterality: N/A;     reports that she has never smoked. She has never been exposed to tobacco smoke. She has never used smokeless tobacco. She reports current alcohol use. She reports that she does not use drugs.  Allergies  Allergen Reactions   Sulfasalazine Other (See Comments)    Other reaction(s): Other (See Comments), headaches, headaches, headaches   Topiramate Rash   Ace Inhibitors Cough   Codeine Nausea Only   Ibandronic Acid Nausea Only   Meloxicam     Other reaction(s): GI upset, GI Upset (intolerance), Other (See Comments) unknown    Prevacid [Lansoprazole]     Chest pain   Sulfa Antibiotics Other (See Comments)    headaches   Verapamil Other (See Comments)    constipation   Zolpidem Other (See Comments)    hallucinations    Family History  Problem Relation Age of Onset   Heart disease Mother        Died 56, MR, no CAD   Heart disease Father        CHF, no CAD.[name]    HIV Daughter 40       from bone tissue transplant    Prior to Admission medications   Medication Sig Start Date End Date Taking? Authorizing Provider  acetaminophen (TYLENOL) 500 MG tablet Take 500-1,000 mg by mouth every 6 (six) hours as needed for moderate pain.    [provider]  Cholecalciferol (VITAMIN D3) 2000 units capsule Take 2,000 Units by mouth daily.     [provider]  Cyanocobalamin 1000 MCG/ML LIQD Take by mouth.    [provider]  denosumab (PROLIA) 60 MG/ML SOSY injection Inject 60 mg into the skin every 6 (six) months. 07/25/19   [provider]  diclofenac Sodium (VOLTAREN) 1 % GEL Apply  topically 4 (four) times daily.    [provider]  dicyclomine (BENTYL) 20 MG tablet Take 1 tablet (20 mg total) by mouth 2 (two) times daily. 05/03/23   Fondaw, Wylder S, PA  ELIQUIS 2.5 MG TABS tablet Take 2.5 mg by mouth 2 (two) times daily. 02/03/20   [provider]  furosemide (LASIX) 20 MG tablet Take 1 tablet (20 mg total) by mouth daily. 09/22/22   Rollene Rotunda, MD  ipratropium (ATROVENT) 0.06 % nasal spray Use 2 sprays in each nostril up to three times daily before meals for sinus drainage. Patient taking differently: Place 2 sprays into the nose daily. 09/30/16   Kimber Relic, MD  LINZESS 145 MCG CAPS capsule Take 145 mcg by mouth daily as needed (constipation). 07/29/22   [provider]  metFORMIN (GLUCOPHAGE-XR) 500 MG 24 hr tablet Take 1 tablet by mouth every evening. 02/01/22   [provider]  mirabegron ER (MYRBETRIQ) 50 MG TB24 tablet Take 1 tablet by mouth daily. 08/11/21   [provider]  MULTIPLE VITAMINS-MINERALS PO Take 1 tablet  by mouth daily.     [provider]  nitrofurantoin, macrocrystal-monohydrate, (MACROBID) 100 MG capsule Take 100 mg by mouth every 12 (twelve) hours. 01/21/23   [provider]  nitroGLYCERIN (NITROSTAT) 0.4 MG SL tablet Place 1 tablet (0.4 mg total) under the tongue every 5 (five) minutes as needed for chest pain. Place one tablet under the tongue every 5 minutes as needed for chest pain. No more than 3 08/09/22   Rollene Rotunda, MD  pantoprazole (PROTONIX) 40 MG tablet Take 40 mg by mouth 2 (two) times daily. 12/18/19   [provider]  pindolol (VISKEN) 5 MG tablet Take 1/2 (one-half) tablet by mouth twice daily 07/04/23   Rollene Rotunda, MD  Probiotic Product (PROBIOTIC DAILY PO) Take 1 capsule by mouth every evening.    [provider]  rOPINIRole (REQUIP) 0.5 MG tablet Take 0.5 mg by mouth at bedtime. 07/13/22   [provider]  rosuvastatin (CRESTOR) 10 MG  tablet TAKE 1 TABLET BY MOUTH AT BEDTIME Patient taking differently: Take 10 mg by mouth daily. 11/23/20   Maeola Harman, MD  sertraline (ZOLOFT) 25 MG tablet Take 25 mg by mouth at bedtime. 08/10/22   [provider]  traMADol (ULTRAM) 50 MG tablet TAKE 1 TABLET BY MOUTH EVERY 4 HOURS Patient taking differently: Take 50 mg by mouth every 12 (twelve) hours as needed for moderate pain or severe pain (pain). 11/14/16   Kermit Balo, DO     Physical Exam: Vitals:   07/27/23 0200 07/27/23 0300 07/27/23 0319 07/27/23 0337  BP: 128/66  (!) 141/74   Pulse: 78  74   Resp: (!) 28     Temp:  98.1 F (36.7 C)    TempSrc:      SpO2: 96%  98%   Weight:    48.4 kg  Height:        Physical Exam HENT:     Head: Normocephalic.     Nose: Nose normal.  Eyes:     Pupils: Pupils are equal, round, and reactive to light.  Cardiovascular:     Rate and Rhythm: Normal rate and regular rhythm.     Pulses: Normal pulses.     Heart sounds: Normal heart sounds.  Pulmonary:     Effort: Pulmonary effort is normal.     Breath sounds: Normal breath sounds.  Abdominal:     General: Bowel sounds are normal. There is no distension.     Palpations: There is no mass.     Tenderness: There is no abdominal tenderness. There is no rebound.  Musculoskeletal:        General: No swelling.     Cervical back: Normal range of motion and neck supple.     Right lower leg: Edema present.     Left lower leg: Edema present.     Comments: Trace bilateral lower extremity edema  Neurological:     Mental Status: She is alert and oriented to person, place, and time.  Psychiatric:        Mood and Affect: Mood normal.        Thought Content: Thought content normal.      Labs on Admission: I have personally reviewed following labs and imaging studies  CBC: Recent Labs  Lab 07/26/23 1450  WBC 7.0  HGB 11.5*  HCT 34.1*  MCV 105.2*  PLT 318   Basic Metabolic Panel: Recent Labs  Lab  07/26/23 1450  NA 132*  K 4.0  CL 96*  CO2 25  GLUCOSE 184*  BUN 36*  CREATININE 1.41*  CALCIUM 9.2   GFR: Estimated Creatinine Clearance: 20.3 mL/min (A) (by C-G formula based on SCr of 1.41 mg/dL (H)). Liver Function Tests: No results for input(s): "AST", "ALT", "ALKPHOS", "BILITOT", "PROT", "ALBUMIN" in the last 168 hours. No results for input(s): "LIPASE", "AMYLASE" in the last 168 hours. No results for input(s): "AMMONIA" in the last 168 hours. Coagulation Profile: Recent Labs  Lab 07/26/23 1450  INR 1.2   Cardiac Enzymes: Recent Labs  Lab 07/26/23 1450  TROPONINIHS 9   BNP (last 3 results) Recent Labs    08/30/22 1351 07/26/23 1450  BNP 1,103.7* 328.1*   HbA1C: No results for input(s): "HGBA1C" in the last 72 hours. CBG: No results for input(s): "GLUCAP" in the last 168 hours. Lipid Profile: No results for input(s): "CHOL", "HDL", "LDLCALC", "TRIG", "CHOLHDL", "LDLDIRECT" in the last 72 hours. Thyroid Function Tests: No results for input(s): "TSH", "T4TOTAL", "FREET4", "T3FREE", "THYROIDAB" in the last 72 hours. Anemia Panel: No results for input(s): "VITAMINB12", "FOLATE", "FERRITIN", "TIBC", "IRON", "RETICCTPCT" in the last 72 hours. Urine analysis:    Component Value Date/Time   COLORURINE YELLOW 05/03/2023 1334   APPEARANCEUR CLEAR 05/03/2023 1334   LABSPEC 1.012 05/03/2023 1334   PHURINE 6.0 05/03/2023 1334   GLUCOSEU NEGATIVE 05/03/2023 1334   HGBUR NEGATIVE 05/03/2023 1334   BILIRUBINUR NEGATIVE 05/03/2023 1334   KETONESUR NEGATIVE 05/03/2023 1334   PROTEINUR NEGATIVE 05/03/2023 1334   NITRITE NEGATIVE 05/03/2023 1334   LEUKOCYTESUR NEGATIVE 05/03/2023 1334    Radiological Exams on Admission: I have personally reviewed images CT ABDOMEN PELVIS W CONTRAST  Result Date: 07/26/2023 CLINICAL DATA:  Left lower quadrant abdominal pain. EXAM: CT ABDOMEN AND PELVIS WITH CONTRAST TECHNIQUE: Multidetector CT imaging of the abdomen and pelvis was  performed using the standard protocol following bolus administration of intravenous contrast. RADIATION DOSE REDUCTION: This exam was performed according to the departmental dose-optimization program which includes automated exposure control, adjustment of the mA and/or kV according to patient size and/or use of iterative reconstruction technique. CONTRAST:  60mL OMNIPAQUE IOHEXOL 300 MG/ML  SOLN COMPARISON:  CT abdomen and pelvis 05/03/2023 FINDINGS: Lower chest: No acute abnormality. Hepatobiliary: Vague hypodensity in the liver measuring 1.3 x 1.3 cm becomes isodense to the liver on delayed imaging most compatible with hemangioma. There is no biliary ductal dilatation or acute gallbladder abnormality. Pancreas: Unremarkable. No pancreatic ductal dilatation or surrounding inflammatory changes. Spleen: Spleen is normal in size. There are calcified granulomas in the spleen. Adrenals/Urinary Tract: There are scattered rounded hypodensities in both kidneys which are too small to characterize, likely cysts. There is no hydronephrosis or perinephric fat stranding. The adrenal glands and bladder are within normal limits. Stomach/Bowel: There severe sigmoid colon diverticulosis. There is no evidence for active inflammation. There is no bowel obstruction, pneumatosis or free air. Appendix is not seen. Small bowel and stomach are within normal limits. Vascular/Lymphatic: Aortic atherosclerosis. No enlarged abdominal or pelvic lymph nodes. Superior mesenteric artery stent is present. Reproductive: Uterus and adnexa are within normal limits. Other: No abdominal wall hernia or abnormality. No abdominopelvic ascites. Musculoskeletal: There is levoconvex curvature of the spine. Degenerative changes affect the spine, right hip and pubic symphysis. IMPRESSION: 1. No acute localizing process in the abdomen or pelvis. 2. Severe sigmoid colon diverticulosis without evidence for acute diverticulitis. Aortic Atherosclerosis  (ICD10-I70.0). Electronically Signed   By: Darliss Cheney M.D.   On: 07/26/2023 17:41   DG  Chest 2 View  Result Date: 07/26/2023 CLINICAL DATA:  History of CHF.  Swelling in the legs for 10 days. EXAM: CHEST - 2 VIEW COMPARISON:  08/30/2022 FINDINGS: Again noted is S-shaped scoliosis in the thoracic and lumbar spine. Compared to the previous chest radiograph, there is improved aeration in the lungs particularly at the right lung base. Heart size is upper limits of normal and stable. Atherosclerotic calcifications at the aortic arch. Hazy densities in both lungs but no focal airspace disease or overt pulmonary edema. Stable sclerosis or densities in the region of the right first rib. IMPRESSION: 1. No acute cardiopulmonary disease. 2. Severe scoliosis. Electronically Signed   By: Richarda Overlie M.D.   On: 07/26/2023 15:47    EKG: My personal interpretation of EKG shows: Sinus rhythm with first-degree AV block.  Heart rate 64.  There is no ST and T wave abnormality.     Assessment/Plan: Principal Problem:   Acute on chronic heart failure with preserved ejection fraction (HFpEF) (HCC) Active Problems:   AKI (acute kidney injury) on CKD stage II (HCC)   Essential hypertension   Paroxysmal atrial fibrillation (HCC)   DM (diabetes mellitus) type II controlled with renal manifestation (HCC)   Restless leg syndrome   GAD (generalized anxiety disorder)   First degree AV block    Assessment and Plan: Acute on chronic heart failure with preserved EF -Patient coming with complaining of progressive bilateral lower extremity swelling and shortness of breath.  At home patient takes Lasix 20 mg which has been increased to 60 mg daily for last 3 days.  Physical exam showed trace bilateral lower extremities edema. -Per chart review previous echocardiogram from 07/2022 showed preserved EF 60 to 65% and grade 2 diastolic heart failure. - Elevated BNP 328.  Chest x-ray no acute cardiopulmonary process. - In the  patient received Lasix 20 mg IV once. - Plan to continue Lasix 20 mg IV twice daily.  Need to assess urine output and volume status daily basis and adjust dose accordingly. - Continue strict I's/O, daily weight and monitor urine output - Continue fluid restriction 2 L/day and salt restriction 2 g/day -Obtaining echocardiogram. - Patient has been admitted for observation for management of CHF. -Continue cardiac monitoring.  First-degree AV block - EKG showed Sinus rhythm with first-degree AV block.  Heart rate 64.  There is no ST and T wave abnormality. While comparing with previous EKG from 2023 patient has prolonged PR interval now has been developing first-degree AV block likely secondary to oral beta-blocker. - Patient to pindolol 2.5 mg in the a.m. of 07/18/2023.  Holding home pindolol 2.5 mg twice daily. -Repeat EKG in the AM. If repeat EKG a.m. does not show improvement of AV block need to reach out to and consult cardiology for further evaluation.   AKI on CKD stage II -Creatinine 1.41 on presentation.  Patient's baseline creatinine is around 0.93, acute kidney injury secondary to CHF exacerbation.  Currently managing CHF with IV diuretics. - Continue to monitor urine output. - Avoid nephrotoxic agent.  Essential hypertension -Blood pressure is well-controlled.  Holding home pindolol in the setting of plastic AV block. -Continue as needed hydralazine 10 mg every 8 hours as needed for systolic blood pressure more than 160 or diastolic blood pressure more than 110.  Paroxysmal atrial fibrillation -At home patient is on pindolol 2.5 mg twice daily.  In the setting of AV block holding pindolol. -Resumed home Eliquis 2.5 mg twice daily.   Non-insulin-dependent DM  type II -Per chart review A1c 7.1 on 07/2022.  At patient takes metformin -Checking A1c. -Holding metformin in the setting of AKI - Continue low sliding scale SSI as needed with mealtime coverage and continue carb consistent  diet.  Hyperlipidemia - Continue rosuvastatin 10 mg daily  Rest left leg syndrome -Resumed home ropinirole 0.5 mg at bedtime  History of generalized anxiety disorder -Resumed home Zoloft 10 mg daily.  Chronic vertigo -Patient reported chronic vertigo and currently she under going home physical therapy.  Patient is requesting for hospital physical therapy for vertigo as well. - Consulted PT for vestibular therapy. Continue fall precaution  History of overactive bladder - Resumed home mirabegron 50 mg daily  Sigmoid diverticulosis Recent history of diverticulitis-complete antibiotic treatment - Continue probiotic 1 capsule daily. - Continue home Linzess as needed for constipation.  GERD -Continue Protonix 40 mg twice daily  DVT prophylaxis:  Eliquis Code Status:  Full Code.  Discussed CODE STATUS both with patient and patient's son at the bedside.  Both of them reported and wished to remain full code in the terms of cardiac compression, mechanical ventilation and intubation if situation arises with full scope of treatment. Diet: Heart healthy and carb modified diet. Family Communication: Discussed treatment plan patient's son at the bedside. Disposition Plan: Plan to discharge home next 1 to 2 days. Consults: Physical therapy for vestibular balance therapy. Admission status:   Observation, Telemetry bed  Severity of Illness: The appropriate patient status for this patient is OBSERVATION. Observation status is judged to be reasonable and necessary in order to provide the required intensity of service to ensure the patient's safety. The patient's presenting symptoms, physical exam findings, and initial radiographic and laboratory data in the context of their medical condition is felt to place them at decreased risk for further clinical deterioration. Furthermore, it is anticipated that the patient will be medically stable for discharge from the hospital within 2 midnights of  admission.     Tereasa Coop, MD [facility]  How to contact the [facility] Attending or Consulting provider 7A - 7P or covering provider during after hours 7P -7A, for this patient.  Check the care team in [facility] and look for a) attending/consulting [facility] provider listed and b) the [facility] team listed Log into [email or web address] and use [facility]'s universal password to access. If you do not have the password, please contact the hospital operator. Locate the [facility] provider you are looking for under [facility] and page to a number that you can be directly reached. If you still have difficulty reaching the provider, please page the [facility] (Director on Call) for the Hospitalists listed on amion for assistance.  07/27/2023, 4:05 AM

## 2023-07-27 NOTE — Progress Notes (Signed)
Heart Failure Navigator Progress Note  Assessed for Heart & Vascular TOC clinic readiness.  Patient does not meet criteria due to ef 55-60%. Has has follow up with [facility] 9/9.   Navigator will sign off at this time.  Litzi Binning,RN, BSN,MSN Heart Failure Nurse Navigator. Contact by secure chat only.

## 2023-07-27 NOTE — Progress Notes (Addendum)
Patient is  complaining about about on and off chest pain/chest pressure with associated shortness of breath for last few days, back pain with radiation to her neck which is chronic in nature.  Patient is declining nitroglycerin and Tylenol.  She is also has on and off shortness of breath.  EKG x 2 showed normal sinus rhythm with first degree AV block without any evidence of ST-T wave abnormality.  Troponin x 2 negative.   Chest pain likely noncardiac origin from her advanced scoliosis and bad posture.  Patient also reporting that she did not sleep for last 48 hours.  Requesting for some morphine only for pain. Ordered morphine 1 mg IV once.  Tereasa Coop, MD [facility] 07/27/2023, 6:12 AM

## 2023-07-27 NOTE — Progress Notes (Signed)
[facility]                                                                              Heather Olsen, is a 89 y.o. female, DOB - 05-19-1931, MVH:[id number] Admit date - 07/26/2023    Outpatient Primary MD for the patient is Merri Brunette, MD  LOS - 0  days  Chief Complaint  Patient presents with   Leg Swelling   Congestive Heart Failure       Brief summary   Patient is a 89 year old female with diastolic CHF, preserved EF, paroxysmal A-fib on Eliquis, DM type II, HTN, CKD stage II presented to ED with shortness of breath, bilateral LE edema, weight gain and abdominal pain.  Patient reported worsening shortness of breath and progressively worsening bilateral lower extremity swelling.  She had increased her Lasix from 40 mg to 60 mg for the last 3 days under her PCPs advice.  She also had recently completed a course of antibiotics for diverticulitis a day before the admission. In ED, sodium 132, creatinine 1.4, BNP 328, COVID-negative. CT abdomen pelvis showed severe sigmoid colon diverticulosis but no acute diverticulitis.  Received Lasix 20 mg IV x 1, admitted for further workup.  Assessment & Plan    Principal Problem:   Acute on chronic heart failure with preserved ejection fraction (HFpEF) (HCC) -Presented with lower extremity swelling, SOB, weight gain, BNP 328 -Previous echo 07/2022 showed EF of 60 to 65%, G2 DD, repeat 2D echo -Continue Lasix 20 mg IV twice daily, strict I's and O's and daily weights   Active problems  AKI on CKD stage II -Creatinine 1.41 on presentation, baseline 0.9-1.0 -Continue to follow renal function with IV diuresis, currently 1.3  Essential hypertension -BP controlled, resume low-dose beta-blocker -Continue hydralazine     Paroxysmal atrial fibrillation -Continue low-dose beta-blocker, -Noted first-degree AV block on EKG -Continue Eliquis 2.5 mg twice daily  Chronic upper back pain, neck pain -Resumed tramadol, continue  Tylenol as needed -Per patient, she is getting ESI injections for pain -Currently no radiculopathy, however significant pain since yesterday, will place on prednisone 40 mg daily for 5 days and then outpatient follow-up with her PCP regarding the injection     Non-insulin-dependent DM type II - outpatient on metformin, will hold. -Hemoglobin A1c 7.5 -Continue sliding scale insulin while inpatient    Hyperlipidemia - Continue rosuvastatin 10 mg daily   Rest left leg syndrome -Continue ropinirole 0.5 mg at bedtime   History of generalized anxiety disorder -Continue Zoloft 10 mg daily   Chronic vertigo -Patient reported chronic vertigo and undergoing PT outpatient -Continue fall precautions    History of overactive bladder -Continue home mirabegron 50 mg daily   Sigmoid diverticulosis Recent history of diverticulitis-complete antibiotic treatment - Continue probiotic 1 capsule daily. - Continue Linzess daily -CT abdomen showed severe sigmoid diverticulosis but no acute diverticulitis.   GERD -Continue protonix   Estimated body mass index is 18.32 kg/m as calculated from the following:   Height as of this encounter: 5\' 4"  (1.626 m).   Weight as of this encounter: 48.4 kg.  Code Status: Full code DVT Prophylaxis:  apixaban (ELIQUIS) tablet 2.5 mg Start: 07/27/23 0415 SCDs Start: 07/27/23 0321 apixaban (ELIQUIS) tablet 2.5 mg   Level of Care: Level of care: Telemetry Cardiac Family Communication: Updated patient Disposition Plan:      Remains inpatient appropriate: Likely DC home tomorrow, PT OT today   Procedures:    Consultants:   None  Antimicrobials:   Anti-infectives (From admission, onward)    None          Medications  acidophilus  1 capsule Oral QPM   apixaban  2.5 mg Oral BID   furosemide  20 mg Intravenous BID   insulin aspart  0-6 Units Subcutaneous TID WC   linaclotide  145 mcg Oral QAC breakfast   mirabegron ER  50 mg Oral Daily    pantoprazole  40 mg Oral BID   predniSONE  40 mg Oral QAC breakfast   rOPINIRole  0.5 mg Oral QHS   rosuvastatin  10 mg Oral QHS   sertraline  50 mg Oral QHS   traMADol  50 mg Oral Q12H   traZODone  50 mg Oral QHS      Subjective:   Heather Olsen was seen and examined today.  States overnight had significant pain in her neck and head, typical of cervical spinal stenosis, receives the steroid injections outpatient.  Could not sleep well.   Patient denies chest pain, shortness of breath, abdominal pain, N/V/D/C, new weakness, numbess, tingling.   Objective:   Vitals:   07/27/23 0300 07/27/23 0319 07/27/23 0337 07/27/23 0730  BP:  (!) 141/74  (!) 141/91  Pulse:  74  79  Resp:    19  Temp: 98.1 F (36.7 C)   98.2 F (36.8 C)  TempSrc:    Oral  SpO2:  98%  97%  Weight:   48.4 kg   Height:        Intake/Output Summary (Last 24 hours) at 07/27/2023 1120 Last data filed at 07/27/2023 0853 Gross per 24 hour  Intake 200 ml  Output 650 ml  Net -450 ml     Wt Readings from Last 3 Encounters:  07/27/23 48.4 kg  04/05/23 48.5 kg  03/30/23 49 kg     Exam General: Alert and oriented x 3, NAD Cardiovascular: S1 S2 auscultated,  RRR Respiratory: Clear to auscultation bilaterally, no wheezing Gastrointestinal: Soft, nontender, nondistended, + bowel sounds Ext: trace pedal edema bilaterally Neuro: Strength 5/5 upper and lower extremities bilaterally Skin: No rashes Psych: Normal affect     Data Reviewed:  I have personally reviewed following labs    CBC Lab Results  Component Value Date   WBC 7.1 07/27/2023   RBC 3.16 (L) 07/27/2023   HGB 11.1 (L) 07/27/2023   HCT 31.8 (L) 07/27/2023   MCV 100.6 (H) 07/27/2023   MCH 35.1 (H) 07/27/2023   PLT 334 07/27/2023   MCHC 34.9 07/27/2023   RDW 12.1 07/27/2023   LYMPHSABS 1.5 08/31/2022   MONOABS 1.0 08/31/2022   EOSABS 0.1 08/31/2022   BASOSABS 0.0 08/31/2022     Last metabolic panel Lab Results  Component Value  Date   NA 133 (L) 07/27/2023   K 3.5 07/27/2023   CL 97 (L) 07/27/2023   CO2 24 07/27/2023   BUN 29 (H) 07/27/2023   CREATININE 1.30 (H) 07/27/2023   GLUCOSE 211 (H) 07/27/2023   GFRNONAA 39 (L) 07/27/2023   GFRAA >60 07/23/2020   CALCIUM 8.9 07/27/2023   PHOS 1.8 (L) 08/28/2022   PROT 6.2 (L) 07/27/2023  ALBUMIN 3.4 (L) 07/27/2023   BILITOT 0.3 07/27/2023   ALKPHOS 74 07/27/2023   AST 27 07/27/2023   ALT 18 07/27/2023   ANIONGAP 12 07/27/2023    CBG (last 3)  Recent Labs    07/27/23 0605  GLUCAP 253*      Coagulation Profile: Recent Labs  Lab 07/26/23 1450  INR 1.2     Radiology Studies: I have personally reviewed the imaging studies  CT ABDOMEN PELVIS W CONTRAST  Result Date: 07/26/2023 CLINICAL DATA:  Left lower quadrant abdominal pain. EXAM: CT ABDOMEN AND PELVIS WITH CONTRAST TECHNIQUE: Multidetector CT imaging of the abdomen and pelvis was performed using the standard protocol following bolus administration of intravenous contrast. RADIATION DOSE REDUCTION: This exam was performed according to the departmental dose-optimization program which includes automated exposure control, adjustment of the mA and/or kV according to patient size and/or use of iterative reconstruction technique. CONTRAST:  60mL OMNIPAQUE IOHEXOL 300 MG/ML  SOLN COMPARISON:  CT abdomen and pelvis 05/03/2023 FINDINGS: Lower chest: No acute abnormality. Hepatobiliary: Vague hypodensity in the liver measuring 1.3 x 1.3 cm becomes isodense to the liver on delayed imaging most compatible with hemangioma. There is no biliary ductal dilatation or acute gallbladder abnormality. Pancreas: Unremarkable. No pancreatic ductal dilatation or surrounding inflammatory changes. Spleen: Spleen is normal in size. There are calcified granulomas in the spleen. Adrenals/Urinary Tract: There are scattered rounded hypodensities in both kidneys which are too small to characterize, likely cysts. There is no hydronephrosis or  perinephric fat stranding. The adrenal glands and bladder are within normal limits. Stomach/Bowel: There severe sigmoid colon diverticulosis. There is no evidence for active inflammation. There is no bowel obstruction, pneumatosis or free air. Appendix is not seen. Small bowel and stomach are within normal limits. Vascular/Lymphatic: Aortic atherosclerosis. No enlarged abdominal or pelvic lymph nodes. Superior mesenteric artery stent is present. Reproductive: Uterus and adnexa are within normal limits. Other: No abdominal wall hernia or abnormality. No abdominopelvic ascites. Musculoskeletal: There is levoconvex curvature of the spine. Degenerative changes affect the spine, right hip and pubic symphysis. IMPRESSION: 1. No acute localizing process in the abdomen or pelvis. 2. Severe sigmoid colon diverticulosis without evidence for acute diverticulitis. Aortic Atherosclerosis (ICD10-I70.0). Electronically Signed   By: Darliss Cheney M.D.   On: 07/26/2023 17:41   DG Chest 2 View  Result Date: 07/26/2023 CLINICAL DATA:  History of CHF.  Swelling in the legs for 10 days. EXAM: CHEST - 2 VIEW COMPARISON:  08/30/2022 FINDINGS: Again noted is S-shaped scoliosis in the thoracic and lumbar spine. Compared to the previous chest radiograph, there is improved aeration in the lungs particularly at the right lung base. Heart size is upper limits of normal and stable. Atherosclerotic calcifications at the aortic arch. Hazy densities in both lungs but no focal airspace disease or overt pulmonary edema. Stable sclerosis or densities in the region of the right first rib. IMPRESSION: 1. No acute cardiopulmonary disease. 2. Severe scoliosis. Electronically Signed   By: Richarda Overlie M.D.   On: 07/26/2023 15:47       Carley Glendenning M.D. [facility] 07/27/2023, 11:20 AM  Available via Epic secure chat 7am-7pm After 7 pm, please refer to night coverage provider listed on amion.

## 2023-07-27 NOTE — ED Notes (Signed)
[facility] at bedside to transport pt to [place] 

## 2023-07-27 NOTE — Progress Notes (Signed)
Pt c/o chronic neck pain 7/10 that is in the middle of the back of her neck and radiated to the back of her head. Pt stated she has had this off and on for several months.  Pt also stated she has began having chest pressure in the center of her chest, non-radiating. PT denied any new sob, stating she has had shortness of breath x10 days. Pt denied any weakness, denied any numbness or tingling. Chest pressure did not change with palpation. 12-lead obtained and MD for [facility] paged. Pt refused nitroglycerine. Pt requesting morphine.

## 2023-07-27 NOTE — Progress Notes (Signed)
Mobility Specialist Progress Note:    07/27/23 1634  Mobility  Activity Ambulated with assistance in hallway  Level of Assistance Contact guard assist, steadying assist  Assistive Device Front wheel walker  Distance Ambulated (ft) 80 ft  Activity Response Tolerated well  Mobility Referral Yes  $Mobility charge 1 Mobility  Mobility Specialist Start Time (ACUTE ONLY) 1615  Mobility Specialist Stop Time (ACUTE ONLY) 1633  Mobility Specialist Time Calculation (min) (ACUTE ONLY) 18 min   Received pt in bed having no complaints and agreeable to mobility. Pt was asymptomatic throughout ambulation and returned to room w/o fault. Left in bed w/ call bell in reach and all needs met.   Thompson Grayer Mobility Specialist  Please contact vis Secure Chat or  Rehab Office [phone]

## 2023-07-27 NOTE — Progress Notes (Signed)
  Echocardiogram 2D Echocardiogram has been performed.  Delcie Roch 07/27/2023, 11:59 AM

## 2023-07-28 DIAGNOSIS — I10 Essential (primary) hypertension: Secondary | ICD-10-CM | POA: Diagnosis not present

## 2023-07-28 DIAGNOSIS — I509 Heart failure, unspecified: Secondary | ICD-10-CM | POA: Diagnosis not present

## 2023-07-28 DIAGNOSIS — I5033 Acute on chronic diastolic (congestive) heart failure: Secondary | ICD-10-CM | POA: Diagnosis not present

## 2023-07-28 DIAGNOSIS — E1121 Type 2 diabetes mellitus with diabetic nephropathy: Secondary | ICD-10-CM | POA: Diagnosis not present

## 2023-07-28 LAB — COMPREHENSIVE METABOLIC PANEL
ALT: 15 U/L (ref 0–44)
AST: 23 U/L (ref 15–41)
Albumin: 3.5 g/dL (ref 3.5–5.0)
Alkaline Phosphatase: 67 U/L (ref 38–126)
Anion gap: 11 (ref 5–15)
BUN: 29 mg/dL — ABNORMAL HIGH (ref 8–23)
CO2: 28 mmol/L (ref 22–32)
Calcium: 8.5 mg/dL — ABNORMAL LOW (ref 8.9–10.3)
Chloride: 94 mmol/L — ABNORMAL LOW (ref 98–111)
Creatinine, Ser: 1.32 mg/dL — ABNORMAL HIGH (ref 0.44–1.00)
GFR, Estimated: 38 mL/min — ABNORMAL LOW (ref >60)
Glucose, Bld: 138 mg/dL — ABNORMAL HIGH (ref 70–99)
Potassium: 3.3 mmol/L — ABNORMAL LOW (ref 3.5–5.1)
Sodium: 133 mmol/L — ABNORMAL LOW (ref 135–145)
Total Bilirubin: 0.6 mg/dL (ref 0.3–1.2)
Total Protein: 6.1 g/dL — ABNORMAL LOW (ref 6.5–8.1)

## 2023-07-28 LAB — CBC
HCT: 32.7 % — ABNORMAL LOW (ref 36.0–46.0)
Hemoglobin: 11 g/dL — ABNORMAL LOW (ref 12.0–15.0)
MCH: 34.6 pg — ABNORMAL HIGH (ref 26.0–34.0)
MCHC: 33.6 g/dL (ref 30.0–36.0)
MCV: 102.8 fL — ABNORMAL HIGH (ref 80.0–100.0)
Platelets: 345 10*3/uL (ref 150–400)
RBC: 3.18 MIL/uL — ABNORMAL LOW (ref 3.87–5.11)
RDW: 12.1 % (ref 11.5–15.5)
WBC: 7.7 10*3/uL (ref 4.0–10.5)
nRBC: 0 % (ref 0.0–0.2)

## 2023-07-28 LAB — GLUCOSE, CAPILLARY
Glucose-Capillary: 128 mg/dL — ABNORMAL HIGH (ref 70–99)
Glucose-Capillary: 230 mg/dL — ABNORMAL HIGH (ref 70–99)

## 2023-07-28 MED ORDER — ONDANSETRON HCL 4 MG PO TABS: 4.0000 mg | ORAL_TABLET | Freq: Four times a day (QID) | ORAL | 0 refills | Status: DC | PRN

## 2023-07-28 MED ORDER — PREDNISONE 20 MG PO TABS: 40.0000 mg | ORAL_TABLET | Freq: Every day | ORAL | 0 refills | Status: AC

## 2023-07-28 MED ORDER — FUROSEMIDE 40 MG PO TABS: 40.0000 mg | ORAL_TABLET | Freq: Every day | ORAL | 3 refills | Status: DC

## 2023-07-28 MED ORDER — POTASSIUM CHLORIDE CRYS ER 20 MEQ PO TBCR
40.0000 meq | EXTENDED_RELEASE_TABLET | Freq: Once | ORAL | Status: AC
  Administered 2023-07-28: 40 meq via ORAL
  Filled 2023-07-28: qty 2

## 2023-07-28 NOTE — Progress Notes (Addendum)
Physical Therapy Treatment Patient Details Name: Heather Olsen MRN: [id number] DOB: [birth year] Today's Date: 07/28/2023   History of Present Illness Patient is a 89 year old female admitted 8/28 with shortness of breath, bilateral LE edema, weight gain and abdominal pain.  W/u for CHF exacerbation.  Also with vertigo of which she had been going to Outpt PT.  PMH: diastolic CHF, preserved EF, paroxysmal A-fib on Eliquis, DM type II, HTN, CKD stage II    PT Comments  Pt admitted with above diagnosis. Pt tolerated BBQ roll for left horizontal canal BPPV.  Pt reports some relief from symptoms post treatment with only slight dizziness after. Determined to come back in one hour to reassess pt and see if another treatment should be done prior to d/c home.   Pt currently with functional limitations due to the deficits listed below (see PT Problem List). Pt will benefit from acute skilled PT to increase their independence and safety with mobility to allow discharge.       If plan is discharge home, recommend the following:     Can travel by private vehicle        Equipment Recommendations  None recommended by PT    Recommendations for Other Services       Precautions / Restrictions Precautions Precautions: Fall Restrictions Weight Bearing Restrictions: No     Mobility  Bed Mobility Overal bed mobility: Needs Assistance Bed Mobility: Rolling, Sidelying to Sit, Sit to Supine Rolling: Contact guard assist, Min assist Sidelying to sit: Min assist   Sit to supine: Min assist   General bed mobility comments: Suspicious for left posterior canal BPPV therefore performed Epley for left posterior canal BPPV as suspicious that crystals moved to that canal.   Pt needed min assist for bed mobility as she was dizzy.    Transfers Overall transfer level: Needs assistance Equipment used: Rolling walker (2 wheels) Transfers: Sit to/from Stand Sit to Stand: Contact guard assist           General  transfer comment: cues for hand placement and steadying assist neded due to slight vertigo.    Ambulation/Gait Ambulation/Gait assistance: Contact guard assist Gait Distance (Feet): 40 Feet Assistive device: Rolling walker (2 wheels) Gait Pattern/deviations: Step-through pattern, Decreased stride length       General Gait Details: Slow and cautious gait but no LOB with RW use. Pt agrees that she would be safer using device initially.   Stairs             Wheelchair Mobility     Tilt Bed    Modified Rankin (Stroke Patients Only)       Balance Overall balance assessment: Needs assistance Sitting-balance support: No upper extremity supported, Feet supported Sitting balance-Leahy Scale: Fair     Standing balance support: Bilateral upper extremity supported, During functional activity Standing balance-Leahy Scale: Poor Standing balance comment: needs bil UE support                            Cognition Arousal: Alert Behavior During Therapy: WFL for tasks assessed/performed Overall Cognitive Status: Within Functional Limits for tasks assessed                                          Exercises Other Exercises Other Exercises: Educated in x 1 exercises but pt aware to not begin these until  she is sure that BPPV is clear.  Also gave this so that HHPT will have progression of activity for pts.  Gave exercise program from [facility] [place]    General Comments General comments (skin integrity, edema, etc.): VSS      Pertinent Vitals/Pain Pain Assessment Pain Assessment: Faces Faces Pain Scale: Hurts whole lot Pain Location: neck and head Pain Descriptors / Indicators: Aching, Discomfort, Grimacing, Guarding Pain Intervention(s): Limited activity within patient's tolerance, Monitored during session, Repositioned, Patient requesting pain meds-RN notified    Home Living                          Prior Function             PT Goals (current goals can now be found in the care plan section) Acute Rehab PT Goals Patient Stated Goal: to go home Progress towards PT goals: Progressing toward goals    Frequency    Min 1X/week      PT Plan      Co-evaluation              AM-PAC PT "6 Clicks" Mobility   Outcome Measure  Help needed turning from your back to your side while in a flat bed without using bedrails?: None Help needed moving from lying on your back to sitting on the side of a flat bed without using bedrails?: A Little Help needed moving to and from a bed to a chair (including a wheelchair)?: A Little Help needed standing up from a chair using your arms (e.g., wheelchair or bedside chair)?: A Little Help needed to walk in hospital room?: A Little Help needed climbing 3-5 steps with a railing? : A Little 6 Click Score: 19    End of Session Equipment Utilized During Treatment: Gait belt Activity Tolerance: Patient limited by fatigue Patient left: in bed;with call bell/phone within reach;with bed alarm set Nurse Communication: Mobility status PT Visit Diagnosis: Muscle weakness (generalized) (M62.81);Other abnormalities of gait and mobility (R26.89)     Time: 0981-1914 PT Time Calculation (min) (ACUTE ONLY): 35 min  Charges:    $Gait Training: 8-22 mins  $Canalith Rep Proc: 8-22 mins PT General Charges $$ ACUTE PT VISIT: 1 Visit                     Cleotis Sparr M,PT Acute Rehab Services [phone]    Bevelyn Buckles 07/28/2023, 1:28 PM

## 2023-07-28 NOTE — TOC Initial Note (Signed)
Transition of Care [facility]) - Initial/Assessment Note    Patient Details  Name: Heather Olsen MRN: [id number] Date of Birth: [birth year]  [facility] CM/SW Contact:    Leone Haven, RN Phone Number: 07/28/2023, 11:01 AM  Clinical Narrative:                 From home alone, has PCP and insurance on file, states has no HH services in place at this time, she has walker and a cane.  Per pt eval rec HHPT, she states she will do this onsite with [facility] rehab she said  to contact [place] at [phone].  NCM called Loraine Leriche he said he will need order for eval and treat for PT.   States family member will transport her home at [employer] and family is support system, states gets medications from [place] on [address].  Pta ambulatory with walker.  Expected Discharge Plan: Home w Home Health Services Barriers to Discharge: No Barriers Identified   Patient Goals and CMS Choice Patient states their goals for this hospitalization and ongoing recovery are:: return home   Choice offered to / list presented to : NA      Expected Discharge Plan and Services In-house Referral: NA Discharge Planning Services: CM Consult Post Acute Care Choice: Home Health Living arrangements for the past 2 months: Independent Living Facility Expected Discharge Date: 07/28/23               DME Arranged: N/A DME Agency: NA       HH Arranged: PT HH Agency:  Management consultant Rehab onsite) Date HH Agency Contacted: 07/28/23 Time HH Agency Contacted: 1101 Representative spoke with at [facility] Agency: Mark [phone]  Prior Living Arrangements/Services Living arrangements for the past 2 months: Independent Living Facility Lives with:: Self Patient language and need for interpreter reviewed:: Yes Do you feel safe going back to the place where you live?: Yes      Need for Family Participation in Patient Care: Yes (Comment) Care giver support system in place?: Yes (comment) Current home services: DME (has a cane and a  walker) Criminal Activity/Legal Involvement Pertinent to Current Situation/Hospitalization: No - Comment as needed  Activities of Daily Living Home Assistive Devices/Equipment: Cane (specify quad or straight) ADL Screening (condition at time of admission) Patient's cognitive ability adequate to safely complete daily activities?: Yes Is the patient deaf or have difficulty hearing?: Yes Does the patient have difficulty seeing, even when wearing glasses/contacts?: No Does the patient have difficulty concentrating, remembering, or making decisions?: No Patient able to express need for assistance with ADLs?: Yes Does the patient have difficulty dressing or bathing?: No Independently performs ADLs?: Yes (appropriate for developmental age) Does the patient have difficulty walking or climbing stairs?: No Weakness of Legs: None Weakness of Arms/Hands: None  Permission Sought/Granted Permission sought to share information with : Case Manager Permission granted to share information with : Yes, Verbal Permission Granted     Permission granted to share info w AGENCY: [facility]        Emotional Assessment Appearance:: Appears stated age Attitude/Demeanor/Rapport: Engaged Affect (typically observed): Appropriate Orientation: : Oriented to Self, Oriented to Place, Oriented to  Time, Oriented to Situation Alcohol / Substance Use: Not Applicable Psych Involvement: No (comment)  Admission diagnosis:  Congestive heart failure, unspecified HF chronicity, unspecified heart failure type (HCC) [I50.9] Acute on chronic heart failure with preserved ejection fraction (HFpEF) (HCC) [I50.33] Patient Active Problem List   Diagnosis Date Noted  AKI (acute kidney injury) on CKD stage II (HCC) 07/27/2023   GAD (generalized anxiety disorder) 07/27/2023   First degree AV block 07/27/2023   Acute on chronic heart failure with preserved ejection fraction (HFpEF) (HCC) 07/26/2023   CHF (congestive heart  failure) (HCC) 08/31/2022   Acute on chronic diastolic CHF (congestive heart failure) (HCC) 08/30/2022   Acute diverticulitis 08/25/2022   Chronic diastolic CHF (congestive heart failure) (HCC) 08/25/2022   Unintentional weight loss 08/25/2022   Metatarsalgia, left foot 02/25/2022   Anxiety 01/25/2022   Calcium pyrophosphate arthropathy of multiple sites 01/25/2022   Chronic insomnia 01/25/2022   Easy bruising 01/25/2022   Encounter for general adult medical examination without abnormal findings 01/25/2022   Gastroesophageal reflux disease 01/25/2022   History of depression 01/25/2022   History of diverticulitis 01/25/2022   Hyperglycemia due to type 2 diabetes mellitus (HCC) 01/25/2022   Irritable bowel syndrome with diarrhea 01/25/2022   Long term (current) use of anticoagulants - on Eliquis for PAF 01/25/2022   Long term (current) use of aspirin 01/25/2022   Memory loss 01/25/2022   Constipation 01/25/2022   Night sweats 01/25/2022   Nocturia 01/25/2022   Other idiopathic scoliosis, thoracolumbar region 01/25/2022   Other specified postprocedural states 01/25/2022   Pain in thoracic spine 01/25/2022   Presence of coronary angioplasty implant and graft 01/25/2022   Primary thrombophilia (HCC) 01/25/2022   Pseudogout 01/25/2022   Pure hypercholesterolemia 01/25/2022   Raynaud's phenomenon 01/25/2022   Recurrent major depression in remission (HCC) 01/25/2022   Restless leg syndrome 01/25/2022   Senile hyperkeratosis 01/25/2022   Trochanteric bursitis of left hip 01/25/2022   Dermatochalasia 09/28/2021   DM (diabetes mellitus) type II controlled with renal manifestation (HCC) 08/05/2021   Pulmonary HTN (HCC) 04/11/2021   Paroxysmal atrial fibrillation (HCC) 05/31/2020   Dyspnea 12/29/2019   Degeneration of lumbar intervertebral disc 06/25/2019   Degenerative spondylolisthesis 06/25/2019   Osteopenia 06/25/2019   Atherosclerotic heart disease of native coronary artery without  angina pectoris 03/01/2019   Leg swelling 03/01/2019   Dyslipidemia 03/01/2019   Bilateral carpal tunnel syndrome 03/27/2017   Urgency of micturition 10/11/2016   Hyperglycemia 09/14/2016   Tricuspid insufficiency 06/27/2016   Arteriosclerosis of coronary artery 04/12/2016   HLD (hyperlipidemia) 04/12/2016   Major depression 04/12/2016   Idiopathic scoliosis 04/12/2016   Spinal stenosis of lumbar region 04/12/2016   Aortic stenosis 04/12/2016   Mitral regurgitation 04/12/2016   Pernicious anemia 04/12/2016   Hearing loss 04/12/2016   Glaucoma 04/12/2016   Palpitations 04/12/2016   Hiatal hernia 04/12/2016   Back pain 04/12/2016   Osteoporosis, senile 04/12/2016   Anemia due to vitamin B12 deficiency 04/12/2016   Raynaud's disease 04/12/2016   Balance disorder 04/12/2016   Beat, premature ventricular 01/08/2016   APC (atrial premature contractions) 06/16/2014   Fatigue 06/17/2013   Essential hypertension 06/17/2013   PCP:  Merri Brunette, MD Pharmacy:   [facility] [address] [place] - [address] [place] [address] Phone: [phone] Fax: [phone]  [facility] [address] [place] [address] Phone: [phone] Fax: [phone]     Social Determinants of Health (SDOH) Social History: SDOH Screenings   Food Insecurity: No Food Insecurity (07/27/2023)  Housing: Patient Declined (07/27/2023)  Transportation Needs: No Transportation Needs (07/27/2023)  Utilities: Not At Risk (07/27/2023)  Tobacco Use: Low Risk  (07/27/2023)   SDOH Interventions:     Readmission Risk Interventions  No data to display

## 2023-07-28 NOTE — Discharge Summary (Signed)
Physician Discharge Summary   Patient: Heather Olsen MRN: [id number] DOB: 10-25-1931  Admit date:     07/26/2023  Discharge date: 07/28/23  Discharge Physician: Thad Ranger, MD    PCP: Merri Brunette, MD   Recommendations at discharge:   Started on Lasix 40 mg daily Follow bmet in 1 week  Discharge Diagnoses:    Acute on chronic heart failure with preserved ejection fraction (HFpEF) (HCC)   AKI (acute kidney injury) on CKD stage II (HCC)   Essential hypertension   Paroxysmal atrial fibrillation (HCC)   DM (diabetes mellitus) type II controlled with renal manifestation (HCC)   Restless leg syndrome   GAD (generalized anxiety disorder)   First degree AV block    Hospital Course:  Patient is a 89 year old female with diastolic CHF, preserved EF, paroxysmal A-fib on Eliquis, DM type II, HTN, CKD stage II presented to ED with shortness of breath, bilateral LE edema, weight gain and abdominal pain.  Patient reported worsening shortness of breath and progressively worsening bilateral lower extremity swelling.  She had increased her Lasix from 40 mg to 60 mg for the last 3 days under her PCPs advice.  She also had recently completed a course of antibiotics for diverticulitis a day before the admission. In ED, sodium 132, creatinine 1.4, BNP 328, COVID-negative. CT abdomen pelvis showed severe sigmoid colon diverticulosis but no acute diverticulitis.   Received Lasix 20 mg IV x 1, admitted for further workup.   Assessment and Plan:  Acute on chronic heart failure with preserved ejection fraction (HFpEF) (HCC) -Presented with lower extremity swelling, SOB, weight gain, BNP 328 -Patient was placed on Lasix IV 20 mg twice daily -2D echo showed EF of 55 to 60% with G2 DD -Negative balance of 570 cc, feels symptomatically improved, transition to oral Lasix 40 mg daily -Outpatient follow-up with her cardiologist, Dr. Antoine Poche scheduled -Follow bmet in 1 week       AKI on CKD stage  II -Creatinine 1.41 on presentation, baseline 0.9-1.0 -Creatinine 1.3 at the time of discharge   Essential hypertension -BP controlled, resume low-dose beta-blocker     Paroxysmal atrial fibrillation -Rate controlled -Continue Eliquis 2.5 mg twice daily   Chronic upper back pain, neck pain -Resumed tramadol, continue Tylenol as needed -Per patient, she is getting ESI injections for pain -Currently no radiculopathy, but due to pain, placed on prednisone 40 mg daily for 5 days and then outpatient follow-up with her PCP regarding the injection      Non-insulin-dependent DM type II -Hemoglobin A1c 7.5 -Resume metformin at discharge   Hyperlipidemia - Continue rosuvastatin 10 mg daily   Rest left leg syndrome -Continue ropinirole 0.5 mg at bedtime   History of generalized anxiety disorder -Continue Zoloft 25 mg daily   Chronic vertigo -Patient reported chronic vertigo and undergoing PT outpatient -Continue fall precautions     History of overactive bladder -Continue home mirabegron 50 mg daily   Sigmoid diverticulosis Recent history of diverticulitis-complete antibiotic treatment - Continue probiotic 1 capsule daily. - Continue Linzess daily -CT abdomen showed severe sigmoid diverticulosis but no acute diverticulitis.   GERD -Continue protonix     Estimated body mass index is 18.32 kg/m as calculated from the following:   Height as of this encounter: 5\' 4"  (1.626 m).   Weight as of this encounter: 48.4 kg.       Pain control - [employer] Controlled Substance Reporting System database was reviewed. and patient was instructed, not to drive, operate heavy  machinery, perform activities at heights, swimming or participation in water activities or provide baby-sitting services while on Pain, Sleep and Anxiety Medications; until their outpatient Physician has advised to do so again. Also recommended to not to take more than prescribed Pain, Sleep and Anxiety  Medications.  Consultants: None Procedures performed: 2D echo Disposition: Home Diet recommendation:  Discharge Diet Orders (From admission, onward)     Start     Ordered   07/28/23 0000  Diet Carb Modified        07/28/23 1050            DISCHARGE MEDICATION: Allergies as of 07/28/2023       Reactions   Sulfasalazine Other (See Comments)   Other reaction(s): Other (See Comments), headaches, headaches, headaches   Topiramate Rash   Ace Inhibitors Cough   Codeine Nausea Only   Ibandronic Acid Nausea Only   Meloxicam    Other reaction(s): GI upset, GI Upset (intolerance), Other (See Comments) unknown   Prevacid [lansoprazole]    Chest pain   Sulfa Antibiotics Other (See Comments)   headaches   Verapamil Other (See Comments)   constipation   Zolpidem Other (See Comments)   hallucinations        Medication List     TAKE these medications    acetaminophen 500 MG tablet Commonly known as: TYLENOL Take 500-1,000 mg by mouth every 6 (six) hours as needed for moderate pain.   dicyclomine 20 MG tablet Commonly known as: BENTYL Take 1 tablet (20 mg total) by mouth 2 (two) times daily.   Eliquis 2.5 MG Tabs tablet Generic drug: apixaban Take 2.5 mg by mouth 2 (two) times daily.   furosemide 40 MG tablet Commonly known as: LASIX Take 1 tablet (40 mg total) by mouth daily. What changed:  medication strength how much to take   ipratropium 0.06 % nasal spray Commonly known as: ATROVENT Use 2 sprays in each nostril up to three times daily before meals for sinus drainage. What changed:  how much to take how to take this when to take this additional instructions   Linzess 145 MCG Caps capsule Generic drug: linaclotide Take 145 mcg by mouth daily as needed (constipation).   metFORMIN 500 MG 24 hr tablet Commonly known as: GLUCOPHAGE-XR Take 1,000 mg by mouth every evening.   mirabegron ER 50 MG Tb24 tablet Commonly known as: MYRBETRIQ Take 50 mg by  mouth daily.   MULTIPLE VITAMINS-MINERALS PO Take 1 tablet by mouth daily.   nitroGLYCERIN 0.4 MG SL tablet Commonly known as: NITROSTAT Place 1 tablet (0.4 mg total) under the tongue every 5 (five) minutes as needed for chest pain. Place one tablet under the tongue every 5 minutes as needed for chest pain. No more than 3   ondansetron 4 MG tablet Commonly known as: ZOFRAN Take 1 tablet (4 mg total) by mouth every 6 (six) hours as needed for nausea or vomiting.   pantoprazole 40 MG tablet Commonly known as: PROTONIX Take 40 mg by mouth 2 (two) times daily.   pindolol 5 MG tablet Commonly known as: VISKEN Take 1/2 (one-half) tablet by mouth twice daily What changed: See the new instructions.   predniSONE 20 MG tablet Commonly known as: DELTASONE Take 2 tablets (40 mg total) by mouth daily before breakfast for 4 days. Start taking on: July 29, 2023   PROBIOTIC DAILY PO Take 1 capsule by mouth every evening.   Prolia 60 MG/ML Sosy injection Generic drug: denosumab Inject 60  mg into the skin every 6 (six) months.   rOPINIRole 0.5 MG tablet Commonly known as: REQUIP Take 0.5 mg by mouth at bedtime.   rosuvastatin 10 MG tablet Commonly known as: CRESTOR TAKE 1 TABLET BY MOUTH AT BEDTIME What changed: when to take this   sertraline 25 MG tablet Commonly known as: ZOLOFT Take 25 mg by mouth at bedtime.   traMADol 50 MG tablet Commonly known as: ULTRAM TAKE 1 TABLET BY MOUTH EVERY 4 HOURS What changed: See the new instructions.   Vitamin D3 50 MCG (2000 UT) capsule Take 2,000 Units by mouth daily.   Voltaren 1 % Gel Generic drug: diclofenac Sodium Apply topically 4 (four) times daily.        Follow-up Information     Merri Brunette, MD. Go on 08/01/2023.   Specialty: Internal Medicine Why: for hospital follow-up @9 :00 Contact information: [address] [place] [address] [phone]         Rollene Rotunda, MD. Schedule an  appointment as soon as possible for a visit on 08/08/2023.   Specialty: Cardiology Why: at 3:00 PM for hospital follow-up Contact information: [address] [place] [address] [phone]         [facility] Follow up.   Why: for PT for vertigo Contact information: [phone]               Discharge Exam: Filed Weights   07/26/23 1445 07/27/23 0337 07/28/23 0423  Weight: 48.5 kg 48.4 kg 48.4 kg   S: Sitting up in the chair, feels a lot better today, worked with physical therapy yesterday and today.  Feels ready to go home  BP 126/74 (BP Location: Right Arm)   Pulse 64   Temp 97.6 F (36.4 C) (Axillary)   Resp 16   Ht 5\' 4"  (1.626 m)   Wt 48.4 kg   SpO2 96%   BMI 18.32 kg/m   Physical Exam General: Alert and oriented x 3, NAD Cardiovascular: S1 S2 clear, RRR.  Respiratory: CTAB, no wheezing, rales or rhonchi Gastrointestinal: Soft, nontender, nondistended, NBS Ext: no pedal edema bilaterally Neuro: no new deficits Psych: Normal affect, pleasant   Condition at discharge: fair  The results of significant diagnostics from this hospitalization (including imaging, microbiology, ancillary and laboratory) are listed below for reference.   Imaging Studies: ECHOCARDIOGRAM COMPLETE  Result Date: 07/27/2023    ECHOCARDIOGRAM REPORT   Patient Name:   [facility] Date of Exam: 07/27/2023 Medical Rec #:  [id number]   Height:       64.0 in Accession #:    [id number]  Weight:       106.7 lb Date of Birth:  March 06, 1931   BSA:          1.498 m Patient Age:    89 years    BP:           159/76 mmHg Patient Gender: F           HR:           62 bpm. Exam Location:  Inpatient Procedure: 2D Echo Indications:    acute diastolic chf  History:        Patient has prior history of Echocardiogram examinations, most                 recent 08/23/2022. CHF, Raynauds, Arrythmias:first degree AV  degree; Risk Factors:Hypertension, Dyslipidemia and Diabetes.   Sonographer:    Delcie Roch RDCS Referring Phys: [id number] SUBRINA SUNDIL IMPRESSIONS  1. Left ventricular ejection fraction, by estimation, is 55 to 60%. The left ventricle has normal function. The left ventricle has no regional wall motion abnormalities. Left ventricular diastolic parameters are consistent with Grade II diastolic dysfunction (pseudonormalization).  2. Right ventricular systolic function is normal. The right ventricular size is mildly enlarged. There is moderately elevated pulmonary artery systolic pressure. The estimated right ventricular systolic pressure is 46.6 mmHg.  3. Left atrial size was mildly dilated.  4. A small pericardial effusion is present.  5. The mitral valve is grossly normal. Mild mitral valve regurgitation. No evidence of mitral stenosis.  6. Tricuspid valve regurgitation is mild to moderate.  7. The aortic valve is grossly normal. Aortic valve regurgitation is not visualized. No aortic stenosis is present.  8. The inferior vena cava is normal in size with greater than 50% respiratory variability, suggesting right atrial pressure of 3 mmHg. FINDINGS  Left Ventricle: Left ventricular ejection fraction, by estimation, is 55 to 60%. The left ventricle has normal function. The left ventricle has no regional wall motion abnormalities. The left ventricular internal cavity size was normal in size. There is  no left ventricular hypertrophy. Left ventricular diastolic parameters are consistent with Grade II diastolic dysfunction (pseudonormalization). Right Ventricle: The right ventricular size is mildly enlarged. No increase in right ventricular wall thickness. Right ventricular systolic function is normal. There is moderately elevated pulmonary artery systolic pressure. The tricuspid regurgitant velocity is 3.30 m/s, and with an assumed right atrial pressure of 3 mmHg, the estimated right ventricular systolic pressure is 46.6 mmHg. Left Atrium: Left atrial size was mildly dilated.  Right Atrium: Right atrial size was normal in size. Pericardium: A small pericardial effusion is present. Mitral Valve: The mitral valve is grossly normal. Mild mitral valve regurgitation. No evidence of mitral valve stenosis. Tricuspid Valve: The tricuspid valve is normal in structure. Tricuspid valve regurgitation is mild to moderate. No evidence of tricuspid stenosis. Aortic Valve: The aortic valve is grossly normal. Aortic valve regurgitation is not visualized. No aortic stenosis is present. Pulmonic Valve: The pulmonic valve was normal in structure. Pulmonic valve regurgitation is trivial. No evidence of pulmonic stenosis. Aorta: The aortic root is normal in size and structure. Venous: The inferior vena cava is normal in size with greater than 50% respiratory variability, suggesting right atrial pressure of 3 mmHg. IAS/Shunts: No atrial level shunt detected by color flow Doppler.  LEFT VENTRICLE PLAX 2D LVIDd:         3.20 cm   Diastology LVIDs:         2.10 cm   LV e' medial:    6.20 cm/s LV PW:         0.90 cm   LV E/e' medial:  18.9 LV IVS:        0.80 cm   LV e' lateral:   6.85 cm/s LVOT diam:     1.90 cm   LV E/e' lateral: 17.1 LV SV:         59 LV SV Index:   39 LVOT Area:     2.84 cm  RIGHT VENTRICLE             IVC RV Basal diam:  2.40 cm     IVC diam: 1.40 cm RV S prime:     17.40 cm/s TAPSE (M-mode): 2.5 cm LEFT ATRIUM  Index        RIGHT ATRIUM           Index LA diam:      2.50 cm 1.67 cm/m   RA Area:     16.80 cm LA Vol (A4C): 46.1 ml 30.78 ml/m  RA Volume:   42.40 ml  28.31 ml/m  AORTIC VALVE LVOT Vmax:   95.40 cm/s LVOT Vmean:  68.300 cm/s LVOT VTI:    0.208 m  AORTA Ao Root diam: 3.10 cm Ao Asc diam:  3.50 cm MITRAL VALVE                TRICUSPID VALVE MV Area (PHT): 3.99 cm     TR Peak grad:   43.6 mmHg MV Decel Time: 190 msec     TR Vmax:        330.00 cm/s MV E velocity: 117.00 cm/s MV A velocity: 83.20 cm/s   SHUNTS MV E/A ratio:  1.41         Systemic VTI:  0.21 m                              Systemic Diam: 1.90 cm Weston Brass MD Electronically signed by Weston Brass MD Signature Date/Time: 07/27/2023/3:06:15 PM    Final    CT ABDOMEN PELVIS W CONTRAST  Result Date: 07/26/2023 CLINICAL DATA:  Left lower quadrant abdominal pain. EXAM: CT ABDOMEN AND PELVIS WITH CONTRAST TECHNIQUE: Multidetector CT imaging of the abdomen and pelvis was performed using the standard protocol following bolus administration of intravenous contrast. RADIATION DOSE REDUCTION: This exam was performed according to the departmental dose-optimization program which includes automated exposure control, adjustment of the mA and/or kV according to patient size and/or use of iterative reconstruction technique. CONTRAST:  60mL OMNIPAQUE IOHEXOL 300 MG/ML  SOLN COMPARISON:  CT abdomen and pelvis 05/03/2023 FINDINGS: Lower chest: No acute abnormality. Hepatobiliary: Vague hypodensity in the liver measuring 1.3 x 1.3 cm becomes isodense to the liver on delayed imaging most compatible with hemangioma. There is no biliary ductal dilatation or acute gallbladder abnormality. Pancreas: Unremarkable. No pancreatic ductal dilatation or surrounding inflammatory changes. Spleen: Spleen is normal in size. There are calcified granulomas in the spleen. Adrenals/Urinary Tract: There are scattered rounded hypodensities in both kidneys which are too small to characterize, likely cysts. There is no hydronephrosis or perinephric fat stranding. The adrenal glands and bladder are within normal limits. Stomach/Bowel: There severe sigmoid colon diverticulosis. There is no evidence for active inflammation. There is no bowel obstruction, pneumatosis or free air. Appendix is not seen. Small bowel and stomach are within normal limits. Vascular/Lymphatic: Aortic atherosclerosis. No enlarged abdominal or pelvic lymph nodes. Superior mesenteric artery stent is present. Reproductive: Uterus and adnexa are within normal limits. Other: No  abdominal wall hernia or abnormality. No abdominopelvic ascites. Musculoskeletal: There is levoconvex curvature of the spine. Degenerative changes affect the spine, right hip and pubic symphysis. IMPRESSION: 1. No acute localizing process in the abdomen or pelvis. 2. Severe sigmoid colon diverticulosis without evidence for acute diverticulitis. Aortic Atherosclerosis (ICD10-I70.0). Electronically Signed   By: Darliss Cheney M.D.   On: 07/26/2023 17:41   DG Chest 2 View  Result Date: 07/26/2023 CLINICAL DATA:  History of CHF.  Swelling in the legs for 10 days. EXAM: CHEST - 2 VIEW COMPARISON:  08/30/2022 FINDINGS: Again noted is S-shaped scoliosis in the thoracic and lumbar spine. Compared to the previous chest radiograph, there  is improved aeration in the lungs particularly at the right lung base. Heart size is upper limits of normal and stable. Atherosclerotic calcifications at the aortic arch. Hazy densities in both lungs but no focal airspace disease or overt pulmonary edema. Stable sclerosis or densities in the region of the right first rib. IMPRESSION: 1. No acute cardiopulmonary disease. 2. Severe scoliosis. Electronically Signed   By: Richarda Overlie M.D.   On: 07/26/2023 15:47    Microbiology: Results for orders placed or performed during the hospital encounter of 07/26/23  SARS Coronavirus 2 by RT PCR (hospital order, performed in [facility] hospital lab) *cepheid single result test* Anterior Nasal Swab     Status: None   Collection Time: 07/26/23  5:07 PM   Specimen: Anterior Nasal Swab  Result Value Ref Range Status   SARS Coronavirus 2 by RT PCR NEGATIVE NEGATIVE Final    Comment: (NOTE) SARS-CoV-2 target nucleic acids are NOT DETECTED.  The SARS-CoV-2 RNA is generally detectable in upper and lower respiratory specimens during the acute phase of infection. The lowest concentration of SARS-CoV-2 viral copies this assay can detect is 250 copies / mL. A negative result does not preclude  SARS-CoV-2 infection and should not be used as the sole basis for treatment or other patient management decisions.  A negative result may occur with improper specimen collection / handling, submission of specimen other than nasopharyngeal swab, presence of viral mutation(s) within the areas targeted by this assay, and inadequate number of viral copies (<250 copies / mL). A negative result must be combined with clinical observations, patient history, and epidemiological information.  Fact Sheet for Patients:   RoadLapTop.co.za  Fact Sheet for Healthcare Providers: http://kim-miller.com/  This test is not yet approved or  cleared by the [place] FDA and has been authorized for detection and/or diagnosis of SARS-CoV-2 by FDA under an Emergency Use Authorization (EUA).  This EUA will remain in effect (meaning this test can be used) for the duration of the COVID-19 declaration under Section 564(b)(1) of the Act, 21 U.S.C. section 360bbb-3(b)(1), unless the authorization is terminated or revoked sooner.  Performed at [facility], [address], [place], [place] [address]     Labs: CBC: Recent Labs  Lab 07/26/23 1450 07/27/23 0328 07/27/23 0644 07/28/23 0649  WBC 7.0 7.2 7.1 7.7  HGB 11.5* 11.0* 11.1* 11.0*  HCT 34.1* 32.9* 31.8* 32.7*  MCV 105.2* 102.5* 100.6* 102.8*  PLT 318 334 334 345   Basic Metabolic Panel: Recent Labs  Lab 07/26/23 1450 07/27/23 0328 07/27/23 0644 07/28/23 0649  NA 132* 136 133* 133*  K 4.0 3.6 3.5 3.3*  CL 96* 94* 97* 94*  CO2 25 25 24 28   GLUCOSE 184* 124* 211* 138*  BUN 36* 30* 29* 29*  CREATININE 1.41* 1.16* 1.30* 1.32*  CALCIUM 9.2 9.5 8.9 8.5*   Liver Function Tests: Recent Labs  Lab 07/27/23 0328 07/27/23 0644 07/28/23 0649  AST 34 27 23  ALT 17 18 15   ALKPHOS 79 74 67  BILITOT 0.7 0.3 0.6  PROT 6.7 6.2* 6.1*  ALBUMIN 3.8 3.4* 3.5   CBG: Recent Labs   Lab 07/27/23 1151 07/27/23 1548 07/27/23 2129 07/28/23 0610 07/28/23 1127  GLUCAP 170* 227* 296* 128* 230*    Discharge time spent: greater than 30 minutes.  Signed: Thad Ranger, MD [facility] 07/28/2023

## 2023-07-28 NOTE — Progress Notes (Signed)
07/28/23 1323  PT Visit Information  Last PT Received On 07/28/23  Assistance Needed +1  History of Present Illness Patient is a 89 year old female admitted 8/28 with shortness of breath, bilateral LE edema, weight gain and abdominal pain.  W/u for CHF exacerbation.  Also with vertigo of which she had been going to Outpt PT.  PMH: diastolic CHF, preserved EF, paroxysmal A-fib on Eliquis, DM type II, HTN, CKD stage II  Subjective Data  Patient Stated Goal to go home  Precautions  Precautions Fall  Restrictions  Weight Bearing Restrictions No  Pain Assessment  Pain Assessment Faces  Faces Pain Scale 8  Pain Location neck and head  Pain Descriptors / Indicators Aching;Discomfort;Grimacing;Guarding  Pain Intervention(s) Limited activity within patient's tolerance;Monitored during session;Repositioned;Patient requesting pain meds-RN notified  Cognition  Arousal Alert  Behavior During Therapy WFL for tasks assessed/performed  Overall Cognitive Status Within Functional Limits for tasks assessed  Bed Mobility  Overal bed mobility Needs Assistance  Bed Mobility Rolling;Sidelying to Sit;Sit to Supine  Rolling Contact guard assist;Min assist  Sidelying to sit Min assist  Sit to supine Min assist  General bed mobility comments Suspicious for left posterior canal BPPV therefore performed Epley for left posterior canal BPPV as suspicious that crystals moved to that canal.   Pt needed min assist for bed mobility as she was dizzy.  Balance  Overall balance assessment Needs assistance  Sitting-balance support No upper extremity supported;Feet supported  Sitting balance-Leahy Scale Fair  Standing balance support Bilateral upper extremity supported;During functional activity  Standing balance-Leahy Scale Poor  Standing balance comment needs bil UE support  General Comments  General comments (skin integrity, edema, etc.) VSS  Exercises  Exercises Other exercises  Other Exercises  Other  Exercises Educated in x 1 exercises but pt aware to not begin these until she is sure that BPPV is clear.  Also gave this so that HHPT will have progression of activity for pts.  Gave exercise program from [facility]  PT - End of Session  Equipment Utilized During Treatment Gait belt  Activity Tolerance Patient limited by fatigue  Patient left in bed;with call bell/phone within reach;with bed alarm set  Nurse Communication Mobility status   PT - Assessment/Plan  PT Visit Diagnosis Muscle weakness (generalized) (M62.81);Other abnormalities of gait and mobility (R26.89)  PT Frequency (ACUTE ONLY) Min 1X/week  Follow Up Recommendations Home health PT (Vestibular rehab)  PT equipment None recommended by PT  AM-PAC PT "6 Clicks" Mobility Outcome Measure (Version 2)  Help needed turning from your back to your side while in a flat bed without using bedrails? 4  Help needed moving from lying on your back to sitting on the side of a flat bed without using bedrails? 3  Help needed moving to and from a bed to a chair (including a wheelchair)? 3  Help needed standing up from a chair using your arms (e.g., wheelchair or bedside chair)? 3  Help needed to walk in hospital room? 3  Help needed climbing 3-5 steps with a railing?  3  6 Click Score 19  Consider Recommendation of Discharge To: Home with [facility]  Progressive Mobility  What is the highest level of mobility based on the progressive mobility assessment? Level 4 (Walks with assist in room) - Balance while marching in place and cannot step forward and back - Complete  Activity Ambulated with assistance in room  PT Goal Progression  Progress towards PT goals Progressing toward goals  PT Time  Calculation  PT Start Time (ACUTE ONLY) 1120  PT Stop Time (ACUTE ONLY) 1155  PT Time Calculation (min) (ACUTE ONLY) 35 min  PT General Charges  $$ ACUTE PT VISIT 1 Visit  PT Treatments  $Therapeutic Exercise 8-22 mins  $Canalith Rep Proc 8-22 mins    Pt felt better after second treatment. All education with pt completed and pt to go home with son today.  Pt to use rollator at all times initially and is aware and will receive HHPT at home.  Kordelia Severin M,PT Acute Rehab Services [phone]

## 2023-07-28 NOTE — TOC Transition Note (Addendum)
Transition of Care [facility]) - CM/SW Discharge Note   Patient Details  Name: Heather Olsen MRN: [id number] Date of Birth: 12-26-1930  Transition of [facility]) CM/SW Contact:  Leone Haven, RN Phone Number: 07/28/2023, 11:06 AM   Clinical Narrative:    For dc today back to her IDL facility, she will do outpatient PT with [name] onsite.  She has transportation home.  NCM faxed orders to [facility].   Final next level of care: Home w Home Health Services Barriers to Discharge: No Barriers Identified   Patient Goals and CMS Choice   Choice offered to / list presented to : NA  Discharge Placement                         Discharge Plan and Services Additional resources added to the After Visit Summary for   In-house Referral: NA Discharge Planning Services: CM Consult Post Acute Care Choice: Home Health          DME Arranged: N/A DME Agency: NA       HH Arranged: PT HH Agency:  [facility] Re[facility] onsite) Date HH Agency Contacted: 07/28/23 Time HH Agency Contacted: 1101 Representative spoke with at [facility] Agency: Mark [phone]  Social Determinants of Health (SDOH) Interventions SDOH Screenings   Food Insecurity: No Food Insecurity (07/27/2023)  Housing: Patient Declined (07/27/2023)  Transportation Needs: No Transportation Needs (07/27/2023)  Utilities: Not At Risk (07/27/2023)  Tobacco Use: Low Risk  (07/27/2023)     Readmission Risk Interventions     No data to display

## 2023-08-03 DIAGNOSIS — M6281 Muscle weakness (generalized): Secondary | ICD-10-CM | POA: Diagnosis not present

## 2023-08-03 DIAGNOSIS — R197 Diarrhea, unspecified: Secondary | ICD-10-CM | POA: Diagnosis not present

## 2023-08-03 DIAGNOSIS — R140 Abdominal distension (gaseous): Secondary | ICD-10-CM | POA: Diagnosis not present

## 2023-08-03 DIAGNOSIS — M542 Cervicalgia: Secondary | ICD-10-CM | POA: Diagnosis not present

## 2023-08-03 DIAGNOSIS — Z09 Encounter for follow-up examination after completed treatment for conditions other than malignant neoplasm: Secondary | ICD-10-CM | POA: Diagnosis not present

## 2023-08-03 DIAGNOSIS — Z8719 Personal history of other diseases of the digestive system: Secondary | ICD-10-CM | POA: Diagnosis not present

## 2023-08-03 DIAGNOSIS — E1122 Type 2 diabetes mellitus with diabetic chronic kidney disease: Secondary | ICD-10-CM | POA: Diagnosis not present

## 2023-08-03 DIAGNOSIS — I5033 Acute on chronic diastolic (congestive) heart failure: Secondary | ICD-10-CM | POA: Diagnosis not present

## 2023-08-03 DIAGNOSIS — I4819 Other persistent atrial fibrillation: Secondary | ICD-10-CM | POA: Diagnosis not present

## 2023-08-03 DIAGNOSIS — R635 Abnormal weight gain: Secondary | ICD-10-CM | POA: Diagnosis not present

## 2023-08-03 DIAGNOSIS — R2689 Other abnormalities of gait and mobility: Secondary | ICD-10-CM | POA: Diagnosis not present

## 2023-08-06 DIAGNOSIS — M542 Cervicalgia: Secondary | ICD-10-CM | POA: Diagnosis not present

## 2023-08-06 DIAGNOSIS — I5033 Acute on chronic diastolic (congestive) heart failure: Secondary | ICD-10-CM | POA: Diagnosis not present

## 2023-08-06 DIAGNOSIS — I4819 Other persistent atrial fibrillation: Secondary | ICD-10-CM | POA: Diagnosis not present

## 2023-08-06 DIAGNOSIS — R2689 Other abnormalities of gait and mobility: Secondary | ICD-10-CM | POA: Diagnosis not present

## 2023-08-06 DIAGNOSIS — M6281 Muscle weakness (generalized): Secondary | ICD-10-CM | POA: Diagnosis not present

## 2023-08-06 NOTE — Progress Notes (Unsigned)
Cardiology Office Note:   Date:  08/06/2023  ID:  Heather Olsen, Heather Olsen November 22, 1931, MRN [id number] PCP: Merri Brunette, MD   HeartCare Providers Cardiologist:  Rollene Rotunda, MD {  History of Present Illness:   Heather Olsen is a 89 y.o. female diastolic HF and PAF who was in the hospital in August 2024.  She had volume overload and had gentle diuresis.  She was in atrial fib in the hosp and had rate control.     She underwent cardiac catheterization with stent placement in 2008 and was followed by [facility].  She underwent follow-up cardiac catheterization in 2009 which showed 30% narrowing of her stent but was otherwise disease-free.  Her stress test 2014 was negative.  She was noted to have tricuspid regurgitation and mitral valve regurgitation.  Her nuclear stress test 2017 was negative.  She was noted to have dizziness and found to have short runs of atrial fibrillation.  She was noted to have tacky bradycardia palpitations and was switched from beta-blocker to pindolol.  She was noted to have shortness of breath and her echocardiogram showed normal LV function with moderately elevated pulmonary pressures.  Her BNP at that time was mildly elevated.  She was hospitalized in Oct 2023 with HF.    She presents for follow up.  ***     ***   She was seen by Dr. Antoine Poche on 09/20/2022.  She was seen for hospital follow-up for acute on chronic diastolic CHF.  She was hospitalized with diverticulitis.  During discharge she was instructed to not take her diuretic.  Her diuretic was held for 4 days.  She presented back to the emergency department a few days later and was admitted with acute on chronic diastolic CHF.  She resumed 20 mg of Lasix daily.  She was noted to have some lower extremity swelling.  She was instructed that she needed to hydrate due to renal insufficiency.  Dr. Antoine Poche reviewed labs and it was felt that her creatinine remained stable during hospitalization.  After initial  hospitalization her weight was 105.4 pounds and after second hospitalization her weight was 109 pounds.  On follow-up she was noted to be 107 pounds.  She felt her breathing was much better since her second admission.  She denied PND and orthopnea.  She denied chest pain and back pain.   She presents to the clinic today for follow-up evaluation and states she feels well.  She lives in her apartment with her partner.  She enjoys his company.  She walks regularly to get to the dining hall and activities.  She denies shortness of breath and chest discomfort.  She does report an episode of vertigo last week.  She presented to [facility] and was given meclizine.  This helped with her symptoms.  She does note some occasional ankle edema.  We reviewed the importance of heart healthy low-sodium diet and elevating her lower extremities.  Will plan follow-up in 6 months.   Today she denies chest pain, shortness of breath, lower extremity edema, fatigue, palpitations, melena, hematuria, hemoptysis, diaphoresis, weakness, presyncope, syncope, orthopnea, and PND   Acute on chronic heart failure with preserved ejection fraction (HFpEF) (HCC) -Presented with lower extremity swelling, SOB, weight gain, BNP 328 -Patient was placed on Lasix IV 20 mg twice daily -2D echo showed EF of 55 to 60% with G2 DD -Negative balance of 570 cc, feels symptomatically improved, transition to oral Lasix 40 mg daily -Outpatient follow-up with her cardiologist, Dr. Antoine Poche scheduled -  Follow bmet in 1 week        AKI on CKD stage II -Creatinine 1.41 on presentation, baseline 0.9-1.0 -Creatinine 1.3 at the time of discharge   Essential hypertension -BP controlled, resume low-dose beta-blocker     Paroxysmal atrial fibrillation -Rate controlled -Continue Eliquis 2.5 mg twice daily     ROS: ***  Studies Reviewed:    EKG:       ***  Risk Assessment/Calculations:   {Does this patient have ATRIAL  FIBRILLATION?:[id number]} No BP recorded.  {Refresh Note OR Click here to enter BP  :1}***        Physical Exam:   VS:  There were no vitals taken for this visit.   Wt Readings from Last 3 Encounters:  07/28/23 106 lb 11.2 oz (48.4 kg)  04/05/23 107 lb (48.5 kg)  03/30/23 108 lb (49 kg)     GEN: Well nourished, well developed in no acute distress NECK: No JVD; No carotid bruits CARDIAC: ***RRR, no murmurs, rubs, gallops RESPIRATORY:  Clear to auscultation without rales, wheezing or rhonchi  ABDOMEN: Soft, non-tender, non-distended EXTREMITIES:  No edema; No deformity   ASSESSMENT AND PLAN:   CAD:    ***  The patient has no new sypmtoms.  No further cardiovascular testing is indicated.  We will continue with aggressive risk reduction and meds as listed.   ATRIAL FIB:      Heather Olsen has a CHA2DS2 - VASc score of 5.  ***  She tolerates anticoagulation.  She is in sinus today.  She does not feel paroxysms.  No change in therapy.    HTN: The blood pressure was *** at target.  No change in therapy.     PULMONARY HTN:       *** There was no mention of pulmonary hypertension on the recent echo.    CHRONIC DIASTOLIC HF:   ***  We had a long discussion about Lasix dosing.  She has been hydrating orally I have asked her to reduce that.  We talked about as needed dosing of her diuretic if her weight goes up a couple of pounds.  We talked about cutting back on her diuretic if she starts to get lightheaded and her blood pressure goes down.  I think at target weight is around 107 pounds.    {Are you ordering a CV Procedure (e.g. stress test, cath, DCCV, TEE, etc)?   Press F2        :[id number]}  Follow up ***  Signed, Rollene Rotunda, MD

## 2023-08-08 ENCOUNTER — Ambulatory Visit (INDEPENDENT_AMBULATORY_CARE_PROVIDER_SITE_OTHER): Payer: Medicare Other | Admitting: Cardiology

## 2023-08-08 ENCOUNTER — Encounter: Payer: Self-pay | Admitting: Cardiology

## 2023-08-08 ENCOUNTER — Telehealth: Payer: Self-pay | Admitting: Cardiology

## 2023-08-08 ENCOUNTER — Emergency Department (HOSPITAL_BASED_OUTPATIENT_CLINIC_OR_DEPARTMENT_OTHER)
Admission: EM | Admit: 2023-08-08 | Discharge: 2023-08-09 | Disposition: A | Discharge status: Home / Self Care | Payer: Medicare Other | Attending: Emergency Medicine | Admitting: Emergency Medicine

## 2023-08-08 ENCOUNTER — Other Ambulatory Visit: Payer: Self-pay

## 2023-08-08 VITALS — BP 100/60 | HR 78 | Ht 62.0 in | Wt 111.6 lb

## 2023-08-08 DIAGNOSIS — D539 Nutritional anemia, unspecified: Secondary | ICD-10-CM | POA: Diagnosis not present

## 2023-08-08 DIAGNOSIS — Z79899 Other long term (current) drug therapy: Secondary | ICD-10-CM

## 2023-08-08 DIAGNOSIS — H9190 Unspecified hearing loss, unspecified ear: Secondary | ICD-10-CM | POA: Diagnosis present

## 2023-08-08 DIAGNOSIS — I10 Essential (primary) hypertension: Secondary | ICD-10-CM

## 2023-08-08 DIAGNOSIS — I480 Paroxysmal atrial fibrillation: Secondary | ICD-10-CM

## 2023-08-08 DIAGNOSIS — I2510 Atherosclerotic heart disease of native coronary artery without angina pectoris: Secondary | ICD-10-CM | POA: Diagnosis present

## 2023-08-08 DIAGNOSIS — Z7984 Long term (current) use of oral hypoglycemic drugs: Secondary | ICD-10-CM | POA: Insufficient documentation

## 2023-08-08 DIAGNOSIS — I491 Atrial premature depolarization: Secondary | ICD-10-CM | POA: Diagnosis present

## 2023-08-08 DIAGNOSIS — J9601 Acute respiratory failure with hypoxia: Secondary | ICD-10-CM | POA: Diagnosis not present

## 2023-08-08 DIAGNOSIS — E1122 Type 2 diabetes mellitus with diabetic chronic kidney disease: Secondary | ICD-10-CM | POA: Diagnosis not present

## 2023-08-08 DIAGNOSIS — J42 Unspecified chronic bronchitis: Secondary | ICD-10-CM | POA: Diagnosis not present

## 2023-08-08 DIAGNOSIS — M542 Cervicalgia: Secondary | ICD-10-CM | POA: Diagnosis not present

## 2023-08-08 DIAGNOSIS — J40 Bronchitis, not specified as acute or chronic: Secondary | ICD-10-CM | POA: Diagnosis not present

## 2023-08-08 DIAGNOSIS — K219 Gastro-esophageal reflux disease without esophagitis: Secondary | ICD-10-CM | POA: Diagnosis not present

## 2023-08-08 DIAGNOSIS — R0902 Hypoxemia: Secondary | ICD-10-CM | POA: Diagnosis not present

## 2023-08-08 DIAGNOSIS — I440 Atrioventricular block, first degree: Secondary | ICD-10-CM | POA: Diagnosis not present

## 2023-08-08 DIAGNOSIS — I5033 Acute on chronic diastolic (congestive) heart failure: Secondary | ICD-10-CM | POA: Diagnosis not present

## 2023-08-08 DIAGNOSIS — E119 Type 2 diabetes mellitus without complications: Secondary | ICD-10-CM | POA: Insufficient documentation

## 2023-08-08 DIAGNOSIS — I700 Atherosclerosis of aorta: Secondary | ICD-10-CM | POA: Diagnosis not present

## 2023-08-08 DIAGNOSIS — I5032 Chronic diastolic (congestive) heart failure: Secondary | ICD-10-CM

## 2023-08-08 DIAGNOSIS — N179 Acute kidney failure, unspecified: Secondary | ICD-10-CM | POA: Diagnosis not present

## 2023-08-08 DIAGNOSIS — K589 Irritable bowel syndrome without diarrhea: Secondary | ICD-10-CM | POA: Diagnosis not present

## 2023-08-08 DIAGNOSIS — N3281 Overactive bladder: Secondary | ICD-10-CM | POA: Diagnosis not present

## 2023-08-08 DIAGNOSIS — K5732 Diverticulitis of large intestine without perforation or abscess without bleeding: Secondary | ICD-10-CM | POA: Diagnosis not present

## 2023-08-08 DIAGNOSIS — G2581 Restless legs syndrome: Secondary | ICD-10-CM | POA: Diagnosis not present

## 2023-08-08 DIAGNOSIS — E785 Hyperlipidemia, unspecified: Secondary | ICD-10-CM | POA: Diagnosis present

## 2023-08-08 DIAGNOSIS — I7300 Raynaud's syndrome without gangrene: Secondary | ICD-10-CM | POA: Diagnosis present

## 2023-08-08 DIAGNOSIS — R339 Retention of urine, unspecified: Secondary | ICD-10-CM | POA: Insufficient documentation

## 2023-08-08 DIAGNOSIS — D1803 Hemangioma of intra-abdominal structures: Secondary | ICD-10-CM | POA: Diagnosis not present

## 2023-08-08 DIAGNOSIS — F411 Generalized anxiety disorder: Secondary | ICD-10-CM | POA: Diagnosis not present

## 2023-08-08 DIAGNOSIS — M47812 Spondylosis without myelopathy or radiculopathy, cervical region: Secondary | ICD-10-CM | POA: Diagnosis not present

## 2023-08-08 DIAGNOSIS — G8929 Other chronic pain: Secondary | ICD-10-CM | POA: Diagnosis present

## 2023-08-08 DIAGNOSIS — I2489 Other forms of acute ischemic heart disease: Secondary | ICD-10-CM | POA: Diagnosis not present

## 2023-08-08 DIAGNOSIS — Z1152 Encounter for screening for COVID-19: Secondary | ICD-10-CM | POA: Diagnosis not present

## 2023-08-08 DIAGNOSIS — I517 Cardiomegaly: Secondary | ICD-10-CM | POA: Diagnosis not present

## 2023-08-08 DIAGNOSIS — I272 Pulmonary hypertension, unspecified: Secondary | ICD-10-CM | POA: Diagnosis not present

## 2023-08-08 DIAGNOSIS — N3289 Other specified disorders of bladder: Secondary | ICD-10-CM | POA: Diagnosis not present

## 2023-08-08 DIAGNOSIS — Z7901 Long term (current) use of anticoagulants: Secondary | ICD-10-CM | POA: Insufficient documentation

## 2023-08-08 DIAGNOSIS — I130 Hypertensive heart and chronic kidney disease with heart failure and stage 1 through stage 4 chronic kidney disease, or unspecified chronic kidney disease: Secondary | ICD-10-CM | POA: Diagnosis not present

## 2023-08-08 DIAGNOSIS — K5792 Diverticulitis of intestine, part unspecified, without perforation or abscess without bleeding: Secondary | ICD-10-CM | POA: Diagnosis not present

## 2023-08-08 DIAGNOSIS — M4120 Other idiopathic scoliosis, site unspecified: Secondary | ICD-10-CM | POA: Diagnosis not present

## 2023-08-08 DIAGNOSIS — K5730 Diverticulosis of large intestine without perforation or abscess without bleeding: Secondary | ICD-10-CM | POA: Diagnosis not present

## 2023-08-08 DIAGNOSIS — N182 Chronic kidney disease, stage 2 (mild): Secondary | ICD-10-CM | POA: Diagnosis not present

## 2023-08-08 DIAGNOSIS — I509 Heart failure, unspecified: Secondary | ICD-10-CM | POA: Diagnosis present

## 2023-08-08 DIAGNOSIS — K5790 Diverticulosis of intestine, part unspecified, without perforation or abscess without bleeding: Secondary | ICD-10-CM | POA: Diagnosis not present

## 2023-08-08 MED ORDER — FUROSEMIDE 20 MG PO TABS
20.0000 mg | ORAL_TABLET | Freq: Every day | ORAL | 3 refills | Status: DC
Start: 2023-08-08 — End: 2023-08-08

## 2023-08-08 MED ORDER — FUROSEMIDE 20 MG PO TABS: 20.0000 mg | ORAL_TABLET | Freq: Every day | ORAL | 3 refills | Status: DC

## 2023-08-08 MED ORDER — FUROSEMIDE 10 MG/ML PO SOLN
5.0000 mg | Freq: Every day | ORAL | 12 refills | Status: DC | PRN
Start: 2023-08-08 — End: 2023-08-12

## 2023-08-08 NOTE — ED Triage Notes (Signed)
[facility] reporting urinary retention that started this morning, reports only "dribbling" when trying to urinate. Called urologist, wouldn't be seen until Nov. 82ml seen on bladder scan in triage.

## 2023-08-08 NOTE — Addendum Note (Signed)
Addended by: Jonah Blue on: 08/08/2023 04:05 PM   Modules accepted: Orders

## 2023-08-08 NOTE — Patient Instructions (Addendum)
Medication Instructions:  Your physician has recommended you make the following change in your medication:  AS NEEDED: Lasix 5 mg for 2 pound weight gain overnight.  *If you need a refill on your cardiac medications before your next appointment, please call your pharmacy*   Lab Work: Your physician recommends that you have labs drawn today: BMET If you have labs (blood work) drawn today and your tests are completely normal, you will receive your results only by: MyChart Message (if you have MyChart) OR A paper copy in the mail If you have any lab test that is abnormal or we need to change your treatment, we will call you to review the results.   Testing/Procedures: None   Follow-Up: At [facility], you and your health needs are our priority.  As part of our continuing mission to provide you with exceptional heart care, we have created designated Provider Care Teams.  These Care Teams include your primary Cardiologist (physician) and Advanced Practice Providers (APPs -  Physician Assistants and Nurse Practitioners) who all work together to provide you with the care you need, when you need it.   Your next appointment:   2 month(s)  Provider:   Edd Fabian, FNP, Micah Flesher, PA-C, Marjie Skiff, PA-C, Robet Leu, PA-C, Juanda Crumble, PA-C, Joni Reining, DNP, ANP, Azalee Course, PA-C, Bernadene Person, NP, Perlie Gold, PA-C, or Carlos Levering, NP

## 2023-08-08 NOTE — Telephone Encounter (Signed)
Called to clarify that the patient is to take 5 mg of Lasix for a weight gain of 2 pounds or more overnight.   The pharmacist states that the pill is so small, that even trying to cut it in half would probably turn it to powder. She said there is a 10mg /ml solution available.  I asked Dr Antoine Poche if the order can be changed to the solution and he agreed to the change.  New orders placed and sent to the pharmacy.

## 2023-08-08 NOTE — Telephone Encounter (Signed)
Pt c/o medication issue:  1. Name of Medication: furosemide (LASIX) 20 MG tablet   2. How are you currently taking this medication (dosage and times per day)?     3. Are you having a reaction (difficulty breathing--STAT)? no  4. What is your medication issue? Pharmacy calling with questions concern prescription. Please advise  .

## 2023-08-09 ENCOUNTER — Emergency Department (HOSPITAL_BASED_OUTPATIENT_CLINIC_OR_DEPARTMENT_OTHER): Payer: Medicare Other

## 2023-08-09 DIAGNOSIS — N3289 Other specified disorders of bladder: Secondary | ICD-10-CM | POA: Diagnosis not present

## 2023-08-09 DIAGNOSIS — K5790 Diverticulosis of intestine, part unspecified, without perforation or abscess without bleeding: Secondary | ICD-10-CM | POA: Diagnosis not present

## 2023-08-09 LAB — BASIC METABOLIC PANEL
Anion gap: 9 (ref 5–15)
BUN/Creatinine Ratio: 24 (ref 12–28)
BUN: 27 mg/dL (ref 10–36)
BUN: 29 mg/dL — ABNORMAL HIGH (ref 8–23)
CO2: 27 mmol/L (ref 20–29)
CO2: 28 mmol/L (ref 22–32)
Calcium: 10.2 mg/dL (ref 8.7–10.3)
Calcium: 8.9 mg/dL (ref 8.9–10.3)
Chloride: 93 mmol/L — ABNORMAL LOW (ref 96–106)
Chloride: 96 mmol/L — ABNORMAL LOW (ref 98–111)
Creatinine, Ser: 1.11 mg/dL — ABNORMAL HIGH (ref 0.57–1.00)
Creatinine, Ser: 0.92 mg/dL (ref 0.44–1.00)
GFR, Estimated: 59 mL/min — ABNORMAL LOW (ref >60)
Glucose, Bld: 137 mg/dL — ABNORMAL HIGH (ref 70–99)
Glucose: 124 mg/dL — ABNORMAL HIGH (ref 70–99)
Potassium: 4.6 mmol/L (ref 3.5–5.2)
Potassium: 3.4 mmol/L — ABNORMAL LOW (ref 3.5–5.1)
Sodium: 135 mmol/L (ref 134–144)
Sodium: 133 mmol/L — ABNORMAL LOW (ref 135–145)
eGFR: 47 mL/min/{1.73_m2} — ABNORMAL LOW (ref >59)

## 2023-08-09 LAB — URINALYSIS, ROUTINE W REFLEX MICROSCOPIC
Bilirubin Urine: NEGATIVE
Glucose, UA: NEGATIVE mg/dL
Hgb urine dipstick: NEGATIVE
Ketones, ur: NEGATIVE mg/dL
Leukocytes,Ua: NEGATIVE
Nitrite: NEGATIVE
Protein, ur: NEGATIVE mg/dL
Specific Gravity, Urine: 1.013 (ref 1.005–1.030)
pH: 6.5 (ref 5.0–8.0)

## 2023-08-09 LAB — CBC WITH DIFFERENTIAL/PLATELET
Abs Immature Granulocytes: 0.03 10*3/uL (ref 0.00–0.07)
Basophils Absolute: 0.1 10*3/uL (ref 0.0–0.1)
Basophils Relative: 1 %
Eosinophils Absolute: 0.1 10*3/uL (ref 0.0–0.5)
Eosinophils Relative: 2 %
HCT: 30 % — ABNORMAL LOW (ref 36.0–46.0)
Hemoglobin: 10.2 g/dL — ABNORMAL LOW (ref 12.0–15.0)
Immature Granulocytes: 0 %
Lymphocytes Relative: 14 %
Lymphs Abs: 1.2 10*3/uL (ref 0.7–4.0)
MCH: 35.5 pg — ABNORMAL HIGH (ref 26.0–34.0)
MCHC: 34 g/dL (ref 30.0–36.0)
MCV: 104.5 fL — ABNORMAL HIGH (ref 80.0–100.0)
Monocytes Absolute: 1.1 10*3/uL — ABNORMAL HIGH (ref 0.1–1.0)
Monocytes Relative: 12 %
Neutro Abs: 6.3 10*3/uL (ref 1.7–7.7)
Neutrophils Relative %: 71 %
Platelets: 281 10*3/uL (ref 150–400)
RBC: 2.87 MIL/uL — ABNORMAL LOW (ref 3.87–5.11)
RDW: 12.4 % (ref 11.5–15.5)
WBC: 8.8 10*3/uL (ref 4.0–10.5)
nRBC: 0 % (ref 0.0–0.2)

## 2023-08-09 NOTE — ED Provider Notes (Signed)
[place] EMERGENCY DEPARTMENT AT [facility] Provider Note   CSN: [id number] Arrival date & time: 08/08/23  2241     History  Chief Complaint  Patient presents with   Urinary Retention    Heather Olsen is a 89 y.o. female.  Patient is a 89 year old female with past medical history of coronary artery disease, hyperlipidemia, depression, scoliosis, anemia, atrial fibrillation, type 2 diabetes, pulmonary hypertension.  Patient presenting today for evaluation of urinary retention.  She voided normally this morning when she woke up from sleep, then throughout the day has only been going in "small dribbles".  She denies any burning with urination.  She denies fevers or chills.  She does describe feeling more distended in her abdomen.  She does take Lasix.  The history is provided by the patient.       Home Medications Prior to Admission medications   Medication Sig Start Date End Date Taking? Authorizing Provider  acetaminophen (TYLENOL) 500 MG tablet Take 500-1,000 mg by mouth every 6 (six) hours as needed for moderate pain.    [provider]  Cholecalciferol (VITAMIN D3) 2000 units capsule Take 2,000 Units by mouth daily.     [provider]  denosumab (PROLIA) 60 MG/ML SOSY injection Inject 60 mg into the skin every 6 (six) months. 07/25/19   [provider]  diclofenac Sodium (VOLTAREN) 1 % GEL Apply topically 4 (four) times daily.    [provider]  dicyclomine (BENTYL) 20 MG tablet Take 1 tablet (20 mg total) by mouth 2 (two) times daily. 05/03/23   Fondaw, Wylder S, PA  ELIQUIS 2.5 MG TABS tablet Take 2.5 mg by mouth 2 (two) times daily. 02/03/20   [provider]  furosemide (LASIX) 10 MG/ML solution Take 0.5 mLs (5 mg total) by mouth daily as needed for fluid or edema (take 5 mg for a weight gain of 2 pounds or more overnight). 08/08/23   Rollene Rotunda, MD  ipratropium (ATROVENT) 0.06 % nasal spray Use 2 sprays in each nostril  up to three times daily before meals for sinus drainage. Patient taking differently: Place 1 spray into the nose daily. 09/30/16   Kimber Relic, MD  LINZESS 145 MCG CAPS capsule Take 145 mcg by mouth daily as needed (constipation). 07/29/22   [provider]  metFORMIN (GLUCOPHAGE-XR) 500 MG 24 hr tablet Take 1,000 mg by mouth every evening. 02/01/22   [provider]  mirabegron ER (MYRBETRIQ) 50 MG TB24 tablet Take 50 mg by mouth daily. 08/11/21   [provider]  MULTIPLE VITAMINS-MINERALS PO Take 1 tablet by mouth daily.     [provider]  nitroGLYCERIN (NITROSTAT) 0.4 MG SL tablet Place 1 tablet (0.4 mg total) under the tongue every 5 (five) minutes as needed for chest pain. Place one tablet under the tongue every 5 minutes as needed for chest pain. No more than 3 08/09/22   Rollene Rotunda, MD  ondansetron (ZOFRAN) 4 MG tablet Take 1 tablet (4 mg total) by mouth every 6 (six) hours as needed for nausea or vomiting. 07/28/23   Rai, Ripudeep K, MD  pantoprazole (PROTONIX) 40 MG tablet Take 40 mg by mouth 2 (two) times daily. 12/18/19   [provider]  pindolol (VISKEN) 5 MG tablet Take 1/2 (one-half) tablet by mouth twice daily Patient taking differently: Take 2.5 mg by mouth 2 (two) times daily. 07/04/23   Rollene Rotunda, MD  Probiotic Product (PROBIOTIC DAILY PO) Take 1 capsule by mouth  every evening.    [provider]  rOPINIRole (REQUIP) 0.5 MG tablet Take 0.5 mg by mouth at bedtime. 07/13/22   [provider]  rosuvastatin (CRESTOR) 10 MG tablet TAKE 1 TABLET BY MOUTH AT BEDTIME Patient taking differently: Take 10 mg by mouth daily. 11/23/20   Maeola Harman, MD  sertraline (ZOLOFT) 25 MG tablet Take 25 mg by mouth at bedtime. 08/10/22   [provider]  traMADol (ULTRAM) 50 MG tablet TAKE 1 TABLET BY MOUTH EVERY 4 HOURS Patient taking differently: Take 50 mg by mouth every 12 (twelve) hours as needed for moderate  pain or severe pain (pain). 11/14/16   Reed, Tiffany L, DO      Allergies    Sulfasalazine, Topiramate, Ace inhibitors, Codeine, Ibandronic acid, Meloxicam, Prevacid [lansoprazole], Sulfa antibiotics, Verapamil, and Zolpidem    Review of Systems   Review of Systems  All other systems reviewed and are negative.   Physical Exam Updated Vital Signs BP 127/67 (BP Location: Left Arm)   Pulse 66   Temp 98 F (36.7 C) (Oral)   Resp 18   SpO2 96%  Physical Exam Vitals and nursing note reviewed.  Constitutional:      General: She is not in acute distress.    Appearance: She is well-developed. She is not diaphoretic.  HENT:     Head: Normocephalic and atraumatic.  Cardiovascular:     Rate and Rhythm: Normal rate and regular rhythm.     Heart sounds: No murmur heard.    No friction rub. No gallop.  Pulmonary:     Effort: Pulmonary effort is normal. No respiratory distress.     Breath sounds: Normal breath sounds. No wheezing.  Abdominal:     General: Bowel sounds are normal. There is no distension.     Palpations: Abdomen is soft.     Tenderness: There is abdominal tenderness. There is no right CVA tenderness, left CVA tenderness, guarding or rebound.     Comments: There is tenderness to palpation in the suprapubic region.  Musculoskeletal:        General: Normal range of motion.     Cervical back: Normal range of motion and neck supple.  Skin:    General: Skin is warm and dry.  Neurological:     General: No focal deficit present.     Mental Status: She is alert and oriented to person, place, and time.     ED Results / Procedures / Treatments   Labs (all labs ordered are listed, but only abnormal results are displayed) Labs Reviewed - No data to display  EKG None  Radiology No results found.  Procedures Procedures    Medications Ordered in ED Medications - No data to display  ED Course/ Medical Decision Making/ A&P  Patient presenting here with complaints of  urinary retention.  She reports little urine output throughout the day.  Patient arrives here with stable vital signs and is afebrile.  There is some suprapubic tenderness, but exam otherwise unremarkable.  Laboratory studies obtained including CBC and basic metabolic panel, both of which are unremarkable.  Urinalysis is negative for infection.  I did obtain a CT scan with renal protocol showing a distended urinary bladder, but no other acute process.  Patient was able to void a large amount of urine and now feels markedly improved.  I am uncertain as to the etiology of her urinary retention, but feel as though patient can safely be discharged.  She has to  follow-up as needed.  Final Clinical Impression(s) / ED Diagnoses Final diagnoses:  None    Rx / DC Orders ED Discharge Orders     None         Geoffery Lyons, MD 08/09/23 [phone]

## 2023-08-09 NOTE — Discharge Instructions (Signed)
Return to the emergency department if symptoms significantly worsen or change. 

## 2023-08-10 ENCOUNTER — Other Ambulatory Visit: Payer: Self-pay

## 2023-08-10 ENCOUNTER — Encounter (HOSPITAL_BASED_OUTPATIENT_CLINIC_OR_DEPARTMENT_OTHER): Payer: Self-pay

## 2023-08-10 ENCOUNTER — Emergency Department (HOSPITAL_BASED_OUTPATIENT_CLINIC_OR_DEPARTMENT_OTHER): Payer: Medicare Other

## 2023-08-10 ENCOUNTER — Inpatient Hospital Stay (HOSPITAL_BASED_OUTPATIENT_CLINIC_OR_DEPARTMENT_OTHER)
Admission: EM | Admit: 2023-08-10 | Discharge: 2023-08-12 | DRG: 392 | Disposition: A | Discharge status: Home / Self Care | Payer: Medicare Other | Attending: Internal Medicine | Admitting: Internal Medicine

## 2023-08-10 DIAGNOSIS — I700 Atherosclerosis of aorta: Secondary | ICD-10-CM | POA: Diagnosis not present

## 2023-08-10 DIAGNOSIS — D1803 Hemangioma of intra-abdominal structures: Secondary | ICD-10-CM | POA: Diagnosis not present

## 2023-08-10 DIAGNOSIS — I491 Atrial premature depolarization: Secondary | ICD-10-CM | POA: Diagnosis present

## 2023-08-10 DIAGNOSIS — E119 Type 2 diabetes mellitus without complications: Secondary | ICD-10-CM

## 2023-08-10 DIAGNOSIS — N3281 Overactive bladder: Secondary | ICD-10-CM | POA: Insufficient documentation

## 2023-08-10 DIAGNOSIS — M810 Age-related osteoporosis without current pathological fracture: Secondary | ICD-10-CM | POA: Diagnosis present

## 2023-08-10 DIAGNOSIS — E785 Hyperlipidemia, unspecified: Secondary | ICD-10-CM | POA: Diagnosis present

## 2023-08-10 DIAGNOSIS — K589 Irritable bowel syndrome without diarrhea: Secondary | ICD-10-CM | POA: Diagnosis not present

## 2023-08-10 DIAGNOSIS — R7989 Other specified abnormal findings of blood chemistry: Secondary | ICD-10-CM | POA: Insufficient documentation

## 2023-08-10 DIAGNOSIS — I2489 Other forms of acute ischemic heart disease: Secondary | ICD-10-CM | POA: Diagnosis present

## 2023-08-10 DIAGNOSIS — M4120 Other idiopathic scoliosis, site unspecified: Secondary | ICD-10-CM | POA: Diagnosis present

## 2023-08-10 DIAGNOSIS — I440 Atrioventricular block, first degree: Secondary | ICD-10-CM | POA: Diagnosis present

## 2023-08-10 DIAGNOSIS — Z7901 Long term (current) use of anticoagulants: Secondary | ICD-10-CM

## 2023-08-10 DIAGNOSIS — Z885 Allergy status to narcotic agent status: Secondary | ICD-10-CM

## 2023-08-10 DIAGNOSIS — M7062 Trochanteric bursitis, left hip: Secondary | ICD-10-CM

## 2023-08-10 DIAGNOSIS — J9601 Acute respiratory failure with hypoxia: Secondary | ICD-10-CM

## 2023-08-10 DIAGNOSIS — K5730 Diverticulosis of large intestine without perforation or abscess without bleeding: Secondary | ICD-10-CM | POA: Diagnosis not present

## 2023-08-10 DIAGNOSIS — I2510 Atherosclerotic heart disease of native coronary artery without angina pectoris: Secondary | ICD-10-CM | POA: Diagnosis present

## 2023-08-10 DIAGNOSIS — I272 Pulmonary hypertension, unspecified: Secondary | ICD-10-CM | POA: Diagnosis present

## 2023-08-10 DIAGNOSIS — R0902 Hypoxemia: Secondary | ICD-10-CM | POA: Diagnosis present

## 2023-08-10 DIAGNOSIS — K5732 Diverticulitis of large intestine without perforation or abscess without bleeding: Principal | ICD-10-CM | POA: Diagnosis present

## 2023-08-10 DIAGNOSIS — R339 Retention of urine, unspecified: Secondary | ICD-10-CM | POA: Diagnosis present

## 2023-08-10 DIAGNOSIS — Z79899 Other long term (current) drug therapy: Secondary | ICD-10-CM

## 2023-08-10 DIAGNOSIS — N179 Acute kidney failure, unspecified: Secondary | ICD-10-CM | POA: Diagnosis present

## 2023-08-10 DIAGNOSIS — I10 Essential (primary) hypertension: Secondary | ICD-10-CM | POA: Diagnosis present

## 2023-08-10 DIAGNOSIS — I5032 Chronic diastolic (congestive) heart failure: Secondary | ICD-10-CM | POA: Diagnosis present

## 2023-08-10 DIAGNOSIS — E1122 Type 2 diabetes mellitus with diabetic chronic kidney disease: Secondary | ICD-10-CM | POA: Diagnosis present

## 2023-08-10 DIAGNOSIS — I5033 Acute on chronic diastolic (congestive) heart failure: Secondary | ICD-10-CM | POA: Diagnosis present

## 2023-08-10 DIAGNOSIS — D539 Nutritional anemia, unspecified: Secondary | ICD-10-CM | POA: Insufficient documentation

## 2023-08-10 DIAGNOSIS — F411 Generalized anxiety disorder: Secondary | ICD-10-CM | POA: Diagnosis present

## 2023-08-10 DIAGNOSIS — K5792 Diverticulitis of intestine, part unspecified, without perforation or abscess without bleeding: Secondary | ICD-10-CM | POA: Diagnosis not present

## 2023-08-10 DIAGNOSIS — Z882 Allergy status to sulfonamides status: Secondary | ICD-10-CM

## 2023-08-10 DIAGNOSIS — J40 Bronchitis, not specified as acute or chronic: Secondary | ICD-10-CM | POA: Diagnosis not present

## 2023-08-10 DIAGNOSIS — I517 Cardiomegaly: Secondary | ICD-10-CM | POA: Diagnosis not present

## 2023-08-10 DIAGNOSIS — I130 Hypertensive heart and chronic kidney disease with heart failure and stage 1 through stage 4 chronic kidney disease, or unspecified chronic kidney disease: Principal | ICD-10-CM | POA: Diagnosis present

## 2023-08-10 DIAGNOSIS — I509 Heart failure, unspecified: Secondary | ICD-10-CM

## 2023-08-10 DIAGNOSIS — N182 Chronic kidney disease, stage 2 (mild): Secondary | ICD-10-CM | POA: Insufficient documentation

## 2023-08-10 DIAGNOSIS — Z1152 Encounter for screening for COVID-19: Secondary | ICD-10-CM

## 2023-08-10 DIAGNOSIS — I7300 Raynaud's syndrome without gangrene: Secondary | ICD-10-CM | POA: Diagnosis present

## 2023-08-10 DIAGNOSIS — Z7984 Long term (current) use of oral hypoglycemic drugs: Secondary | ICD-10-CM

## 2023-08-10 DIAGNOSIS — H9190 Unspecified hearing loss, unspecified ear: Secondary | ICD-10-CM | POA: Diagnosis present

## 2023-08-10 DIAGNOSIS — Z888 Allergy status to other drugs, medicaments and biological substances status: Secondary | ICD-10-CM

## 2023-08-10 DIAGNOSIS — M545 Low back pain, unspecified: Secondary | ICD-10-CM | POA: Insufficient documentation

## 2023-08-10 DIAGNOSIS — K219 Gastro-esophageal reflux disease without esophagitis: Secondary | ICD-10-CM | POA: Diagnosis present

## 2023-08-10 DIAGNOSIS — Z8249 Family history of ischemic heart disease and other diseases of the circulatory system: Secondary | ICD-10-CM

## 2023-08-10 DIAGNOSIS — J42 Unspecified chronic bronchitis: Secondary | ICD-10-CM | POA: Diagnosis present

## 2023-08-10 DIAGNOSIS — Z955 Presence of coronary angioplasty implant and graft: Secondary | ICD-10-CM

## 2023-08-10 DIAGNOSIS — I480 Paroxysmal atrial fibrillation: Secondary | ICD-10-CM | POA: Diagnosis present

## 2023-08-10 DIAGNOSIS — R42 Dizziness and giddiness: Secondary | ICD-10-CM

## 2023-08-10 DIAGNOSIS — G8929 Other chronic pain: Secondary | ICD-10-CM | POA: Diagnosis present

## 2023-08-10 DIAGNOSIS — G2581 Restless legs syndrome: Secondary | ICD-10-CM | POA: Diagnosis present

## 2023-08-10 LAB — TROPONIN I (HIGH SENSITIVITY): Troponin I (High Sensitivity): 137 ng/L (ref <18)

## 2023-08-10 LAB — URINALYSIS, ROUTINE W REFLEX MICROSCOPIC
Bacteria, UA: NONE SEEN
Bilirubin Urine: NEGATIVE
Glucose, UA: 250 mg/dL — AB
Hgb urine dipstick: NEGATIVE
Ketones, ur: NEGATIVE mg/dL
Nitrite: NEGATIVE
Protein, ur: NEGATIVE mg/dL
Specific Gravity, Urine: 1.017 (ref 1.005–1.030)
pH: 5.5 (ref 5.0–8.0)

## 2023-08-10 LAB — COMPREHENSIVE METABOLIC PANEL
ALT: 12 U/L (ref 0–44)
AST: 22 U/L (ref 15–41)
Albumin: 4 g/dL (ref 3.5–5.0)
Alkaline Phosphatase: 90 U/L (ref 38–126)
Anion gap: 10 (ref 5–15)
BUN: 30 mg/dL — ABNORMAL HIGH (ref 8–23)
CO2: 30 mmol/L (ref 22–32)
Calcium: 9.2 mg/dL (ref 8.9–10.3)
Chloride: 93 mmol/L — ABNORMAL LOW (ref 98–111)
Creatinine, Ser: 1.06 mg/dL — ABNORMAL HIGH (ref 0.44–1.00)
GFR, Estimated: 50 mL/min — ABNORMAL LOW (ref >60)
Glucose, Bld: 191 mg/dL — ABNORMAL HIGH (ref 70–99)
Potassium: 4.5 mmol/L (ref 3.5–5.1)
Sodium: 133 mmol/L — ABNORMAL LOW (ref 135–145)
Total Bilirubin: 0.5 mg/dL (ref 0.3–1.2)
Total Protein: 6.8 g/dL (ref 6.5–8.1)

## 2023-08-10 LAB — CBC
HCT: 31.8 % — ABNORMAL LOW (ref 36.0–46.0)
Hemoglobin: 10.5 g/dL — ABNORMAL LOW (ref 12.0–15.0)
MCH: 35.4 pg — ABNORMAL HIGH (ref 26.0–34.0)
MCHC: 33 g/dL (ref 30.0–36.0)
MCV: 107.1 fL — ABNORMAL HIGH (ref 80.0–100.0)
Platelets: 275 10*3/uL (ref 150–400)
RBC: 2.97 MIL/uL — ABNORMAL LOW (ref 3.87–5.11)
RDW: 12.8 % (ref 11.5–15.5)
WBC: 13.8 10*3/uL — ABNORMAL HIGH (ref 4.0–10.5)
nRBC: 0 % (ref 0.0–0.2)

## 2023-08-10 LAB — BRAIN NATRIURETIC PEPTIDE: B Natriuretic Peptide: 842 pg/mL — ABNORMAL HIGH (ref 0.0–100.0)

## 2023-08-10 LAB — SARS CORONAVIRUS 2 BY RT PCR: SARS Coronavirus 2 by RT PCR: NEGATIVE

## 2023-08-10 LAB — LIPASE, BLOOD: Lipase: 19 U/L (ref 11–51)

## 2023-08-10 MED ORDER — HYDROMORPHONE HCL 1 MG/ML IJ SOLN
0.5000 mg | Freq: Once | INTRAMUSCULAR | Status: AC
  Administered 2023-08-10: 0.5 mg via INTRAVENOUS
  Filled 2023-08-10: qty 1

## 2023-08-10 MED ORDER — SODIUM CHLORIDE 0.9 % IV SOLN
3.0000 g | Freq: Once | INTRAVENOUS | Status: AC
  Administered 2023-08-10: 3 g via INTRAVENOUS

## 2023-08-10 MED ORDER — FUROSEMIDE 10 MG/ML IJ SOLN: 40.0000 mg | Freq: Once | INTRAMUSCULAR | Status: DC

## 2023-08-10 MED ORDER — IOHEXOL 300 MG/ML  SOLN
100.0000 mL | Freq: Once | INTRAMUSCULAR | Status: AC | PRN
  Administered 2023-08-10: 75 mL via INTRAVENOUS

## 2023-08-10 MED ORDER — AMOXICILLIN-POT CLAVULANATE 875-125 MG PO TABS: 1.0000 | ORAL_TABLET | Freq: Two times a day (BID) | ORAL | 0 refills | Status: AC

## 2023-08-10 NOTE — Discharge Instructions (Addendum)
We evaluated you for your abdominal pain.  Your CT scan shows diverticulitis.  We have prescribed you antibiotics.  Please take this medication as prescribed.  Please keep a close eye on your symptoms, return if you have any new or worsening symptoms, vomiting, worsening abdominal pain, fevers or chills, chest pain or shortness of breath, or any other new symptoms.  Since you have had some weight gain, please take increased dose of your Lasix as prescribed by your cardiologist.  Please also return if you develop any new or worsening symptoms such as leg swelling, shortness of breath, or any other concerning symptoms.

## 2023-08-10 NOTE — ED Triage Notes (Signed)
Pt presents with abd pain, low grade temp (TMax 100.3), and diarrhea x 3 days. Pt was seen here on 9/10 for urinary symptoms. Pt with hx of diverticulitis and IBS - mixed type.

## 2023-08-10 NOTE — ED Provider Notes (Addendum)
[place] EMERGENCY DEPARTMENT AT [facility] Provider Note  CSN: [id number] Arrival date & time: 08/10/23 1629  Chief Complaint(s) Abdominal Pain  HPI Heather Olsen is a 89 y.o. female Struve coronary artery disease, hyperlipidemia, atrial fibrillation, diabetes, pulmonary hypertension presenting to the emergency department with abdominal pain.  She reports that feels similar to previous episodes of diverticulitis.  She reports a fever to 100.2 earlier today.  No nausea or vomiting.  Reports some generalized weakness and fatigue.  No chest pain, cough.  Reports mild shortness of breath which she has at baseline. Does report some runny nose.  No syncope.  No urinary symptoms like dysuria.  Was in the emergency department a couple days ago, had a CT scan which was negative but symptoms have worsened since then.   Past Medical History Past Medical History:  Diagnosis Date  . Aortic stenosis 04/12/2016   none noted on echo done 08/13/20  . Coronary artery disease   . Depression   . Diverticulosis   . Hearing loss 04/12/2016  . Hiatal hernia 04/12/2016  . HLD (hyperlipidemia) 04/12/2016  . Hyperglycemia 09/14/2016  . Hypertension   . Idiopathic scoliosis 04/12/2016  . Major depression 04/12/2016  . Mitral regurgitation   . Osteoporosis, senile 04/12/2016  . Pernicious anemia   . Raynaud's disease 04/12/2016  . Scoliosis   . Spinal stenosis of lumbar region 04/12/2016   Patient Active Problem List   Diagnosis Date Noted  . Hypoxia 08/10/2023  . AKI (acute kidney injury) on CKD stage II (HCC) 07/27/2023  . GAD (generalized anxiety disorder) 07/27/2023  . First degree AV block 07/27/2023  . Acute on chronic heart failure with preserved ejection fraction (HFpEF) (HCC) 07/26/2023  . CHF (congestive heart failure) (HCC) 08/31/2022  . Acute on chronic diastolic CHF (congestive heart failure) (HCC) 08/30/2022  . Acute diverticulitis 08/25/2022  . Chronic diastolic CHF (congestive heart  failure) (HCC) 08/25/2022  . Unintentional weight loss 08/25/2022  . Metatarsalgia, left foot 02/25/2022  . Anxiety 01/25/2022  . Calcium pyrophosphate arthropathy of multiple sites 01/25/2022  . Chronic insomnia 01/25/2022  . Easy bruising 01/25/2022  . Encounter for general adult medical examination without abnormal findings 01/25/2022  . Gastroesophageal reflux disease 01/25/2022  . History of depression 01/25/2022  . History of diverticulitis 01/25/2022  . Hyperglycemia due to type 2 diabetes mellitus (HCC) 01/25/2022  . Irritable bowel syndrome with diarrhea 01/25/2022  . Long term (current) use of anticoagulants - on Eliquis for PAF 01/25/2022  . Long term (current) use of aspirin 01/25/2022  . Memory loss 01/25/2022  . Constipation 01/25/2022  . Night sweats 01/25/2022  . Nocturia 01/25/2022  . Other idiopathic scoliosis, thoracolumbar region 01/25/2022  . Other specified postprocedural states 01/25/2022  . Pain in thoracic spine 01/25/2022  . Presence of coronary angioplasty implant and graft 01/25/2022  . Primary thrombophilia (HCC) 01/25/2022  . Pseudogout 01/25/2022  . Pure hypercholesterolemia 01/25/2022  . Raynaud's phenomenon 01/25/2022  . Recurrent major depression in remission (HCC) 01/25/2022  . Restless leg syndrome 01/25/2022  . Senile hyperkeratosis 01/25/2022  . Trochanteric bursitis of left hip 01/25/2022  . Dermatochalasia 09/28/2021  . DM (diabetes mellitus) type II controlled with renal manifestation (HCC) 08/05/2021  . Pulmonary HTN (HCC) 04/11/2021  . Paroxysmal atrial fibrillation (HCC) 05/31/2020  . Dyspnea 12/29/2019  . Degeneration of lumbar intervertebral disc 06/25/2019  . Degenerative spondylolisthesis 06/25/2019  . Osteopenia 06/25/2019  . Atherosclerotic heart disease of native coronary artery without angina pectoris 03/01/2019  .  Leg swelling 03/01/2019  . Dyslipidemia 03/01/2019  . Bilateral carpal tunnel syndrome 03/27/2017  . Urgency  of micturition 10/11/2016  . Hyperglycemia 09/14/2016  . Tricuspid insufficiency 06/27/2016  . Arteriosclerosis of coronary artery 04/12/2016  . HLD (hyperlipidemia) 04/12/2016  . Major depression 04/12/2016  . Idiopathic scoliosis 04/12/2016  . Spinal stenosis of lumbar region 04/12/2016  . Aortic stenosis 04/12/2016  . Mitral regurgitation 04/12/2016  . Pernicious anemia 04/12/2016  . Hearing loss 04/12/2016  . Glaucoma 04/12/2016  . Palpitations 04/12/2016  . Hiatal hernia 04/12/2016  . Back pain 04/12/2016  . Osteoporosis, senile 04/12/2016  . Anemia due to vitamin B12 deficiency 04/12/2016  . Raynaud's disease 04/12/2016  . Balance disorder 04/12/2016  . Beat, premature ventricular 01/08/2016  . APC (atrial premature contractions) 06/16/2014  . Fatigue 06/17/2013  . Essential hypertension 06/17/2013   Home Medication(s) Prior to Admission medications   Medication Sig Start Date End Date Taking? Authorizing Provider  amoxicillin-clavulanate (AUGMENTIN) 875-125 MG tablet Take 1 tablet by mouth every 12 (twelve) hours for 10 days. 08/10/23 08/20/23 Yes Lonell Grandchild, MD  acetaminophen (TYLENOL) 500 MG tablet Take 500-1,000 mg by mouth every 6 (six) hours as needed for moderate pain.    [provider]  Cholecalciferol (VITAMIN D3) 2000 units capsule Take 2,000 Units by mouth daily.     [provider]  denosumab (PROLIA) 60 MG/ML SOSY injection Inject 60 mg into the skin every 6 (six) months. 07/25/19   [provider]  diclofenac Sodium (VOLTAREN) 1 % GEL Apply topically 4 (four) times daily.    [provider]  dicyclomine (BENTYL) 20 MG tablet Take 1 tablet (20 mg total) by mouth 2 (two) times daily. 05/03/23   Fondaw, Wylder S, PA  ELIQUIS 2.5 MG TABS tablet Take 2.5 mg by mouth 2 (two) times daily. 02/03/20   [provider]  furosemide (LASIX) 10 MG/ML solution Take 0.5 mLs (5 mg total) by mouth daily as needed for fluid or edema  (take 5 mg for a weight gain of 2 pounds or more overnight). 08/08/23   Rollene Rotunda, MD  ipratropium (ATROVENT) 0.06 % nasal spray Use 2 sprays in each nostril up to three times daily before meals for sinus drainage. Patient taking differently: Place 1 spray into the nose daily. 09/30/16   Kimber Relic, MD  LINZESS 145 MCG CAPS capsule Take 145 mcg by mouth daily as needed (constipation). 07/29/22   [provider]  metFORMIN (GLUCOPHAGE-XR) 500 MG 24 hr tablet Take 1,000 mg by mouth every evening. 02/01/22   [provider]  mirabegron ER (MYRBETRIQ) 50 MG TB24 tablet Take 50 mg by mouth daily. 08/11/21   [provider]  MULTIPLE VITAMINS-MINERALS PO Take 1 tablet by mouth daily.     [provider]  nitroGLYCERIN (NITROSTAT) 0.4 MG SL tablet Place 1 tablet (0.4 mg total) under the tongue every 5 (five) minutes as needed for chest pain. Place one tablet under the tongue every 5 minutes as needed for chest pain. No more than 3 08/09/22   Rollene Rotunda, MD  ondansetron (ZOFRAN) 4 MG tablet Take 1 tablet (4 mg total) by mouth every 6 (six) hours as needed for nausea or vomiting. 07/28/23   Rai, Ripudeep K, MD  pantoprazole (PROTONIX) 40 MG tablet Take 40 mg by mouth 2 (two) times daily. 12/18/19   [provider]  pindolol (VISKEN) 5 MG tablet Take 1/2 (one-half) tablet by mouth twice daily Patient taking differently:  Take 2.5 mg by mouth 2 (two) times daily. 07/04/23   Rollene Rotunda, MD  Probiotic Product (PROBIOTIC DAILY PO) Take 1 capsule by mouth every evening.    [provider]  rOPINIRole (REQUIP) 0.5 MG tablet Take 0.5 mg by mouth at bedtime. 07/13/22   [provider]  rosuvastatin (CRESTOR) 10 MG tablet TAKE 1 TABLET BY MOUTH AT BEDTIME Patient taking differently: Take 10 mg by mouth daily. 11/23/20   Maeola Harman, MD  sertraline (ZOLOFT) 25 MG tablet Take 25 mg by mouth at bedtime. 08/10/22   [provider]   traMADol (ULTRAM) 50 MG tablet TAKE 1 TABLET BY MOUTH EVERY 4 HOURS Patient taking differently: Take 50 mg by mouth every 12 (twelve) hours as needed for moderate pain or severe pain (pain). 11/14/16   Kermit Balo, DO                                                                                                                                    Past Surgical History Past Surgical History:  Procedure Laterality Date  . APPENDECTOMY    . BREAST SURGERY    . broken wrist Right 2007  . CARPAL TUNNEL RELEASE Left 04/04/2017   Procedure: LEFT CARPAL TUNNEL RELEASE;  Surgeon: Cindee Salt, MD;  Location: [facility];  Service: Orthopedics;  Laterality: Left;  REG/FAB  . CARPAL TUNNEL RELEASE Right 12/01/2020   Procedure: CARPAL TUNNEL RELEASE;  Surgeon: Cindee Salt, MD;  Location: [facility];  Service: Orthopedics;  Laterality: Right;  IV REGIONAL FOREARM BLOCK  . CATARACT EXTRACTION W/ INTRAOCULAR LENS  IMPLANT, BILATERAL Bilateral 2017  . COLONOSCOPY  2016  . FOOT SURGERY Right   . PERIPHERAL VASCULAR INTERVENTION  11/11/2019   Procedure: PERIPHERAL VASCULAR INTERVENTION;  Surgeon: Maeola Harman, MD;  Location: [facility] INVASIVE CV LAB;  Service: Cardiovascular;;  . TONSILLECTOMY    . VISCERAL ANGIOGRAPHY N/A 11/11/2019   Procedure: VISCERAL ANGIOGRAPHY;  Surgeon: Maeola Harman, MD;  Location: [facility] INVASIVE CV LAB;  Service: Cardiovascular;  Laterality: N/A;   Family History Family History  Problem Relation Age of Onset  . Heart disease Mother        Died 82, MR, no CAD  . Heart disease Father        CHF, no CAD..   . HIV Daughter 44       from bone tissue transplant    Social History Social History   Tobacco Use  . Smoking status: Never    Passive exposure: Never  . Smokeless tobacco: Never  Vaping Use  . Vaping status: Never Used  Substance Use Topics  . Alcohol use: Yes    Comment: wine 3-4 times a week  . Drug use: No    Allergies Sulfasalazine, Topiramate, Ace inhibitors, Codeine, Ibandronic acid, Meloxicam, Prevacid [lansoprazole], Sulfa antibiotics, Verapamil, and Zolpidem  Review of Systems Review of Systems  All other systems reviewed and are negative.   Physical Exam Vital Signs  I have reviewed the triage vital signs BP 126/64   Pulse 74   Temp 99.3 F (37.4 C) (Oral)   Resp (!) 24   Ht 5\' 2"  (1.575 m)   Wt 49.4 kg   SpO2 100%   BMI 19.94 kg/m  Physical Exam Vitals and nursing note reviewed.  Constitutional:      General: She is not in acute distress.    Appearance: She is well-developed.  HENT:     Head: Normocephalic and atraumatic.     Mouth/Throat:     Mouth: Mucous membranes are moist.  Eyes:     Pupils: Pupils are equal, round, and reactive to light.  Cardiovascular:     Rate and Rhythm: Normal rate and regular rhythm.     Heart sounds: No murmur heard. Pulmonary:     Effort: Pulmonary effort is normal. No respiratory distress.     Breath sounds: Normal breath sounds.  Abdominal:     General: Abdomen is flat.     Palpations: Abdomen is soft.     Tenderness: There is abdominal tenderness in the left lower quadrant.  Musculoskeletal:        General: No tenderness.     Right lower leg: No edema.     Left lower leg: No edema.  Skin:    General: Skin is warm and dry.  Neurological:     General: No focal deficit present.     Mental Status: She is alert. Mental status is at baseline.  Psychiatric:        Mood and Affect: Mood normal.        Behavior: Behavior normal.     ED Results and Treatments Labs (all labs ordered are listed, but only abnormal results are displayed) Labs Reviewed  COMPREHENSIVE METABOLIC PANEL - Abnormal; Notable for the following components:      Result Value   Sodium 133 (*)    Chloride 93 (*)    Glucose, Bld 191 (*)    BUN 30 (*)    Creatinine, Ser 1.06 (*)    GFR, Estimated 50 (*)    All other components within normal limits   CBC - Abnormal; Notable for the following components:   WBC 13.8 (*)    RBC 2.97 (*)    Hemoglobin 10.5 (*)    HCT 31.8 (*)    MCV 107.1 (*)    MCH 35.4 (*)    All other components within normal limits  URINALYSIS, ROUTINE W REFLEX MICROSCOPIC - Abnormal; Notable for the following components:   Glucose, UA 250 (*)    Leukocytes,Ua SMALL (*)    All other components within normal limits  BRAIN NATRIURETIC PEPTIDE - Abnormal; Notable for the following components:   B Natriuretic Peptide 842.0 (*)    All other components within normal limits  TROPONIN I (HIGH SENSITIVITY) - Abnormal; Notable for the following components:   Troponin I (High Sensitivity) 137 (*)    All other components within normal limits  SARS CORONAVIRUS 2 BY RT PCR  LIPASE, BLOOD  Radiology DG Chest Portable 1 View  Result Date: 08/10/2023 CLINICAL DATA:  Hypoxia EXAM: PORTABLE CHEST 1 VIEW COMPARISON:  07/26/2023 FINDINGS: Marked scoliosis. Cardiomegaly. Mild diffuse bronchitic changes. Atelectasis left base. Aortic atherosclerosis. No consolidation or pleural effusion. No pneumothorax. IMPRESSION: 1. Cardiomegaly. 2. Mild bronchitic changes. Electronically Signed   By: Jasmine Pang M.D.   On: 08/10/2023 23:40   CT ABDOMEN PELVIS W CONTRAST  Result Date: 08/10/2023 CLINICAL DATA:  Left lower quadrant abdominal pain and diarrhea. History of diverticulitis and IBS. EXAM: CT ABDOMEN AND PELVIS WITH CONTRAST TECHNIQUE: Multidetector CT imaging of the abdomen and pelvis was performed using the standard protocol following bolus administration of intravenous contrast. RADIATION DOSE REDUCTION: This exam was performed according to the departmental dose-optimization program which includes automated exposure control, adjustment of the mA and/or kV according to patient size and/or use of iterative reconstruction  technique. CONTRAST:  75mL OMNIPAQUE IOHEXOL 300 MG/ML  SOLN COMPARISON:  08/09/2023, 07/18/2023. FINDINGS: Lower chest: The heart is enlarged. Mild atelectasis or scarring is noted at the lung bases. Hepatobiliary: There is a vague hypodensity in the anterior right lobe of the liver measuring 1.6 x 1.1 cm, previously characterized as hemangioma. No biliary ductal dilatation. The gallbladder is without stones. Pancreas: Pancreatic atrophy is noted. No pancreatic ductal dilatation or surrounding inflammatory changes. Spleen: Normal in size without focal abnormality. Adrenals/Urinary Tract: The adrenal glands are within normal limits. The kidneys enhance symmetrically. Subcentimeter hypodensities are noted in the kidneys bilaterally which are too small to further characterize. No renal calculus or hydronephrosis. The bladder is unremarkable. Stomach/Bowel: Stomach is within normal limits. Appendix is not seen. No bowel obstruction, free air or pneumatosis. Scattered diverticula are present along the colon. There is bowel wall thickening with surrounding inflammatory changes involving the sigmoid colon and rectum. Vascular/Lymphatic: Aortic atherosclerosis. A vascular stent is noted in the SMA. No abdominal or pelvic lymphadenopathy. Reproductive: Uterus and bilateral adnexa are unremarkable. Other: No abdominopelvic ascites. Musculoskeletal: There scoliosis of the thoracolumbar spine with degenerative changes. No acute osseous abnormality. IMPRESSION: 1. Colonic diverticulosis. There is bowel wall thickening with surrounding inflammatory changes at the rectosigmoid colon, possible diverticulitis versus superimposed infectious or inflammatory colitis. 2. Aortic atherosclerosis. Electronically Signed   By: Thornell Sartorius M.D.   On: 08/10/2023 20:29    Pertinent labs & imaging results that were available during my care of the patient were reviewed by me and considered in my medical decision making (see MDM for  details).  Medications Ordered in ED Medications  furosemide (LASIX) injection 40 mg (has no administration in time range)  HYDROmorphone (DILAUDID) injection 0.5 mg (0.5 mg Intravenous Given 08/10/23 1809)  iohexol (OMNIPAQUE) 300 MG/ML solution 100 mL (75 mLs Intravenous Contrast Given 08/10/23 1829)  Ampicillin-Sulbactam (UNASYN) 3 g in sodium chloride 0.9 % 100 mL IVPB (0 g Intravenous Stopped 08/10/23 2244)  Procedures .Critical Care  Performed by: Lonell Grandchild, MD Authorized by: Lonell Grandchild, MD   Critical care provider statement:    Critical care time (minutes):  30   Critical care was necessary to treat or prevent imminent or life-threatening deterioration of the following conditions:  Respiratory failure   Critical care was time spent personally by me on the following activities:  Development of treatment plan with patient or surrogate, discussions with consultants, evaluation of patient's response to treatment, examination of patient, ordering and review of laboratory studies, ordering and review of radiographic studies, ordering and performing treatments and interventions, pulse oximetry, re-evaluation of patient's condition and review of old charts   Care discussed with: admitting provider and accepting provider at another facility     (including critical care time)  Medical Decision Making / ED Course   MDM:  89 year old female presenting to the emergency department with abdominal pain.  Patient overall very well-appearing, vitals reassuring, no tachycardia or fever in the emergency department.  Lungs clear.  Differential includes diverticulitis, abscess, urinary pathology such as urinary infection or pyelonephritis, less likely perforation, abscess, obstruction, appendicitis.  Will check labs including CMP, CBC, lipase,  urinalysis.  Will also obtain CT abdomen, she did have a scan a couple days ago but there is no contrast and symptoms are worsened since then.  Will reassess.  Clinical Course as of 08/10/23 2352  Thu Aug 10, 2023  2319 CT scan does show diverticulitis.  Given IV Unasyn.  Plan to discharge on Augmentin however patient was noted to have hypoxia into the 80s.  Patient had good waveform.  Pulse ox was tried on multiple sites including earlobe.  Patient did admit to having some shortness of breath.  She has gained weight in the past few days.  She has a history of pulmonary hypertension.  Her BNP is significantly elevated compared to a few weeks ago.  She has no chest pain to suggest ACS.  Suspect likely exacerbation of pulmonary hypertension/CHF.  Discussed with hospitalist who will admit the patient. [WS]  2351 Troponin I (High Sensitivity)(!!): 137 Elevated. ECG not yet obtained. Patient has no chest pain. Dr. Eudelia Bunch will follow up ECG. Suspect most likely demand related. [WS]    Clinical Course User Index [WS] Lonell Grandchild, MD     Additional history obtained: -Additional history obtained from family -External records from outside source obtained and reviewed including: Chart review including previous notes, labs, imaging, consultation notes including prior admission for hypoxic respiratory failure, prior echo   Lab Tests: -I ordered, reviewed, and interpreted labs.   The pertinent results include:   Labs Reviewed  COMPREHENSIVE METABOLIC PANEL - Abnormal; Notable for the following components:      Result Value   Sodium 133 (*)    Chloride 93 (*)    Glucose, Bld 191 (*)    BUN 30 (*)    Creatinine, Ser 1.06 (*)    GFR, Estimated 50 (*)    All other components within normal limits  CBC - Abnormal; Notable for the following components:   WBC 13.8 (*)    RBC 2.97 (*)    Hemoglobin 10.5 (*)    HCT 31.8 (*)    MCV 107.1 (*)    MCH 35.4 (*)    All other components within normal  limits  URINALYSIS, ROUTINE W REFLEX MICROSCOPIC - Abnormal; Notable for the following components:   Glucose, UA 250 (*)    Leukocytes,Ua SMALL (*)  All other components within normal limits  BRAIN NATRIURETIC PEPTIDE - Abnormal; Notable for the following components:   B Natriuretic Peptide 842.0 (*)    All other components within normal limits  TROPONIN I (HIGH SENSITIVITY) - Abnormal; Notable for the following components:   Troponin I (High Sensitivity) 137 (*)    All other components within normal limits  SARS CORONAVIRUS 2 BY RT PCR  LIPASE, BLOOD    Notable for elevated BNP, leukocytosis  Imaging Studies ordered: I ordered imaging studies including CT abdomen On my interpretation imaging demonstrates diverticulitis I independently visualized and interpreted imaging. I agree with the radiologist interpretation   Medicines ordered and prescription drug management: Meds ordered this encounter  Medications  . HYDROmorphone (DILAUDID) injection 0.5 mg  . iohexol (OMNIPAQUE) 300 MG/ML solution 100 mL  . Ampicillin-Sulbactam (UNASYN) 3 g in sodium chloride 0.9 % 100 mL IVPB    Order Specific Question:   Antibiotic Indication:    Answer:   Intra-abdominal Infection  . amoxicillin-clavulanate (AUGMENTIN) 875-125 MG tablet    Sig: Take 1 tablet by mouth every 12 (twelve) hours for 10 days.    Dispense:  20 tablet    Refill:  0  . furosemide (LASIX) injection 40 mg    -I have reviewed the patients home medicines and have made adjustments as needed   Consultations Obtained: I requested consultation with the hospitalist,  and discussed lab and imaging findings as well as pertinent plan - they recommend: admission   Cardiac Monitoring: The patient was maintained on a cardiac monitor.  I personally viewed and interpreted the cardiac monitored which showed an underlying rhythm of: NSR  Social Determinants of Health:  Diagnosis or treatment significantly limited by social  determinants of health: lives alone   Reevaluation: After the interventions noted above, I reevaluated the patient and found that their symptoms have improved  Co morbidities that complicate the patient evaluation . Past Medical History:  Diagnosis Date  . Aortic stenosis 04/12/2016   none noted on echo done 08/13/20  . Coronary artery disease   . Depression   . Diverticulosis   . Hearing loss 04/12/2016  . Hiatal hernia 04/12/2016  . HLD (hyperlipidemia) 04/12/2016  . Hyperglycemia 09/14/2016  . Hypertension   . Idiopathic scoliosis 04/12/2016  . Major depression 04/12/2016  . Mitral regurgitation   . Osteoporosis, senile 04/12/2016  . Pernicious anemia   . Raynaud's disease 04/12/2016  . Scoliosis   . Spinal stenosis of lumbar region 04/12/2016      Dispostion: Disposition decision including need for hospitalization was considered, and patient admitted to the hospital.    Final Clinical Impression(s) / ED Diagnoses Final diagnoses:  Diverticulitis  Hypoxia     This chart was dictated using voice recognition software.  Despite best efforts to proofread,  errors can occur which can change the documentation meaning.    Lonell Grandchild, MD 08/10/23 2322    Lonell Grandchild, MD 08/10/23 2322    Lonell Grandchild, MD 08/10/23 2352

## 2023-08-10 NOTE — ED Notes (Signed)
Trial on RA per MD verbal orders, pt desaturations in mid 80s. Cont to require 2L [place].

## 2023-08-11 ENCOUNTER — Encounter (HOSPITAL_COMMUNITY): Payer: Self-pay | Admitting: Internal Medicine

## 2023-08-11 DIAGNOSIS — I10 Essential (primary) hypertension: Secondary | ICD-10-CM

## 2023-08-11 DIAGNOSIS — I480 Paroxysmal atrial fibrillation: Secondary | ICD-10-CM | POA: Diagnosis present

## 2023-08-11 DIAGNOSIS — I509 Heart failure, unspecified: Secondary | ICD-10-CM | POA: Diagnosis present

## 2023-08-11 DIAGNOSIS — G2581 Restless legs syndrome: Secondary | ICD-10-CM | POA: Diagnosis present

## 2023-08-11 DIAGNOSIS — N179 Acute kidney failure, unspecified: Secondary | ICD-10-CM

## 2023-08-11 DIAGNOSIS — I130 Hypertensive heart and chronic kidney disease with heart failure and stage 1 through stage 4 chronic kidney disease, or unspecified chronic kidney disease: Secondary | ICD-10-CM | POA: Diagnosis present

## 2023-08-11 DIAGNOSIS — I2510 Atherosclerotic heart disease of native coronary artery without angina pectoris: Secondary | ICD-10-CM | POA: Diagnosis present

## 2023-08-11 DIAGNOSIS — F411 Generalized anxiety disorder: Secondary | ICD-10-CM | POA: Diagnosis present

## 2023-08-11 DIAGNOSIS — G8929 Other chronic pain: Secondary | ICD-10-CM | POA: Diagnosis present

## 2023-08-11 DIAGNOSIS — K5732 Diverticulitis of large intestine without perforation or abscess without bleeding: Secondary | ICD-10-CM | POA: Diagnosis present

## 2023-08-11 DIAGNOSIS — I7300 Raynaud's syndrome without gangrene: Secondary | ICD-10-CM | POA: Diagnosis present

## 2023-08-11 DIAGNOSIS — N182 Chronic kidney disease, stage 2 (mild): Secondary | ICD-10-CM | POA: Insufficient documentation

## 2023-08-11 DIAGNOSIS — J9601 Acute respiratory failure with hypoxia: Secondary | ICD-10-CM

## 2023-08-11 DIAGNOSIS — I491 Atrial premature depolarization: Secondary | ICD-10-CM | POA: Diagnosis present

## 2023-08-11 DIAGNOSIS — Z79899 Other long term (current) drug therapy: Secondary | ICD-10-CM | POA: Diagnosis not present

## 2023-08-11 DIAGNOSIS — I5033 Acute on chronic diastolic (congestive) heart failure: Secondary | ICD-10-CM | POA: Diagnosis present

## 2023-08-11 DIAGNOSIS — Z1152 Encounter for screening for COVID-19: Secondary | ICD-10-CM | POA: Diagnosis not present

## 2023-08-11 DIAGNOSIS — I440 Atrioventricular block, first degree: Secondary | ICD-10-CM | POA: Diagnosis present

## 2023-08-11 DIAGNOSIS — J42 Unspecified chronic bronchitis: Secondary | ICD-10-CM | POA: Diagnosis present

## 2023-08-11 DIAGNOSIS — D539 Nutritional anemia, unspecified: Secondary | ICD-10-CM | POA: Insufficient documentation

## 2023-08-11 DIAGNOSIS — E119 Type 2 diabetes mellitus without complications: Secondary | ICD-10-CM

## 2023-08-11 DIAGNOSIS — E785 Hyperlipidemia, unspecified: Secondary | ICD-10-CM | POA: Diagnosis present

## 2023-08-11 DIAGNOSIS — I5032 Chronic diastolic (congestive) heart failure: Secondary | ICD-10-CM | POA: Diagnosis not present

## 2023-08-11 DIAGNOSIS — I2489 Other forms of acute ischemic heart disease: Secondary | ICD-10-CM | POA: Diagnosis present

## 2023-08-11 DIAGNOSIS — K219 Gastro-esophageal reflux disease without esophagitis: Secondary | ICD-10-CM | POA: Diagnosis present

## 2023-08-11 DIAGNOSIS — K5792 Diverticulitis of intestine, part unspecified, without perforation or abscess without bleeding: Secondary | ICD-10-CM | POA: Diagnosis not present

## 2023-08-11 DIAGNOSIS — H9190 Unspecified hearing loss, unspecified ear: Secondary | ICD-10-CM | POA: Diagnosis present

## 2023-08-11 DIAGNOSIS — R42 Dizziness and giddiness: Secondary | ICD-10-CM

## 2023-08-11 DIAGNOSIS — N3281 Overactive bladder: Secondary | ICD-10-CM | POA: Diagnosis present

## 2023-08-11 DIAGNOSIS — I272 Pulmonary hypertension, unspecified: Secondary | ICD-10-CM | POA: Diagnosis present

## 2023-08-11 DIAGNOSIS — E1122 Type 2 diabetes mellitus with diabetic chronic kidney disease: Secondary | ICD-10-CM | POA: Diagnosis present

## 2023-08-11 DIAGNOSIS — M4120 Other idiopathic scoliosis, site unspecified: Secondary | ICD-10-CM | POA: Diagnosis present

## 2023-08-11 DIAGNOSIS — R7989 Other specified abnormal findings of blood chemistry: Secondary | ICD-10-CM | POA: Insufficient documentation

## 2023-08-11 LAB — COMPREHENSIVE METABOLIC PANEL
ALT: 17 U/L (ref 0–44)
AST: 27 U/L (ref 15–41)
Albumin: 3 g/dL — ABNORMAL LOW (ref 3.5–5.0)
Alkaline Phosphatase: 94 U/L (ref 38–126)
Anion gap: 10 (ref 5–15)
BUN: 24 mg/dL — ABNORMAL HIGH (ref 8–23)
CO2: 29 mmol/L (ref 22–32)
Calcium: 9 mg/dL (ref 8.9–10.3)
Chloride: 94 mmol/L — ABNORMAL LOW (ref 98–111)
Creatinine, Ser: 1.01 mg/dL — ABNORMAL HIGH (ref 0.44–1.00)
GFR, Estimated: 53 mL/min — ABNORMAL LOW (ref >60)
Glucose, Bld: 229 mg/dL — ABNORMAL HIGH (ref 70–99)
Potassium: 4 mmol/L (ref 3.5–5.1)
Sodium: 133 mmol/L — ABNORMAL LOW (ref 135–145)
Total Bilirubin: 0.7 mg/dL (ref 0.3–1.2)
Total Protein: 5.9 g/dL — ABNORMAL LOW (ref 6.5–8.1)

## 2023-08-11 LAB — FERRITIN: Ferritin: 35 ng/mL (ref 11–307)

## 2023-08-11 LAB — GLUCOSE, CAPILLARY
Glucose-Capillary: 212 mg/dL — ABNORMAL HIGH (ref 70–99)
Glucose-Capillary: 107 mg/dL — ABNORMAL HIGH (ref 70–99)
Glucose-Capillary: 169 mg/dL — ABNORMAL HIGH (ref 70–99)

## 2023-08-11 LAB — RETICULOCYTES
Immature Retic Fract: 13.1 % (ref 2.3–15.9)
RBC.: 2.63 MIL/uL — ABNORMAL LOW (ref 3.87–5.11)
Retic Count, Absolute: 47.3 10*3/uL (ref 19.0–186.0)
Retic Ct Pct: 1.8 % (ref 0.4–3.1)

## 2023-08-11 LAB — IRON AND TIBC
Iron: 22 ug/dL — ABNORMAL LOW (ref 28–170)
Saturation Ratios: 7 % — ABNORMAL LOW (ref 10.4–31.8)
TIBC: 312 ug/dL (ref 250–450)
UIBC: 290 ug/dL

## 2023-08-11 LAB — CBC
HCT: 28.4 % — ABNORMAL LOW (ref 36.0–46.0)
Hemoglobin: 9.1 g/dL — ABNORMAL LOW (ref 12.0–15.0)
MCH: 34.6 pg — ABNORMAL HIGH (ref 26.0–34.0)
MCHC: 32 g/dL (ref 30.0–36.0)
MCV: 108 fL — ABNORMAL HIGH (ref 80.0–100.0)
Platelets: 246 10*3/uL (ref 150–400)
RBC: 2.63 MIL/uL — ABNORMAL LOW (ref 3.87–5.11)
RDW: 12.7 % (ref 11.5–15.5)
WBC: 9.2 10*3/uL (ref 4.0–10.5)
nRBC: 0 % (ref 0.0–0.2)

## 2023-08-11 LAB — MAGNESIUM: Magnesium: 1.6 mg/dL — ABNORMAL LOW (ref 1.7–2.4)

## 2023-08-11 LAB — TROPONIN I (HIGH SENSITIVITY): Troponin I (High Sensitivity): 112 ng/L (ref <18)

## 2023-08-11 LAB — FOLATE: Folate: 31.2 ng/mL (ref >5.9)

## 2023-08-11 LAB — VITAMIN B12: Vitamin B-12: 542 pg/mL (ref 180–914)

## 2023-08-11 MED ORDER — ROPINIROLE HCL 1 MG PO TABS
0.5000 mg | ORAL_TABLET | Freq: Every day | ORAL | Status: DC
  Administered 2023-08-11: 0.5 mg via ORAL
  Filled 2023-08-11: qty 1

## 2023-08-11 MED ORDER — VITAMIN B-12 100 MCG PO TABS
100.0000 ug | ORAL_TABLET | Freq: Every day | ORAL | Status: DC
  Administered 2023-08-11 – 2023-08-12 (×2): 100 ug via ORAL
  Filled 2023-08-11 (×2): qty 1

## 2023-08-11 MED ORDER — FUROSEMIDE 10 MG/ML IJ SOLN
40.0000 mg | Freq: Two times a day (BID) | INTRAMUSCULAR | Status: DC
  Administered 2023-08-11: 40 mg via INTRAVENOUS
  Filled 2023-08-11: qty 4

## 2023-08-11 MED ORDER — SODIUM CHLORIDE 0.9 % IV SOLN: 250.0000 mL | INTRAVENOUS | Status: DC | PRN

## 2023-08-11 MED ORDER — LINACLOTIDE 145 MCG PO CAPS: 145.0000 ug | ORAL_CAPSULE | Freq: Every day | ORAL | Status: DC | PRN

## 2023-08-11 MED ORDER — FOLIC ACID 1 MG PO TABS
1.0000 mg | ORAL_TABLET | Freq: Every day | ORAL | Status: DC
  Administered 2023-08-11 – 2023-08-12 (×2): 1 mg via ORAL
  Filled 2023-08-11 (×2): qty 1

## 2023-08-11 MED ORDER — APIXABAN 2.5 MG PO TABS
2.5000 mg | ORAL_TABLET | Freq: Two times a day (BID) | ORAL | Status: DC
  Administered 2023-08-11 – 2023-08-12 (×4): 2.5 mg via ORAL
  Filled 2023-08-11 (×4): qty 1

## 2023-08-11 MED ORDER — DICYCLOMINE HCL 20 MG PO TABS
20.0000 mg | ORAL_TABLET | Freq: Two times a day (BID) | ORAL | Status: DC
  Administered 2023-08-12: 20 mg via ORAL
  Filled 2023-08-11 (×4): qty 1

## 2023-08-11 MED ORDER — ACETAMINOPHEN 650 MG RE SUPP: 650.0000 mg | Freq: Four times a day (QID) | RECTAL | Status: DC | PRN

## 2023-08-11 MED ORDER — NITROGLYCERIN 0.4 MG SL SUBL: 0.4000 mg | SUBLINGUAL_TABLET | SUBLINGUAL | Status: DC | PRN

## 2023-08-11 MED ORDER — FUROSEMIDE 40 MG PO TABS
40.0000 mg | ORAL_TABLET | Freq: Every day | ORAL | Status: DC
  Administered 2023-08-11 – 2023-08-12 (×2): 40 mg via ORAL
  Filled 2023-08-11 (×2): qty 1

## 2023-08-11 MED ORDER — FUROSEMIDE 10 MG/ML IJ SOLN: 40.0000 mg | Freq: Two times a day (BID) | INTRAMUSCULAR | Status: DC

## 2023-08-11 MED ORDER — ACETAMINOPHEN 325 MG PO TABS
650.0000 mg | ORAL_TABLET | Freq: Four times a day (QID) | ORAL | Status: DC | PRN
  Administered 2023-08-11: 650 mg via ORAL
  Filled 2023-08-11: qty 2

## 2023-08-11 MED ORDER — SENNOSIDES-DOCUSATE SODIUM 8.6-50 MG PO TABS: 1.0000 | ORAL_TABLET | Freq: Every evening | ORAL | Status: DC | PRN

## 2023-08-11 MED ORDER — INSULIN ASPART 100 UNIT/ML IJ SOLN
0.0000 [IU] | Freq: Three times a day (TID) | INTRAMUSCULAR | Status: DC
  Administered 2023-08-11: 2 [IU] via SUBCUTANEOUS
  Administered 2023-08-12: 1 [IU] via SUBCUTANEOUS

## 2023-08-11 MED ORDER — SODIUM CHLORIDE 0.9% FLUSH
3.0000 mL | INTRAVENOUS | Status: DC | PRN
  Administered 2023-08-11: 3 mL via INTRAVENOUS

## 2023-08-11 MED ORDER — PANTOPRAZOLE SODIUM 40 MG PO TBEC: 40.0000 mg | DELAYED_RELEASE_TABLET | Freq: Two times a day (BID) | ORAL | Status: DC

## 2023-08-11 MED ORDER — INSULIN ASPART 100 UNIT/ML IJ SOLN: 0.0000 [IU] | Freq: Every day | INTRAMUSCULAR | Status: DC

## 2023-08-11 MED ORDER — MIRABEGRON ER 50 MG PO TB24
50.0000 mg | ORAL_TABLET | Freq: Every day | ORAL | Status: DC
  Administered 2023-08-11 – 2023-08-12 (×2): 50 mg via ORAL
  Filled 2023-08-11 (×2): qty 1

## 2023-08-11 MED ORDER — ONDANSETRON HCL 4 MG/2ML IJ SOLN: 4.0000 mg | Freq: Four times a day (QID) | INTRAMUSCULAR | Status: DC | PRN

## 2023-08-11 MED ORDER — PINDOLOL 5 MG PO TABS
2.5000 mg | ORAL_TABLET | Freq: Two times a day (BID) | ORAL | Status: DC
  Administered 2023-08-11 – 2023-08-12 (×3): 2.5 mg via ORAL
  Filled 2023-08-11 (×4): qty 1

## 2023-08-11 MED ORDER — SERTRALINE HCL 25 MG PO TABS
25.0000 mg | ORAL_TABLET | Freq: Every day | ORAL | Status: DC
  Administered 2023-08-11: 25 mg via ORAL
  Filled 2023-08-11: qty 1

## 2023-08-11 MED ORDER — ONDANSETRON HCL 4 MG PO TABS: 4.0000 mg | ORAL_TABLET | Freq: Four times a day (QID) | ORAL | Status: DC | PRN

## 2023-08-11 MED ORDER — ROSUVASTATIN CALCIUM 5 MG PO TABS
10.0000 mg | ORAL_TABLET | Freq: Every day | ORAL | Status: DC
  Administered 2023-08-11: 10 mg via ORAL
  Filled 2023-08-11: qty 2

## 2023-08-11 MED ORDER — OXYCODONE-ACETAMINOPHEN 5-325 MG PO TABS
1.0000 | ORAL_TABLET | Freq: Four times a day (QID) | ORAL | Status: DC | PRN
  Administered 2023-08-11 (×2): 1 via ORAL
  Administered 2023-08-11: 2 via ORAL
  Administered 2023-08-12: 1 via ORAL
  Filled 2023-08-11 (×3): qty 1
  Filled 2023-08-11: qty 2

## 2023-08-11 MED ORDER — SODIUM CHLORIDE 0.9% FLUSH
3.0000 mL | Freq: Two times a day (BID) | INTRAVENOUS | Status: DC
  Administered 2023-08-11 (×2): 3 mL via INTRAVENOUS

## 2023-08-11 MED ORDER — PIPERACILLIN-TAZOBACTAM 3.375 G IVPB
3.3750 g | Freq: Three times a day (TID) | INTRAVENOUS | Status: DC
  Administered 2023-08-11 – 2023-08-12 (×4): 3.375 g via INTRAVENOUS
  Filled 2023-08-11 (×4): qty 50

## 2023-08-11 MED ORDER — PANTOPRAZOLE SODIUM 40 MG PO TBEC
40.0000 mg | DELAYED_RELEASE_TABLET | Freq: Every day | ORAL | Status: DC
  Administered 2023-08-11 – 2023-08-12 (×2): 40 mg via ORAL
  Filled 2023-08-11 (×2): qty 1

## 2023-08-11 MED ORDER — MAGNESIUM SULFATE 4 GM/100ML IV SOLN
4.0000 g | Freq: Once | INTRAVENOUS | Status: AC
  Administered 2023-08-11: 4 g via INTRAVENOUS
  Filled 2023-08-11: qty 100

## 2023-08-11 NOTE — Progress Notes (Signed)
PROGRESS NOTE        PATIENT DETAILS Name: Heather Olsen Age: 89 y.o. Sex: female Date of Birth: 09/10/1931 Admit Date: 08/10/2023 Admitting Physician Tereasa Coop, MD [id number], Zollie Beckers, MD  Brief Summary: Patient is a 89 y.o.  female with history of chronic HFpEF, PAF, HTN, DM-2 presented with abdominal pain-found to have diverticulitis.  Significant events: 9/12>> admit to [facility]  Significant studies: 9/12>> CT abdomen/pelvis: Diverticulitis at the rectosigmoid junction.  Significant microbiology data: 9/12>> COVID PCR: Negative  Procedures: None  Consults: None  Subjective: Mild LLQ pain.  Lying flat in bed.  No shortness of breath.  Objective: Vitals: Blood pressure (!) 107/58, pulse 63, temperature 97.9 F (36.6 C), temperature source Oral, resp. rate (!) 21, height 5\' 2"  (1.575 m), weight 49.6 kg, SpO2 90%.   Exam: Gen Exam:Alert awake-not in any distress HEENT:atraumatic, normocephalic Chest: B/L clear to auscultation anteriorly CVS:S1S2 regular Abdomen: Soft-minimal LLQ tenderness-no peritoneal signs. Extremities:no edema Neurology: Non focal Skin: no rash  Pertinent Labs/Radiology:    Latest Ref Rng & Units 08/11/2023    7:09 AM 08/10/2023    5:20 PM 08/09/2023    4:45 AM  CBC  WBC 4.0 - 10.5 K/uL 9.2  13.8  8.8   Hemoglobin 12.0 - 15.0 g/dL 9.1  86.5  78.4   Hematocrit 36.0 - 46.0 % 28.4  31.8  30.0   Platelets 150 - 400 K/uL 246  275  281     Lab Results  Component Value Date   NA 133 (L) 08/11/2023   K 4.0 08/11/2023   CL 94 (L) 08/11/2023   CO2 29 08/11/2023      Assessment/Plan: Acute sigmoid diverticulitis Clinically improved-no fever-leukocytosis has resolved-minimal LLQ pain on palpation Continue Zosyn for today-follow clinical course-suspect if improved-likely can go home tomorrow  Chronic HFpEF No signs of volume overload-completely euvolemic on exam Stop IV Lasix Starting oral Lasix O2 saturations in  the high 90s on room air this morning.  Note-acute on chronic HFpEF/acute hypoxic respiratory failure ruled out  Minimally elevated troponin No chest pain Recent echo stable Likely demand ischemia  Moderate pulmonary hypertension Stable for continued follow-up outpatient  CKD stage II Creatinine close to baseline AKI ruled out  PAF Pindolol/Eliquis Telemetry monitoring  HLD Statin  DM-2 (A1c 7.5 on 8/29) Hold metformin CBGs stable and SSI  Recent Labs    08/11/23 0728  GLUCAP 212*     GERD PPI  Chronic back pain As needed narcotics At baseline  Overactive bladder Continue mirabegron  Anxiety disorder Stable Zoloft  History of vertigo Intermittent/chronic issue PT/OT eval while inpatient  BMI: Estimated body mass index is 20 kg/m as calculated from the following:   Height as of this encounter: 5\' 2"  (1.575 m).   Weight as of this encounter: 49.6 kg.   Code status:   Code Status: Full Code   DVT Prophylaxis: apixaban (ELIQUIS) tablet 2.5 mg Start: 08/11/23 0230 SCDs Start: 08/11/23 0210 apixaban (ELIQUIS) tablet 2.5 mg    Family Communication: None at bedside   Disposition Plan: Status is: Inpatient Remains inpatient appropriate because: Severity of illness   Planned Discharge Destination:Home   Diet: Diet Order             Diet heart healthy/carb modified Room service appropriate? Yes; Fluid consistency: Thin; Fluid restriction: 2000 mL Fluid  Diet effective now  Antimicrobial agents: Anti-infectives (From admission, onward)    Start     Dose/Rate Route Frequency Ordered Stop   08/11/23 0315  piperacillin-tazobactam (ZOSYN) IVPB 3.375 g        3.375 g 12.5 mL/hr over 240 Minutes Intravenous Every 8 hours 08/11/23 0225     08/10/23 2130  Ampicillin-Sulbactam (UNASYN) 3 g in sodium chloride 0.9 % 100 mL IVPB        3 g 200 mL/hr over 30 Minutes Intravenous  Once 08/10/23 2121 08/10/23 2244   08/10/23  0000  amoxicillin-clavulanate (AUGMENTIN) 875-125 MG tablet        1 tablet Oral Every 12 hours 08/10/23 2122 08/20/23 2359        MEDICATIONS: Scheduled Meds:  apixaban  2.5 mg Oral BID   dicyclomine  20 mg Oral BID   folic acid  1 mg Oral Daily   insulin aspart  0-5 Units Subcutaneous QHS   insulin aspart  0-6 Units Subcutaneous TID WC   mirabegron ER  50 mg Oral Daily   pantoprazole  40 mg Oral Daily   pindolol  2.5 mg Oral BID   rOPINIRole  0.5 mg Oral QHS   rosuvastatin  10 mg Oral QHS   sertraline  25 mg Oral QHS   sodium chloride flush  3 mL Intravenous Q12H   vitamin B-12  100 mcg Oral Daily   Continuous Infusions:  sodium chloride     piperacillin-tazobactam (ZOSYN)  IV 3.375 g (08/11/23 0253)   PRN Meds:.sodium chloride, acetaminophen **OR** acetaminophen, linaclotide, nitroGLYCERIN, ondansetron **OR** ondansetron (ZOFRAN) IV, oxyCODONE-acetaminophen, senna-docusate, sodium chloride flush   I have personally reviewed following labs and imaging studies  LABORATORY DATA: CBC: Recent Labs  Lab 08/09/23 0445 08/10/23 1720 08/11/23 0709  WBC 8.8 13.8* 9.2  NEUTROABS 6.3  --   --   HGB 10.2* 10.5* 9.1*  HCT 30.0* 31.8* 28.4*  MCV 104.5* 107.1* 108.0*  PLT 281 275 246    Basic Metabolic Panel: Recent Labs  Lab 08/08/23 1601 08/09/23 0445 08/10/23 1720 08/11/23 0709 08/11/23 0711  NA 135 133* 133* 133*  --   K 4.6 3.4* 4.5 4.0  --   CL 93* 96* 93* 94*  --   CO2 27 28 30 29   --   GLUCOSE 124* 137* 191* 229*  --   BUN 27 29* 30* 24*  --   CREATININE 1.11* 0.92 1.06* 1.01*  --   CALCIUM 10.2 8.9 9.2 9.0  --   MG  --   --   --   --  1.6*    GFR: Estimated Creatinine Clearance: 29 mL/min (A) (by C-G formula based on SCr of 1.01 mg/dL (H)).  Liver Function Tests: Recent Labs  Lab 08/10/23 1720 08/11/23 0709  AST 22 27  ALT 12 17  ALKPHOS 90 94  BILITOT 0.5 0.7  PROT 6.8 5.9*  ALBUMIN 4.0 3.0*   Recent Labs  Lab 08/10/23 1720  LIPASE 19    No results for input(s): "AMMONIA" in the last 168 hours.  Coagulation Profile: No results for input(s): "INR", "PROTIME" in the last 168 hours.  Cardiac Enzymes: No results for input(s): "CKTOTAL", "CKMB", "CKMBINDEX", "TROPONINI" in the last 168 hours.  BNP (last 3 results) No results for input(s): "PROBNP" in the last 8760 hours.  Lipid Profile: No results for input(s): "CHOL", "HDL", "LDLCALC", "TRIG", "CHOLHDL", "LDLDIRECT" in the last 72 hours.  Thyroid Function Tests: No results for input(s): "TSH", "T4TOTAL", "FREET4", "T3FREE", "THYROIDAB" in  the last 72 hours.  Anemia Panel: Recent Labs    08/11/23 0711  RETICCTPCT 1.8    Urine analysis:    Component Value Date/Time   COLORURINE YELLOW 08/10/2023 1720   APPEARANCEUR CLEAR 08/10/2023 1720   LABSPEC 1.017 08/10/2023 1720   PHURINE 5.5 08/10/2023 1720   GLUCOSEU 250 (A) 08/10/2023 1720   HGBUR NEGATIVE 08/10/2023 1720   BILIRUBINUR NEGATIVE 08/10/2023 1720   KETONESUR NEGATIVE 08/10/2023 1720   PROTEINUR NEGATIVE 08/10/2023 1720   NITRITE NEGATIVE 08/10/2023 1720   LEUKOCYTESUR SMALL (A) 08/10/2023 1720    Sepsis Labs: Lactic Acid, Venous    Component Value Date/Time   LATICACIDVEN 1.3 08/25/2022 0033    MICROBIOLOGY: Recent Results (from the past 240 hour(s))  SARS Coronavirus 2 by RT PCR (hospital order, performed in [facility] hospital lab) *cepheid single result test* Anterior Nasal Swab     Status: None   Collection Time: 08/10/23  5:20 PM   Specimen: Anterior Nasal Swab  Result Value Ref Range Status   SARS Coronavirus 2 by RT PCR NEGATIVE NEGATIVE Final    Comment: (NOTE) SARS-CoV-2 target nucleic acids are NOT DETECTED.  The SARS-CoV-2 RNA is generally detectable in upper and lower respiratory specimens during the acute phase of infection. The lowest concentration of SARS-CoV-2 viral copies this assay can detect is 250 copies / mL. A negative result does not preclude SARS-CoV-2  infection and should not be used as the sole basis for treatment or other patient management decisions.  A negative result may occur with improper specimen collection / handling, submission of specimen other than nasopharyngeal swab, presence of viral mutation(s) within the areas targeted by this assay, and inadequate number of viral copies (<250 copies / mL). A negative result must be combined with clinical observations, patient history, and epidemiological information.  Fact Sheet for Patients:   RoadLapTop.co.za  Fact Sheet for Healthcare Providers: http://kim-miller.com/  This test is not yet approved or  cleared by the [place] FDA and has been authorized for detection and/or diagnosis of SARS-CoV-2 by FDA under an Emergency Use Authorization (EUA).  This EUA will remain in effect (meaning this test can be used) for the duration of the COVID-19 declaration under Section 564(b)(1) of the Act, 21 U.S.C. section 360bbb-3(b)(1), unless the authorization is terminated or revoked sooner.  Performed at [facility], [address], [place], [place] [address]     RADIOLOGY STUDIES/RESULTS: DG Chest Portable 1 View  Result Date: 08/10/2023 CLINICAL DATA:  Hypoxia EXAM: PORTABLE CHEST 1 VIEW COMPARISON:  07/26/2023 FINDINGS: Marked scoliosis. Cardiomegaly. Mild diffuse bronchitic changes. Atelectasis left base. Aortic atherosclerosis. No consolidation or pleural effusion. No pneumothorax. IMPRESSION: 1. Cardiomegaly. 2. Mild bronchitic changes. Electronically Signed   By: Jasmine Pang M.D.   On: 08/10/2023 23:40   CT ABDOMEN PELVIS W CONTRAST  Result Date: 08/10/2023 CLINICAL DATA:  Left lower quadrant abdominal pain and diarrhea. History of diverticulitis and IBS. EXAM: CT ABDOMEN AND PELVIS WITH CONTRAST TECHNIQUE: Multidetector CT imaging of the abdomen and pelvis was performed using the standard protocol following  bolus administration of intravenous contrast. RADIATION DOSE REDUCTION: This exam was performed according to the departmental dose-optimization program which includes automated exposure control, adjustment of the mA and/or kV according to patient size and/or use of iterative reconstruction technique. CONTRAST:  75mL OMNIPAQUE IOHEXOL 300 MG/ML  SOLN COMPARISON:  08/09/2023, 07/18/2023. FINDINGS: Lower chest: The heart is enlarged. Mild atelectasis or scarring is noted at the lung bases. Hepatobiliary: There  is a vague hypodensity in the anterior right lobe of the liver measuring 1.6 x 1.1 cm, previously characterized as hemangioma. No biliary ductal dilatation. The gallbladder is without stones. Pancreas: Pancreatic atrophy is noted. No pancreatic ductal dilatation or surrounding inflammatory changes. Spleen: Normal in size without focal abnormality. Adrenals/Urinary Tract: The adrenal glands are within normal limits. The kidneys enhance symmetrically. Subcentimeter hypodensities are noted in the kidneys bilaterally which are too small to further characterize. No renal calculus or hydronephrosis. The bladder is unremarkable. Stomach/Bowel: Stomach is within normal limits. Appendix is not seen. No bowel obstruction, free air or pneumatosis. Scattered diverticula are present along the colon. There is bowel wall thickening with surrounding inflammatory changes involving the sigmoid colon and rectum. Vascular/Lymphatic: Aortic atherosclerosis. A vascular stent is noted in the SMA. No abdominal or pelvic lymphadenopathy. Reproductive: Uterus and bilateral adnexa are unremarkable. Other: No abdominopelvic ascites. Musculoskeletal: There scoliosis of the thoracolumbar spine with degenerative changes. No acute osseous abnormality. IMPRESSION: 1. Colonic diverticulosis. There is bowel wall thickening with surrounding inflammatory changes at the rectosigmoid colon, possible diverticulitis versus superimposed infectious or  inflammatory colitis. 2. Aortic atherosclerosis. Electronically Signed   By: Thornell Sartorius M.D.   On: 08/10/2023 20:29     LOS: 0 days   Jeoffrey Massed, MD  [facility]    To contact the attending provider between 7A-7P or the covering provider during after hours 7P-7A, please log into the web site [email or web address] and access using universal [place] password for that web site. If you do not have the password, please call the hospital operator.  08/11/2023, 8:29 AM

## 2023-08-11 NOTE — Plan of Care (Signed)
Problem: Education: Goal: Knowledge of General Education information will improve Description: Including pain rating scale, medication(s)/side effects and non-pharmacologic comfort measures Outcome: Progressing   Problem: Health Behavior/Discharge Planning: Goal: Ability to manage health-related needs will improve Outcome: Progressing   Problem: Clinical Measurements: Goal: Will remain free from infection Outcome: Progressing Goal: Respiratory complications will improve Outcome: Progressing   Problem: Pain Managment: Goal: General experience of comfort will improve Outcome: Progressing

## 2023-08-11 NOTE — H&P (Addendum)
History and Physical    Heather Olsen VOH:[id number] DOB: 04/11/1931 DOA: 08/10/2023  PCP: Merri Brunette, MD   Patient coming from: Home   Chief Complaint:  Chief Complaint  Patient presents with   Abdominal Pain    HPI:  Heather Olsen is a 89 y.o. female with medical history significant of chronic diastolic heart failure with preserved EF, paroxysmal atrial fibrillation on Eliquis, diabetes type 2, essential hypertension, non-insulin-dependent DM type II, hyperlipidemia, restless leg syndrome, generalized anxiety disorder, chronic vertigo, overactive bladder, sigmoid diverticulosis, GERD, CKD stage II and chronic dyspnea present to emergency department for evaluation for abdominal pain.  Patient reported it feels similar episode of previous episode of diverticulitis.  Patient also reported fever at home temperature max of 100.2 F.  Denies any nausea vomiting.  Patient has generalized weakness and fatigue.  Patient denies any chest pain and palpitation.  Of note, patient has been admitted in the hospital 2 weeks ago for acute CHF exacerbation and she was managed with IV diuretics and discharged home with oral Lasix 40 mg daily. Per chart review patient has been seen and evaluated by outpatient cardiology on 08/08/2023 by Dr. Rollene Rotunda recommended continue Lasix and no change of medication.   ED Course:  At presentation to ED patient temperature 99.3, respiratory rate 20, heart rate 77, blood pressure 127/70 history and O2 sat found 93% room air however O2 sat improved to 100% on 2 L oxygen. Found to have elevated BNP 842. Elevated troponin 137. EKG showed normal sinus rhythm heart rate 72, first-degree AV block and atrial premature complex.  There is no ST and T wave abnormality.  COVID-negative. CBC showed elevated WBC count 13.8, RBC 2.97, hemoglobin 10.5, hematocrit 31, and platelet count 275. CMP showed sodium 133, potassium 4.5, chloride 93, blood glucose 191, BUN 30, elevated  creatinine 1.06, otherwise unremarkable. Normal lipase 13.  Chest x-ray showed cardiomegaly and chronic bronchitis change.  CT abdomen pelvis showed chronic diverticulosis, There is bowel wall thickening with surrounding inflammatory changes at the rectosigmoid colon, possible diverticulitis versus superimposed infectious or inflammatory colitis.  In the ED patient patient received 1 dose of Unasyn.  [facility] has been [facility] for further evaluation management of dyspnea and diverticulitis.   Review of Systems:  Review of Systems  Constitutional:  Positive for fever. Negative for chills, diaphoresis, malaise/fatigue and weight loss.  Respiratory:  Positive for shortness of breath. Negative for cough and sputum production.   Cardiovascular:  Negative for chest pain, palpitations, orthopnea and leg swelling.  Gastrointestinal:  Negative for abdominal pain, heartburn, nausea and vomiting.  Musculoskeletal:  Negative for myalgias.  Neurological:  Negative for dizziness, tremors, weakness and headaches.  Psychiatric/Behavioral:  The patient is not nervous/anxious.     Past Medical History:  Diagnosis Date   Aortic stenosis 04/12/2016   none noted on echo done 08/13/20   Coronary artery disease    Depression    Diverticulosis    Hearing loss 04/12/2016   Hiatal hernia 04/12/2016   HLD (hyperlipidemia) 04/12/2016   Hyperglycemia 09/14/2016   Hypertension    Idiopathic scoliosis 04/12/2016   Major depression 04/12/2016   Mitral regurgitation    Osteoporosis, senile 04/12/2016   Pernicious anemia    Raynaud's disease 04/12/2016   Scoliosis    Spinal stenosis of lumbar region 04/12/2016    Past Surgical History:  Procedure Laterality Date   APPENDECTOMY     BREAST SURGERY     broken wrist  Right 2007   CARPAL TUNNEL RELEASE Left 04/04/2017   Procedure: LEFT CARPAL TUNNEL RELEASE;  Surgeon: Cindee Salt, MD;  Location: [facility];   Service: Orthopedics;  Laterality: Left;  REG/FAB   CARPAL TUNNEL RELEASE Right 12/01/2020   Procedure: CARPAL TUNNEL RELEASE;  Surgeon: Cindee Salt, MD;  Location: [facility];  Service: Orthopedics;  Laterality: Right;  IV REGIONAL FOREARM BLOCK   CATARACT EXTRACTION W/ INTRAOCULAR LENS  IMPLANT, BILATERAL Bilateral 2017   COLONOSCOPY  2016   FOOT SURGERY Right    PERIPHERAL VASCULAR INTERVENTION  11/11/2019   Procedure: PERIPHERAL VASCULAR INTERVENTION;  Surgeon: Maeola Harman, MD;  Location: [facility] INVASIVE CV LAB;  Service: Cardiovascular;;   TONSILLECTOMY     VISCERAL ANGIOGRAPHY N/A 11/11/2019   Procedure: VISCERAL ANGIOGRAPHY;  Surgeon: Maeola Harman, MD;  Location: [facility] INVASIVE CV LAB;  Service: Cardiovascular;  Laterality: N/A;     reports that she has never smoked. She has never been exposed to tobacco smoke. She has never used smokeless tobacco. She reports current alcohol use. She reports that she does not use drugs.  Allergies  Allergen Reactions   Sulfasalazine Other (See Comments)    Other reaction(s): Other (See Comments), headaches, headaches, headaches   Topiramate Rash   Ace Inhibitors Cough   Codeine Nausea Only   Ibandronic Acid Nausea Only   Meloxicam     Other reaction(s): GI upset, GI Upset (intolerance), Other (See Comments) unknown    Prevacid [Lansoprazole]     Chest pain   Sulfa Antibiotics Other (See Comments)    headaches   Verapamil Other (See Comments)    constipation   Zolpidem Other (See Comments)    hallucinations    Family History  Problem Relation Age of Onset   Heart disease Mother        Died 35, MR, no CAD   Heart disease Father        CHF, no CAD.[name]    HIV Daughter 57       from bone tissue transplant    Prior to Admission medications   Medication Sig Start Date End Date Taking? Authorizing Provider  amoxicillin-clavulanate (AUGMENTIN) 875-125 MG tablet Take 1 tablet by mouth every 12 (twelve)  hours for 10 days. 08/10/23 08/20/23 Yes Lonell Grandchild, MD  acetaminophen (TYLENOL) 500 MG tablet Take 500-1,000 mg by mouth every 6 (six) hours as needed for moderate pain.    [provider]  Cholecalciferol (VITAMIN D3) 2000 units capsule Take 2,000 Units by mouth daily.     [provider]  denosumab (PROLIA) 60 MG/ML SOSY injection Inject 60 mg into the skin every 6 (six) months. 07/25/19   [provider]  diclofenac Sodium (VOLTAREN) 1 % GEL Apply topically 4 (four) times daily.    [provider]  dicyclomine (BENTYL) 20 MG tablet Take 1 tablet (20 mg total) by mouth 2 (two) times daily. 05/03/23   Fondaw, Wylder S, PA  ELIQUIS 2.5 MG TABS tablet Take 2.5 mg by mouth 2 (two) times daily. 02/03/20   [provider]  furosemide (LASIX) 10 MG/ML solution Take 0.5 mLs (5 mg total) by mouth daily as needed for fluid or edema (take 5 mg for a weight gain of 2 pounds or more overnight). 08/08/23   Rollene Rotunda, MD  ipratropium (ATROVENT) 0.06 % nasal spray Use 2 sprays in each nostril up to three times daily before meals for sinus drainage. Patient taking differently: Place 1  spray into the nose daily. 09/30/16   Kimber Relic, MD  LINZESS 145 MCG CAPS capsule Take 145 mcg by mouth daily as needed (constipation). 07/29/22   [provider]  metFORMIN (GLUCOPHAGE-XR) 500 MG 24 hr tablet Take 1,000 mg by mouth every evening. 02/01/22   [provider]  mirabegron ER (MYRBETRIQ) 50 MG TB24 tablet Take 50 mg by mouth daily. 08/11/21   [provider]  MULTIPLE VITAMINS-MINERALS PO Take 1 tablet by mouth daily.     [provider]  nitroGLYCERIN (NITROSTAT) 0.4 MG SL tablet Place 1 tablet (0.4 mg total) under the tongue every 5 (five) minutes as needed for chest pain. Place one tablet under the tongue every 5 minutes as needed for chest pain. No more than 3 08/09/22   Rollene Rotunda, MD  ondansetron (ZOFRAN) 4 MG tablet Take 1  tablet (4 mg total) by mouth every 6 (six) hours as needed for nausea or vomiting. 07/28/23   Rai, Ripudeep K, MD  pantoprazole (PROTONIX) 40 MG tablet Take 40 mg by mouth 2 (two) times daily. 12/18/19   [provider]  pindolol (VISKEN) 5 MG tablet Take 1/2 (one-half) tablet by mouth twice daily Patient taking differently: Take 2.5 mg by mouth 2 (two) times daily. 07/04/23   Rollene Rotunda, MD  Probiotic Product (PROBIOTIC DAILY PO) Take 1 capsule by mouth every evening.    [provider]  rOPINIRole (REQUIP) 0.5 MG tablet Take 0.5 mg by mouth at bedtime. 07/13/22   [provider]  rosuvastatin (CRESTOR) 10 MG tablet TAKE 1 TABLET BY MOUTH AT BEDTIME Patient taking differently: Take 10 mg by mouth daily. 11/23/20   Maeola Harman, MD  sertraline (ZOLOFT) 25 MG tablet Take 25 mg by mouth at bedtime. 08/10/22   [provider]  traMADol (ULTRAM) 50 MG tablet TAKE 1 TABLET BY MOUTH EVERY 4 HOURS Patient taking differently: Take 50 mg by mouth every 12 (twelve) hours as needed for moderate pain or severe pain (pain). 11/14/16   Kermit Balo, DO     Physical Exam: Vitals:   08/10/23 2145 08/10/23 2200 08/10/23 2230 08/10/23 2245  BP:  120/62  126/64  Pulse: 74 81 73 74  Resp: (!) 25 18 18  (!) 24  Temp:      TempSrc:      SpO2: (!) 84% 98% 100% 100%  Weight:      Height:        Physical Exam Constitutional:      General: She is not in acute distress.    Appearance: She is not ill-appearing.  HENT:     Mouth/Throat:     Mouth: Mucous membranes are moist.  Cardiovascular:     Rate and Rhythm: Regular rhythm. Bradycardia present.     Heart sounds: Normal heart sounds.  Pulmonary:     Effort: Pulmonary effort is normal.     Breath sounds: Rales present. No wheezing or rhonchi.  Abdominal:     General: Abdomen is flat. Bowel sounds are normal. There is no distension.     Palpations: Abdomen is soft.     Tenderness: There is no abdominal  tenderness. There is no guarding or rebound.  Skin:    General: Skin is warm.     Capillary Refill: Capillary refill takes less than 2 seconds.  Neurological:     General: No focal deficit present.     Mental Status: She is alert.  Psychiatric:  Mood and Affect: Mood normal.      Labs on Admission: I have personally reviewed following labs and imaging studies  CBC: Recent Labs  Lab 08/09/23 0445 08/10/23 1720  WBC 8.8 13.8*  NEUTROABS 6.3  --   HGB 10.2* 10.5*  HCT 30.0* 31.8*  MCV 104.5* 107.1*  PLT 281 275   Basic Metabolic Panel: Recent Labs  Lab 08/08/23 1601 08/09/23 0445 08/10/23 1720  NA 135 133* 133*  K 4.6 3.4* 4.5  CL 93* 96* 93*  CO2 27 28 30   GLUCOSE 124* 137* 191*  BUN 27 29* 30*  CREATININE 1.11* 0.92 1.06*  CALCIUM 10.2 8.9 9.2   GFR: Estimated Creatinine Clearance: 27.5 mL/min (A) (by C-G formula based on SCr of 1.06 mg/dL (H)). Liver Function Tests: Recent Labs  Lab 08/10/23 1720  AST 22  ALT 12  ALKPHOS 90  BILITOT 0.5  PROT 6.8  ALBUMIN 4.0   Recent Labs  Lab 08/10/23 1720  LIPASE 19   No results for input(s): "AMMONIA" in the last 168 hours. Coagulation Profile: No results for input(s): "INR", "PROTIME" in the last 168 hours. Cardiac Enzymes: Recent Labs  Lab 08/10/23 2251  TROPONINIHS 137*   BNP (last 3 results) Recent Labs    08/30/22 1351 07/26/23 1450 08/10/23 1720  BNP 1,103.7* 328.1* 842.0*   HbA1C: No results for input(s): "HGBA1C" in the last 72 hours. CBG: No results for input(s): "GLUCAP" in the last 168 hours. Lipid Profile: No results for input(s): "CHOL", "HDL", "LDLCALC", "TRIG", "CHOLHDL", "LDLDIRECT" in the last 72 hours. Thyroid Function Tests: No results for input(s): "TSH", "T4TOTAL", "FREET4", "T3FREE", "THYROIDAB" in the last 72 hours. Anemia Panel: No results for input(s): "VITAMINB12", "FOLATE", "FERRITIN", "TIBC", "IRON", "RETICCTPCT" in the last 72 hours. Urine analysis:     Component Value Date/Time   COLORURINE YELLOW 08/10/2023 1720   APPEARANCEUR CLEAR 08/10/2023 1720   LABSPEC 1.017 08/10/2023 1720   PHURINE 5.5 08/10/2023 1720   GLUCOSEU 250 (A) 08/10/2023 1720   HGBUR NEGATIVE 08/10/2023 1720   BILIRUBINUR NEGATIVE 08/10/2023 1720   KETONESUR NEGATIVE 08/10/2023 1720   PROTEINUR NEGATIVE 08/10/2023 1720   NITRITE NEGATIVE 08/10/2023 1720   LEUKOCYTESUR SMALL (A) 08/10/2023 1720    Radiological Exams on Admission: I have personally reviewed images DG Chest Portable 1 View  Result Date: 08/10/2023 CLINICAL DATA:  Hypoxia EXAM: PORTABLE CHEST 1 VIEW COMPARISON:  07/26/2023 FINDINGS: Marked scoliosis. Cardiomegaly. Mild diffuse bronchitic changes. Atelectasis left base. Aortic atherosclerosis. No consolidation or pleural effusion. No pneumothorax. IMPRESSION: 1. Cardiomegaly. 2. Mild bronchitic changes. Electronically Signed   By: Jasmine Pang M.D.   On: 08/10/2023 23:40   CT ABDOMEN PELVIS W CONTRAST  Result Date: 08/10/2023 CLINICAL DATA:  Left lower quadrant abdominal pain and diarrhea. History of diverticulitis and IBS. EXAM: CT ABDOMEN AND PELVIS WITH CONTRAST TECHNIQUE: Multidetector CT imaging of the abdomen and pelvis was performed using the standard protocol following bolus administration of intravenous contrast. RADIATION DOSE REDUCTION: This exam was performed according to the departmental dose-optimization program which includes automated exposure control, adjustment of the mA and/or kV according to patient size and/or use of iterative reconstruction technique. CONTRAST:  75mL OMNIPAQUE IOHEXOL 300 MG/ML  SOLN COMPARISON:  08/09/2023, 07/18/2023. FINDINGS: Lower chest: The heart is enlarged. Mild atelectasis or scarring is noted at the lung bases. Hepatobiliary: There is a vague hypodensity in the anterior right lobe of the liver measuring 1.6 x 1.1 cm, previously characterized as hemangioma. No biliary ductal dilatation. The  gallbladder is without  stones. Pancreas: Pancreatic atrophy is noted. No pancreatic ductal dilatation or surrounding inflammatory changes. Spleen: Normal in size without focal abnormality. Adrenals/Urinary Tract: The adrenal glands are within normal limits. The kidneys enhance symmetrically. Subcentimeter hypodensities are noted in the kidneys bilaterally which are too small to further characterize. No renal calculus or hydronephrosis. The bladder is unremarkable. Stomach/Bowel: Stomach is within normal limits. Appendix is not seen. No bowel obstruction, free air or pneumatosis. Scattered diverticula are present along the colon. There is bowel wall thickening with surrounding inflammatory changes involving the sigmoid colon and rectum. Vascular/Lymphatic: Aortic atherosclerosis. A vascular stent is noted in the SMA. No abdominal or pelvic lymphadenopathy. Reproductive: Uterus and bilateral adnexa are unremarkable. Other: No abdominopelvic ascites. Musculoskeletal: There scoliosis of the thoracolumbar spine with degenerative changes. No acute osseous abnormality. IMPRESSION: 1. Colonic diverticulosis. There is bowel wall thickening with surrounding inflammatory changes at the rectosigmoid colon, possible diverticulitis versus superimposed infectious or inflammatory colitis. 2. Aortic atherosclerosis. Electronically Signed   By: Thornell Sartorius M.D.   On: 08/10/2023 20:29    EKG: My personal interpretation of EKG shows: EKG showed normal sinus rhythm heart rate 72, first-degree AV block and atrial premature complex.  There is no ST and T wave abnormality.    Assessment/Plan: Principal Problem:   Acute exacerbation of CHF (congestive heart failure) (HCC) Active Problems:   Diverticulitis   Chronic diastolic CHF (congestive heart failure) (HCC)   AKI (acute kidney injury) on CKD stage II (HCC)   First degree AV block   Acute hypoxic respiratory failure (HCC)   Essential hypertension   Hyperlipidemia   Paroxysmal atrial  fibrillation (HCC)   Non-insulin dependent type 2 diabetes mellitus (HCC)   GERD (gastroesophageal reflux disease)   Restless leg syndrome   GAD (generalized anxiety disorder)   Hypoxia   CKD (chronic kidney disease) stage 2, GFR 60-89 ml/min   Chronic vertigo   Overactive bladder   Elevated troponin   Macrocytic anemia   Chronic lower back pain    Assessment and Plan: Acute hypoxic respiratory failure secondary to CHF exacerbation Acute exacerbation of CHF Grade 2 diastolic heart failure preserved EF 55 to 60% Essential hypertension -Patient presenting with worsening shortness of breath and weight gain around 3 pound for last 2 days.  Of note patient has been admitted in the hospital 2 weeks ago for CHF exacerbation and treated with Lasix 20 mg IV twice daily and discharged home with oral Lasix 40 mg daily.  Patient reported compliant with diuretics however still having weight gain and shortness of breath. - In the ED initial lab check showed elevated BNP 842. -Echocardiogram from 07/27/2023 showed grade 2 diastolic heart failure preserved EF 55 to 60%. -Chest x-ray showed cardiomegaly and mild diffuse bronchitis change.  Atelectasis of the left lung base.  No consolidation pleural effusion. - Patient is reporting dyspnea and orthopnea.  At ED O2 sat dropped to 84% on room air and currently O2 sat has been improved to 100% on 2 L. - Concern for CHF exacerbation. - Plan to continue Lasix 40 mg IV twice daily - Continue to monitor urine output -Continue to monitor pulse ox continuously, continue nasal cannula oxygen as needed to keep O2 sat above 92% and wean down oxygen as patient tolerates. -Due to borderline low blood pressure unable to starting any ACE inhibitor/ARB or spironolactone. -Continue to monitor blood pressure monitor urine output and strict I's/O - Continue fluid restriction 2 L/day salt restriction 2  g/day  Elevated troponin-demand ischemia from CHF  exacerbation -Troponin 137 checked around 11 PM 9/12.  Patient denies any chest pain.  EKG showed normal sinus rhythm heart rate 72 and first-degree AV block, no evidence of ST and T wave abnormality.  Ruled out ACS. [facility] cardiology Dr. Geraldo Pitter.  Per discussion with cardiology elevated troponin likely secondary from CHF exacerbation however cardiology recommended to trend troponin. -Plan to trend troponin. - Continue to monitor for any development of chest pain. -Patient cardiology input over phone.  Holding formal consult to cardiology for now unless there is significant change of troponin.   Diverticulitis -Patient presenting with abdominal pain and poor appetite.  Patient denies any nausea, vomiting and diarrhea. - CBC showed elevated WBC count 13.8. -CT abdomen pelvis showed chronic diverticulosis,There is bowel wall thickening with surrounding inflammatory changes at the rectosigmoid colon, possible diverticulitis versus superimposed infectious or inflammatory colitis. -Checking GI panel.  Given patient does not have any recent antibiotic exposure and diarrhea low suspicion for C. difficile. - Checking fecal calprotectin. - Continue broad-spectrum antibiotic coverage with Zosyn. -Continue to monitor CBC and fever curve.  Prerenal AKI on CKD stage II -Creatinine 1.06.  Creatinine 0.9 two days ago.  GFR above 60.  Likely age-related chronically disease. - Acute kidney injury in the setting of CHF exacerbation. - Continue to treat with IV Lasix hoping that renal function will improve gradually. -Continue to monitor renal function. - Avoid nephrotoxic agents and renally adjust medication  Chronic first-degree AV block -EKG showed normal sinus rhythm heart rate 72 at first-degree AV block.  Compared with previous EKG from 07/26/2023 patient has first-degree AV block.  Discussed with on-call cardiology Dr. Geraldo Pitter.  Per cardiology it is safe to continue the beta-blocker given patient has  history of atrial fibrillation however if patient becomes symptomatic bradycardic in that case it is safer to hold. -At bedside patient heart rate ranging between 70-80. - Resume pindolol 2.5 mg twice daily. - Continue to monitor heart rate and any symptom of bradycardia. -Rechecking  EKG in the a.m. -Appreciate cardiology input over phone.  Holding further formal consult to cardiology for now.   Paroxysmal atrial fibrillation on Eliquis -Patient's CHA2DS2-VASc is 2 score 5. - Continue Eliquis 2.5 mg twice daily and pindolol 2.5 mg twice daily.  Non-insulin-dependent DM type II -At home patient is on metformin.  A1c 7.5 checked on 07/27/2023 -Holding metformin in the setting of acute kidney injury. - Continue POC blood glucose checks 3 times daily with meal and bedtime, continue SSI and at bedtime insulin as needed.  GERD -Continue Protonix 40 mg.  Restless leg syndrome -Continue ropinirole 0.5 mg at bedtime  Generalized anxiety disorder -Continue Zoloft 25 mg at bedtime  Chronic lower back pain -At home patient takes tramadol 50 mg every 4 hours however patient reporting to give something stronger than tramadol.  Given there is concern for respiratory depression holding morphine or Dilaudid for now. - Continue Percocet as needed  Overactive bladder -Continue mirabegron 50 mg daily - Requested bedside nurse to place an PureWick  Macrocytic anemia -Lab check showed stable H&H 10.5 and 31.  Elevated MCV of 107. -Checking anemia panel - Starting oral vitamin B12 and folic acid.  Chronic vertigo - During last hospital admission 2 weeks ago patient has been evaluated by inpatient physical therapy and recommended walker at home. -Plan to continue fall precaution and ambulate with assistance.  DVT prophylaxis:  Eliquis Code Status:  Full Code.  Verified with both  patient and patient's son at the bedside. Diet: Heart and carb modified diet with fluid restriction 2 L/day and salt  restriction 2 g/day Family Communication: Discussed treatment plan with patient's son at bedside Disposition Plan: Plan to discharge to home next 2 to 3 days Consults: none Admission status:   Inpatient, telemetry cardiac unit  Severity of Illness: The appropriate patient status for this patient is INPATIENT. Inpatient status is judged to be reasonable and necessary in order to provide the required intensity of service to ensure the patient's safety. The patient's presenting symptoms, physical exam findings, and initial radiographic and laboratory data in the context of their chronic comorbidities is felt to place them at high risk for further clinical deterioration. Furthermore, it is not anticipated that the patient will be medically stable for discharge from the hospital within 2 midnights of admission.   * I certify that at the point of admission it is my clinical judgment that the patient will require inpatient hospital care spanning beyond 2 midnights from the point of admission due to high intensity of service, high risk for further deterioration and high frequency of surveillance required.[name]    Tereasa Coop, MD [facility]  How to contact the [facility] Attending or Consulting provider 7A - 7P or covering provider during after hours 7P -7A, for this patient.  Check the care team in [employer] and look for a) attending/consulting [facility] provider listed and b) the [facility] team listed Log into [email or web address] and use [facility] universal password to access. If you do not have the password, please contact the hospital operator. Locate the [facility] provider you are looking for under [facility] and page to a number that you can be directly reached. If you still have difficulty reaching the provider, please page the [facility] (Director on Call) for the Hospitalists listed on amion for assistance.  08/11/2023, 3:27 AM

## 2023-08-11 NOTE — Progress Notes (Signed)
Mobility Specialist Progress Note:   08/11/23 1400  Mobility  Activity Ambulated with assistance in hallway  Level of Assistance Contact guard assist, steadying assist  Assistive Device Front wheel walker  Distance Ambulated (ft) 150 ft  Activity Response Tolerated well  Mobility Referral Yes  Mobility Specialist Start Time (ACUTE ONLY) 1400  Mobility Specialist Stop Time (ACUTE ONLY) 1430  Mobility Specialist Time Calculation (min) (ACUTE ONLY) 30 min   Pt agreed to mobility. Required MinG to ambulate with RW. C/o intermittent dizziness due to Vertigo. Pt was left in chair with alarm on with all needs met.   Addison Lank Mobility Specialist Please contact via SecureChat or  Rehab office at [phone]

## 2023-08-11 NOTE — Progress Notes (Signed)
OT Cancellation Note  Patient Details Name: Heather Olsen MRN: [id number] DOB: [birth year]   Cancelled Treatment:    Reason Eval/Treat Not Completed: Other (comment) Pt completed PT evaluation and mobility transfers. Pt appears to be at adequate level to return to friends home based on observation and tolerance for therapy. Pt with son arriving to deliver lunch and requesting to each a late lunch. Pt educated on gaze stabilization and placing visual cues in her home as guides. Son present for education. Pt states "oh yes bethany give me that last time I was here with an A on it. I have it at home." Pt was unable to verbalize what the letter was for and admits to not using it. Pt expressed concerns for cervical discomfort with vestibular treatment. Pt noted to have symptom with head turns to track therapist. Pt advised to use full body rotation and immediately reports that does help.   Ot to defer evaluation at this time. Pt is at an adequate level for d/c from self care stand point but could benefit from further vestibular work up.   Heather Olsen 08/11/2023, 3:19 PM

## 2023-08-11 NOTE — Evaluation (Signed)
Physical Therapy Evaluation Patient Details Name: Heather Olsen MRN: [id number] DOB: 1931/08/11 Today's Date: 08/11/2023  History of Present Illness  Pt is 89 yo presenting to [facility] ER for abdominal pain. Pt was admitted to the hospital 2 weeks ago fro acute CHF exacerbation. Per hospitalist note diverticulitis vs superimposed infectious or inflammatory colitis. PMH: Aortic stenosis, CAD, depression, diverticulosis, hearing loss, hiatal hernia, HLD, hyperglycemia, HTN, idiopathic scoliosis, mitral regurgitation, osteoporosis, pernicious anemia, Raynaud's disease, scoliosis, spinal stenosis of lumbar  Clinical Impression  Pt is presenting slightly below baseline level of functioning. Prior to hospitalization pt was Mod I for all functional mobility and is currently supervision due to dizziness and fatigue. Pt would benefit from vestibular therapy and to continue physical therapy services at [facility] on discharge from acute care hospital setting. Pt reports vertigo with R horizontal head turns that lasts less than 30 seconds most likely R horizontal BPPV. Will continue to follow pt in acute care hospital setting in order to ensure pt returns home safely with decreased risk for falls, injury and re-hospitalization            Equipment Recommendations None recommended by PT     Functional Status Assessment Patient has had a recent decline in their functional status and demonstrates the ability to make significant improvements in function in a reasonable and predictable amount of time.     Precautions / Restrictions Precautions Precautions: Fall Restrictions Weight Bearing Restrictions: No      Mobility  Bed Mobility Overal bed mobility: Needs Assistance Bed Mobility: Rolling, Sidelying to Sit, Sit to Supine Rolling: Supervision Sidelying to sit: Supervision   Sit to supine: Supervision   General bed mobility comments: Supervision due to dizziness and pt is very fatigued secondary to  she states she has not slept in 30 hours.    Transfers Overall transfer level: Needs assistance Equipment used: Rolling walker (2 wheels) Transfers: Sit to/from Stand Sit to Stand: Supervision           General transfer comment: Good technique with hand placement, supervision due to vertigo and fatigue.    Ambulation/Gait     General Gait Details: Pt deferred gait today due to fatigue. pt was able to take side steps at EOB with good floor clearance, navigation of AD and no buckling at the knees.      Balance Overall balance assessment: Mild deficits observed, not formally tested Sitting-balance support: No upper extremity supported, Feet supported Sitting balance-Leahy Scale: Good     Standing balance support: Bilateral upper extremity supported Standing balance-Leahy Scale: Fair         Pertinent Vitals/Pain Pain Assessment Pain Assessment: 0-10 Pain Score: 3  Pain Location: abdominal pain Pain Descriptors / Indicators: Aching, Cramping Pain Intervention(s): Monitored during session    Home Living Family/patient expects to be discharged to:: Private residence Living Arrangements: Alone Available Help at Discharge: Available PRN/intermittently Type of Home: Independent living facility (lives at [facility]) Home Access: Level entry       Home Layout: One level Home Equipment: Shower seat - built in;Rollator (4 wheels);Cane - single Librarian, academic (2 wheels)      Prior Function Prior Level of Function : Independent/Modified Independent             Mobility Comments: She did not use an assistive device for ambulation inside, however used a cane when ambulating to & from the dining hall and when out in the community. ADLs Comments: She was independent with ADLs,  performing light meal prep, and driving.     Extremity/Trunk Assessment   Upper Extremity Assessment Upper Extremity Assessment: Defer to OT evaluation    Lower Extremity  Assessment Lower Extremity Assessment: Generalized weakness    Cervical / Trunk Assessment Cervical / Trunk Assessment: Normal  Communication   Communication Communication: No apparent difficulties  Cognition Arousal: Alert Behavior During Therapy: WFL for tasks assessed/performed Overall Cognitive Status: Within Functional Limits for tasks assessed        General Comments General comments (skin integrity, edema, etc.): HR between 50-60 bpm througout session and O2 sats over 90% on Room air        Assessment/Plan    PT Assessment Patient needs continued PT services  PT Problem List Decreased balance;Decreased activity tolerance;Decreased mobility;Decreased safety awareness       PT Treatment Interventions DME instruction;Gait training;Functional mobility training;Therapeutic activities;Therapeutic exercise;Balance training;Patient/family education    PT Goals (Current goals can be found in the Care Plan section)  Acute Rehab PT Goals Patient Stated Goal: return home and improve dizziness PT Goal Formulation: With patient Time For Goal Achievement: 08/25/23 Potential to Achieve Goals: Good    Frequency Min 1X/week        AM-PAC PT "6 Clicks" Mobility  Outcome Measure Help needed turning from your back to your side while in a flat bed without using bedrails?: None Help needed moving from lying on your back to sitting on the side of a flat bed without using bedrails?: A Little Help needed moving to and from a bed to a chair (including a wheelchair)?: A Little Help needed standing up from a chair using your arms (e.g., wheelchair or bedside chair)?: A Little Help needed to walk in hospital room?: A Little Help needed climbing 3-5 steps with a railing? : A Little 6 Click Score: 19    End of Session Equipment Utilized During Treatment: Gait belt Activity Tolerance: Patient limited by fatigue Patient left: in bed;with call bell/phone within reach;with bed alarm  set Nurse Communication: Mobility status PT Visit Diagnosis: Muscle weakness (generalized) (M62.81);Other abnormalities of gait and mobility (R26.89);BPPV;Dizziness and giddiness (R42) BPPV - Right/Left : Right (horizontal canal)    Time: 6578-4696 PT Time Calculation (min) (ACUTE ONLY): 30 min   Charges:   PT Evaluation $PT Eval Low Complexity: 1 Low PT Treatments $Therapeutic Activity: 8-22 mins PT General Charges $$ ACUTE PT VISIT: 1 Visit         Harrel Carina, DPT, CLT  Acute Rehabilitation Services Office: [phone] (Secure chat preferred)   Claudia Desanctis 08/11/2023, 11:58 AM

## 2023-08-11 NOTE — Progress Notes (Signed)
Pharmacy Antibiotic Note  Heather Olsen is a 89 y.o. female admitted on 08/10/2023 with abdominal pain and CT abdomen showing possible diverticulitis versus superimposed infectious or inflammatory colitis.  Pharmacy has been consulted for zosyn dosing. PMH includes HfpEF, pAfib (Eliquis), T2DM, HTN, hx of sigmoid diverticulosis.  WBC 13.8, sCr 1.06 (bl~0.9-1), afebrile (low grade fever in ED), no cultures, given dose of Unasyn 3g IV x1 at [facility]  Plan: Zosyn 3.375gm IV every 8 hours Monitor renal function Follow up surgical intervention, LOT, signs of clinical improvement, cultures  Height: 5\' 2"  (157.5 cm) Weight: 49.4 kg (109 lb) IBW/kg (Calculated) : 50.1  Temp (24hrs), Avg:99.3 F (37.4 C), Min:99.3 F (37.4 C), Max:99.3 F (37.4 C)  Recent Labs  Lab 08/08/23 1601 08/09/23 0445 08/10/23 1720  WBC  --  8.8 13.8*  CREATININE 1.11* 0.92 1.06*    Estimated Creatinine Clearance: 27.5 mL/min (A) (by C-G formula based on SCr of 1.06 mg/dL (H)).    Allergies  Allergen Reactions   Sulfasalazine Other (See Comments)    Other reaction(s): Other (See Comments), headaches, headaches, headaches   Topiramate Rash   Ace Inhibitors Cough   Codeine Nausea Only   Ibandronic Acid Nausea Only   Meloxicam     Other reaction(s): GI upset, GI Upset (intolerance), Other (See Comments) unknown    Prevacid [Lansoprazole]     Chest pain   Sulfa Antibiotics Other (See Comments)    headaches   Verapamil Other (See Comments)    constipation   Zolpidem Other (See Comments)    hallucinations    Antimicrobials this admission: Unasyn 9/12 x1  Zosyn 9/13 >>   Thank you for allowing pharmacy to be a part of this patient's care.  Arabella Merles, PharmD. Clinical Pharmacist 08/11/2023 2:22 AM

## 2023-08-12 DIAGNOSIS — I5032 Chronic diastolic (congestive) heart failure: Secondary | ICD-10-CM | POA: Diagnosis not present

## 2023-08-12 DIAGNOSIS — J9601 Acute respiratory failure with hypoxia: Secondary | ICD-10-CM | POA: Diagnosis not present

## 2023-08-12 DIAGNOSIS — E119 Type 2 diabetes mellitus without complications: Secondary | ICD-10-CM | POA: Diagnosis not present

## 2023-08-12 DIAGNOSIS — N179 Acute kidney failure, unspecified: Secondary | ICD-10-CM | POA: Diagnosis not present

## 2023-08-12 LAB — BASIC METABOLIC PANEL
Anion gap: 5 (ref 5–15)
BUN: 23 mg/dL (ref 8–23)
CO2: 33 mmol/L — ABNORMAL HIGH (ref 22–32)
Calcium: 8.7 mg/dL — ABNORMAL LOW (ref 8.9–10.3)
Chloride: 93 mmol/L — ABNORMAL LOW (ref 98–111)
Creatinine, Ser: 1.04 mg/dL — ABNORMAL HIGH (ref 0.44–1.00)
GFR, Estimated: 51 mL/min — ABNORMAL LOW (ref >60)
Glucose, Bld: 149 mg/dL — ABNORMAL HIGH (ref 70–99)
Potassium: 3.8 mmol/L (ref 3.5–5.1)
Sodium: 131 mmol/L — ABNORMAL LOW (ref 135–145)

## 2023-08-12 LAB — GLUCOSE, CAPILLARY
Glucose-Capillary: 133 mg/dL — ABNORMAL HIGH (ref 70–99)
Glucose-Capillary: 189 mg/dL — ABNORMAL HIGH (ref 70–99)

## 2023-08-12 LAB — MAGNESIUM: Magnesium: 2.2 mg/dL (ref 1.7–2.4)

## 2023-08-12 MED ORDER — FUROSEMIDE 10 MG/ML PO SOLN
40.0000 mg | Freq: Every day | ORAL | Status: DC
Start: 2023-08-12 — End: 2023-08-15

## 2023-08-12 MED ORDER — FUROSEMIDE 10 MG/ML PO SOLN
20.0000 mg | Freq: Every day | ORAL | 12 refills | Status: DC
Start: 2023-08-12 — End: 2023-08-12

## 2023-08-12 NOTE — Discharge Summary (Addendum)
PATIENT DETAILS Name: Heather Olsen Age: 89 y.o. Sex: female Date of Birth: 1931-09-30 MRN: [id number]. Admitting Physician: Tereasa Coop, MD [id number], Zollie Beckers, MD  Admit Date: 08/10/2023 Discharge date: 08/12/2023  Recommendations for Outpatient Follow-up:  Follow up with PCP in 1-2 weeks Please obtain CMP/CBC in one week  Admitted From:  Home  Disposition: Home   Discharge Condition: good  CODE STATUS:   Code Status: Full Code   Diet recommendation:  Diet Order             Diet - low sodium heart healthy           Diet Carb Modified           Diet heart healthy/carb modified Room service appropriate? Yes; Fluid consistency: Thin; Fluid restriction: 2000 mL Fluid  Diet effective now                    Brief Summary: Patient is a 89 y.o.  female with history of chronic HFpEF, PAF, HTN, DM-2 presented with abdominal pain-found to have diverticulitis.   Significant events: 9/12>> admit to [facility]   Significant studies: 8/29>> echo: EF 55-60%.  RVSP 46.6 mmHg. 9/12>> CT abdomen/pelvis: Diverticulitis at the rectosigmoid junction.   Significant microbiology data: 9/12>> COVID PCR: Negative   Procedures: None   Consults: None  Brief Hospital Course: Acute sigmoid diverticulitis Clinically significantly better than on initial presentation-no LLQ pain-no fever-no leukocytosis.   Was on Zosyn-will transition to Augmentin. She will need GI evaluation in the outpatient setting-as apparently this is her second episode of diverticulitis in the past couple of months.     Chronic HFpEF No signs of volume overload-completely euvolemic on exam-although BNP elevated. Given IV Lasix on admit-has been transitioned to oral Lasix on 9/13  Continue oral furosemide on discharge-she has been on 40 mg daily in the hospital-she is euvolemic-she will be continued on the same dosing (per patient she is also taking 40 mg daily at home as well)   Note-acute on chronic  HFpEF/acute hypoxic respiratory failure ruled out   Minimally elevated troponin No chest pain Recent echo stable Likely demand ischemia Given advanced age-doubt further workup is required for this incidental finding-as patient presented with abdominal pain.     Moderate pulmonary hypertension Stable for continued follow-up outpatient   CKD stage II Creatinine close to baseline AKI ruled out   PAF Pindolol/Eliquis   HLD Statin   DM-2 (A1c 7.5 on 8/29) Resume metformin on discharge  GERD PPI   Chronic back pain As needed narcotics At baseline   Overactive bladder Continue mirabegron   Anxiety disorder Stable Zoloft   History of vertigo Intermittent/chronic issue PT/OT -Home health recommended.   BMI: Estimated body mass index is 20 kg/m as calculated from the following:   Height as of this encounter: 5\' 2"  (1.575 m).   Weight as of this encounter: 49.6 kg.  Note-called son Rick-left voicemail.   Discharge Diagnoses:  Principal Problem:   Acute exacerbation of CHF (congestive heart failure) (HCC) Active Problems:   Diverticulitis   Chronic diastolic CHF (congestive heart failure) (HCC)   AKI (acute kidney injury) on CKD stage II (HCC)   First degree AV block   Acute hypoxic respiratory failure (HCC)   Essential hypertension   Hyperlipidemia   Paroxysmal atrial fibrillation (HCC)   Non-insulin dependent type 2 diabetes mellitus (HCC)   GERD (gastroesophageal reflux disease)   Restless leg syndrome   GAD (generalized anxiety disorder)  Hypoxia   CKD (chronic kidney disease) stage 2, GFR 60-89 ml/min   Chronic vertigo   Overactive bladder   Elevated troponin   Macrocytic anemia   Chronic lower back pain   Discharge Instructions:  Activity:  As tolerated   Discharge Instructions     Call MD for:  difficulty breathing, headache or visual disturbances   Complete by: As directed    Call MD for:  persistant dizziness or light-headedness    Complete by: As directed    Call MD for:  persistant nausea and vomiting   Complete by: As directed    Call MD for:  severe uncontrolled pain   Complete by: As directed    Diet - low sodium heart healthy   Complete by: As directed    Diet Carb Modified   Complete by: As directed    Discharge instructions   Complete by: As directed    Follow with Primary MD  Merri Brunette, MD in 1-2 weeks  Please ask your primary care doctor to see if he can get a referral to gastroenterology given recurrent episodes of diverticulitis.  Please get a complete blood count and chemistry panel checked by your Primary MD at your next visit, and again as instructed by your Primary MD.  Get Medicines reviewed and adjusted: Please take all your medications with you for your next visit with your Primary MD  Laboratory/radiological data: Please request your Primary MD to go over all hospital tests and procedure/radiological results at the follow up, please ask your Primary MD to get all Hospital records sent to his/her office.  In some cases, they will be blood work, cultures and biopsy results pending at the time of your discharge. Please request that your primary care M.D. follows up on these results.  Also Note the following: If you experience worsening of your admission symptoms, develop shortness of breath, life threatening emergency, suicidal or homicidal thoughts you must seek medical attention immediately by calling 911 or calling your MD immediately  if symptoms less severe.  You must read complete instructions/literature along with all the possible adverse reactions/side effects for all the Medicines you take and that have been prescribed to you. Take any new Medicines after you have completely understood and accpet all the possible adverse reactions/side effects.   Do not drive when taking Pain medications or sleeping medications (Benzodaizepines)  Do not take more than prescribed Pain, Sleep and  Anxiety Medications. It is not advisable to combine anxiety,sleep and pain medications without talking with your primary care practitioner  Special Instructions: If you have smoked or chewed Tobacco  in the last 2 yrs please stop smoking, stop any regular Alcohol  and or any Recreational drug use.  Wear Seat belts while driving.  Please note: You were cared for by a hospitalist during your hospital stay. Once you are discharged, your primary care physician will handle any further medical issues. Please note that NO REFILLS for any discharge medications will be authorized once you are discharged, as it is imperative that you return to your primary care physician (or establish a relationship with a primary care physician if you do not have one) for your post hospital discharge needs so that they can reassess your need for medications and monitor your lab values.   Increase activity slowly   Complete by: As directed       Allergies as of 08/12/2023       Reactions   Sulfasalazine Other (See Comments)  Other reaction(s): Other (See Comments), headaches, headaches, headaches   Topiramate Rash   Ace Inhibitors Cough   Codeine Nausea Only   Ibandronic Acid Nausea Only   Meloxicam    Other reaction(s): GI upset, GI Upset (intolerance), Other (See Comments) unknown   Prevacid [lansoprazole]    Chest pain   Sulfa Antibiotics Other (See Comments)   headaches   Verapamil Other (See Comments)   constipation   Zolpidem Other (See Comments)   hallucinations        Medication List     TAKE these medications    acetaminophen 650 MG CR tablet Commonly known as: TYLENOL Take 1,300 mg by mouth 2 (two) times daily.   amoxicillin-clavulanate 875-125 MG tablet Commonly known as: AUGMENTIN Take 1 tablet by mouth every 12 (twelve) hours for 10 days.   Eliquis 2.5 MG Tabs tablet Generic drug: apixaban Take 2.5 mg by mouth 2 (two) times daily.   furosemide 10 MG/ML solution Commonly known  as: LASIX Take 4 mLs (40 mg total) by mouth daily. What changed:  how much to take when to take this reasons to take this   ipratropium 0.06 % nasal spray Commonly known as: ATROVENT Use 2 sprays in each nostril up to three times daily before meals for sinus drainage. What changed:  how much to take how to take this when to take this additional instructions   Linzess 145 MCG Caps capsule Generic drug: linaclotide Take 145 mcg by mouth daily as needed (constipation).   metFORMIN 500 MG 24 hr tablet Commonly known as: GLUCOPHAGE-XR Take 1,000 mg by mouth every evening.   mirabegron ER 50 MG Tb24 tablet Commonly known as: MYRBETRIQ Take 50 mg by mouth daily.   MULTIPLE VITAMINS-MINERALS PO Take 1 tablet by mouth daily.   nitroGLYCERIN 0.4 MG SL tablet Commonly known as: NITROSTAT Place 1 tablet (0.4 mg total) under the tongue every 5 (five) minutes as needed for chest pain. Place one tablet under the tongue every 5 minutes as needed for chest pain. No more than 3   pantoprazole 40 MG tablet Commonly known as: PROTONIX Take 40 mg by mouth 2 (two) times daily.   pindolol 5 MG tablet Commonly known as: VISKEN Take 1/2 (one-half) tablet by mouth twice daily What changed: See the new instructions.   PROBIOTIC DAILY PO Take 1 capsule by mouth every evening.   Prolia 60 MG/ML Sosy injection Generic drug: denosumab Inject 60 mg into the skin every 6 (six) months.   rOPINIRole 0.5 MG tablet Commonly known as: REQUIP Take 0.5 mg by mouth at bedtime.   rosuvastatin 10 MG tablet Commonly known as: CRESTOR TAKE 1 TABLET BY MOUTH AT BEDTIME What changed: when to take this   sertraline 25 MG tablet Commonly known as: ZOLOFT Take 25 mg by mouth at bedtime.   traMADol 50 MG tablet Commonly known as: ULTRAM TAKE 1 TABLET BY MOUTH EVERY 4 HOURS What changed: See the new instructions.   traZODone 50 MG tablet Commonly known as: DESYREL Take 50 mg by mouth 2 (two) times  daily.   Vitamin D3 50 MCG (2000 UT) capsule Take 2,000 Units by mouth daily.        Follow-up Information     Merri Brunette, MD. Schedule an appointment as soon as possible for a visit in 3 days.   Specialty: Internal Medicine Contact information: [address] [place] [address] [place] [address] [phone]  Allergies  Allergen Reactions   Sulfasalazine Other (See Comments)    Other reaction(s): Other (See Comments), headaches, headaches, headaches   Topiramate Rash   Ace Inhibitors Cough   Codeine Nausea Only   Ibandronic Acid Nausea Only   Meloxicam     Other reaction(s): GI upset, GI Upset (intolerance), Other (See Comments) unknown    Prevacid [Lansoprazole]     Chest pain   Sulfa Antibiotics Other (See Comments)    headaches   Verapamil Other (See Comments)    constipation   Zolpidem Other (See Comments)    hallucinations     Other Procedures/Studies: DG Chest Portable 1 View  Result Date: 08/10/2023 CLINICAL DATA:  Hypoxia EXAM: PORTABLE CHEST 1 VIEW COMPARISON:  07/26/2023 FINDINGS: Marked scoliosis. Cardiomegaly. Mild diffuse bronchitic changes. Atelectasis left base. Aortic atherosclerosis. No consolidation or pleural effusion. No pneumothorax. IMPRESSION: 1. Cardiomegaly. 2. Mild bronchitic changes. Electronically Signed   By: Jasmine Pang M.D.   On: 08/10/2023 23:40   CT ABDOMEN PELVIS W CONTRAST  Result Date: 08/10/2023 CLINICAL DATA:  Left lower quadrant abdominal pain and diarrhea. History of diverticulitis and IBS. EXAM: CT ABDOMEN AND PELVIS WITH CONTRAST TECHNIQUE: Multidetector CT imaging of the abdomen and pelvis was performed using the standard protocol following bolus administration of intravenous contrast. RADIATION DOSE REDUCTION: This exam was performed according to the departmental dose-optimization program which includes automated exposure control, adjustment of the mA and/or kV according to patient size and/or use  of iterative reconstruction technique. CONTRAST:  75mL OMNIPAQUE IOHEXOL 300 MG/ML  SOLN COMPARISON:  08/09/2023, 07/18/2023. FINDINGS: Lower chest: The heart is enlarged. Mild atelectasis or scarring is noted at the lung bases. Hepatobiliary: There is a vague hypodensity in the anterior right lobe of the liver measuring 1.6 x 1.1 cm, previously characterized as hemangioma. No biliary ductal dilatation. The gallbladder is without stones. Pancreas: Pancreatic atrophy is noted. No pancreatic ductal dilatation or surrounding inflammatory changes. Spleen: Normal in size without focal abnormality. Adrenals/Urinary Tract: The adrenal glands are within normal limits. The kidneys enhance symmetrically. Subcentimeter hypodensities are noted in the kidneys bilaterally which are too small to further characterize. No renal calculus or hydronephrosis. The bladder is unremarkable. Stomach/Bowel: Stomach is within normal limits. Appendix is not seen. No bowel obstruction, free air or pneumatosis. Scattered diverticula are present along the colon. There is bowel wall thickening with surrounding inflammatory changes involving the sigmoid colon and rectum. Vascular/Lymphatic: Aortic atherosclerosis. A vascular stent is noted in the SMA. No abdominal or pelvic lymphadenopathy. Reproductive: Uterus and bilateral adnexa are unremarkable. Other: No abdominopelvic ascites. Musculoskeletal: There scoliosis of the thoracolumbar spine with degenerative changes. No acute osseous abnormality. IMPRESSION: 1. Colonic diverticulosis. There is bowel wall thickening with surrounding inflammatory changes at the rectosigmoid colon, possible diverticulitis versus superimposed infectious or inflammatory colitis. 2. Aortic atherosclerosis. Electronically Signed   By: Thornell Sartorius M.D.   On: 08/10/2023 20:29   CT Renal Stone Study  Result Date: 08/09/2023 CLINICAL DATA:  Urinary retention EXAM: CT ABDOMEN AND PELVIS WITHOUT CONTRAST TECHNIQUE:  Multidetector CT imaging of the abdomen and pelvis was performed following the standard protocol without IV contrast. RADIATION DOSE REDUCTION: This exam was performed according to the departmental dose-optimization program which includes automated exposure control, adjustment of the mA and/or kV according to patient size and/or use of iterative reconstruction technique. COMPARISON:  07/26/2023 FINDINGS: Lower chest: No acute abnormality Hepatobiliary: No focal liver abnormality is seen. No gallstones, gallbladder wall thickening, or biliary dilatation. Pancreas: Unremarkable.  No pancreatic ductal dilatation or surrounding inflammatory changes. Spleen: Normal in size without focal abnormality. Adrenals/Urinary Tract: The adrenal glands are normal. No nephroureterolithiasis or hydroureteronephrosis. The urinary bladder is distended without visible obstruction of the urethra. Stomach/Bowel: There is rectosigmoid diverticulosis without acute inflammation. No dilated small bowel. Appendix not clearly visualized. Stomach and duodenum are unremarkable. Vascular/Lymphatic: Calcific aortic atherosclerosis. No abdominal or pelvic lymphadenopathy. Reproductive: Normal uterus and adnexa. Other: No abdominal wall hernia or abnormality. No abdominopelvic ascites. Musculoskeletal: Marked scoliosis of the thoracolumbar spine. No acute abnormality. IMPRESSION: 1. No nephroureterolithiasis or hydroureteronephrosis. 2. Distended urinary bladder without visible obstruction of the urethra. 3. Rectosigmoid diverticulosis without acute inflammation. Aortic Atherosclerosis (ICD10-I70.0). Electronically Signed   By: Deatra Robinson M.D.   On: 08/09/2023 03:46   ECHOCARDIOGRAM COMPLETE  Result Date: 07/27/2023    ECHOCARDIOGRAM REPORT   Patient Name:   Heather Olsen Date of Exam: 07/27/2023 Medical Rec #:  [id number]   Height:       64.0 in Accession #:    [id number]  Weight:       106.7 lb Date of Birth:  [birth year]   BSA:          1.498 m  Patient Age:    89 years    BP:           159/76 mmHg Patient Gender: F           HR:           62 bpm. Exam Location:  Inpatient Procedure: 2D Echo Indications:    acute diastolic chf  History:        Patient has prior history of Echocardiogram examinations, most                 recent 08/23/2022. CHF, Raynauds, Arrythmias:first degree AV                 degree; Risk Factors:Hypertension, Dyslipidemia and Diabetes.  Sonographer:    Delcie Roch RDCS Referring Phys: [id number] SUBRINA SUNDIL IMPRESSIONS  1. Left ventricular ejection fraction, by estimation, is 55 to 60%. The left ventricle has normal function. The left ventricle has no regional wall motion abnormalities. Left ventricular diastolic parameters are consistent with Grade II diastolic dysfunction (pseudonormalization).  2. Right ventricular systolic function is normal. The right ventricular size is mildly enlarged. There is moderately elevated pulmonary artery systolic pressure. The estimated right ventricular systolic pressure is 46.6 mmHg.  3. Left atrial size was mildly dilated.  4. A small pericardial effusion is present.  5. The mitral valve is grossly normal. Mild mitral valve regurgitation. No evidence of mitral stenosis.  6. Tricuspid valve regurgitation is mild to moderate.  7. The aortic valve is grossly normal. Aortic valve regurgitation is not visualized. No aortic stenosis is present.  8. The inferior vena cava is normal in size with greater than 50% respiratory variability, suggesting right atrial pressure of 3 mmHg. FINDINGS  Left Ventricle: Left ventricular ejection fraction, by estimation, is 55 to 60%. The left ventricle has normal function. The left ventricle has no regional wall motion abnormalities. The left ventricular internal cavity size was normal in size. There is  no left ventricular hypertrophy. Left ventricular diastolic parameters are consistent with Grade II diastolic dysfunction (pseudonormalization). Right Ventricle: The  right ventricular size is mildly enlarged. No increase in right ventricular wall thickness. Right ventricular systolic function is normal. There is moderately elevated pulmonary artery systolic pressure. The tricuspid regurgitant velocity is 3.30 m/s, and with  an assumed right atrial pressure of 3 mmHg, the estimated right ventricular systolic pressure is 46.6 mmHg. Left Atrium: Left atrial size was mildly dilated. Right Atrium: Right atrial size was normal in size. Pericardium: A small pericardial effusion is present. Mitral Valve: The mitral valve is grossly normal. Mild mitral valve regurgitation. No evidence of mitral valve stenosis. Tricuspid Valve: The tricuspid valve is normal in structure. Tricuspid valve regurgitation is mild to moderate. No evidence of tricuspid stenosis. Aortic Valve: The aortic valve is grossly normal. Aortic valve regurgitation is not visualized. No aortic stenosis is present. Pulmonic Valve: The pulmonic valve was normal in structure. Pulmonic valve regurgitation is trivial. No evidence of pulmonic stenosis. Aorta: The aortic root is normal in size and structure. Venous: The inferior vena cava is normal in size with greater than 50% respiratory variability, suggesting right atrial pressure of 3 mmHg. IAS/Shunts: No atrial level shunt detected by color flow Doppler.  LEFT VENTRICLE PLAX 2D LVIDd:         3.20 cm   Diastology LVIDs:         2.10 cm   LV e' medial:    6.20 cm/s LV PW:         0.90 cm   LV E/e' medial:  18.9 LV IVS:        0.80 cm   LV e' lateral:   6.85 cm/s LVOT diam:     1.90 cm   LV E/e' lateral: 17.1 LV SV:         59 LV SV Index:   39 LVOT Area:     2.84 cm  RIGHT VENTRICLE             IVC RV Basal diam:  2.40 cm     IVC diam: 1.40 cm RV S prime:     17.40 cm/s TAPSE (M-mode): 2.5 cm LEFT ATRIUM           Index        RIGHT ATRIUM           Index LA diam:      2.50 cm 1.67 cm/m   RA Area:     16.80 cm LA Vol (A4C): 46.1 ml 30.78 ml/m  RA Volume:   42.40 ml  28.31  ml/m  AORTIC VALVE LVOT Vmax:   95.40 cm/s LVOT Vmean:  68.300 cm/s LVOT VTI:    0.208 m  AORTA Ao Root diam: 3.10 cm Ao Asc diam:  3.50 cm MITRAL VALVE                TRICUSPID VALVE MV Area (PHT): 3.99 cm     TR Peak grad:   43.6 mmHg MV Decel Time: 190 msec     TR Vmax:        330.00 cm/s MV E velocity: 117.00 cm/s MV A velocity: 83.20 cm/s   SHUNTS MV E/A ratio:  1.41         Systemic VTI:  0.21 m                             Systemic Diam: 1.90 cm Weston Brass MD Electronically signed by Weston Brass MD Signature Date/Time: 07/27/2023/3:06:15 PM    Final    CT ABDOMEN PELVIS W CONTRAST  Result Date: 07/26/2023 CLINICAL DATA:  Left lower quadrant abdominal pain. EXAM: CT ABDOMEN AND PELVIS WITH CONTRAST TECHNIQUE: Multidetector CT imaging of the abdomen and pelvis was performed  using the standard protocol following bolus administration of intravenous contrast. RADIATION DOSE REDUCTION: This exam was performed according to the departmental dose-optimization program which includes automated exposure control, adjustment of the mA and/or kV according to patient size and/or use of iterative reconstruction technique. CONTRAST:  60mL OMNIPAQUE IOHEXOL 300 MG/ML  SOLN COMPARISON:  CT abdomen and pelvis 05/03/2023 FINDINGS: Lower chest: No acute abnormality. Hepatobiliary: Vague hypodensity in the liver measuring 1.3 x 1.3 cm becomes isodense to the liver on delayed imaging most compatible with hemangioma. There is no biliary ductal dilatation or acute gallbladder abnormality. Pancreas: Unremarkable. No pancreatic ductal dilatation or surrounding inflammatory changes. Spleen: Spleen is normal in size. There are calcified granulomas in the spleen. Adrenals/Urinary Tract: There are scattered rounded hypodensities in both kidneys which are too small to characterize, likely cysts. There is no hydronephrosis or perinephric fat stranding. The adrenal glands and bladder are within normal limits. Stomach/Bowel: There  severe sigmoid colon diverticulosis. There is no evidence for active inflammation. There is no bowel obstruction, pneumatosis or free air. Appendix is not seen. Small bowel and stomach are within normal limits. Vascular/Lymphatic: Aortic atherosclerosis. No enlarged abdominal or pelvic lymph nodes. Superior mesenteric artery stent is present. Reproductive: Uterus and adnexa are within normal limits. Other: No abdominal wall hernia or abnormality. No abdominopelvic ascites. Musculoskeletal: There is levoconvex curvature of the spine. Degenerative changes affect the spine, right hip and pubic symphysis. IMPRESSION: 1. No acute localizing process in the abdomen or pelvis. 2. Severe sigmoid colon diverticulosis without evidence for acute diverticulitis. Aortic Atherosclerosis (ICD10-I70.0). Electronically Signed   By: Darliss Cheney M.D.   On: 07/26/2023 17:41   DG Chest 2 View  Result Date: 07/26/2023 CLINICAL DATA:  History of CHF.  Swelling in the legs for 10 days. EXAM: CHEST - 2 VIEW COMPARISON:  08/30/2022 FINDINGS: Again noted is S-shaped scoliosis in the thoracic and lumbar spine. Compared to the previous chest radiograph, there is improved aeration in the lungs particularly at the right lung base. Heart size is upper limits of normal and stable. Atherosclerotic calcifications at the aortic arch. Hazy densities in both lungs but no focal airspace disease or overt pulmonary edema. Stable sclerosis or densities in the region of the right first rib. IMPRESSION: 1. No acute cardiopulmonary disease. 2. Severe scoliosis. Electronically Signed   By: Richarda Overlie M.D.   On: 07/26/2023 15:47     TODAY-DAY OF DISCHARGE:  Subjective:   Heather Olsen today has no headache,no chest abdominal pain,no new weakness tingling or numbness, feels much better wants to go home today.   Objective:   Blood pressure (!) 136/98, pulse 69, temperature 98 F (36.7 C), temperature source Oral, resp. rate 20, height 5\' 2"  (1.575  m), weight 54.4 kg, SpO2 91%. No intake or output data in the 24 hours ending 08/12/23 0923 Filed Weights   08/10/23 1647 08/11/23 0500 08/12/23 0500  Weight: 49.4 kg 49.6 kg 54.4 kg    Exam: Awake Alert, Oriented *3, No new F.N deficits, Normal affect [place].AT,PERRAL Supple Neck,No JVD, No cervical lymphadenopathy appriciated.  Symmetrical Chest wall movement, Good air movement bilaterally, CTAB RRR,No Gallops,Rubs or new Murmurs, No Parasternal Heave +ve B.Sounds, Abd Soft, Non tender, No organomegaly appriciated, No rebound -guarding or rigidity. No Cyanosis, Clubbing or edema, No new Rash or bruise   PERTINENT RADIOLOGIC STUDIES: DG Chest Portable 1 View  Result Date: 08/10/2023 CLINICAL DATA:  Hypoxia EXAM: PORTABLE CHEST 1 VIEW COMPARISON:  07/26/2023 FINDINGS: Marked scoliosis. Cardiomegaly. Mild  diffuse bronchitic changes. Atelectasis left base. Aortic atherosclerosis. No consolidation or pleural effusion. No pneumothorax. IMPRESSION: 1. Cardiomegaly. 2. Mild bronchitic changes. Electronically Signed   By: Jasmine Pang M.D.   On: 08/10/2023 23:40   CT ABDOMEN PELVIS W CONTRAST  Result Date: 08/10/2023 CLINICAL DATA:  Left lower quadrant abdominal pain and diarrhea. History of diverticulitis and IBS. EXAM: CT ABDOMEN AND PELVIS WITH CONTRAST TECHNIQUE: Multidetector CT imaging of the abdomen and pelvis was performed using the standard protocol following bolus administration of intravenous contrast. RADIATION DOSE REDUCTION: This exam was performed according to the departmental dose-optimization program which includes automated exposure control, adjustment of the mA and/or kV according to patient size and/or use of iterative reconstruction technique. CONTRAST:  75mL OMNIPAQUE IOHEXOL 300 MG/ML  SOLN COMPARISON:  08/09/2023, 07/18/2023. FINDINGS: Lower chest: The heart is enlarged. Mild atelectasis or scarring is noted at the lung bases. Hepatobiliary: There is a vague hypodensity in the  anterior right lobe of the liver measuring 1.6 x 1.1 cm, previously characterized as hemangioma. No biliary ductal dilatation. The gallbladder is without stones. Pancreas: Pancreatic atrophy is noted. No pancreatic ductal dilatation or surrounding inflammatory changes. Spleen: Normal in size without focal abnormality. Adrenals/Urinary Tract: The adrenal glands are within normal limits. The kidneys enhance symmetrically. Subcentimeter hypodensities are noted in the kidneys bilaterally which are too small to further characterize. No renal calculus or hydronephrosis. The bladder is unremarkable. Stomach/Bowel: Stomach is within normal limits. Appendix is not seen. No bowel obstruction, free air or pneumatosis. Scattered diverticula are present along the colon. There is bowel wall thickening with surrounding inflammatory changes involving the sigmoid colon and rectum. Vascular/Lymphatic: Aortic atherosclerosis. A vascular stent is noted in the SMA. No abdominal or pelvic lymphadenopathy. Reproductive: Uterus and bilateral adnexa are unremarkable. Other: No abdominopelvic ascites. Musculoskeletal: There scoliosis of the thoracolumbar spine with degenerative changes. No acute osseous abnormality. IMPRESSION: 1. Colonic diverticulosis. There is bowel wall thickening with surrounding inflammatory changes at the rectosigmoid colon, possible diverticulitis versus superimposed infectious or inflammatory colitis. 2. Aortic atherosclerosis. Electronically Signed   By: Thornell Sartorius M.D.   On: 08/10/2023 20:29     PERTINENT LAB RESULTS: CBC: Recent Labs    08/10/23 1720 08/11/23 0709  WBC 13.8* 9.2  HGB 10.5* 9.1*  HCT 31.8* 28.4*  PLT 275 246   CMET CMP     Component Value Date/Time   NA 131 (L) 08/12/2023 0446   NA 135 08/08/2023 1601   K 3.8 08/12/2023 0446   CL 93 (L) 08/12/2023 0446   CO2 33 (H) 08/12/2023 0446   GLUCOSE 149 (H) 08/12/2023 0446   BUN 23 08/12/2023 0446   BUN 27 08/08/2023 1601    CREATININE 1.04 (H) 08/12/2023 0446   CREATININE 0.91 (H) 09/14/2016 1415   CALCIUM 8.7 (L) 08/12/2023 0446   PROT 5.9 (L) 08/11/2023 0709   ALBUMIN 3.0 (L) 08/11/2023 0709   AST 27 08/11/2023 0709   ALT 17 08/11/2023 0709   ALKPHOS 94 08/11/2023 0709   BILITOT 0.7 08/11/2023 0709   EGFR 47 (L) 08/08/2023 1601   GFRNONAA 51 (L) 08/12/2023 0446   GFRNONAA 44 (L) 06/21/2016 1551    GFR Estimated Creatinine Clearance: 28.4 mL/min (A) (by C-G formula based on SCr of 1.04 mg/dL (H)). Recent Labs    08/10/23 1720  LIPASE 19   No results for input(s): "CKTOTAL", "CKMB", "CKMBINDEX", "TROPONINI" in the last 72 hours. Invalid input(s): "POCBNP" No results for input(s): "DDIMER" in the last 72  hours. No results for input(s): "HGBA1C" in the last 72 hours. No results for input(s): "CHOL", "HDL", "LDLCALC", "TRIG", "CHOLHDL", "LDLDIRECT" in the last 72 hours. No results for input(s): "TSH", "T4TOTAL", "T3FREE", "THYROIDAB" in the last 72 hours.  Invalid input(s): "FREET3" Recent Labs    08/11/23 0709 08/11/23 0711  VITAMINB12  --  542  FOLATE 31.2  --   FERRITIN 35  --   TIBC 312  --   IRON 22*  --   RETICCTPCT  --  1.8   Coags: No results for input(s): "INR" in the last 72 hours.  Invalid input(s): "PT" Microbiology: Recent Results (from the past 240 hour(s))  SARS Coronavirus 2 by RT PCR (hospital order, performed in [facility] hospital lab) *cepheid single result test* Anterior Nasal Swab     Status: None   Collection Time: 08/10/23  5:20 PM   Specimen: Anterior Nasal Swab  Result Value Ref Range Status   SARS Coronavirus 2 by RT PCR NEGATIVE NEGATIVE Final    Comment: (NOTE) SARS-CoV-2 target nucleic acids are NOT DETECTED.  The SARS-CoV-2 RNA is generally detectable in upper and lower respiratory specimens during the acute phase of infection. The lowest concentration of SARS-CoV-2 viral copies this assay can detect is 250 copies / mL. A negative result does not  preclude SARS-CoV-2 infection and should not be used as the sole basis for treatment or other patient management decisions.  A negative result may occur with improper specimen collection / handling, submission of specimen other than nasopharyngeal swab, presence of viral mutation(s) within the areas targeted by this assay, and inadequate number of viral copies (<250 copies / mL). A negative result must be combined with clinical observations, patient history, and epidemiological information.  Fact Sheet for Patients:   RoadLapTop.co.za  Fact Sheet for Healthcare Providers: http://kim-miller.com/  This test is not yet approved or  cleared by the [place] FDA and has been authorized for detection and/or diagnosis of SARS-CoV-2 by FDA under an Emergency Use Authorization (EUA).  This EUA will remain in effect (meaning this test can be used) for the duration of the COVID-19 declaration under Section 564(b)(1) of the Act, 21 U.S.C. section 360bbb-3(b)(1), unless the authorization is terminated or revoked sooner.  Performed at [facility], [address] [place], [place] [address]     FURTHER DISCHARGE INSTRUCTIONS:  Get Medicines reviewed and adjusted: Please take all your medications with you for your next visit with your Primary MD  Laboratory/radiological data: Please request your Primary MD to go over all hospital tests and procedure/radiological results at the follow up, please ask your Primary MD to get all Hospital records sent to his/her office.  In some cases, they will be blood work, cultures and biopsy results pending at the time of your discharge. Please request that your primary care M.D. goes through all the records of your hospital data and follows up on these results.  Also Note the following: If you experience worsening of your admission symptoms, develop shortness of breath, life threatening  emergency, suicidal or homicidal thoughts you must seek medical attention immediately by calling 911 or calling your MD immediately  if symptoms less severe.  You must read complete instructions/literature along with all the possible adverse reactions/side effects for all the Medicines you take and that have been prescribed to you. Take any new Medicines after you have completely understood and accpet all the possible adverse reactions/side effects.   Do not drive when taking Pain medications or  sleeping medications (Benzodaizepines)  Do not take more than prescribed Pain, Sleep and Anxiety Medications. It is not advisable to combine anxiety,sleep and pain medications without talking with your primary care practitioner  Special Instructions: If you have smoked or chewed Tobacco  in the last 2 yrs please stop smoking, stop any regular Alcohol  and or any Recreational drug use.  Wear Seat belts while driving.  Please note: You were cared for by a hospitalist during your hospital stay. Once you are discharged, your primary care physician will handle any further medical issues. Please note that NO REFILLS for any discharge medications will be authorized once you are discharged, as it is imperative that you return to your primary care physician (or establish a relationship with a primary care physician if you do not have one) for your post hospital discharge needs so that they can reassess your need for medications and monitor your lab values.  Total Time spent coordinating discharge including counseling, education and face to face time equals greater than 30 minutes.  SignedJeoffrey Massed 08/12/2023 9:23 AM

## 2023-08-12 NOTE — TOC Transition Note (Signed)
Transition of Care [facility]) - CM/SW Discharge Note   Patient Details  Name: Heather Olsen MRN: [id number] Date of Birth: 01/10/1931  [facility] CM/SW Contact:  Lawerance Sabal, RN Phone Number: 08/12/2023, 10:19 AM   Clinical Narrative:     Sherron Monday w patient's son over the phone. He will provide transportation home.  Faxed referral to OP provider to continue services established PTA   Final next level of care: Home/Self Care Barriers to Discharge: No Barriers Identified   Patient Goals and CMS Choice CMS Medicare.gov Compare Post Acute Care list provided to:: Other (Comment Required) Choice offered to / list presented to : Adult Children  Discharge Placement                         Discharge Plan and Services Additional resources added to the After Visit Summary for                  DME Arranged: N/A             Date HH Agency Contacted: 08/12/23 Time HH Agency Contacted: 1018 Representative spoke with at [facility] Agency: Faxed referral to continue OP services through [facility] to [facility] at [phone]  Social Determinants of Health (SDOH) Interventions SDOH Screenings   Food Insecurity: No Food Insecurity (08/11/2023)  Housing: Patient Declined (08/11/2023)  Transportation Needs: No Transportation Needs (08/11/2023)  Utilities: Not At Risk (08/11/2023)  Tobacco Use: Low Risk  (08/11/2023)     Readmission Risk Interventions     No data to display

## 2023-08-12 NOTE — Evaluation (Signed)
Occupational Therapy Evaluation Patient Details Name: Heather Olsen MRN: [id number] DOB: [birth year] Today's Date: 08/12/2023   History of Present Illness Pt is 89 yo presenting to [facility] ER for abdominal pain. Pt was admitted to the hospital 2 weeks ago fro acute CHF exacerbation. Per hospitalist note diverticulitis vs superimposed infectious or inflammatory colitis. PMH: Aortic stenosis, CAD, depression, diverticulosis, hearing loss, hiatal hernia, HLD, hyperglycemia, HTN, idiopathic scoliosis, mitral regurgitation, osteoporosis, pernicious anemia, Raynaud's disease, scoliosis, spinal stenosis of lumbar   Clinical Impression   Pt admitted for above, has periods of dizziness with excessive head movements. Educated pt on vestibular exercises (see below) and trialed them to reinforce understanding, handout provided. Pt does need cueing occasionally to maintain a fixated gaze and keeping head upright during mobility, otherwise she is CGA to Supervision. Pt would benefit from continued acute skilled OT while in acute setting to reinforce vestibular exercises. Patient would benefit from post acute Home OT services to help maximize functional independence in natural environment       If plan is discharge home, recommend the following: A little help with bathing/dressing/bathroom;Assistance with cooking/housework    Functional Status Assessment  Patient has had a recent decline in their functional status and demonstrates the ability to make significant improvements in function in a reasonable and predictable amount of time.  Equipment Recommendations  None recommended by OT    Recommendations for Other Services       Precautions / Restrictions Precautions Precautions: Fall Precaution Comments: Vestibular troubles Restrictions Weight Bearing Restrictions: No      Mobility Bed Mobility Overal bed mobility: Needs Assistance Bed Mobility: Rolling, Sidelying to Sit, Sit to Supine Rolling:  Supervision Sidelying to sit: Supervision   Sit to supine: Supervision   General bed mobility comments: Supervision due to dizziness    Transfers Overall transfer level: Needs assistance Equipment used: Rolling walker (2 wheels) Transfers: Sit to/from Stand Sit to Stand: Supervision           General transfer comment: Good technique with hand placement, supervision due to vertigo and mild LOB upon standing      Balance Overall balance assessment: Needs assistance Sitting-balance support: No upper extremity supported, Feet supported Sitting balance-Leahy Scale: Good     Standing balance support: Bilateral upper extremity supported Standing balance-Leahy Scale: Fair                             ADL either performed or assessed with clinical judgement   ADL Overall ADL's : Needs assistance/impaired Eating/Feeding: Independent;Sitting   Grooming: Oral care;Wash/dry hands;Standing;Contact guard assist   Upper Body Bathing: Sitting;Set up   Lower Body Bathing: Sitting/lateral leans;Set up   Upper Body Dressing : Sitting;Set up   Lower Body Dressing: Supervision/safety;Sitting/lateral leans;Sit to/from stand Lower Body Dressing Details (indicate cue type and reason): donning socks, CGA for STS due to vestibular deficits Toilet Transfer: Contact guard assist;Rolling walker (2 wheels)   Toileting- Clothing Manipulation and Hygiene: Contact guard assist;Sit to/from stand       Functional mobility during ADLs: Contact guard assist;Rolling walker (2 wheels) General ADL Comments: Cues to keep her head upright and maintain a stable gaze during ambulation, head movement causes dizziness. Educated pt on gaze stabilization and provided handout/     Vision   Vision Assessment?: No apparent visual deficits Additional Comments: limited neck rotation ROM due to pain. Dizziness with changes in position and neck AROM     Perception  Praxis          Pertinent Vitals/Pain Pain Assessment Pain Assessment: Faces Faces Pain Scale: Hurts little more Pain Location: neck with rotation Pain Descriptors / Indicators: Aching, Cramping Pain Intervention(s): Monitored during session, Limited activity within patient's tolerance, Repositioned     Extremity/Trunk Assessment Upper Extremity Assessment Upper Extremity Assessment: Overall WFL for tasks assessed   Lower Extremity Assessment Lower Extremity Assessment: Generalized weakness   Cervical / Trunk Assessment Cervical / Trunk Assessment: Normal   Communication Communication Communication: No apparent difficulties   Cognition Arousal: Alert Behavior During Therapy: WFL for tasks assessed/performed Overall Cognitive Status: Within Functional Limits for tasks assessed                                       General Comments  HR in the 70s with mobility and going in/out of afib    Exercises Other Exercises Other Exercises: Gaze stabilization with head movements Other Exercises: Oculomotor pursuits and saccades   Shoulder Instructions      Home Living Family/patient expects to be discharged to:: Private residence Living Arrangements: Alone Available Help at Discharge: Available PRN/intermittently Type of Home: Independent living facility (lives at [facility]) Home Access: Level entry     Home Layout: One level     Bathroom Shower/Tub: Producer, television/film/video: Standard     Home Equipment: Shower seat - built in;Rollator (4 wheels);Cane - single Librarian, academic (2 wheels)          Prior Functioning/Environment Prior Level of Function : Independent/Modified Independent             Mobility Comments: She did not use an assistive device for ambulation inside, however used a cane when ambulating to & from the dining hall and when out in the community. ADLs Comments: She was independent with ADLs, performing light meal prep, and  driving.        OT Problem List: Impaired balance (sitting and/or standing);Other (comment) (vestibular deficits)      OT Treatment/Interventions: Self-care/ADL training;Balance training;Therapeutic exercise;Therapeutic activities;Patient/family education;Visual/perceptual remediation/compensation    OT Goals(Current goals can be found in the care plan section) Acute Rehab OT Goals Patient Stated Goal: Ready to go home OT Goal Formulation: With patient Time For Goal Achievement: 08/26/23 Potential to Achieve Goals: Good ADL Goals Pt Will Perform Grooming: with supervision;standing Pt Will Perform Lower Body Bathing: with supervision;sitting/lateral leans Pt Will Perform Lower Body Dressing: with supervision;sitting/lateral leans Pt Will Transfer to Toilet: with supervision;ambulating Pt/caregiver will Perform Home Exercise Program: With written HEP provided (for gaze stabilization)  OT Frequency: Min 1X/week    Co-evaluation              AM-PAC OT "6 Clicks" Daily Activity     Outcome Measure Help from another person eating meals?: None Help from another person taking care of personal grooming?: A Little Help from another person toileting, which includes using toliet, bedpan, or urinal?: A Little Help from another person bathing (including washing, rinsing, drying)?: A Little Help from another person to put on and taking off regular upper body clothing?: A Little Help from another person to put on and taking off regular lower body clothing?: A Little 6 Click Score: 19   End of Session Equipment Utilized During Treatment: Rolling walker (2 wheels);Gait belt Nurse Communication: Mobility status  Activity Tolerance: Patient tolerated treatment well Patient left: in bed;with  call bell/phone within reach;with bed alarm set  OT Visit Diagnosis: Unsteadiness on feet (R26.81);Other abnormalities of gait and mobility (R26.89);Dizziness and giddiness (R42)                Time:  4010-2725 OT Time Calculation (min): 44 min Charges:  OT General Charges $OT Visit: 1 Visit OT Evaluation $OT Eval Low Complexity: 1 Low OT Treatments $Self Care/Home Management : 8-22 mins $Therapeutic Activity: 8-22 mins  08/12/2023  AB, OTR/L  Acute Rehabilitation Services  Office: [phone]   Tristan Schroeder 08/12/2023, 10:21 AM

## 2023-08-12 NOTE — Plan of Care (Signed)
Problem: Education: Goal: Knowledge of General Education information will improve Description: Including pain rating scale, medication(s)/side effects and non-pharmacologic comfort measures Outcome: Progressing   Problem: Clinical Measurements: Goal: Will remain free from infection Outcome: Progressing Goal: Respiratory complications will improve Outcome: Progressing   Problem: Activity: Goal: Risk for activity intolerance will decrease Outcome: Progressing   Problem: Nutrition: Goal: Adequate nutrition will be maintained Outcome: Progressing   Problem: Elimination: Goal: Will not experience complications related to bowel motility Outcome: Progressing   Problem: Pain Managment: Goal: General experience of comfort will improve Outcome: Progressing

## 2023-08-12 NOTE — Plan of Care (Signed)
  Problem: Education: Goal: Knowledge of General Education information will improve Description: Including pain rating scale, medication(s)/side effects and non-pharmacologic comfort measures 08/12/2023 0708 by Cristal Ford, RN Outcome: Progressing 08/12/2023 0708 by Cristal Ford, RN Outcome: Progressing   Problem: Health Behavior/Discharge Planning: Goal: Ability to manage health-related needs will improve Outcome: Progressing   Problem: Clinical Measurements: Goal: Will remain free from infection 08/12/2023 0708 by Cristal Ford, RN Outcome: Progressing 08/12/2023 0708 by Cristal Ford, RN Outcome: Progressing Goal: Respiratory complications will improve Outcome: Progressing   Problem: Activity: Goal: Risk for activity intolerance will decrease Outcome: Progressing   Problem: Nutrition: Goal: Adequate nutrition will be maintained Outcome: Progressing   Problem: Elimination: Goal: Will not experience complications related to bowel motility Outcome: Progressing   Problem: Pain Managment: Goal: General experience of comfort will improve Outcome: Progressing

## 2023-08-14 ENCOUNTER — Encounter: Payer: Self-pay | Admitting: *Deleted

## 2023-08-14 ENCOUNTER — Telehealth: Payer: Self-pay | Admitting: Cardiology

## 2023-08-14 NOTE — Telephone Encounter (Signed)
Patient calling to speak with Dr. Antoine Poche or nurse in regards to medication

## 2023-08-14 NOTE — Telephone Encounter (Signed)
Patient was in hospital with diverticulitis and fluid retention. Was taking Lasix 40 mg Daily, saw Doctor last week and she stated she was to get a new RX for 20 mg. She then states she has the 40 mg tablet and was told to quarter it and take a quarter every day.  She then goes back and states she has been on a standing order of Lasix 40mg .  Per last OV  Lasix 5 mg for 2 pound weight gain overnight.  She has since gone to ED and they placed her back on Lasix 40 mg daily.  They however, sent in a Liquid Lasix and "that will not work" for her.  Please advise on what dose she needs to be taking and will call in to her pharmacy.

## 2023-08-15 ENCOUNTER — Emergency Department (HOSPITAL_COMMUNITY): Payer: Medicare Other

## 2023-08-15 ENCOUNTER — Other Ambulatory Visit: Payer: Self-pay

## 2023-08-15 ENCOUNTER — Emergency Department (HOSPITAL_COMMUNITY)
Admission: EM | Admit: 2023-08-15 | Discharge: 2023-08-16 | Disposition: A | Discharge status: Home / Self Care | Payer: Medicare Other | Attending: Emergency Medicine | Admitting: Emergency Medicine

## 2023-08-15 DIAGNOSIS — E119 Type 2 diabetes mellitus without complications: Secondary | ICD-10-CM | POA: Diagnosis not present

## 2023-08-15 DIAGNOSIS — Z7901 Long term (current) use of anticoagulants: Secondary | ICD-10-CM | POA: Diagnosis not present

## 2023-08-15 DIAGNOSIS — R0602 Shortness of breath: Secondary | ICD-10-CM | POA: Diagnosis not present

## 2023-08-15 DIAGNOSIS — R609 Edema, unspecified: Secondary | ICD-10-CM | POA: Diagnosis not present

## 2023-08-15 DIAGNOSIS — R1084 Generalized abdominal pain: Secondary | ICD-10-CM | POA: Diagnosis not present

## 2023-08-15 DIAGNOSIS — I5033 Acute on chronic diastolic (congestive) heart failure: Secondary | ICD-10-CM | POA: Diagnosis not present

## 2023-08-15 DIAGNOSIS — I110 Hypertensive heart disease with heart failure: Secondary | ICD-10-CM | POA: Diagnosis not present

## 2023-08-15 DIAGNOSIS — R600 Localized edema: Secondary | ICD-10-CM | POA: Diagnosis not present

## 2023-08-15 DIAGNOSIS — Z79899 Other long term (current) drug therapy: Secondary | ICD-10-CM | POA: Diagnosis not present

## 2023-08-15 DIAGNOSIS — I700 Atherosclerosis of aorta: Secondary | ICD-10-CM | POA: Diagnosis not present

## 2023-08-15 DIAGNOSIS — I10 Essential (primary) hypertension: Secondary | ICD-10-CM | POA: Diagnosis not present

## 2023-08-15 DIAGNOSIS — Z7984 Long term (current) use of oral hypoglycemic drugs: Secondary | ICD-10-CM | POA: Diagnosis not present

## 2023-08-15 LAB — CBC WITH DIFFERENTIAL/PLATELET
Abs Immature Granulocytes: 0.01 10*3/uL (ref 0.00–0.07)
Basophils Absolute: 0 10*3/uL (ref 0.0–0.1)
Basophils Relative: 1 %
Eosinophils Absolute: 0.2 10*3/uL (ref 0.0–0.5)
Eosinophils Relative: 3 %
HCT: 28.6 % — ABNORMAL LOW (ref 36.0–46.0)
Hemoglobin: 9.4 g/dL — ABNORMAL LOW (ref 12.0–15.0)
Immature Granulocytes: 0 %
Lymphocytes Relative: 19 %
Lymphs Abs: 0.9 10*3/uL (ref 0.7–4.0)
MCH: 35.9 pg — ABNORMAL HIGH (ref 26.0–34.0)
MCHC: 32.9 g/dL (ref 30.0–36.0)
MCV: 109.2 fL — ABNORMAL HIGH (ref 80.0–100.0)
Monocytes Absolute: 0.7 10*3/uL (ref 0.1–1.0)
Monocytes Relative: 16 %
Neutro Abs: 2.8 10*3/uL (ref 1.7–7.7)
Neutrophils Relative %: 61 %
Platelets: 281 10*3/uL (ref 150–400)
RBC: 2.62 MIL/uL — ABNORMAL LOW (ref 3.87–5.11)
RDW: 12.2 % (ref 11.5–15.5)
WBC: 4.7 10*3/uL (ref 4.0–10.5)
nRBC: 0 % (ref 0.0–0.2)

## 2023-08-15 LAB — COMPREHENSIVE METABOLIC PANEL
ALT: 15 U/L (ref 0–44)
AST: 20 U/L (ref 15–41)
Albumin: 3.2 g/dL — ABNORMAL LOW (ref 3.5–5.0)
Alkaline Phosphatase: 82 U/L (ref 38–126)
Anion gap: 14 (ref 5–15)
BUN: 27 mg/dL — ABNORMAL HIGH (ref 8–23)
CO2: 24 mmol/L (ref 22–32)
Calcium: 8.2 mg/dL — ABNORMAL LOW (ref 8.9–10.3)
Chloride: 94 mmol/L — ABNORMAL LOW (ref 98–111)
Creatinine, Ser: 1.06 mg/dL — ABNORMAL HIGH (ref 0.44–1.00)
GFR, Estimated: 50 mL/min — ABNORMAL LOW (ref >60)
Glucose, Bld: 127 mg/dL — ABNORMAL HIGH (ref 70–99)
Potassium: 3.8 mmol/L (ref 3.5–5.1)
Sodium: 132 mmol/L — ABNORMAL LOW (ref 135–145)
Total Bilirubin: 0.6 mg/dL (ref 0.3–1.2)
Total Protein: 6.2 g/dL — ABNORMAL LOW (ref 6.5–8.1)

## 2023-08-15 LAB — TROPONIN I (HIGH SENSITIVITY)
Troponin I (High Sensitivity): 13 ng/L (ref <18)
Troponin I (High Sensitivity): 13 ng/L (ref <18)

## 2023-08-15 LAB — BRAIN NATRIURETIC PEPTIDE: B Natriuretic Peptide: 301.8 pg/mL — ABNORMAL HIGH (ref 0.0–100.0)

## 2023-08-15 MED ORDER — FUROSEMIDE 20 MG PO TABS: ORAL_TABLET | ORAL | 3 refills | Status: DC

## 2023-08-15 MED ORDER — LOPERAMIDE HCL 2 MG PO CAPS
2.0000 mg | ORAL_CAPSULE | Freq: Once | ORAL | Status: AC
  Administered 2023-08-15: 2 mg via ORAL
  Filled 2023-08-15: qty 1

## 2023-08-15 MED ORDER — ONDANSETRON HCL 4 MG/2ML IJ SOLN
4.0000 mg | Freq: Once | INTRAMUSCULAR | Status: AC
  Administered 2023-08-15: 4 mg via INTRAVENOUS
  Filled 2023-08-15: qty 2

## 2023-08-15 MED ORDER — MORPHINE SULFATE (PF) 4 MG/ML IV SOLN
4.0000 mg | Freq: Once | INTRAVENOUS | Status: AC
  Administered 2023-08-15: 4 mg via INTRAVENOUS
  Filled 2023-08-15: qty 1

## 2023-08-15 MED ORDER — FUROSEMIDE 10 MG/ML IJ SOLN
20.0000 mg | Freq: Once | INTRAMUSCULAR | Status: AC
  Administered 2023-08-15: 20 mg via INTRAVENOUS
  Filled 2023-08-15: qty 2

## 2023-08-15 MED ORDER — TRAMADOL HCL 50 MG PO TABS
50.0000 mg | ORAL_TABLET | Freq: Once | ORAL | Status: AC
  Administered 2023-08-15: 50 mg via ORAL
  Filled 2023-08-15: qty 1

## 2023-08-15 NOTE — Telephone Encounter (Signed)
Left voicemail to return call to office

## 2023-08-15 NOTE — ED Notes (Signed)
[facility] called for transport  to address on file.

## 2023-08-15 NOTE — ED Triage Notes (Signed)
Patient arrives by EMS from home with c/o fluid retention.   Patient recently admitted and discharged for CHF exacerbation.   Patient reports has gained 3 lbs since being discharged on Saturday.   Swelling noted to abdomen, patient does reports some abdominal pain.   Denies CP and SOB.   EMS VITALS 112/66 61 97% RA CBG 221

## 2023-08-15 NOTE — Discharge Instructions (Addendum)
You can try Dr. Jodelle Red, Dr. Idelle Jo, Dr. Sherlynn Stalls, Dr. Anne Fu as cardiologist that you may have a better relationship.  Get your lasix from the pharmacy tomorrow but only take if your weight is up.  Your labs looked much better today.  Continue all you current medications.  The second heart test was normal.

## 2023-08-15 NOTE — ED Provider Notes (Signed)
[place] EMERGENCY DEPARTMENT AT [facility] Provider Note   CSN: [id number] Arrival date & time: 08/15/23  1358     History  Chief Complaint  Patient presents with   Edema    Heather Olsen is a 89 y.o. female.  Pt is a 89y/o female with hx of chronic HFpEF with EF of 55-60%, PAF, HTN, DM-2 who has recently been discharged from the hospital on Saturday after having elevated BNP and troponins, diverticulitis who is returning today due to weight gain and ongoing abdominal pain.  Patient has been taking 40 mg of Lasix since being home but reports that she has gained 3 pounds in the last 2 days despite taking her medications.  She is also having ongoing abdominal pain which is persistent but not much worse and feeling feverish at home despite taking the antibiotics as well as having diarrhea.  She has dyspnea on exertion but denies any dyspnea at rest or orthopnea.  No chest pain, cough that is new.  She was supposed to take a quarter of a 20 mg Lasix tablet if she gained weight but reports that the cardiology office sent her a liquid and she was waiting to get tablets which are still at the drugstore now.  The history is provided by the patient and medical records.       Home Medications Prior to Admission medications   Medication Sig Start Date End Date Taking? Authorizing Provider  acetaminophen (TYLENOL) 650 MG CR tablet Take 1,300 mg by mouth 2 (two) times daily.    [provider]  amoxicillin-clavulanate (AUGMENTIN) 875-125 MG tablet Take 1 tablet by mouth every 12 (twelve) hours for 10 days. 08/10/23 08/20/23  Lonell Grandchild, MD  Cholecalciferol (VITAMIN D3) 2000 units capsule Take 2,000 Units by mouth daily.     [provider]  denosumab (PROLIA) 60 MG/ML SOSY injection Inject 60 mg into the skin every 6 (six) months. 07/25/19   [provider]  ELIQUIS 2.5 MG TABS tablet Take 2.5 mg by mouth 2 (two) times daily. 02/03/20   [provider]  furosemide (LASIX) 20 MG tablet Take 1/4 tablet ( 5 mg ) daily if gains 2 lbs over night 08/15/23   Rollene Rotunda, MD  furosemide (LASIX) 40 MG tablet Take 1 tablet (40 mg total) by mouth daily. 08/15/23   Rollene Rotunda, MD  ipratropium (ATROVENT) 0.06 % nasal spray Use 2 sprays in each nostril up to three times daily before meals for sinus drainage. Patient taking differently: Place 1 spray into the nose daily. 09/30/16   Kimber Relic, MD  LINZESS 145 MCG CAPS capsule Take 145 mcg by mouth daily as needed (constipation). 07/29/22   [provider]  metFORMIN (GLUCOPHAGE-XR) 500 MG 24 hr tablet Take 1,000 mg by mouth every evening. 02/01/22   [provider]  mirabegron ER (MYRBETRIQ) 50 MG TB24 tablet Take 50 mg by mouth daily. 08/11/21   [provider]  MULTIPLE VITAMINS-MINERALS PO Take 1 tablet by mouth daily.     [provider]  nitroGLYCERIN (NITROSTAT) 0.4 MG SL tablet Place 1 tablet (0.4 mg total) under the tongue every 5 (five) minutes as needed for chest pain. Place one tablet under the tongue every 5 minutes as needed for chest pain. No more than 3 08/09/22   Rollene Rotunda, MD  pantoprazole (PROTONIX) 40 MG tablet Take 40 mg by mouth 2 (two) times daily. 12/18/19   [provider]  pindolol (VISKEN)  5 MG tablet Take 1/2 (one-half) tablet by mouth twice daily Patient taking differently: Take 2.5 mg by mouth 2 (two) times daily. 07/04/23   Rollene Rotunda, MD  Probiotic Product (PROBIOTIC DAILY PO) Take 1 capsule by mouth every evening.    [provider]  rOPINIRole (REQUIP) 0.5 MG tablet Take 0.5 mg by mouth at bedtime. 07/13/22   [provider]  rosuvastatin (CRESTOR) 10 MG tablet TAKE 1 TABLET BY MOUTH AT BEDTIME Patient taking differently: Take 10 mg by mouth daily. 11/23/20   Maeola Harman, MD  sertraline (ZOLOFT) 25 MG tablet Take 25 mg by mouth at bedtime. 08/10/22   [provider]   traMADol (ULTRAM) 50 MG tablet TAKE 1 TABLET BY MOUTH EVERY 4 HOURS Patient taking differently: Take 50 mg by mouth every 12 (twelve) hours as needed for moderate pain or severe pain (pain). 11/14/16   Reed, Tiffany L, DO  traZODone (DESYREL) 50 MG tablet Take 50 mg by mouth 2 (two) times daily. 08/08/23   [provider]      Allergies    Sulfasalazine, Topiramate, Ace inhibitors, Codeine, Ibandronic acid, Meloxicam, Prevacid [lansoprazole], Sulfa antibiotics, Verapamil, and Zolpidem    Review of Systems   Review of Systems  Physical Exam Updated Vital Signs BP 113/81 (BP Location: Right Arm)   Pulse 64   Temp 98.7 F (37.1 C) (Oral)   Resp 20   SpO2 94%  Physical Exam Vitals and nursing note reviewed.  Constitutional:      General: She is not in acute distress.    Appearance: She is well-developed.  HENT:     Head: Normocephalic and atraumatic.  Eyes:     Pupils: Pupils are equal, round, and reactive to light.  Cardiovascular:     Rate and Rhythm: Normal rate and regular rhythm.     Heart sounds: Normal heart sounds. No murmur heard.    No friction rub.  Pulmonary:     Effort: Pulmonary effort is normal.     Breath sounds: Normal breath sounds. No wheezing or rales.  Abdominal:     General: Bowel sounds are normal. There is no distension.     Palpations: Abdomen is soft.     Tenderness: There is no abdominal tenderness. There is no guarding or rebound.     Comments: No significant reproducible pain  Musculoskeletal:        General: No tenderness. Normal range of motion.     Right lower leg: Edema present.     Left lower leg: Edema present.     Comments: Trace pitting edema present in bilateral lower extremities  Skin:    General: Skin is warm and dry.     Findings: No rash.  Neurological:     Mental Status: She is alert and oriented to person, place, and time. Mental status is at baseline.     Cranial Nerves: No cranial nerve deficit.  Psychiatric:         Behavior: Behavior normal.     ED Results / Procedures / Treatments   Labs (all labs ordered are listed, but only abnormal results are displayed) Labs Reviewed  CBC WITH DIFFERENTIAL/PLATELET - Abnormal; Notable for the following components:      Result Value   RBC 2.62 (*)    Hemoglobin 9.4 (*)    HCT 28.6 (*)    MCV 109.2 (*)    MCH 35.9 (*)    All other components within normal limits  COMPREHENSIVE METABOLIC  PANEL - Abnormal; Notable for the following components:   Sodium 132 (*)    Chloride 94 (*)    Glucose, Bld 127 (*)    BUN 27 (*)    Creatinine, Ser 1.06 (*)    Calcium 8.2 (*)    Total Protein 6.2 (*)    Albumin 3.2 (*)    GFR, Estimated 50 (*)    All other components within normal limits  BRAIN NATRIURETIC PEPTIDE - Abnormal; Notable for the following components:   B Natriuretic Peptide 301.8 (*)    All other components within normal limits  TROPONIN I (HIGH SENSITIVITY)  TROPONIN I (HIGH SENSITIVITY)    EKG EKG Interpretation Date/Time:  Tuesday August 15 2023 16:04:29 EDT Ventricular Rate:  55 PR Interval:  343 QRS Duration:  79 QT Interval:  469 QTC Calculation: 449 R Axis:   -58  Text Interpretation: Sinus rhythm Prolonged PR interval Probable left atrial enlargement RSR' in V1 or V2, right VCD or RVH Probable LVH with secondary repol abnrm Inferior infarct, old No significant change since last tracing Confirmed by Gwyneth Sprout ([id number]) on 08/15/2023 6:24:26 PM  Radiology DG Chest Port 1 View  Result Date: 08/15/2023 CLINICAL DATA:  Shortness of breath. EXAM: PORTABLE CHEST 1 VIEW COMPARISON:  08/10/2023. FINDINGS: Bilateral lung fields are clear. Bilateral lateral costophrenic angles are clear. Stable cardio-mediastinal silhouette. Thoracic aortic vascular calcifications noted. No acute osseous abnormalities. The soft tissues are within normal limits. IMPRESSION: No active disease. Electronically Signed   By: Jules Schick M.D.   On: 08/15/2023  16:58    Procedures Procedures    Medications Ordered in ED Medications  morphine (PF) 4 MG/ML injection 4 mg (has no administration in time range)  ondansetron (ZOFRAN) injection 4 mg (has no administration in time range)  furosemide (LASIX) injection 20 mg (20 mg Intravenous Given 08/15/23 1832)  traMADol (ULTRAM) tablet 50 mg (50 mg Oral Given 08/15/23 1937)  loperamide (IMODIUM) capsule 2 mg (2 mg Oral Given 08/15/23 1938)    ED Course/ Medical Decision Making/ A&P                                 Medical Decision Making Amount and/or Complexity of Data Reviewed External Data Reviewed: notes. Labs: ordered. Decision-making details documented in ED Course. Radiology: ordered and independent interpretation performed. Decision-making details documented in ED Course. ECG/medicine tests: ordered and independent interpretation performed. Decision-making details documented in ED Course.  Risk Prescription drug management.   Pt with multiple medical problems and comorbidities and presenting today with a complaint that caries a high risk for morbidity and mortality.  Here today with persistent abdominal pain as well as weight gain and worsening shortness of breath with exertion.  Patient recently hospitalized for diverticulitis and CHF.  They did feel that she was euvolemic however BNP was elevated and she was diuresed but then transition to oral Lasix quickly into her hospital stay.  She has been compliant with her medications but reports ongoing abdominal pain with intermittent chills but her thermometer is broken so she is not sure if she is running a fever, ongoing diarrhea and now she is up 3 pounds in the last few days.  Patient is in no distress on exam.  There is some trace edema in the bilateral lower extremities.  Concern for recurrent CHF/heart strain due to persistent diverticulitis.  It does not seem that the diverticulitis is significantly worse  than when she left the hospital and  has no significant abdominal pain to suggest abscess.   9:30 PM I independently interpreted patient's labs and EKG.  EKG without acute changes today, labs with a troponin which is much improved today at 13 from the 100s when she was last here, CBC with unchanged hemoglobin in the nines and normal white count, CMP with no acute changes examined BNP today at 300 which is improved from her last 1 of 800.  I have independently visualized and interpreted pt's images today.  Chest x-ray today within normal limits.  Findings discussed with the patient.  Given her 3 pound weight gain she was given a small dose of IV Lasix and put out a liter.  At this time do not feel that patient has criteria for admission and feels that she can go home.  She will pick up the prescription for extra Lasix to use when her weight goes up.  She is comfortable with this plan.         Final Clinical Impression(s) / ED Diagnoses Final diagnoses:  Peripheral edema    Rx / DC Orders ED Discharge Orders     None         Gwyneth Sprout, MD 08/15/23 2130

## 2023-08-15 NOTE — Telephone Encounter (Signed)
Spoke to patient she stated she has been having a hard time getting Lasix 20 mg refilled.Stated she has been taking Lasix 40 mg daily and she is suppose to take 20 mg 1/4 tablet if she gains 2 lbs over night.Stated her sob is worse today and she is weaker.She does not have any swelling in lower legs.She has gained 2 lbs over night.Spoke to [employer] at [place] [facility] [address].Stated Lasix 10 mg solution is on back order.Advised to cancel solution and fill Lasix 20 mg tablet take 1/4 daily if gains 2 lbs over night.She will have prescription ready for pick up in 1 hour. Patient stated she is not happy with the care she has been receiving.Stated she will be going to [facility] ED to be evaluated.Advised I will make Dr.Hochrein aware.

## 2023-08-15 NOTE — Telephone Encounter (Signed)
Pt returning nurses phone call. Please advise ?

## 2023-08-18 DIAGNOSIS — I2510 Atherosclerotic heart disease of native coronary artery without angina pectoris: Secondary | ICD-10-CM | POA: Diagnosis not present

## 2023-08-18 DIAGNOSIS — I509 Heart failure, unspecified: Secondary | ICD-10-CM | POA: Diagnosis not present

## 2023-08-18 DIAGNOSIS — R55 Syncope and collapse: Secondary | ICD-10-CM | POA: Diagnosis not present

## 2023-08-18 DIAGNOSIS — R413 Other amnesia: Secondary | ICD-10-CM | POA: Diagnosis not present

## 2023-08-18 DIAGNOSIS — R002 Palpitations: Secondary | ICD-10-CM | POA: Diagnosis not present

## 2023-08-18 DIAGNOSIS — I10 Essential (primary) hypertension: Secondary | ICD-10-CM | POA: Diagnosis not present

## 2023-08-22 DIAGNOSIS — M47812 Spondylosis without myelopathy or radiculopathy, cervical region: Secondary | ICD-10-CM | POA: Diagnosis not present

## 2023-08-23 DIAGNOSIS — N393 Stress incontinence (female) (male): Secondary | ICD-10-CM | POA: Diagnosis not present

## 2023-08-23 DIAGNOSIS — Z7901 Long term (current) use of anticoagulants: Secondary | ICD-10-CM | POA: Diagnosis not present

## 2023-08-23 DIAGNOSIS — Z955 Presence of coronary angioplasty implant and graft: Secondary | ICD-10-CM | POA: Diagnosis not present

## 2023-08-23 DIAGNOSIS — I509 Heart failure, unspecified: Secondary | ICD-10-CM | POA: Diagnosis not present

## 2023-08-23 DIAGNOSIS — Z23 Encounter for immunization: Secondary | ICD-10-CM | POA: Diagnosis not present

## 2023-08-23 DIAGNOSIS — I10 Essential (primary) hypertension: Secondary | ICD-10-CM | POA: Diagnosis not present

## 2023-08-31 ENCOUNTER — Emergency Department (HOSPITAL_BASED_OUTPATIENT_CLINIC_OR_DEPARTMENT_OTHER)
Admission: EM | Admit: 2023-08-31 | Discharge: 2023-08-31 | Disposition: A | Discharge status: Home / Self Care | Payer: Medicare Other | Attending: Emergency Medicine | Admitting: Emergency Medicine

## 2023-08-31 ENCOUNTER — Encounter (HOSPITAL_BASED_OUTPATIENT_CLINIC_OR_DEPARTMENT_OTHER): Payer: Self-pay | Admitting: Emergency Medicine

## 2023-08-31 ENCOUNTER — Other Ambulatory Visit: Payer: Self-pay

## 2023-08-31 DIAGNOSIS — H5713 Ocular pain, bilateral: Secondary | ICD-10-CM | POA: Insufficient documentation

## 2023-08-31 DIAGNOSIS — H26492 Other secondary cataract, left eye: Secondary | ICD-10-CM | POA: Diagnosis not present

## 2023-08-31 DIAGNOSIS — G8918 Other acute postprocedural pain: Secondary | ICD-10-CM | POA: Diagnosis not present

## 2023-08-31 DIAGNOSIS — H26491 Other secondary cataract, right eye: Secondary | ICD-10-CM | POA: Diagnosis not present

## 2023-08-31 MED ORDER — OXYCODONE-ACETAMINOPHEN 5-325 MG PO TABS
1.0000 | ORAL_TABLET | Freq: Once | ORAL | Status: AC
  Administered 2023-08-31: 1 via ORAL
  Filled 2023-08-31: qty 1

## 2023-08-31 NOTE — ED Notes (Signed)
Reviewed AVS with patient and son, both expressed understanding of directions, deny further questions at this time.

## 2023-08-31 NOTE — ED Notes (Addendum)
Called ophthalmology for consult Dr Zenaida Niece is on call [phone] @10 :05 Called and answer machine said if its a calback press 4 then thank you good bye Called another number on amion [phone]

## 2023-08-31 NOTE — ED Provider Notes (Signed)
[place] EMERGENCY DEPARTMENT AT [facility] Provider Note   CSN: [id number] Arrival date & time: 08/31/23  2119     History  Chief Complaint  Patient presents with   Eye Pain    Heather Olsen is a 89 y.o. female.  89 year old female presents with bilateral eye pain.  Patient states that she had corneal polishing today done around 430.  States that around 730 she started having photophobia and bilateral eye pain.  Denies any fever.  Has had increased tearing.  Attempted to call her ophthalmologist without success       Home Medications Prior to Admission medications   Medication Sig Start Date End Date Taking? Authorizing Provider  acetaminophen (TYLENOL) 650 MG CR tablet Take 1,300 mg by mouth 2 (two) times daily.    [provider]  Cholecalciferol (VITAMIN D3) 2000 units capsule Take 2,000 Units by mouth daily.     [provider]  denosumab (PROLIA) 60 MG/ML SOSY injection Inject 60 mg into the skin every 6 (six) months. 07/25/19   [provider]  ELIQUIS 2.5 MG TABS tablet Take 2.5 mg by mouth 2 (two) times daily. 02/03/20   [provider]  furosemide (LASIX) 20 MG tablet Take 1/4 tablet ( 5 mg ) daily if gains 2 lbs over night 08/15/23   Rollene Rotunda, MD  furosemide (LASIX) 40 MG tablet Take 1 tablet (40 mg total) by mouth daily. 08/15/23   Rollene Rotunda, MD  ipratropium (ATROVENT) 0.06 % nasal spray Use 2 sprays in each nostril up to three times daily before meals for sinus drainage. Patient taking differently: Place 1 spray into the nose daily. 09/30/16   Kimber Relic, MD  LINZESS 145 MCG CAPS capsule Take 145 mcg by mouth daily as needed (constipation). 07/29/22   [provider]  metFORMIN (GLUCOPHAGE-XR) 500 MG 24 hr tablet Take 1,000 mg by mouth every evening. 02/01/22   [provider]  mirabegron ER (MYRBETRIQ) 50 MG TB24 tablet Take 50 mg by mouth daily. 08/11/21   [provider]  MULTIPLE  VITAMINS-MINERALS PO Take 1 tablet by mouth daily.     [provider]  nitroGLYCERIN (NITROSTAT) 0.4 MG SL tablet Place 1 tablet (0.4 mg total) under the tongue every 5 (five) minutes as needed for chest pain. Place one tablet under the tongue every 5 minutes as needed for chest pain. No more than 3 08/09/22   Rollene Rotunda, MD  pantoprazole (PROTONIX) 40 MG tablet Take 40 mg by mouth 2 (two) times daily. 12/18/19   [provider]  pindolol (VISKEN) 5 MG tablet Take 1/2 (one-half) tablet by mouth twice daily Patient taking differently: Take 2.5 mg by mouth 2 (two) times daily. 07/04/23   Rollene Rotunda, MD  Probiotic Product (PROBIOTIC DAILY PO) Take 1 capsule by mouth every evening.    [provider]  rOPINIRole (REQUIP) 0.5 MG tablet Take 0.5 mg by mouth at bedtime. 07/13/22   [provider]  rosuvastatin (CRESTOR) 10 MG tablet TAKE 1 TABLET BY MOUTH AT BEDTIME Patient taking differently: Take 10 mg by mouth daily. 11/23/20   Maeola Harman, MD  sertraline (ZOLOFT) 25 MG tablet Take 25 mg by mouth at bedtime. 08/10/22   [provider]  traMADol (ULTRAM) 50 MG tablet TAKE 1 TABLET BY MOUTH EVERY 4 HOURS Patient taking differently: Take 50 mg by mouth every 12 (twelve) hours as needed for moderate pain or severe pain (pain). 11/14/16   Bufford Spikes  L, DO  traZODone (DESYREL) 50 MG tablet Take 50 mg by mouth 2 (two) times daily. 08/08/23   [provider]      Allergies    Sulfasalazine, Topiramate, Ace inhibitors, Codeine, Ibandronic acid, Meloxicam, Prevacid [lansoprazole], Sulfa antibiotics, Verapamil, and Zolpidem    Review of Systems   Review of Systems  All other systems reviewed and are negative.   Physical Exam Updated Vital Signs BP (!) 166/94 (BP Location: Left Arm)   Pulse 62   Temp (!) 97.3 F (36.3 C)   Resp 19   Ht 1.575 m (5\' 2" )   Wt 54 kg   SpO2 99%   BMI 21.77 kg/m  Physical Exam Vitals and nursing  note reviewed.  Constitutional:      General: She is not in acute distress.    Appearance: Normal appearance. She is well-developed. She is not toxic-appearing.  HENT:     Head: Normocephalic and atraumatic.  Eyes:     General: Lids are normal.        Right eye: No foreign body.        Left eye: No foreign body.     Extraocular Movements: Extraocular movements intact.     Conjunctiva/sclera: Conjunctivae normal.     Right eye: Right conjunctiva is not injected.     Left eye: Left conjunctiva is not injected.     Pupils: Pupils are equal, round, and reactive to light.     Comments: Patient's eyes had to be pried open.  Neck:     Thyroid: No thyroid mass.     Trachea: No tracheal deviation.  Cardiovascular:     Rate and Rhythm: Normal rate and regular rhythm.     Heart sounds: Normal heart sounds. No murmur heard.    No gallop.  Pulmonary:     Effort: Pulmonary effort is normal. No respiratory distress.     Breath sounds: Normal breath sounds. No stridor. No decreased breath sounds, wheezing, rhonchi or rales.  Abdominal:     General: There is no distension.     Palpations: Abdomen is soft.     Tenderness: There is no abdominal tenderness. There is no rebound.  Musculoskeletal:        General: No tenderness. Normal range of motion.     Cervical back: Normal range of motion and neck supple.  Skin:    General: Skin is warm and dry.     Findings: No abrasion or rash.  Neurological:     Mental Status: She is alert and oriented to person, place, and time. Mental status is at baseline.     GCS: GCS eye subscore is 4. GCS verbal subscore is 5. GCS motor subscore is 6.     Cranial Nerves: Cranial nerves are intact. No cranial nerve deficit.     Sensory: No sensory deficit.     Motor: Motor function is intact.  Psychiatric:        Attention and Perception: Attention normal.        Speech: Speech normal.        Behavior: Behavior normal.     ED Results / Procedures / Treatments    Labs (all labs ordered are listed, but only abnormal results are displayed) Labs Reviewed - No data to display  EKG None  Radiology No results found.  Procedures Procedures    Medications Ordered in ED Medications  oxyCODONE-acetaminophen (PERCOCET/ROXICET) 5-325 MG per tablet 1 tablet (1 tablet Oral Given 08/31/23 2205)  ED Course/ Medical Decision Making/ A&P                                 Medical Decision Making Risk Prescription drug management.   Patient given Percocet here for pain.  Discussed case with Dr. Orvan Falconer, on-call for ophthalmology, states that patient needs to see her ophthalmologist tomorrow morning.  No further intervention noted        Final Clinical Impression(s) / ED Diagnoses Final diagnoses:  None    Rx / DC Orders ED Discharge Orders     None         Lorre Nick, MD 08/31/23 2310

## 2023-08-31 NOTE — ED Triage Notes (Signed)
   Patient comes in with bilateral eye pain that has been going on since 1900.  Patient states she had laser procedure performed on bilateral eyes earlier this afternoon around 1615.  Patient attempted to call office and on call number but was unable to get a call back.  Pain 10/10, burning.  Patient unsure of procedure but states it was done to "polish her lens".

## 2023-08-31 NOTE — Discharge Instructions (Signed)
Follow-up with your eye doctor tomorrow morning 8:00

## 2023-08-31 NOTE — ED Notes (Signed)
Call for consult with ophthalmology called the wrong on call dr.  Was working on tomorrow's sheet and called Dr Zenaida Niece who is for tomorrow   Tonight is Dr Vonna Kotyk  At [phone] @10 :35pm

## 2023-09-04 DIAGNOSIS — R002 Palpitations: Secondary | ICD-10-CM | POA: Diagnosis not present

## 2023-09-06 DIAGNOSIS — R002 Palpitations: Secondary | ICD-10-CM | POA: Diagnosis not present

## 2023-09-11 ENCOUNTER — Encounter (HOSPITAL_BASED_OUTPATIENT_CLINIC_OR_DEPARTMENT_OTHER): Payer: Self-pay

## 2023-09-11 ENCOUNTER — Inpatient Hospital Stay (HOSPITAL_BASED_OUTPATIENT_CLINIC_OR_DEPARTMENT_OTHER)
Admission: EM | Admit: 2023-09-11 | Discharge: 2023-09-13 | DRG: 291 | Disposition: A | Discharge status: Home / Self Care | Payer: Medicare Other | Attending: Internal Medicine | Admitting: Internal Medicine

## 2023-09-11 ENCOUNTER — Other Ambulatory Visit (HOSPITAL_BASED_OUTPATIENT_CLINIC_OR_DEPARTMENT_OTHER): Payer: Self-pay

## 2023-09-11 ENCOUNTER — Other Ambulatory Visit: Payer: Self-pay

## 2023-09-11 ENCOUNTER — Emergency Department (HOSPITAL_BASED_OUTPATIENT_CLINIC_OR_DEPARTMENT_OTHER): Payer: Medicare Other | Admitting: Radiology

## 2023-09-11 DIAGNOSIS — J9811 Atelectasis: Secondary | ICD-10-CM | POA: Diagnosis present

## 2023-09-11 DIAGNOSIS — M542 Cervicalgia: Secondary | ICD-10-CM | POA: Diagnosis not present

## 2023-09-11 DIAGNOSIS — G8929 Other chronic pain: Secondary | ICD-10-CM | POA: Diagnosis present

## 2023-09-11 DIAGNOSIS — I3139 Other pericardial effusion (noninflammatory): Secondary | ICD-10-CM | POA: Diagnosis present

## 2023-09-11 DIAGNOSIS — I5033 Acute on chronic diastolic (congestive) heart failure: Secondary | ICD-10-CM | POA: Diagnosis not present

## 2023-09-11 DIAGNOSIS — Z961 Presence of intraocular lens: Secondary | ICD-10-CM | POA: Diagnosis present

## 2023-09-11 DIAGNOSIS — M4120 Other idiopathic scoliosis, site unspecified: Secondary | ICD-10-CM | POA: Diagnosis present

## 2023-09-11 DIAGNOSIS — R0902 Hypoxemia: Secondary | ICD-10-CM | POA: Diagnosis not present

## 2023-09-11 DIAGNOSIS — K219 Gastro-esophageal reflux disease without esophagitis: Secondary | ICD-10-CM | POA: Diagnosis present

## 2023-09-11 DIAGNOSIS — I959 Hypotension, unspecified: Secondary | ICD-10-CM | POA: Diagnosis not present

## 2023-09-11 DIAGNOSIS — Z886 Allergy status to analgesic agent status: Secondary | ICD-10-CM

## 2023-09-11 DIAGNOSIS — I440 Atrioventricular block, first degree: Secondary | ICD-10-CM | POA: Diagnosis present

## 2023-09-11 DIAGNOSIS — I509 Heart failure, unspecified: Principal | ICD-10-CM

## 2023-09-11 DIAGNOSIS — F419 Anxiety disorder, unspecified: Secondary | ICD-10-CM | POA: Diagnosis present

## 2023-09-11 DIAGNOSIS — E119 Type 2 diabetes mellitus without complications: Secondary | ICD-10-CM | POA: Diagnosis not present

## 2023-09-11 DIAGNOSIS — Z8659 Personal history of other mental and behavioral disorders: Secondary | ICD-10-CM

## 2023-09-11 DIAGNOSIS — H9190 Unspecified hearing loss, unspecified ear: Secondary | ICD-10-CM | POA: Diagnosis present

## 2023-09-11 DIAGNOSIS — M810 Age-related osteoporosis without current pathological fracture: Secondary | ICD-10-CM | POA: Diagnosis present

## 2023-09-11 DIAGNOSIS — E785 Hyperlipidemia, unspecified: Secondary | ICD-10-CM | POA: Diagnosis not present

## 2023-09-11 DIAGNOSIS — N3281 Overactive bladder: Secondary | ICD-10-CM | POA: Diagnosis present

## 2023-09-11 DIAGNOSIS — Z79899 Other long term (current) drug therapy: Secondary | ICD-10-CM

## 2023-09-11 DIAGNOSIS — I517 Cardiomegaly: Secondary | ICD-10-CM | POA: Diagnosis not present

## 2023-09-11 DIAGNOSIS — Z8249 Family history of ischemic heart disease and other diseases of the circulatory system: Secondary | ICD-10-CM

## 2023-09-11 DIAGNOSIS — I272 Pulmonary hypertension, unspecified: Secondary | ICD-10-CM | POA: Diagnosis not present

## 2023-09-11 DIAGNOSIS — Z9842 Cataract extraction status, left eye: Secondary | ICD-10-CM | POA: Diagnosis not present

## 2023-09-11 DIAGNOSIS — F32A Depression, unspecified: Secondary | ICD-10-CM | POA: Diagnosis not present

## 2023-09-11 DIAGNOSIS — Z9841 Cataract extraction status, right eye: Secondary | ICD-10-CM | POA: Diagnosis not present

## 2023-09-11 DIAGNOSIS — Z888 Allergy status to other drugs, medicaments and biological substances status: Secondary | ICD-10-CM

## 2023-09-11 DIAGNOSIS — Z885 Allergy status to narcotic agent status: Secondary | ICD-10-CM

## 2023-09-11 DIAGNOSIS — M549 Dorsalgia, unspecified: Secondary | ICD-10-CM | POA: Diagnosis present

## 2023-09-11 DIAGNOSIS — R42 Dizziness and giddiness: Secondary | ICD-10-CM | POA: Diagnosis not present

## 2023-09-11 DIAGNOSIS — I480 Paroxysmal atrial fibrillation: Secondary | ICD-10-CM | POA: Diagnosis present

## 2023-09-11 DIAGNOSIS — Z7901 Long term (current) use of anticoagulants: Secondary | ICD-10-CM

## 2023-09-11 DIAGNOSIS — I10 Essential (primary) hypertension: Secondary | ICD-10-CM

## 2023-09-11 DIAGNOSIS — M7989 Other specified soft tissue disorders: Secondary | ICD-10-CM | POA: Diagnosis present

## 2023-09-11 DIAGNOSIS — I2510 Atherosclerotic heart disease of native coronary artery without angina pectoris: Secondary | ICD-10-CM | POA: Diagnosis not present

## 2023-09-11 DIAGNOSIS — I110 Hypertensive heart disease with heart failure: Secondary | ICD-10-CM | POA: Diagnosis not present

## 2023-09-11 DIAGNOSIS — G2581 Restless legs syndrome: Secondary | ICD-10-CM | POA: Diagnosis present

## 2023-09-11 DIAGNOSIS — I7300 Raynaud's syndrome without gangrene: Secondary | ICD-10-CM | POA: Diagnosis present

## 2023-09-11 DIAGNOSIS — Z882 Allergy status to sulfonamides status: Secondary | ICD-10-CM

## 2023-09-11 DIAGNOSIS — Z7984 Long term (current) use of oral hypoglycemic drugs: Secondary | ICD-10-CM

## 2023-09-11 LAB — CBC WITH DIFFERENTIAL/PLATELET
Abs Immature Granulocytes: 0.02 10*3/uL (ref 0.00–0.07)
Basophils Absolute: 0.1 10*3/uL (ref 0.0–0.1)
Basophils Relative: 1 %
Eosinophils Absolute: 0.1 10*3/uL (ref 0.0–0.5)
Eosinophils Relative: 2 %
HCT: 29.5 % — ABNORMAL LOW (ref 36.0–46.0)
Hemoglobin: 10.2 g/dL — ABNORMAL LOW (ref 12.0–15.0)
Immature Granulocytes: 0 %
Lymphocytes Relative: 13 %
Lymphs Abs: 0.9 10*3/uL (ref 0.7–4.0)
MCH: 35.9 pg — ABNORMAL HIGH (ref 26.0–34.0)
MCHC: 34.6 g/dL (ref 30.0–36.0)
MCV: 103.9 fL — ABNORMAL HIGH (ref 80.0–100.0)
Monocytes Absolute: 0.8 10*3/uL (ref 0.1–1.0)
Monocytes Relative: 12 %
Neutro Abs: 4.8 10*3/uL (ref 1.7–7.7)
Neutrophils Relative %: 72 %
Platelets: 248 10*3/uL (ref 150–400)
RBC: 2.84 MIL/uL — ABNORMAL LOW (ref 3.87–5.11)
RDW: 12.3 % (ref 11.5–15.5)
WBC: 6.7 10*3/uL (ref 4.0–10.5)
nRBC: 0 % (ref 0.0–0.2)

## 2023-09-11 LAB — BASIC METABOLIC PANEL
Anion gap: 9 (ref 5–15)
BUN: 43 mg/dL — ABNORMAL HIGH (ref 8–23)
CO2: 33 mmol/L — ABNORMAL HIGH (ref 22–32)
Calcium: 9.5 mg/dL (ref 8.9–10.3)
Chloride: 88 mmol/L — ABNORMAL LOW (ref 98–111)
Creatinine, Ser: 1.07 mg/dL — ABNORMAL HIGH (ref 0.44–1.00)
GFR, Estimated: 49 mL/min — ABNORMAL LOW (ref >60)
Glucose, Bld: 176 mg/dL — ABNORMAL HIGH (ref 70–99)
Potassium: 3.9 mmol/L (ref 3.5–5.1)
Sodium: 130 mmol/L — ABNORMAL LOW (ref 135–145)

## 2023-09-11 LAB — BRAIN NATRIURETIC PEPTIDE: B Natriuretic Peptide: 638.2 pg/mL — ABNORMAL HIGH (ref 0.0–100.0)

## 2023-09-11 LAB — GLUCOSE, CAPILLARY: Glucose-Capillary: 127 mg/dL — ABNORMAL HIGH (ref 70–99)

## 2023-09-11 MED ORDER — INSULIN ASPART 100 UNIT/ML IJ SOLN: 0.0000 [IU] | Freq: Every day | INTRAMUSCULAR | Status: DC

## 2023-09-11 MED ORDER — INSULIN ASPART 100 UNIT/ML IJ SOLN
0.0000 [IU] | Freq: Three times a day (TID) | INTRAMUSCULAR | Status: DC
  Administered 2023-09-12: 1 [IU] via SUBCUTANEOUS
  Administered 2023-09-12: 2 [IU] via SUBCUTANEOUS
  Administered 2023-09-13: 1 [IU] via SUBCUTANEOUS

## 2023-09-11 MED ORDER — ACETAMINOPHEN 500 MG PO TABS
500.0000 mg | ORAL_TABLET | Freq: Once | ORAL | Status: AC
  Administered 2023-09-11: 500 mg via ORAL
  Filled 2023-09-11: qty 1

## 2023-09-11 MED ORDER — FUROSEMIDE 10 MG/ML IJ SOLN
20.0000 mg | Freq: Once | INTRAMUSCULAR | Status: AC
  Administered 2023-09-11: 20 mg via INTRAVENOUS
  Filled 2023-09-11: qty 2

## 2023-09-11 MED ORDER — FUROSEMIDE 10 MG/ML IJ SOLN: 20.0000 mg | Freq: Two times a day (BID) | INTRAMUSCULAR | Status: DC

## 2023-09-11 MED ORDER — ACETAMINOPHEN 325 MG PO TABS
650.0000 mg | ORAL_TABLET | Freq: Four times a day (QID) | ORAL | Status: DC | PRN
  Administered 2023-09-11 – 2023-09-12 (×2): 650 mg via ORAL
  Filled 2023-09-11 (×3): qty 2

## 2023-09-11 NOTE — ED Notes (Signed)
Attempted to call report..secretary reported they are in shift change now, asked me to call back

## 2023-09-11 NOTE — ED Notes (Signed)
Ambulated patient with one RN and one NT to obtain a walking O2 saturation on patient. Patient's O2 dropped to 86% but patient denies shortness of breath. Patient did become tachypneic with ambulation at 24RR. Patient c/o mild chest discomfort once repositioned back in bed. Provider notified.

## 2023-09-11 NOTE — ED Provider Notes (Signed)
[place] EMERGENCY DEPARTMENT AT [facility] Provider Note   CSN: [id number] Arrival date & time: 09/11/23  1320     History  Chief Complaint  Patient presents with   Dizziness    Heather Olsen is a 89 y.o. female hx of chronic HFpEF with EF of 55-60%, PAF, HTN, DM-2 presented with leg swelling that occurred this morning along with an episode of dizziness.  Patient states that she takes 40 mg of Lasix daily however this morning when she looked at her legs her legs appeared more swollen than they did yesterday and that she reports a 3 pound weight increase since yesterday.  Patient took 5 extra milligrams of her Lasix and then afterwards tried to stand up and became dizzy.  This dizziness was her feeling unstable however this only lasted for a few moments as patient sat back down and her dizziness ultimately resolved.  Patient is not had any dizziness since then.  Patient denies chest pain, shortness of breath, fevers, changes sensation/motor skills, skin color changes, abdominal pain, nausea/vomiting, productive cough, headache, tinnitus.  Home Medications Prior to Admission medications   Medication Sig Start Date End Date Taking? Authorizing Provider  acetaminophen (TYLENOL) 650 MG CR tablet Take 1,300 mg by mouth 2 (two) times daily.    [provider]  Cholecalciferol (VITAMIN D3) 2000 units capsule Take 2,000 Units by mouth daily.     [provider]  denosumab (PROLIA) 60 MG/ML SOSY injection Inject 60 mg into the skin every 6 (six) months. 07/25/19   [provider]  ELIQUIS 2.5 MG TABS tablet Take 2.5 mg by mouth 2 (two) times daily. 02/03/20   [provider]  furosemide (LASIX) 20 MG tablet Take 1/4 tablet ( 5 mg ) daily if gains 2 lbs over night 08/15/23   Rollene Rotunda, MD  furosemide (LASIX) 40 MG tablet Take 1 tablet (40 mg total) by mouth daily. 08/15/23   Rollene Rotunda, MD  ipratropium (ATROVENT) 0.06 % nasal spray Use 2 sprays in  each nostril up to three times daily before meals for sinus drainage. Patient taking differently: Place 1 spray into the nose daily. 09/30/16   Kimber Relic, MD  LINZESS 145 MCG CAPS capsule Take 145 mcg by mouth daily as needed (constipation). 07/29/22   [provider]  metFORMIN (GLUCOPHAGE-XR) 500 MG 24 hr tablet Take 1,000 mg by mouth every evening. 02/01/22   [provider]  mirabegron ER (MYRBETRIQ) 50 MG TB24 tablet Take 50 mg by mouth daily. 08/11/21   [provider]  MULTIPLE VITAMINS-MINERALS PO Take 1 tablet by mouth daily.     [provider]  nitroGLYCERIN (NITROSTAT) 0.4 MG SL tablet Place 1 tablet (0.4 mg total) under the tongue every 5 (five) minutes as needed for chest pain. Place one tablet under the tongue every 5 minutes as needed for chest pain. No more than 3 08/09/22   Rollene Rotunda, MD  pantoprazole (PROTONIX) 40 MG tablet Take 40 mg by mouth 2 (two) times daily. 12/18/19   [provider]  pindolol (VISKEN) 5 MG tablet Take 1/2 (one-half) tablet by mouth twice daily Patient taking differently: Take 2.5 mg by mouth 2 (two) times daily. 07/04/23   Rollene Rotunda, MD  Probiotic Product (PROBIOTIC DAILY PO) Take 1 capsule by mouth every evening.    [provider]  rOPINIRole (REQUIP) 0.5 MG tablet Take 0.5 mg by mouth at bedtime. 07/13/22   [provider]  rosuvastatin (CRESTOR) 10  MG tablet TAKE 1 TABLET BY MOUTH AT BEDTIME Patient taking differently: Take 10 mg by mouth daily. 11/23/20   Maeola Harman, MD  sertraline (ZOLOFT) 25 MG tablet Take 25 mg by mouth at bedtime. 08/10/22   [provider]  traMADol (ULTRAM) 50 MG tablet TAKE 1 TABLET BY MOUTH EVERY 4 HOURS Patient taking differently: Take 50 mg by mouth every 12 (twelve) hours as needed for moderate pain or severe pain (pain). 11/14/16   Reed, Tiffany L, DO  traZODone (DESYREL) 50 MG tablet Take 50 mg by mouth 2 (two) times daily.  08/08/23   [provider]      Allergies    Sulfasalazine, Topiramate, Ace inhibitors, Codeine, Ibandronic acid, Meloxicam, Prevacid [lansoprazole], Sulfa antibiotics, Verapamil, and Zolpidem    Review of Systems   Review of Systems  Neurological:  Positive for dizziness.    Physical Exam Updated Vital Signs BP 128/64   Pulse 63   Temp 98 F (36.7 C) (Oral)   Resp (!) 23   Ht 5\' 2"  (1.575 m)   Wt 50.8 kg   SpO2 92%   BMI 20.49 kg/m  Physical Exam Vitals reviewed.  Constitutional:      General: She is not in acute distress. HENT:     Head: Normocephalic and atraumatic.  Eyes:     Extraocular Movements: Extraocular movements intact.     Conjunctiva/sclera: Conjunctivae normal.     Pupils: Pupils are equal, round, and reactive to light.  Cardiovascular:     Rate and Rhythm: Normal rate and regular rhythm.     Pulses: Normal pulses.     Heart sounds: Normal heart sounds.     Comments: 2+ bilateral radial/dorsalis pedis pulses with regular rate Pulmonary:     Effort: Pulmonary effort is normal. No respiratory distress.     Breath sounds: Normal breath sounds.  Abdominal:     Palpations: Abdomen is soft.     Tenderness: There is no abdominal tenderness. There is no guarding or rebound.  Musculoskeletal:        General: Normal range of motion.     Cervical back: Normal range of motion and neck supple.     Comments: 5 out of 5 bilateral grip/leg extension strength Bilateral 1+ pitting edema  Skin:    General: Skin is warm and dry.     Capillary Refill: Capillary refill takes less than 2 seconds.  Neurological:     General: No focal deficit present.     Mental Status: She is alert and oriented to person, place, and time.     Comments: Sensation intact in all 4 limbs Vision grossly intact Cranial nerves III through XII intact  Psychiatric:        Mood and Affect: Mood normal.     ED Results / Procedures / Treatments   Labs (all labs ordered are listed,  but only abnormal results are displayed) Labs Reviewed  BASIC METABOLIC PANEL - Abnormal; Notable for the following components:      Result Value   Sodium 130 (*)    Chloride 88 (*)    CO2 33 (*)    Glucose, Bld 176 (*)    BUN 43 (*)    Creatinine, Ser 1.07 (*)    GFR, Estimated 49 (*)    All other components within normal limits  CBC WITH DIFFERENTIAL/PLATELET - Abnormal; Notable for the following components:   RBC 2.84 (*)    Hemoglobin 10.2 (*)    HCT  29.5 (*)    MCV 103.9 (*)    MCH 35.9 (*)    All other components within normal limits  BRAIN NATRIURETIC PEPTIDE - Abnormal; Notable for the following components:   B Natriuretic Peptide 638.2 (*)    All other components within normal limits    EKG EKG Interpretation Date/Time:  Monday September 11 2023 13:25:42 EDT Ventricular Rate:  62 PR Interval:  301 QRS Duration:  87 QT Interval:  455 QTC Calculation: 463 R Axis:   -51  Text Interpretation: Sinus rhythm Atrial premature complexes in couplets Prolonged PR interval Probable left atrial enlargement Left anterior fascicular block RSR' in V1 or V2, probably normal variant Probable left ventricular hypertrophy Confirmed by Benjiman Core [phone]) on 09/11/2023 6:09:39 PM  Radiology No results found.  Procedures Procedures    Medications Ordered in ED Medications  furosemide (LASIX) injection 20 mg (20 mg Intravenous Given 09/11/23 1515)    ED Course/ Medical Decision Making/ A&P                                 Medical Decision Making Amount and/or Complexity of Data Reviewed Labs: ordered. Radiology: ordered.  Risk Prescription drug management. Decision regarding hospitalization.   Fia Mctavish 89 y.o. presented today for dizziness. Working DDx that I considered at this time includes, but not limited to, CHF exacerbation, respiratory hypoxia, CVA/TIA, arrhythmia, CNS lesion, BPPV, vestibular labyrinthitis, Meniere's, Ramsey-Hunt, polypharmacy, orthostatic  hypotension, electrolyte abnormalities.  R/o DDx: CVA/TIA, arrhythmia, CNS lesion, BPPV, vestibular labyrinthitis, Meniere's, Ramsey-Hunt, polypharmacy, electrolyte abnormalities: These are considered less likely due to history of present illness, physical exam, lab/imaging findings  Review of prior external notes: 08/15/2023 ED  Unique Tests and My Interpretation:  EKG: Sinus 60 bpm, first-degree heart block, left fascicular block, no ST elevation or depressions noted, similar to previous EKGs Be NP: 638.2, elevated from previous reading CBC: Unremarkable BMP: Similar to previous reading  Discussion with Independent Historian: None  Discussion of Management of Tests:  Alinda Money, MD hospitalist  Risk: Medium: prescription drug management and High: hospitalization or escalation of hospital-level care  Risk Stratification Score: none  Staffed with Karene Fry, MD  Plan: On exam patient was no acute distress stable vitals.  On exam patient does have bilateral 1+ pitting edema without concerning features in her legs.  Patient physical exam outside of the lung neurologic exam was reassuring.  Patient states that she took 5 extra milligrams of her Lasix this morning and then tried to stand up after taking that which caused her to be slightly dizzy and this resolved after sitting down.  This is suggestive of orthostatic as patient probably had lower BP than normal causing her to become orthostatic when she stood up however will obtain basic labs along with BNP due to patient's leg swelling.  Patient denied chest pain or other symptoms associated with her chief concern today and so troponins were not ordered.  Patient seems to be orthostatic in nature and so CT head was not ordered after discussion with the attending.  Labs were at baseline for patient currently awaiting BNP.  BNP elevated. Will give 20mg  Lasix and get CXR as patient does have O2 in the low 90s however is not complaining about shortness  of breath.  While patient was going to the bathroom she noted to the nurse that she became short of breath.  Nurse ambulated the patient and patient's oxygen dropped to 86%.  Patient is not requiring any oxygen at this time is her O2 is come back up while lying in the bed however I spoke to the patient and with her hypoxia when ambulating feel that she will need to be admitted as this most likely secondary to her CHF as she may need more IV Lasix.  Patient is in agreements with admission.  Will consult hospitalist for admission.  I spoke to the hospitalist and patient was accepted for admission.  Patient stable for admission at this time.  This chart was dictated using voice recognition software.  Despite best efforts to proofread,  errors can occur which can change the documentation meaning.         Final Clinical Impression(s) / ED Diagnoses Final diagnoses:  Acute on chronic congestive heart failure, unspecified heart failure type [name])  Hypoxia    Rx / DC Orders ED Discharge Orders     None         Remi Deter 09/11/23 Carlis Stable    Ernie Avena, MD 09/12/23 1621

## 2023-09-11 NOTE — H&P (Signed)
History and Physical    [name] AVW:[id number] DOB: [birth year] DOA: 09/11/2023  PCP: Merri Brunette, MD  Patient coming from: [facility] ED  Chief Complaint: Swelling of legs  HPI: Heather Olsen is a 89 y.o. female with medical history significant of CAD, chronic HFpEF, paroxysmal A-fib on Eliquis, hypertension, type 2 diabetes, pulmonary hypertension, CKD stage II, hyperlipidemia, GERD, chronic back pain, overactive bladder, anxiety, depression, vertigo, first-degree AV block, RLS, anemia, diverticulosis, hearing loss presented to the ED with complaints of bilateral lower extremity edema and dizziness after taking extra Lasix which resolved after sitting down.  Desatted to 86% with ambulation in the ED.  Afebrile.  Labs notable for WBC 6.7, hemoglobin 10.2 (stable), sodium 130 (no significant change from baseline), chloride 88, bicarb 33, glucose 176, creatinine 1.0, BNP 638. Chest x-ray showing cardiomegaly and minimal bibasilar atelectasis.  Patient was given Tylenol and IV Lasix 20 mg.  Patient seen and examined at bedside.  Resting comfortably, eating applesauce and crackers.  She reports taking Lasix 40 mg daily for her CHF.  Yesterday she started noticing bilateral lower extremity edema.  She weighed herself and noticed that she had gained 1 pound.  She took 1/4 of extra Lasix pill (5 mg) yesterday after which she felt dizzy when going from sitting to standing position.  Dizziness has now resolved.  She is not consuming too many fluids and also denies excess dietary sodium intake.  Endorsing dyspnea with exertion and orthopnea.  No chest pain.  No other complaints.  Review of Systems:  Review of Systems  All other systems reviewed and are negative.   Past Medical History:  Diagnosis Date   Aortic stenosis 04/12/2016   none noted on echo done 08/13/20   Coronary artery disease    Depression    Diverticulosis    Hearing loss 04/12/2016   Hiatal hernia 04/12/2016   HLD (hyperlipidemia)  04/12/2016   Hyperglycemia 09/14/2016   Hypertension    Idiopathic scoliosis 04/12/2016   Major depression 04/12/2016   Mitral regurgitation    Osteoporosis, senile 04/12/2016   Pernicious anemia    Raynaud's disease 04/12/2016   Scoliosis    Spinal stenosis of lumbar region 04/12/2016    Past Surgical History:  Procedure Laterality Date   APPENDECTOMY     BREAST SURGERY     broken wrist Right 2007   CARPAL TUNNEL RELEASE Left 04/04/2017   Procedure: LEFT CARPAL TUNNEL RELEASE;  Surgeon: Cindee Salt, MD;  Location: [facility];  Service: Orthopedics;  Laterality: Left;  REG/FAB   CARPAL TUNNEL RELEASE Right 12/01/2020   Procedure: CARPAL TUNNEL RELEASE;  Surgeon: Cindee Salt, MD;  Location: [facility];  Service: Orthopedics;  Laterality: Right;  IV REGIONAL FOREARM BLOCK   CATARACT EXTRACTION W/ INTRAOCULAR LENS  IMPLANT, BILATERAL Bilateral 2017   COLONOSCOPY  2016   FOOT SURGERY Right    PERIPHERAL VASCULAR INTERVENTION  11/11/2019   Procedure: PERIPHERAL VASCULAR INTERVENTION;  Surgeon: Maeola Harman, MD;  Location: [name] INVASIVE CV LAB;  Service: Cardiovascular;;   TONSILLECTOMY     VISCERAL ANGIOGRAPHY N/A 11/11/2019   Procedure: VISCERAL ANGIOGRAPHY;  Surgeon: Maeola Harman, MD;  Location: [facility] INVASIVE CV LAB;  Service: Cardiovascular;  Laterality: N/A;     reports that she has never smoked. She has never been exposed to tobacco smoke. She has never used smokeless tobacco. She reports current alcohol use. She reports that she does not use drugs.  Allergies  Allergen Reactions  Sulfasalazine Other (See Comments)    Other reaction(s): Other (See Comments), headaches, headaches, headaches   Topiramate Rash   Ace Inhibitors Cough   Codeine Nausea Only   Ibandronic Acid Nausea Only   Meloxicam     Other reaction(s): GI upset, GI Upset (intolerance), Other (See Comments) unknown    Prevacid [Lansoprazole]     Chest pain   Sulfa  Antibiotics Other (See Comments)    headaches   Verapamil Other (See Comments)    constipation   Zolpidem Other (See Comments)    hallucinations    Family History  Problem Relation Age of Onset   Heart disease Mother        Died 67, MR, no CAD   Heart disease Father        CHF, no CAD.[name]    HIV Daughter 93       from bone tissue transplant    Prior to Admission medications   Medication Sig Start Date End Date Taking? Authorizing Provider  acetaminophen (TYLENOL) 650 MG CR tablet Take 1,300 mg by mouth 2 (two) times daily.    [provider]  Cholecalciferol (VITAMIN D3) 2000 units capsule Take 2,000 Units by mouth daily.     [provider]  denosumab (PROLIA) 60 MG/ML SOSY injection Inject 60 mg into the skin every 6 (six) months. 07/25/19   [provider]  ELIQUIS 2.5 MG TABS tablet Take 2.5 mg by mouth 2 (two) times daily. 02/03/20   [provider]  furosemide (LASIX) 20 MG tablet Take 1/4 tablet ( 5 mg ) daily if gains 2 lbs over night 08/15/23   Rollene Rotunda, MD  furosemide (LASIX) 40 MG tablet Take 1 tablet (40 mg total) by mouth daily. 08/15/23   Rollene Rotunda, MD  ipratropium (ATROVENT) 0.06 % nasal spray Use 2 sprays in each nostril up to three times daily before meals for sinus drainage. Patient taking differently: Place 1 spray into the nose daily. 09/30/16   Kimber Relic, MD  LINZESS 145 MCG CAPS capsule Take 145 mcg by mouth daily as needed (constipation). 07/29/22   [provider]  metFORMIN (GLUCOPHAGE-XR) 500 MG 24 hr tablet Take 1,000 mg by mouth every evening. 02/01/22   [provider]  mirabegron ER (MYRBETRIQ) 50 MG TB24 tablet Take 50 mg by mouth daily. 08/11/21   [provider]  MULTIPLE VITAMINS-MINERALS PO Take 1 tablet by mouth daily.     [provider]  nitroGLYCERIN (NITROSTAT) 0.4 MG SL tablet Place 1 tablet (0.4 mg total) under the tongue every 5 (five) minutes as needed for chest  pain. Place one tablet under the tongue every 5 minutes as needed for chest pain. No more than 3 08/09/22   Rollene Rotunda, MD  pantoprazole (PROTONIX) 40 MG tablet Take 40 mg by mouth 2 (two) times daily. 12/18/19   [provider]  pindolol (VISKEN) 5 MG tablet Take 1/2 (one-half) tablet by mouth twice daily Patient taking differently: Take 2.5 mg by mouth 2 (two) times daily. 07/04/23   Rollene Rotunda, MD  Probiotic Product (PROBIOTIC DAILY PO) Take 1 capsule by mouth every evening.    [provider]  rOPINIRole (REQUIP) 0.5 MG tablet Take 0.5 mg by mouth at bedtime. 07/13/22   [provider]  rosuvastatin (CRESTOR) 10 MG tablet TAKE 1 TABLET BY MOUTH AT BEDTIME Patient taking differently: Take 10 mg by mouth daily. 11/23/20   Maeola Harman, MD  sertraline (ZOLOFT) 25 MG  tablet Take 25 mg by mouth at bedtime. 08/10/22   [provider]  traMADol (ULTRAM) 50 MG tablet TAKE 1 TABLET BY MOUTH EVERY 4 HOURS Patient taking differently: Take 50 mg by mouth every 12 (twelve) hours as needed for moderate pain or severe pain (pain). 11/14/16   Reed, Tiffany L, DO  traZODone (DESYREL) 50 MG tablet Take 50 mg by mouth 2 (two) times daily. 08/08/23   [provider]    Physical Exam: Vitals:   09/11/23 1830 09/11/23 1845 09/11/23 1900 09/11/23 1915  BP: (!) 140/73  (!) 146/78   Pulse: 71 72 67 71  Resp: (!) 23 (!) 22 19 19   Temp:      TempSrc:      SpO2: 95% 99% 96% 95%  Weight:      Height:        Physical Exam Vitals reviewed.  Constitutional:      General: She is not in acute distress. HENT:     Head: Normocephalic and atraumatic.  Eyes:     Extraocular Movements: Extraocular movements intact.  Cardiovascular:     Rate and Rhythm: Normal rate and regular rhythm.     Pulses: Normal pulses.  Pulmonary:     Effort: Pulmonary effort is normal. No respiratory distress.     Breath sounds: No wheezing or rales.  Abdominal:      General: Bowel sounds are normal. There is no distension.     Palpations: Abdomen is soft.     Tenderness: There is no abdominal tenderness.  Musculoskeletal:     Cervical back: Normal range of motion.     Right lower leg: Edema present.     Left lower leg: Edema present.     Comments: 1-2+ pitting edema of bilateral lower legs  Skin:    General: Skin is warm and dry.  Neurological:     General: No focal deficit present.     Mental Status: She is alert and oriented to person, place, and time.     Labs on Admission: I have personally reviewed following labs and imaging studies  CBC: Recent Labs  Lab 09/11/23 1412  WBC 6.7  NEUTROABS 4.8  HGB 10.2*  HCT 29.5*  MCV 103.9*  PLT 248   Basic Metabolic Panel: Recent Labs  Lab 09/11/23 1412  NA 130*  K 3.9  CL 88*  CO2 33*  GLUCOSE 176*  BUN 43*  CREATININE 1.07*  CALCIUM 9.5   GFR: Estimated Creatinine Clearance: 27.6 mL/min (A) (by C-G formula based on SCr of 1.07 mg/dL (H)). Liver Function Tests: No results for input(s): "AST", "ALT", "ALKPHOS", "BILITOT", "PROT", "ALBUMIN" in the last 168 hours. No results for input(s): "LIPASE", "AMYLASE" in the last 168 hours. No results for input(s): "AMMONIA" in the last 168 hours. Coagulation Profile: No results for input(s): "INR", "PROTIME" in the last 168 hours. Cardiac Enzymes: No results for input(s): "CKTOTAL", "CKMB", "CKMBINDEX", "TROPONINI" in the last 168 hours. BNP (last 3 results) No results for input(s): "PROBNP" in the last 8760 hours. HbA1C: No results for input(s): "HGBA1C" in the last 72 hours. CBG: No results for input(s): "GLUCAP" in the last 168 hours. Lipid Profile: No results for input(s): "CHOL", "HDL", "LDLCALC", "TRIG", "CHOLHDL", "LDLDIRECT" in the last 72 hours. Thyroid Function Tests: No results for input(s): "TSH", "T4TOTAL", "FREET4", "T3FREE", "THYROIDAB" in the last 72 hours. Anemia Panel: No results for input(s): "VITAMINB12", "FOLATE",  "FERRITIN", "TIBC", "IRON", "RETICCTPCT" in the last 72 hours. Urine analysis:  Component Value Date/Time   COLORURINE YELLOW 08/10/2023 1720   APPEARANCEUR CLEAR 08/10/2023 1720   LABSPEC 1.017 08/10/2023 1720   PHURINE 5.5 08/10/2023 1720   GLUCOSEU 250 (A) 08/10/2023 1720   HGBUR NEGATIVE 08/10/2023 1720   BILIRUBINUR NEGATIVE 08/10/2023 1720   KETONESUR NEGATIVE 08/10/2023 1720   PROTEINUR NEGATIVE 08/10/2023 1720   NITRITE NEGATIVE 08/10/2023 1720   LEUKOCYTESUR SMALL (A) 08/10/2023 1720    Radiological Exams on Admission: DG Chest 2 View  Result Date: 09/11/2023 CLINICAL DATA:  Elevated BNP. EXAM: CHEST - 2 VIEW COMPARISON:  Chest x-ray 08/15/2023 FINDINGS: The heart is enlarged. There is minimal bibasilar atelectasis. The lungs are otherwise clear. There is no pleural effusion or pneumothorax. There is dextroconvex scoliosis of the thoracic spine. IMPRESSION: Cardiomegaly. Minimal bibasilar atelectasis. Electronically Signed   By: Darliss Cheney M.D.   On: 09/11/2023 19:15    EKG: Independently reviewed.  Sinus rhythm with first-degree AV block, nonspecific T wave abnormalities.  No significant change since previous tracing.  Assessment and Plan  Acute on chronic HFpEF Echo done 07/27/2023 showing EF 55 to 60%, grade 2 diastolic dysfunction, normal RV systolic function, moderately elevated pulmonary artery systolic pressure, small pericardial effusion, mild mitral regurgitation, mild to moderate tricuspid regurgitation.  Patient presenting with complaints of bilateral lower extremity edema, weight gain, dyspnea on exertion, and orthopnea.  Not hypoxic at rest but desatted to 86% with ambulation.  BNP 638.  Chest x-ray not suggestive of pulmonary edema.  Does have peripheral edema on exam.  Creatinine stable.  Patient was given IV Lasix 20 mg in the ED at 3:15 PM.  Blood pressure remains stable.  Will give additional IV Lasix 20 mg x 1 tonight.  Reassess in the morning and order  additional doses of Lasix accordingly.  Monitor intake and output, daily weights, and BMP.  Low-sodium diet with fluid restriction.  Supplemental oxygen as needed.  Non-insulin-dependent type 2 diabetes A1c 7.5 on 07/27/2023.  Sensitive sliding scale insulin ACHS.  CAD: Not endorsing chest pain and EKG without acute ischemic changes. Paroxysmal A-fib: Continue Eliquis after pharmacy med rec is done. Hypertension: Stable. Hyperlipidemia Overactive bladder Anxiety and depression RLS Pharmacy med rec pending.  DVT prophylaxis: Continue Eliquis after pharmacy med rec is done. Code Status: Full Code (discussed with the patient) Level of care: Telemetry bed Admission status: It is my clinical opinion that referral for OBSERVATION is reasonable and necessary in this patient based on the above information provided. The aforementioned taken together are felt to place the patient at high risk for further clinical deterioration. However, it is anticipated that the patient may be medically stable for discharge from the hospital within 24 to 48 hours.  John Giovanni MD [facility]  If 7PM-7AM, please contact night-coverage [email or web address]  09/11/2023, 8:12 PM

## 2023-09-11 NOTE — ED Notes (Signed)
Patient reports increased shortness of breath when standing and shifting to bedside commode. O2 saturation remained WNL. Provider notified.

## 2023-09-11 NOTE — Plan of Care (Signed)

## 2023-09-11 NOTE — ED Notes (Signed)
Pts 02 dropped with movement during the orthostatics. Lowest was 87%.

## 2023-09-11 NOTE — ED Triage Notes (Signed)
Pt bib [facility] from [facility] reporting dizziness with movement that began last night. Pt has been seen multiple times in the past for same, took meclizine this AM with no improvement. Also reporting increased leg swelling, took PRN lasix this AM as well, denies SOB

## 2023-09-12 ENCOUNTER — Other Ambulatory Visit (HOSPITAL_COMMUNITY): Payer: Self-pay

## 2023-09-12 ENCOUNTER — Encounter (HOSPITAL_COMMUNITY): Payer: Self-pay | Admitting: Internal Medicine

## 2023-09-12 DIAGNOSIS — I480 Paroxysmal atrial fibrillation: Secondary | ICD-10-CM | POA: Diagnosis not present

## 2023-09-12 DIAGNOSIS — Z79899 Other long term (current) drug therapy: Secondary | ICD-10-CM | POA: Diagnosis not present

## 2023-09-12 DIAGNOSIS — I5033 Acute on chronic diastolic (congestive) heart failure: Secondary | ICD-10-CM | POA: Diagnosis not present

## 2023-09-12 DIAGNOSIS — I272 Pulmonary hypertension, unspecified: Secondary | ICD-10-CM | POA: Diagnosis not present

## 2023-09-12 DIAGNOSIS — M7989 Other specified soft tissue disorders: Secondary | ICD-10-CM | POA: Diagnosis present

## 2023-09-12 DIAGNOSIS — E119 Type 2 diabetes mellitus without complications: Secondary | ICD-10-CM | POA: Diagnosis not present

## 2023-09-12 DIAGNOSIS — Z9842 Cataract extraction status, left eye: Secondary | ICD-10-CM | POA: Diagnosis not present

## 2023-09-12 DIAGNOSIS — Z8249 Family history of ischemic heart disease and other diseases of the circulatory system: Secondary | ICD-10-CM | POA: Diagnosis not present

## 2023-09-12 DIAGNOSIS — I2510 Atherosclerotic heart disease of native coronary artery without angina pectoris: Secondary | ICD-10-CM | POA: Diagnosis not present

## 2023-09-12 DIAGNOSIS — R0902 Hypoxemia: Secondary | ICD-10-CM | POA: Diagnosis present

## 2023-09-12 DIAGNOSIS — Z961 Presence of intraocular lens: Secondary | ICD-10-CM | POA: Diagnosis present

## 2023-09-12 DIAGNOSIS — J9811 Atelectasis: Secondary | ICD-10-CM | POA: Diagnosis not present

## 2023-09-12 DIAGNOSIS — F32A Depression, unspecified: Secondary | ICD-10-CM | POA: Diagnosis not present

## 2023-09-12 DIAGNOSIS — N3281 Overactive bladder: Secondary | ICD-10-CM | POA: Diagnosis present

## 2023-09-12 DIAGNOSIS — F419 Anxiety disorder, unspecified: Secondary | ICD-10-CM | POA: Diagnosis present

## 2023-09-12 DIAGNOSIS — Z9841 Cataract extraction status, right eye: Secondary | ICD-10-CM | POA: Diagnosis not present

## 2023-09-12 DIAGNOSIS — H9190 Unspecified hearing loss, unspecified ear: Secondary | ICD-10-CM | POA: Diagnosis present

## 2023-09-12 DIAGNOSIS — E785 Hyperlipidemia, unspecified: Secondary | ICD-10-CM | POA: Diagnosis not present

## 2023-09-12 DIAGNOSIS — I3139 Other pericardial effusion (noninflammatory): Secondary | ICD-10-CM | POA: Diagnosis not present

## 2023-09-12 DIAGNOSIS — M810 Age-related osteoporosis without current pathological fracture: Secondary | ICD-10-CM | POA: Diagnosis present

## 2023-09-12 DIAGNOSIS — I7300 Raynaud's syndrome without gangrene: Secondary | ICD-10-CM | POA: Diagnosis present

## 2023-09-12 DIAGNOSIS — I110 Hypertensive heart disease with heart failure: Secondary | ICD-10-CM | POA: Diagnosis not present

## 2023-09-12 DIAGNOSIS — M4120 Other idiopathic scoliosis, site unspecified: Secondary | ICD-10-CM | POA: Diagnosis present

## 2023-09-12 DIAGNOSIS — G2581 Restless legs syndrome: Secondary | ICD-10-CM | POA: Diagnosis not present

## 2023-09-12 DIAGNOSIS — G8929 Other chronic pain: Secondary | ICD-10-CM | POA: Diagnosis present

## 2023-09-12 LAB — GLUCOSE, CAPILLARY
Glucose-Capillary: 144 mg/dL — ABNORMAL HIGH (ref 70–99)
Glucose-Capillary: 175 mg/dL — ABNORMAL HIGH (ref 70–99)
Glucose-Capillary: 138 mg/dL — ABNORMAL HIGH (ref 70–99)
Glucose-Capillary: 190 mg/dL — ABNORMAL HIGH (ref 70–99)

## 2023-09-12 LAB — BASIC METABOLIC PANEL
Anion gap: 16 — ABNORMAL HIGH (ref 5–15)
BUN: 32 mg/dL — ABNORMAL HIGH (ref 8–23)
CO2: 24 mmol/L (ref 22–32)
Calcium: 8.6 mg/dL — ABNORMAL LOW (ref 8.9–10.3)
Chloride: 90 mmol/L — ABNORMAL LOW (ref 98–111)
Creatinine, Ser: 1.02 mg/dL — ABNORMAL HIGH (ref 0.44–1.00)
GFR, Estimated: 52 mL/min — ABNORMAL LOW (ref >60)
Glucose, Bld: 215 mg/dL — ABNORMAL HIGH (ref 70–99)
Potassium: 3.4 mmol/L — ABNORMAL LOW (ref 3.5–5.1)
Sodium: 130 mmol/L — ABNORMAL LOW (ref 135–145)

## 2023-09-12 MED ORDER — APIXABAN 2.5 MG PO TABS
2.5000 mg | ORAL_TABLET | Freq: Two times a day (BID) | ORAL | Status: DC
  Administered 2023-09-12 – 2023-09-13 (×3): 2.5 mg via ORAL
  Filled 2023-09-12 (×3): qty 1

## 2023-09-12 MED ORDER — SERTRALINE HCL 50 MG PO TABS
50.0000 mg | ORAL_TABLET | Freq: Every day | ORAL | Status: DC
  Administered 2023-09-12 – 2023-09-13 (×2): 50 mg via ORAL
  Filled 2023-09-12 (×2): qty 1

## 2023-09-12 MED ORDER — POTASSIUM CHLORIDE CRYS ER 20 MEQ PO TBCR
40.0000 meq | EXTENDED_RELEASE_TABLET | Freq: Once | ORAL | Status: AC
  Administered 2023-09-12: 40 meq via ORAL
  Filled 2023-09-12: qty 2

## 2023-09-12 MED ORDER — FUROSEMIDE 10 MG/ML IJ SOLN
20.0000 mg | Freq: Once | INTRAMUSCULAR | Status: AC
  Administered 2023-09-12: 20 mg via INTRAVENOUS
  Filled 2023-09-12: qty 2

## 2023-09-12 MED ORDER — MIRABEGRON ER 50 MG PO TB24
50.0000 mg | ORAL_TABLET | Freq: Every day | ORAL | Status: DC
  Administered 2023-09-12 – 2023-09-13 (×2): 50 mg via ORAL
  Filled 2023-09-12 (×2): qty 1

## 2023-09-12 MED ORDER — TRAZODONE HCL 50 MG PO TABS
50.0000 mg | ORAL_TABLET | Freq: Every day | ORAL | Status: DC
  Administered 2023-09-12: 50 mg via ORAL
  Filled 2023-09-12: qty 1

## 2023-09-12 MED ORDER — TRAMADOL HCL 50 MG PO TABS
50.0000 mg | ORAL_TABLET | Freq: Four times a day (QID) | ORAL | Status: DC | PRN
  Administered 2023-09-12: 50 mg via ORAL
  Filled 2023-09-12: qty 1

## 2023-09-12 MED ORDER — PINDOLOL 5 MG PO TABS
2.5000 mg | ORAL_TABLET | Freq: Two times a day (BID) | ORAL | Status: DC
  Administered 2023-09-12 – 2023-09-13 (×3): 2.5 mg via ORAL
  Filled 2023-09-12 (×3): qty 1

## 2023-09-12 MED ORDER — PANTOPRAZOLE SODIUM 40 MG PO TBEC
40.0000 mg | DELAYED_RELEASE_TABLET | Freq: Every day | ORAL | Status: DC
  Administered 2023-09-12: 40 mg via ORAL
  Filled 2023-09-12: qty 1

## 2023-09-12 MED ORDER — POTASSIUM CHLORIDE CRYS ER 20 MEQ PO TBCR
40.0000 meq | EXTENDED_RELEASE_TABLET | Freq: Every day | ORAL | Status: DC
  Filled 2023-09-12: qty 2

## 2023-09-12 MED ORDER — EMPAGLIFLOZIN 10 MG PO TABS
10.0000 mg | ORAL_TABLET | Freq: Every day | ORAL | Status: DC
  Administered 2023-09-12 – 2023-09-13 (×2): 10 mg via ORAL
  Filled 2023-09-12 (×2): qty 1

## 2023-09-12 MED ORDER — ROPINIROLE HCL 1 MG PO TABS
0.5000 mg | ORAL_TABLET | Freq: Every day | ORAL | Status: DC
  Administered 2023-09-12: 0.5 mg via ORAL
  Filled 2023-09-12: qty 1

## 2023-09-12 MED ORDER — HYDROCODONE-ACETAMINOPHEN 5-325 MG PO TABS
1.0000 | ORAL_TABLET | Freq: Four times a day (QID) | ORAL | Status: DC | PRN
  Administered 2023-09-12: 1 via ORAL
  Filled 2023-09-12: qty 1

## 2023-09-12 MED ORDER — FUROSEMIDE 10 MG/ML IJ SOLN: 20.0000 mg | Freq: Two times a day (BID) | INTRAMUSCULAR | Status: DC

## 2023-09-12 NOTE — Plan of Care (Signed)

## 2023-09-12 NOTE — Progress Notes (Signed)
PROGRESS NOTE    Heather Olsen  ZOX:[id number] DOB: Jun 23, 1931 DOA: 09/11/2023 PCP: Merri Brunette, MD  89/F with history of CAD, diastolic CHF, pulmonary hypertension, paroxysmal A-fib on Eliquis, chronic back pain, overactive bladder, anxiety, depression, chronic anemia presented to the ED with some shortness of breath for few days, lower extremity edema and some dizziness, became hypoxic on ambulation in the ED, labs noted hemoglobin 10.2, creatinine 1.0, BNP 638, chest x-ray noted cardiomegaly and bibasilar atelectasis  Subjective: -Feels better, breathing is improving, diuresed over 2 L yesterday  Assessment and Plan:  Acute on chronic HFpEF Moderate pulmonary hypertension -Echo 8/24 with EF 55%, grade 2 DD, normal RV, moderately elevated PA systolic pressures -Admitted with mild volume overload, repeat IV Lasix today, add Jardiance -Multiple recent hospitalizations reviewed, educated on low-salt diet, she is at the mercy of [facility]   Non-insulin-dependent type 2 diabetes A1c 7.5 on 07/27/2023.  Sensitive sliding scale insulin ACHS. -Starting Jardiance, metformin on hold   CAD: Not endorsing chest pain and EKG without acute ischemic changes. -Restarted pindolol  Paroxysmal A-fib: Continue Eliquis and pindolol  Hypertension: Stable.  Overactive bladder: Resume Myrbetriq  Anxiety and depression RLS -Resume Zoloft, tramadol, Requip    DVT prophylaxis: Eliquis Code Status: Full code Family Communication: None present Disposition Plan: Home tomorrow if stable  Consultants:    Procedures:   Antimicrobials:    Objective: Vitals:   09/12/23 0408 09/12/23 0409 09/12/23 0736 09/12/23 1054  BP:  (!) 124/53 (!) 152/84 (!) 156/77  Pulse: 67 67 84 74  Resp:  18 18 20   Temp:  98.8 F (37.1 C) 98.4 F (36.9 C) 98.5 F (36.9 C)  TempSrc:  Oral Oral Oral  SpO2: 94% 96% 93% 96%  Weight:      Height:        Intake/Output  Summary (Last 24 hours) at 09/12/2023 1204 Last data filed at 09/12/2023 0831 Gross per 24 hour  Intake 237 ml  Output 2400 ml  Net -2163 ml   Filed Weights   09/11/23 1329 09/12/23 0115  Weight: 50.8 kg 48.4 kg    Examination:  General exam: Appears calm and comfortable HEENT: Positive JVD Respiratory system: Few basilar Rales Cardiovascular system: S1 & S2 heard, RRR.  Abd: nondistended, soft and nontender.Normal bowel sounds heard. Central nervous system: Alert and oriented. No focal neurological deficits. Extremities: no edema Skin: No rashes Psychiatry:  Mood & affect appropriate.     Data Reviewed:   CBC: Recent Labs  Lab 09/11/23 1412  WBC 6.7  NEUTROABS 4.8  HGB 10.2*  HCT 29.5*  MCV 103.9*  PLT 248   Basic Metabolic Panel: Recent Labs  Lab 09/11/23 1412 09/12/23 0352  NA 130* 130*  K 3.9 3.4*  CL 88* 90*  CO2 33* 24  GLUCOSE 176* 215*  BUN 43* 32*  CREATININE 1.07* 1.02*  CALCIUM 9.5 8.6*   GFR: Estimated Creatinine Clearance: 28 mL/min (A) (by C-G formula based on SCr of 1.02 mg/dL (H)). Liver Function Tests: No results for input(s): "AST", "ALT", "ALKPHOS", "BILITOT", "PROT", "ALBUMIN" in the last 168 hours. No results for input(s): "LIPASE", "AMYLASE" in the last 168 hours. No results for input(s): "AMMONIA" in the last 168 hours. Coagulation Profile: No results for input(s): "INR", "PROTIME" in the last 168 hours. Cardiac Enzymes: No results for input(s): "CKTOTAL", "CKMB", "CKMBINDEX", "TROPONINI" in the last 168 hours. BNP (last 3 results) No results for input(s): "PROBNP" in the last 8760 hours.  HbA1C: No results for input(s): "HGBA1C" in the last 72 hours. CBG: Recent Labs  Lab 09/11/23 2100 09/12/23 0613 09/12/23 1046  GLUCAP 127* 144* 175*   Lipid Profile: No results for input(s): "CHOL", "HDL", "LDLCALC", "TRIG", "CHOLHDL", "LDLDIRECT" in the last 72 hours. Thyroid Function Tests: No results for input(s): "TSH",  "T4TOTAL", "FREET4", "T3FREE", "THYROIDAB" in the last 72 hours. Anemia Panel: No results for input(s): "VITAMINB12", "FOLATE", "FERRITIN", "TIBC", "IRON", "RETICCTPCT" in the last 72 hours. Urine analysis:    Component Value Date/Time   COLORURINE YELLOW 08/10/2023 1720   APPEARANCEUR CLEAR 08/10/2023 1720   LABSPEC 1.017 08/10/2023 1720   PHURINE 5.5 08/10/2023 1720   GLUCOSEU 250 (A) 08/10/2023 1720   HGBUR NEGATIVE 08/10/2023 1720   BILIRUBINUR NEGATIVE 08/10/2023 1720   KETONESUR NEGATIVE 08/10/2023 1720   PROTEINUR NEGATIVE 08/10/2023 1720   NITRITE NEGATIVE 08/10/2023 1720   LEUKOCYTESUR SMALL (A) 08/10/2023 1720   Sepsis Labs: @LABRCNTIP (procalcitonin:4,lacticidven:4)  )No results found for this or any previous visit (from the past 240 hour(s)).   Radiology Studies: DG Chest 2 View  Result Date: 09/11/2023 CLINICAL DATA:  Elevated BNP. EXAM: CHEST - 2 VIEW COMPARISON:  Chest x-ray 08/15/2023 FINDINGS: The heart is enlarged. There is minimal bibasilar atelectasis. The lungs are otherwise clear. There is no pleural effusion or pneumothorax. There is dextroconvex scoliosis of the thoracic spine. IMPRESSION: Cardiomegaly. Minimal bibasilar atelectasis. Electronically Signed   By: Darliss Cheney M.D.   On: 09/11/2023 19:15     Scheduled Meds:  apixaban  2.5 mg Oral BID   empagliflozin  10 mg Oral Daily   insulin aspart  0-5 Units Subcutaneous QHS   insulin aspart  0-9 Units Subcutaneous TID WC   potassium chloride  40 mEq Oral Daily   Continuous Infusions:   LOS: 0 days    Time spent:    Zannie Cove, MD [facility]   09/12/2023, 12:04 PM

## 2023-09-12 NOTE — Care Management Obs Status (Signed)
MEDICARE OBSERVATION STATUS NOTIFICATION   Patient Details  Name: Heather Olsen MRN: [id number] Date of Birth: Sep 19, 1931   Medicare Observation Status Notification Given:  Yes    Lawerance Sabal, RN 09/12/2023, 8:56 AM

## 2023-09-12 NOTE — Progress Notes (Signed)
   Heart Failure Stewardship Pharmacist Progress Note   PCP: Merri Brunette, MD PCP-Cardiologist: Rollene Rotunda, MD    HPI:  89 yo F with PMH of CHF, CAD, T2DM, pulmonary HTN, afib, chronic back pain, overactive bladder, anxiety, depression, and chronic anemia.  She presented to the ED on 10/14 with shortness of breath, LE edema, 3 lb weight gain in 1 day, and dizziness with taking increased dose of lasix. CXR with cardiomegaly and bibasilar atelectasis. BNP 638. Last ECHO 07/27/23 with EF 55-60%, G2DD, RV normal, mild MR.   Current HF Medications: SGLT2i: Jardiance 10 mg daily  Prior to admission HF Medications: Diuretic: furosemide 40 mg daily  Pertinent Lab Values: Serum creatinine 1.02, BUN 32, Potassium 3.4, Sodium 130, BNP 638.2, A1c 7.5  Vital Signs: Weight: 106 lbs (admission weight: 112 lbs) Blood pressure: 150/70s  Heart rate: 60-80s  I/O: net -2.4L yesterday; net -3.3L since admission  Medication Assistance / Insurance Benefits Check: Does the patient have prescription insurance?  Yes Type of insurance plan: Medicare A/B + AARP supplement  Outpatient Pharmacy:  Prior to [facility] pharmacy: [facility] Is the patient willing to use [facility] TOC pharmacy at discharge? Yes Is the patient willing to transition their outpatient pharmacy to utilize a [facility] outpatient pharmacy?   Pending    Assessment: 1. Acute on chronic diastolic CHF (LVEF 55-60%). NYHA class II symptoms. - Volume status improved, off IV lasix. Strict I/Os and daily weights. Keep K>4. KCl 40 mEq x 1 given for replacement, another 40 mEq ordered.  - GDMT limited by CKD. eGFR <30. - Agree with starting Jardiance 10 mg daily   Plan: 1) Medication changes recommended at this time: - Agree with changes  2) Patient assistance: - Jardiance copay $0  3)  Education  - To be completed prior to discharge  Sharen Hones, PharmD, BCPS Heart Failure Stewardship Pharmacist Phone [phone]

## 2023-09-12 NOTE — Plan of Care (Signed)
Problem: Clinical Measurements: Goal: Ability to maintain clinical measurements within normal limits will improve Outcome: Progressing Goal: Will remain free from infection Outcome: Progressing   Problem: Nutrition: Goal: Adequate nutrition will be maintained Outcome: Progressing   Problem: Safety: Goal: Ability to remain free from injury will improve Outcome: Progressing
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
## 2023-09-13 ENCOUNTER — Other Ambulatory Visit (HOSPITAL_COMMUNITY): Payer: Self-pay

## 2023-09-13 DIAGNOSIS — I5033 Acute on chronic diastolic (congestive) heart failure: Secondary | ICD-10-CM | POA: Diagnosis not present

## 2023-09-13 LAB — BASIC METABOLIC PANEL
Anion gap: 12 (ref 5–15)
BUN: 16 mg/dL (ref 8–23)
CO2: 26 mmol/L (ref 22–32)
Calcium: 9.2 mg/dL (ref 8.9–10.3)
Chloride: 94 mmol/L — ABNORMAL LOW (ref 98–111)
Creatinine, Ser: 0.88 mg/dL (ref 0.44–1.00)
GFR, Estimated: >60 mL/min (ref >60)
Glucose, Bld: 166 mg/dL — ABNORMAL HIGH (ref 70–99)
Potassium: 3.3 mmol/L — ABNORMAL LOW (ref 3.5–5.1)
Sodium: 132 mmol/L — ABNORMAL LOW (ref 135–145)

## 2023-09-13 LAB — GLUCOSE, CAPILLARY: Glucose-Capillary: 132 mg/dL — ABNORMAL HIGH (ref 70–99)

## 2023-09-13 MED ORDER — POTASSIUM CHLORIDE CRYS ER 20 MEQ PO TBCR
20.0000 meq | EXTENDED_RELEASE_TABLET | Freq: Every day | ORAL | 0 refills | Status: AC
  Filled 2023-09-13: qty 30, 30d supply, fill #0

## 2023-09-13 MED ORDER — POTASSIUM CHLORIDE CRYS ER 20 MEQ PO TBCR
40.0000 meq | EXTENDED_RELEASE_TABLET | Freq: Two times a day (BID) | ORAL | Status: DC
  Administered 2023-09-13: 40 meq via ORAL
  Filled 2023-09-13: qty 2

## 2023-09-13 MED ORDER — EMPAGLIFLOZIN 10 MG PO TABS
10.0000 mg | ORAL_TABLET | Freq: Every day | ORAL | 0 refills | Status: AC
  Filled 2023-09-13: qty 30, 30d supply, fill #0

## 2023-09-13 NOTE — TOC Initial Note (Signed)
Transition of Care [facility]) - Initial/Assessment Note    Patient Details  Name: Heather Olsen MRN: [id number] Date of Birth: 02-Jul-1931  [facility]) CM/SW Contact:    Leone Haven, RN Phone Number: 09/13/2023, 8:54 AM  Clinical Narrative:                 From home alone, has PCP and insurance on file, states has no HH services in place at this time or DME at home.  States security from [facility] will transport them home at [employer] and family is support system, states gets medications from [place] [facility] [place] [address].  Pta self ambulatory .  Expected Discharge Plan: Home/Self Care Barriers to Discharge: No Barriers Identified   Patient Goals and CMS Choice Patient states their goals for this hospitalization and ongoing recovery are:: return to ID[facility] at [facility]   Choice offered to / list presented to : NA      Expected Discharge Plan and Services In-house Referral: NA Discharge Planning Services: CM Consult Post Acute Care Choice: NA Living arrangements for the past 2 months: Independent Living Facility Expected Discharge Date: 09/13/23               DME Arranged: N/A DME Agency: NA       HH Arranged: NA          Prior Living Arrangements/Services Living arrangements for the past 2 months: Independent Living Facility Lives with:: Self Patient language and need for interpreter reviewed:: Yes Do you feel safe going back to the place where you live?: Yes      Need for Family Participation in Patient Care: Yes (Comment) Care giver support system in place?: Yes (comment)   Criminal Activity/Legal Involvement Pertinent to Current Situation/Hospitalization: No - Comment as needed  Activities of Daily Living      Permission Sought/Granted Permission sought to share information with : Case Manager Permission granted to share information with : Yes, Verbal Permission Granted              Emotional Assessment Appearance:: Appears  stated age Attitude/Demeanor/Rapport: Engaged Affect (typically observed): Appropriate Orientation: : Oriented to Self, Oriented to Place, Oriented to  Time, Oriented to Situation Alcohol / Substance Use: Not Applicable Psych Involvement: No (comment)  Admission diagnosis:  Hypoxia [R09.02] Acute on chronic diastolic (congestive) heart failure (HCC) [I50.33] Acute on chronic congestive heart failure, unspecified heart failure type [facility]) [I50.9] Patient Active Problem List   Diagnosis Date Noted   Acute on chronic diastolic (congestive) heart failure (HCC) 09/11/2023   CKD (chronic kidney disease) stage 2, GFR 60-89 ml/min 08/11/2023   Chronic vertigo 08/11/2023   Overactive bladder 08/11/2023   Elevated troponin 08/11/2023   Acute hypoxic respiratory failure (HCC) 08/11/2023   Macrocytic anemia 08/11/2023   Chronic lower back pain 08/11/2023   Hypoxia 08/10/2023   AKI (acute kidney injury) on CKD stage II (HCC) 07/27/2023   GAD (generalized anxiety disorder) 07/27/2023   First degree AV block 07/27/2023   Acute exacerbation of CHF (congestive heart failure) (HCC) 07/26/2023   CHF (congestive heart failure) (HCC) 08/31/2022   Acute on chronic diastolic CHF (congestive heart failure) (HCC) 08/30/2022   Diverticulitis 08/25/2022   Chronic diastolic CHF (congestive heart failure) (HCC) 08/25/2022   Unintentional weight loss 08/25/2022   Metatarsalgia, left foot 02/25/2022   Anxiety 01/25/2022   Calcium pyrophosphate arthropathy of multiple sites 01/25/2022   Chronic insomnia 01/25/2022   Easy bruising 01/25/2022   Encounter  for general adult medical examination without abnormal findings 01/25/2022   GERD (gastroesophageal reflux disease) 01/25/2022   History of depression 01/25/2022   History of diverticulitis 01/25/2022   Hyperglycemia due to type 2 diabetes mellitus (HCC) 01/25/2022   Irritable bowel syndrome with diarrhea 01/25/2022   Long term (current) use of anticoagulants  - on Eliquis for PAF 01/25/2022   Long term (current) use of aspirin 01/25/2022   Memory loss 01/25/2022   Constipation 01/25/2022   Night sweats 01/25/2022   Nocturia 01/25/2022   Other idiopathic scoliosis, thoracolumbar region 01/25/2022   Other specified postprocedural states 01/25/2022   Pain in thoracic spine 01/25/2022   Presence of coronary angioplasty implant and graft 01/25/2022   Primary thrombophilia (HCC) 01/25/2022   Pseudogout 01/25/2022   Pure hypercholesterolemia 01/25/2022   Raynaud's phenomenon 01/25/2022   Recurrent major depression in remission (HCC) 01/25/2022   Restless leg syndrome 01/25/2022   Senile hyperkeratosis 01/25/2022   Trochanteric bursitis of left hip 01/25/2022   Dermatochalasia 09/28/2021   Type 2 diabetes mellitus (HCC) 08/05/2021   Pulmonary HTN (HCC) 04/11/2021   Paroxysmal atrial fibrillation (HCC) 05/31/2020   Dyspnea 12/29/2019   Degeneration of lumbar intervertebral disc 06/25/2019   Degenerative spondylolisthesis 06/25/2019   Osteopenia 06/25/2019   Atherosclerotic heart disease of native coronary artery without angina pectoris 03/01/2019   Leg swelling 03/01/2019   Dyslipidemia 03/01/2019   Bilateral carpal tunnel syndrome 03/27/2017   Urgency of micturition 10/11/2016   Hyperglycemia 09/14/2016   Tricuspid insufficiency 06/27/2016   CAD (coronary artery disease) 04/12/2016   Hyperlipidemia 04/12/2016   Major depression 04/12/2016   Idiopathic scoliosis 04/12/2016   Spinal stenosis of lumbar region 04/12/2016   Aortic stenosis 04/12/2016   Mitral regurgitation 04/12/2016   Pernicious anemia 04/12/2016   Hearing loss 04/12/2016   Glaucoma 04/12/2016   Palpitations 04/12/2016   Hiatal hernia 04/12/2016   Back pain 04/12/2016   Osteoporosis, senile 04/12/2016   Anemia due to vitamin B12 deficiency 04/12/2016   Raynaud's disease 04/12/2016   Balance disorder 04/12/2016   Beat, premature ventricular 01/08/2016   APC (atrial  premature contractions) 06/16/2014   Fatigue 06/17/2013   Essential hypertension 06/17/2013   PCP:  Merri Brunette, MD Pharmacy:   [facility] [address] [place] - [address] [place] [address] Phone: [phone] Fax: [phone]  [facility] [address] [place] [address] Phone: [phone] Fax: [phone]     Social Determinants of Health (SDOH) Social History: SDOH Screenings   Food Insecurity: No Food Insecurity (08/11/2023)  Housing: Patient Declined (09/12/2023)  Transportation Needs: No Transportation Needs (09/12/2023)  Utilities: Not At Risk (08/11/2023)  Alcohol Screen: Low Risk  (09/12/2023)  Financial Resource Strain: Low Risk  (09/12/2023)  Tobacco Use: Low Risk  (09/11/2023)   SDOH Interventions: Housing Interventions: Intervention Not Indicated Alcohol Usage Interventions: Intervention Not Indicated (Score <7) Financial Strain Interventions: Intervention Not Indicated   Readmission Risk Interventions     No data to display

## 2023-09-13 NOTE — Progress Notes (Signed)
Pt being d/c, VSS, IV removed, Education complete.   Balinda Quails, RN 09/13/2023 9:10 AM

## 2023-09-13 NOTE — TOC Transition Note (Signed)
Transition of Care [facility]) - CM/SW Discharge Note   Patient Details  Name: Heather Olsen MRN: [id number] Date of Birth: 16-Oct-1931  Transition of [facility]) CM/SW Contact:  Leone Haven, RN Phone Number: 09/13/2023, 8:55 AM   Clinical Narrative:    For dc today , back to IDL , security from [facility] will be down stairs at 11 am to transport her back today.   Final next level of care: Home/Self Care Barriers to Discharge: No Barriers Identified   Patient Goals and CMS Choice   Choice offered to / list presented to : NA  Discharge Placement                         Discharge Plan and Services Additional resources added to the After Visit Summary for   In-house Referral: NA Discharge Planning Services: CM Consult Post Acute Care Choice: NA          DME Arranged: N/A DME Agency: NA       HH Arranged: NA          Social Determinants of Health (SDOH) Interventions SDOH Screenings   Food Insecurity: No Food Insecurity (08/11/2023)  Housing: Patient Declined (09/12/2023)  Transportation Needs: No Transportation Needs (09/12/2023)  Utilities: Not At Risk (08/11/2023)  Alcohol Screen: Low Risk  (09/12/2023)  Financial Resource Strain: Low Risk  (09/12/2023)  Tobacco Use: Low Risk  (09/11/2023)     Readmission Risk Interventions     No data to display

## 2023-09-13 NOTE — Progress Notes (Signed)
   Heart Failure Stewardship Pharmacist Progress Note   PCP: Merri Brunette, MD PCP-Cardiologist: Rollene Rotunda, MD    HPI:  89 yo F with PMH of CHF, CAD, T2DM, pulmonary HTN, afib, chronic back pain, overactive bladder, anxiety, depression, and chronic anemia.  She presented to the ED on 10/14 with shortness of breath, LE edema, 3 lb weight gain in 1 day, and dizziness with taking increased dose of lasix. CXR with cardiomegaly and bibasilar atelectasis. BNP 638. Last ECHO 07/27/23 with EF 55-60%, G2DD, RV normal, mild MR.   Discharge HF Medications: Diuretic: furosemide 40 mg daily SGLT2i: Jardiance 10 mg daily  Prior to admission HF Medications: Diuretic: furosemide 40 mg daily  Pertinent Lab Values: Serum creatinine 0.88, BUN 16, Potassium 3.3, Sodium 132, BNP 638.2, A1c 7.5  Vital Signs: Weight: 103 lbs (admission weight: 112 lbs) Blood pressure: 130-160/70s  Heart rate: 60-70s  I/O: net -0.8L yesterday; net -2.8L since admission  Medication Assistance / Insurance Benefits Check: Does the patient have prescription insurance?  Yes Type of insurance plan: Medicare A/B + AARP supplement  Outpatient Pharmacy:  Prior to [facility]: [facility] Is the patient willing to use [facility] TOC pharmacy at discharge? Yes Is the patient willing to transition their outpatient pharmacy to utilize a [facility] outpatient pharmacy?   No    Assessment: 1. Acute on chronic diastolic CHF (LVEF 55-60%). NYHA class II symptoms. - Volume status improved, off IV lasix. Agree with continuing furosemide 40 mg daily at discharge. Strict I/Os and daily weights. Keep K>4. KCl 40 mEq BID ordered for replacement.  - GDMT limited by CKD - Continue Jardiance 10 mg daily   Plan: 1) Medication changes recommended at this time: - Agree with changes, discharge today  2) Patient assistance: - Jardiance copay $0  3)  Education  - Patient has been educated on current HF medications and  potential additions to HF medication regimen - Patient verbalizes understanding that over the next few months, these medication doses may change and more medications may be added to optimize HF regimen - Patient has been educated on basic disease state pathophysiology and goals of therapy   Sharen Hones, PharmD, BCPS Heart Failure Stewardship Pharmacist Phone [phone]

## 2023-09-13 NOTE — Consult Note (Signed)
[facility]   [facility] Inpatient Consult   09/13/2023  Cherrell Neyman January 16, 1931 [id number]  [facility] [[facility]]  Accountable Care Organization [ACO] Patient: Medicare ACO REACH  Primary Care Provider: Merri Brunette, MD [facility] this provider is listed for the transition of care follow up appointments.   RN Hospital Liaison screened the patient remotely at [facility].  Call attempts not successful at this time, patient already transitioned home.  [facility] lives alone.    The patient was screened for 30 day readmission hospitalization with noted high risk score for unplanned readmission risk 3 hospital admissions and 4 ED visits in 6 months.  The patient was assessed for potential [facility]) Care Management service needs for post hospital transition for care coordination. Review of patient's electronic medical record reveals patient is admitted with HF, CAD, T2DM, Afib.   Plan: Outpatient [facility] Liaison will continue to follow progress and disposition to asess for post hospital community care coordination/management needs.  Referral request for community care coordination: referral requested   Community Care Management/Population Health does not replace or interfere with any arrangements made by the Inpatient Transition of Care team.   For questions contact:   Charlesetta Shanks, RN, BSN, CCM [place]  [facility], [facility] Liaison Direct Dial: [phone] or secure chat Website: [email or web address]

## 2023-09-19 DIAGNOSIS — D1801 Hemangioma of skin and subcutaneous tissue: Secondary | ICD-10-CM | POA: Diagnosis not present

## 2023-09-19 DIAGNOSIS — D692 Other nonthrombocytopenic purpura: Secondary | ICD-10-CM | POA: Diagnosis not present

## 2023-09-19 DIAGNOSIS — L812 Freckles: Secondary | ICD-10-CM | POA: Diagnosis not present

## 2023-09-19 DIAGNOSIS — L821 Other seborrheic keratosis: Secondary | ICD-10-CM | POA: Diagnosis not present

## 2023-09-19 DIAGNOSIS — D225 Melanocytic nevi of trunk: Secondary | ICD-10-CM | POA: Diagnosis not present

## 2023-09-19 NOTE — Discharge Summary (Signed)
Physician Discharge Summary  Heather Olsen ZOX:[id number] DOB: July 12, 1931 DOA: 09/11/2023  PCP: Merri Brunette, MD  Admit date: 09/11/2023 Discharge date: 09/13/2023  Time spent: 35 minutes  Recommendations for Outpatient Follow-up:  PCP in 1 week, please check BMP at follow-up CHF TOC clinic in 2 to 3 weeks   Discharge Diagnoses:  Principal Problem:   Acute on chronic diastolic (congestive) heart failure (HCC) Active Problems:   Paroxysmal atrial fibrillation (HCC)   Type 2 diabetes mellitus (HCC)   CAD (coronary artery disease)   Dyslipidemia   Anxiety   History of depression   Discharge Condition: Improved  Diet recommendation: Low-sodium, diabetic  Filed Weights   09/11/23 1329 09/12/23 0115 09/13/23 0422  Weight: 50.8 kg 48.4 kg 46.8 kg    History of present illness:  89/F with history of CAD, diastolic CHF, pulmonary hypertension, paroxysmal A-fib on Eliquis, chronic back pain, overactive bladder, anxiety, depression, chronic anemia presented to the ED with some shortness of breath for few days, lower extremity edema and some dizziness, became hypoxic on ambulation in the ED, labs noted hemoglobin 10.2, creatinine 1.0, BNP 638, chest x-ray noted cardiomegaly and bibasilar atelectasis   Hospital Course:   Acute on chronic HFpEF Moderate pulmonary hypertension -Echo 8/24 with EF 55%, grade 2 DD, normal RV, moderately elevated PA systolic pressures -Admitted with mild volume overload, improved with diuresis, also started on Jardiance -Multiple recent hospitalizations reviewed, educated on low-salt diet, she is at the mercy of food provided [facility] -Improved and stable, weaned off oxygen, discharged home in a stable condition back to her independent living facility, follow-up with CHF TOC clinic -PCP in 1 week, please check BMP at follow-up   Non-insulin-dependent type 2 diabetes A1c 7.5 on 07/27/2023.   -Starting Jardiance, metformin  resumed at discharge   CAD: Labile, EKG without acute ischemic changes. -Continued on pindolol   Paroxysmal A-fib: Continue Eliquis and pindolol   Hypertension: Stable.   Overactive bladder: Resume Myrbetriq   Anxiety and depression RLS -Resume Zoloft, tramadol, Requip  Discharge Exam: Vitals:   09/13/23 0329 09/13/23 0746  BP: (!) 161/74 135/75  Pulse: 69 61  Resp: 16 17  Temp: 98.4 F (36.9 C) 98.8 F (37.1 C)  SpO2: 95% 96%      Discharge Instructions   Discharge Instructions     Diet - low sodium heart healthy   Complete by: As directed    Increase activity slowly   Complete by: As directed       Allergies as of 09/13/2023       Reactions   Sulfasalazine Other (See Comments)   Other reaction(s): Other (See Comments), headaches, headaches, headaches   Topiramate Rash   Ace Inhibitors Cough   Codeine Nausea Only   Ibandronic Acid Nausea Only   Meloxicam    Other reaction(s): GI upset, GI Upset (intolerance), Other (See Comments) unknown   Prevacid [lansoprazole]    Chest pain   Sulfa Antibiotics Other (See Comments)   headaches   Verapamil Other (See Comments)   constipation   Zolpidem Other (See Comments)   hallucinations        Medication List     TAKE these medications    acetaminophen 650 MG CR tablet Commonly known as: TYLENOL Take 1,300 mg by mouth 2 (two) times daily.   Eliquis 2.5 MG Tabs tablet Generic drug: apixaban Take 2.5 mg by mouth 2 (two) times daily.   furosemide 40 MG tablet Commonly known as: LASIX  Take 1 tablet (40 mg total) by mouth daily. What changed: Another medication with the same name was removed. Continue taking this medication, and follow the directions you see here.   ipratropium 0.06 % nasal spray Commonly known as: ATROVENT Use 2 sprays in each nostril up to three times daily before meals for sinus drainage. What changed:  how much to take how to take this when to take this additional  instructions   Jardiance 10 MG Tabs tablet Generic drug: empagliflozin Take 1 tablet (10 mg total) by mouth daily.   Linzess 145 MCG Caps capsule Generic drug: linaclotide Take 145 mcg by mouth daily as needed (constipation).   metFORMIN 500 MG 24 hr tablet Commonly known as: GLUCOPHAGE-XR Take 1,000 mg by mouth every evening.   mirabegron ER 50 MG Tb24 tablet Commonly known as: MYRBETRIQ Take 50 mg by mouth daily.   MULTIPLE VITAMINS-MINERALS PO Take 1 tablet by mouth daily.   nitroGLYCERIN 0.4 MG SL tablet Commonly known as: NITROSTAT Place 1 tablet (0.4 mg total) under the tongue every 5 (five) minutes as needed for chest pain. Place one tablet under the tongue every 5 minutes as needed for chest pain. No more than 3   pantoprazole 40 MG tablet Commonly known as: PROTONIX Take 40 mg by mouth 2 (two) times daily.   pindolol 5 MG tablet Commonly known as: VISKEN Take 1/2 (one-half) tablet by mouth twice daily What changed: See the new instructions.   potassium chloride SA 20 MEQ tablet Commonly known as: KLOR-CON M Take 1 tablet (20 mEq total) by mouth daily.   PROBIOTIC DAILY PO Take 1 capsule by mouth every evening.   Prolia 60 MG/ML Sosy injection Generic drug: denosumab Inject 60 mg into the skin every 6 (six) months.   rOPINIRole 0.5 MG tablet Commonly known as: REQUIP Take 0.5 mg by mouth at bedtime.   rosuvastatin 10 MG tablet Commonly known as: CRESTOR TAKE 1 TABLET BY MOUTH AT BEDTIME What changed: when to take this   sertraline 50 MG tablet Commonly known as: ZOLOFT Take 50 mg by mouth daily.   traMADol 50 MG tablet Commonly known as: ULTRAM TAKE 1 TABLET BY MOUTH EVERY 4 HOURS What changed: See the new instructions.   traZODone 50 MG tablet Commonly known as: DESYREL Take 50 mg by mouth 2 (two) times daily.   Vitamin D3 50 MCG (2000 UT) capsule Take 2,000 Units by mouth daily.       Allergies  Allergen Reactions   Sulfasalazine  Other (See Comments)    Other reaction(s): Other (See Comments), headaches, headaches, headaches   Topiramate Rash   Ace Inhibitors Cough   Codeine Nausea Only   Ibandronic Acid Nausea Only   Meloxicam     Other reaction(s): GI upset, GI Upset (intolerance), Other (See Comments) unknown    Prevacid [Lansoprazole]     Chest pain   Sulfa Antibiotics Other (See Comments)    headaches   Verapamil Other (See Comments)    constipation   Zolpidem Other (See Comments)    hallucinations    Follow-up Information     [facility] at [facility] .   Specialty: Emergency Medicine Contact information: [address] [place] [address] [phone]        Merri Brunette, MD. Go on 10/03/2023.   Specialty: Internal Medicine Why: @9 :Azzie Roup information: [address] [place] [address] [phone]         [facility]. Go in 12 day(s).   Specialty: Cardiology Why: Heart Failure Clinic Appt 09/25/23 @ 2:00pm Please bring medication list and weight log to follow-up appt. [facility] [employer], Entrance C off of [employer] information: [address] [place] [address] [phone]                 The results of significant diagnostics from this hospitalization (including imaging, microbiology, ancillary and laboratory) are listed below for reference.    Significant Diagnostic Studies: DG Chest 2 View  Result Date: 09/11/2023 CLINICAL DATA:  Elevated BNP. EXAM: CHEST - 2 VIEW COMPARISON:  Chest x-ray 08/15/2023 FINDINGS: The heart is enlarged. There is minimal bibasilar atelectasis. The lungs are otherwise clear. There is no pleural effusion or pneumothorax. There is dextroconvex scoliosis of the thoracic spine. IMPRESSION: Cardiomegaly. Minimal bibasilar atelectasis. Electronically Signed   By: Darliss Cheney M.D.   On:  09/11/2023 19:15    Microbiology: No results found for this or any previous visit (from the past 240 hour(s)).   Labs: Basic Metabolic Panel: Recent Labs  Lab 09/13/23 0319  NA 132*  K 3.3*  CL 94*  CO2 26  GLUCOSE 166*  BUN 16  CREATININE 0.88  CALCIUM 9.2   Liver Function Tests: No results for input(s): "AST", "ALT", "ALKPHOS", "BILITOT", "PROT", "ALBUMIN" in the last 168 hours. No results for input(s): "LIPASE", "AMYLASE" in the last 168 hours. No results for input(s): "AMMONIA" in the last 168 hours. CBC: No results for input(s): "WBC", "NEUTROABS", "HGB", "HCT", "MCV", "PLT" in the last 168 hours. Cardiac Enzymes: No results for input(s): "CKTOTAL", "CKMB", "CKMBINDEX", "TROPONINI" in the last 168 hours. BNP: BNP (last 3 results) Recent Labs    08/10/23 1720 08/15/23 1630 09/11/23 1412  BNP 842.0* 301.8* 638.2*    ProBNP (last 3 results) No results for input(s): "PROBNP" in the last 8760 hours.  CBG: Recent Labs  Lab 09/12/23 1046 09/12/23 1547 09/12/23 2053 09/13/23 0625  GLUCAP 175* 138* 190* 132*       Signed:  Zannie Cove MD.  [facility] 09/19/2023, 8:32 AM

## 2023-09-25 ENCOUNTER — Encounter (HOSPITAL_COMMUNITY): Payer: Medicare Other

## 2023-10-08 NOTE — Progress Notes (Deleted)
Cardiology Office Note:  .   Date:  10/08/2023  ID:  Heather Tracie, Olsen 1931-10-12, MRN [id number] PCP: Merri Brunette, MD  [facility] Providers Cardiologist:  Rollene Rotunda, MD   History of Present Illness: .   Heather Olsen is a 89 y.o. female with a past medical history of HTN, chronic diastolic heart failure, CAD, HLD, Raynaud's syndrome, tricuspid insufficiency, PAF, pulmonary HTN, GERD, tye 2 DM, hearing loss, osteopenia, palpitations. Patient is followed by Dr. Antoine Poche and presents today for a 2 month follow up.   Patiet previously underwent cardiac catheterization and stent placement in 2008. Was followed by [facility] at that time. Follow up catheterization in 2009 showed 30% narrowing of her stent but she was otherwise disease free. Stress test in 2014 was negative, as was stress test in 2017. Later, she complained of dizziness and cardiac monitor in 2021 showed short episodes of atrial fibrillation. Echocardiogram 08/13/20 showed EF 60-65%, no regional wall motion abnormalities, grade II DD, normal RV function, moderately elevated pulmonary artery systolic pressure, mild mitral valve regurgitation, mild-moderate tricuspid regurgitation.   Most recent echocardiogram from 07/27/23 showed EF 55-60%, no regional wall motion abnormalities, grade II DD, normal RV function, moderately elevated PA systolic pressure, mild-moderate tricuspid regurgitation.   Patient was last seen by Dr. Antoine Poche on 08/07/22. At that time, she had recently been admitted to the hospital with volume overload. She was hesitant to take too much lasix because it made her dizzy. Because of this, she had to go to the hospital a few times. She was given lasix to take as needed for weight gain.  Patient was admitted from 9/12 - 9/14 with acute heart failure.  She was treated with IV Lasix, and was transitioned to oral Lasix 40 mg daily at discharge.  She was again seen in the ED on 9/17 with peripheral edema.  She was  given a small dose of IV Lasix with improvement.  Her BNP was down to 300 (was previously 800.  She was discharged from the ED  Most recently, patient was admitted from 10/14 - 10/16 with CHF.  Again was treated with IV diuresis. Was also started on Jardiance. Was discharged on PO lasix 40 mg daily    Chronic Diastolic Heart Failure  Pulmonary HTN  - Most recent echocardiogram from 07/27/23 showed EF 55-60%, no regional wall motion abnormalities, grade II DD, normal RV function, moderately elevated pulmonary artery systolic pressure  - Patient has been seen in the hospital 3 times in the past 2 months due to chf exacerbations  - Now on lasix 40 mg daily  - Continue jardiance 10 mg daily  - Discussed low sodium diets. Discussed taking extra doses of lasix as needed for weight gain  - Ordered BMP   HTN   CAD - Patient previously had stent placed in 2008. Most recent ischemic evaluation was a nuclear stress test in 2017 that was a normal, low risk study  - Not on ASA due to eliquis use  - Continue crestor 10 mg daily   Paroxysmal Atrial Fibrillation  -  - Continue pindolol 2.5 BID  - continue eliquis 2.5 mg BID- this ithe correct dose for her based on age, weight. Denies bleeding on eliquis   Tricuspid Regurgitation  - Echocardiogram from 07/27/23 showed mild-moderate tricuspid regurgitation  - With patient's advanced age, she will not be candidate for tricuspid valve intervention. NO plans to monitor at this time   ROS: ***  Studies Reviewed: .        ***  Risk Assessment/Calculations:   {Does this patient have ATRIAL FIBRILLATION?[phone]} No BP recorded.  {Refresh Note OR Click here to enter BP  :1}***       Physical Exam:   VS:  There were no vitals taken for this visit.   Wt Readings from Last 3 Encounters:  09/13/23 103 lb 2.8 oz (46.8 kg)  08/31/23 119 lb (54 kg)  08/12/23 119 lb 14.9 oz (54.4 kg)    GEN: Well nourished, well developed in no acute distress NECK: No  JVD; No carotid bruits CARDIAC: ***RRR, no murmurs, rubs, gallops RESPIRATORY:  Clear to auscultation without rales, wheezing or rhonchi  ABDOMEN: Soft, non-tender, non-distended EXTREMITIES:  No edema; No deformity   ASSESSMENT AND PLAN: .   ***    {Are you ordering a CV Procedure (e.g. stress test, cath, DCCV, TEE, etc)?   Press F2        :[id number]}  Dispo: ***  Signed, Jonita Albee, PA-C

## 2023-10-10 ENCOUNTER — Ambulatory Visit: Payer: Medicare Other | Admitting: Cardiology

## 2023-10-17 DIAGNOSIS — M47812 Spondylosis without myelopathy or radiculopathy, cervical region: Secondary | ICD-10-CM | POA: Diagnosis not present

## 2023-10-28 ENCOUNTER — Other Ambulatory Visit (HOSPITAL_COMMUNITY): Payer: Self-pay

## 2023-11-02 DIAGNOSIS — D539 Nutritional anemia, unspecified: Secondary | ICD-10-CM | POA: Diagnosis not present

## 2023-11-02 DIAGNOSIS — R5381 Other malaise: Secondary | ICD-10-CM | POA: Diagnosis not present

## 2023-11-02 DIAGNOSIS — I10 Essential (primary) hypertension: Secondary | ICD-10-CM | POA: Diagnosis not present

## 2023-11-02 DIAGNOSIS — I5032 Chronic diastolic (congestive) heart failure: Secondary | ICD-10-CM | POA: Diagnosis not present

## 2023-11-11 DIAGNOSIS — N3001 Acute cystitis with hematuria: Secondary | ICD-10-CM | POA: Diagnosis not present

## 2023-11-20 DIAGNOSIS — R413 Other amnesia: Secondary | ICD-10-CM | POA: Diagnosis not present

## 2023-11-20 DIAGNOSIS — E871 Hypo-osmolality and hyponatremia: Secondary | ICD-10-CM | POA: Diagnosis not present

## 2023-11-23 ENCOUNTER — Other Ambulatory Visit: Payer: Self-pay | Admitting: Internal Medicine

## 2023-11-23 DIAGNOSIS — R413 Other amnesia: Secondary | ICD-10-CM

## 2023-11-30 DIAGNOSIS — I10 Essential (primary) hypertension: Secondary | ICD-10-CM | POA: Diagnosis not present

## 2023-12-04 ENCOUNTER — Other Ambulatory Visit: Payer: Self-pay

## 2023-12-04 ENCOUNTER — Emergency Department (HOSPITAL_COMMUNITY): Payer: Medicare Other

## 2023-12-04 ENCOUNTER — Inpatient Hospital Stay (HOSPITAL_COMMUNITY)
Admission: EM | Admit: 2023-12-04 | Discharge: 2023-12-06 | DRG: 392 | Disposition: A | Discharge status: Nursing Home (Includes ICF) | Payer: Medicare Other | Source: Skilled Nursing Facility | Attending: Family Medicine | Admitting: Family Medicine

## 2023-12-04 ENCOUNTER — Ambulatory Visit: Payer: Medicare Other | Admitting: Neurology

## 2023-12-04 ENCOUNTER — Telehealth: Payer: Self-pay | Admitting: Neurology

## 2023-12-04 DIAGNOSIS — I10 Essential (primary) hypertension: Secondary | ICD-10-CM | POA: Diagnosis present

## 2023-12-04 DIAGNOSIS — D539 Nutritional anemia, unspecified: Secondary | ICD-10-CM | POA: Diagnosis present

## 2023-12-04 DIAGNOSIS — E119 Type 2 diabetes mellitus without complications: Secondary | ICD-10-CM

## 2023-12-04 DIAGNOSIS — K219 Gastro-esophageal reflux disease without esophagitis: Secondary | ICD-10-CM | POA: Diagnosis present

## 2023-12-04 DIAGNOSIS — F329 Major depressive disorder, single episode, unspecified: Secondary | ICD-10-CM | POA: Diagnosis not present

## 2023-12-04 DIAGNOSIS — R918 Other nonspecific abnormal finding of lung field: Secondary | ICD-10-CM | POA: Diagnosis not present

## 2023-12-04 DIAGNOSIS — Z888 Allergy status to other drugs, medicaments and biological substances status: Secondary | ICD-10-CM | POA: Diagnosis not present

## 2023-12-04 DIAGNOSIS — K5792 Diverticulitis of intestine, part unspecified, without perforation or abscess without bleeding: Secondary | ICD-10-CM | POA: Diagnosis present

## 2023-12-04 DIAGNOSIS — F411 Generalized anxiety disorder: Secondary | ICD-10-CM | POA: Diagnosis present

## 2023-12-04 DIAGNOSIS — H409 Unspecified glaucoma: Secondary | ICD-10-CM | POA: Diagnosis not present

## 2023-12-04 DIAGNOSIS — I130 Hypertensive heart and chronic kidney disease with heart failure and stage 1 through stage 4 chronic kidney disease, or unspecified chronic kidney disease: Secondary | ICD-10-CM | POA: Diagnosis present

## 2023-12-04 DIAGNOSIS — I2489 Other forms of acute ischemic heart disease: Secondary | ICD-10-CM | POA: Diagnosis present

## 2023-12-04 DIAGNOSIS — E785 Hyperlipidemia, unspecified: Secondary | ICD-10-CM | POA: Diagnosis present

## 2023-12-04 DIAGNOSIS — E871 Hypo-osmolality and hyponatremia: Secondary | ICD-10-CM | POA: Diagnosis not present

## 2023-12-04 DIAGNOSIS — N179 Acute kidney failure, unspecified: Secondary | ICD-10-CM | POA: Diagnosis not present

## 2023-12-04 DIAGNOSIS — K5730 Diverticulosis of large intestine without perforation or abscess without bleeding: Secondary | ICD-10-CM | POA: Diagnosis not present

## 2023-12-04 DIAGNOSIS — M810 Age-related osteoporosis without current pathological fracture: Secondary | ICD-10-CM | POA: Diagnosis present

## 2023-12-04 DIAGNOSIS — H9190 Unspecified hearing loss, unspecified ear: Secondary | ICD-10-CM | POA: Diagnosis present

## 2023-12-04 DIAGNOSIS — Z885 Allergy status to narcotic agent status: Secondary | ICD-10-CM

## 2023-12-04 DIAGNOSIS — R0902 Hypoxemia: Secondary | ICD-10-CM | POA: Diagnosis present

## 2023-12-04 DIAGNOSIS — I080 Rheumatic disorders of both mitral and aortic valves: Secondary | ICD-10-CM | POA: Diagnosis present

## 2023-12-04 DIAGNOSIS — Z79899 Other long term (current) drug therapy: Secondary | ICD-10-CM | POA: Diagnosis not present

## 2023-12-04 DIAGNOSIS — I5032 Chronic diastolic (congestive) heart failure: Secondary | ICD-10-CM | POA: Diagnosis not present

## 2023-12-04 DIAGNOSIS — Z882 Allergy status to sulfonamides status: Secondary | ICD-10-CM

## 2023-12-04 DIAGNOSIS — R1084 Generalized abdominal pain: Secondary | ICD-10-CM | POA: Diagnosis not present

## 2023-12-04 DIAGNOSIS — R1030 Lower abdominal pain, unspecified: Secondary | ICD-10-CM | POA: Diagnosis not present

## 2023-12-04 DIAGNOSIS — R413 Other amnesia: Secondary | ICD-10-CM | POA: Diagnosis present

## 2023-12-04 DIAGNOSIS — Z886 Allergy status to analgesic agent status: Secondary | ICD-10-CM

## 2023-12-04 DIAGNOSIS — K8689 Other specified diseases of pancreas: Secondary | ICD-10-CM | POA: Diagnosis not present

## 2023-12-04 DIAGNOSIS — Z7984 Long term (current) use of oral hypoglycemic drugs: Secondary | ICD-10-CM

## 2023-12-04 DIAGNOSIS — I7300 Raynaud's syndrome without gangrene: Secondary | ICD-10-CM | POA: Diagnosis present

## 2023-12-04 DIAGNOSIS — Z8249 Family history of ischemic heart disease and other diseases of the circulatory system: Secondary | ICD-10-CM

## 2023-12-04 DIAGNOSIS — D519 Vitamin B12 deficiency anemia, unspecified: Secondary | ICD-10-CM | POA: Diagnosis present

## 2023-12-04 DIAGNOSIS — I480 Paroxysmal atrial fibrillation: Secondary | ICD-10-CM | POA: Diagnosis not present

## 2023-12-04 DIAGNOSIS — I700 Atherosclerosis of aorta: Secondary | ICD-10-CM | POA: Diagnosis not present

## 2023-12-04 DIAGNOSIS — I2510 Atherosclerotic heart disease of native coronary artery without angina pectoris: Secondary | ICD-10-CM | POA: Diagnosis present

## 2023-12-04 DIAGNOSIS — E1122 Type 2 diabetes mellitus with diabetic chronic kidney disease: Secondary | ICD-10-CM | POA: Diagnosis present

## 2023-12-04 DIAGNOSIS — K5732 Diverticulitis of large intestine without perforation or abscess without bleeding: Principal | ICD-10-CM | POA: Diagnosis present

## 2023-12-04 DIAGNOSIS — Z7901 Long term (current) use of anticoagulants: Secondary | ICD-10-CM | POA: Diagnosis not present

## 2023-12-04 DIAGNOSIS — N1831 Chronic kidney disease, stage 3a: Secondary | ICD-10-CM | POA: Diagnosis not present

## 2023-12-04 DIAGNOSIS — R0602 Shortness of breath: Secondary | ICD-10-CM | POA: Diagnosis not present

## 2023-12-04 DIAGNOSIS — R1032 Left lower quadrant pain: Secondary | ICD-10-CM | POA: Diagnosis not present

## 2023-12-04 DIAGNOSIS — R7989 Other specified abnormal findings of blood chemistry: Secondary | ICD-10-CM | POA: Diagnosis present

## 2023-12-04 LAB — URINALYSIS, ROUTINE W REFLEX MICROSCOPIC
Bilirubin Urine: NEGATIVE
Glucose, UA: 50 mg/dL — AB
Hgb urine dipstick: NEGATIVE
Ketones, ur: NEGATIVE mg/dL
Nitrite: NEGATIVE
Protein, ur: 30 mg/dL — AB
Specific Gravity, Urine: 1.012 (ref 1.005–1.030)
pH: 5 (ref 5.0–8.0)

## 2023-12-04 LAB — CBG MONITORING, ED: Glucose-Capillary: 159 mg/dL — ABNORMAL HIGH (ref 70–99)

## 2023-12-04 LAB — CBC
HCT: 33.3 % — ABNORMAL LOW (ref 36.0–46.0)
Hemoglobin: 11.1 g/dL — ABNORMAL LOW (ref 12.0–15.0)
MCH: 34.5 pg — ABNORMAL HIGH (ref 26.0–34.0)
MCHC: 33.3 g/dL (ref 30.0–36.0)
MCV: 103.4 fL — ABNORMAL HIGH (ref 80.0–100.0)
Platelets: 304 10*3/uL (ref 150–400)
RBC: 3.22 MIL/uL — ABNORMAL LOW (ref 3.87–5.11)
RDW: 13.4 % (ref 11.5–15.5)
WBC: 11.3 10*3/uL — ABNORMAL HIGH (ref 4.0–10.5)
nRBC: 0 % (ref 0.0–0.2)

## 2023-12-04 LAB — TROPONIN I (HIGH SENSITIVITY)
Troponin I (High Sensitivity): 22 ng/L — ABNORMAL HIGH (ref <18)
Troponin I (High Sensitivity): 18 ng/L — ABNORMAL HIGH (ref <18)

## 2023-12-04 LAB — COMPREHENSIVE METABOLIC PANEL
ALT: 16 U/L (ref 0–44)
AST: 31 U/L (ref 15–41)
Albumin: 3.4 g/dL — ABNORMAL LOW (ref 3.5–5.0)
Alkaline Phosphatase: 65 U/L (ref 38–126)
Anion gap: 16 — ABNORMAL HIGH (ref 5–15)
BUN: 35 mg/dL — ABNORMAL HIGH (ref 8–23)
CO2: 24 mmol/L (ref 22–32)
Calcium: 9 mg/dL (ref 8.9–10.3)
Chloride: 88 mmol/L — ABNORMAL LOW (ref 98–111)
Creatinine, Ser: 1.9 mg/dL — ABNORMAL HIGH (ref 0.44–1.00)
GFR, Estimated: 25 mL/min — ABNORMAL LOW (ref >60)
Glucose, Bld: 113 mg/dL — ABNORMAL HIGH (ref 70–99)
Potassium: 3.6 mmol/L (ref 3.5–5.1)
Sodium: 128 mmol/L — ABNORMAL LOW (ref 135–145)
Total Bilirubin: 1 mg/dL (ref 0.0–1.2)
Total Protein: 6.8 g/dL (ref 6.5–8.1)

## 2023-12-04 LAB — BRAIN NATRIURETIC PEPTIDE: B Natriuretic Peptide: 793.7 pg/mL — ABNORMAL HIGH (ref 0.0–100.0)

## 2023-12-04 LAB — LIPASE, BLOOD: Lipase: 25 U/L (ref 11–51)

## 2023-12-04 MED ORDER — FUROSEMIDE 40 MG PO TABS: 40.0000 mg | ORAL_TABLET | Freq: Every day | ORAL | Status: DC

## 2023-12-04 MED ORDER — MIRTAZAPINE 15 MG PO TABS
15.0000 mg | ORAL_TABLET | Freq: Every day | ORAL | Status: DC
  Administered 2023-12-04 – 2023-12-05 (×2): 15 mg via ORAL
  Filled 2023-12-04: qty 1
  Filled 2023-12-04: qty 2

## 2023-12-04 MED ORDER — SODIUM CHLORIDE 0.9 % IV SOLN
2.0000 g | Freq: Once | INTRAVENOUS | Status: AC
  Administered 2023-12-04: 2 g via INTRAVENOUS
  Filled 2023-12-04: qty 20

## 2023-12-04 MED ORDER — IPRATROPIUM BROMIDE 0.06 % NA SOLN: 2.0000 | Freq: Every day | NASAL | Status: DC | PRN

## 2023-12-04 MED ORDER — METRONIDAZOLE 500 MG/100ML IV SOLN
500.0000 mg | Freq: Once | INTRAVENOUS | Status: AC
  Administered 2023-12-04: 500 mg via INTRAVENOUS
  Filled 2023-12-04: qty 100

## 2023-12-04 MED ORDER — VITAMIN B-12 1000 MCG PO TABS
1000.0000 ug | ORAL_TABLET | Freq: Every day | ORAL | Status: DC
  Administered 2023-12-04 – 2023-12-06 (×3): 1000 ug via ORAL
  Filled 2023-12-04 (×3): qty 1

## 2023-12-04 MED ORDER — DONEPEZIL HCL 5 MG PO TABS
5.0000 mg | ORAL_TABLET | Freq: Every day | ORAL | Status: DC
  Administered 2023-12-04 – 2023-12-05 (×2): 5 mg via ORAL
  Filled 2023-12-04 (×2): qty 1

## 2023-12-04 MED ORDER — ROSUVASTATIN CALCIUM 10 MG PO TABS
10.0000 mg | ORAL_TABLET | Freq: Every day | ORAL | Status: DC
  Administered 2023-12-04 – 2023-12-05 (×2): 10 mg via ORAL
  Filled 2023-12-04 (×2): qty 1

## 2023-12-04 MED ORDER — METRONIDAZOLE 500 MG/100ML IV SOLN
500.0000 mg | Freq: Two times a day (BID) | INTRAVENOUS | Status: DC
  Administered 2023-12-05 – 2023-12-06 (×3): 500 mg via INTRAVENOUS
  Filled 2023-12-04 (×3): qty 100

## 2023-12-04 MED ORDER — APIXABAN 2.5 MG PO TABS
2.5000 mg | ORAL_TABLET | Freq: Two times a day (BID) | ORAL | Status: DC
  Administered 2023-12-04 – 2023-12-06 (×4): 2.5 mg via ORAL
  Filled 2023-12-04 (×4): qty 1

## 2023-12-04 MED ORDER — EMPAGLIFLOZIN 10 MG PO TABS: 10.0000 mg | ORAL_TABLET | Freq: Every day | ORAL | Status: DC

## 2023-12-04 MED ORDER — ONDANSETRON HCL 4 MG PO TABS: 4.0000 mg | ORAL_TABLET | Freq: Four times a day (QID) | ORAL | Status: DC | PRN

## 2023-12-04 MED ORDER — SODIUM CHLORIDE 0.9 % IV SOLN
2.0000 g | INTRAVENOUS | Status: DC
  Administered 2023-12-05: 2 g via INTRAVENOUS
  Filled 2023-12-04: qty 20

## 2023-12-04 MED ORDER — SERTRALINE HCL 50 MG PO TABS
50.0000 mg | ORAL_TABLET | Freq: Every day | ORAL | Status: DC
  Administered 2023-12-05 – 2023-12-06 (×2): 50 mg via ORAL
  Filled 2023-12-04 (×2): qty 1

## 2023-12-04 MED ORDER — PINDOLOL 5 MG PO TABS
2.5000 mg | ORAL_TABLET | Freq: Two times a day (BID) | ORAL | Status: DC
  Administered 2023-12-04 – 2023-12-06 (×4): 2.5 mg via ORAL
  Filled 2023-12-04 (×4): qty 1

## 2023-12-04 MED ORDER — ROPINIROLE HCL 0.25 MG PO TABS
0.5000 mg | ORAL_TABLET | Freq: Every day | ORAL | Status: DC
  Administered 2023-12-04 – 2023-12-05 (×2): 0.5 mg via ORAL
  Filled 2023-12-04: qty 2
  Filled 2023-12-04: qty 1

## 2023-12-04 MED ORDER — OXYCODONE HCL 5 MG PO TABS
2.5000 mg | ORAL_TABLET | ORAL | Status: DC | PRN
  Administered 2023-12-04 – 2023-12-05 (×4): 2.5 mg via ORAL
  Filled 2023-12-04 (×4): qty 1

## 2023-12-04 MED ORDER — NITROGLYCERIN 0.4 MG SL SUBL: 0.4000 mg | SUBLINGUAL_TABLET | SUBLINGUAL | Status: DC | PRN

## 2023-12-04 MED ORDER — PANTOPRAZOLE SODIUM 40 MG PO TBEC
40.0000 mg | DELAYED_RELEASE_TABLET | Freq: Two times a day (BID) | ORAL | Status: DC
  Administered 2023-12-04 – 2023-12-06 (×4): 40 mg via ORAL
  Filled 2023-12-04 (×4): qty 1

## 2023-12-04 MED ORDER — MIRABEGRON ER 25 MG PO TB24
50.0000 mg | ORAL_TABLET | Freq: Every day | ORAL | Status: DC
  Administered 2023-12-04 – 2023-12-05 (×2): 50 mg via ORAL
  Filled 2023-12-04 (×2): qty 2

## 2023-12-04 MED ORDER — ONDANSETRON HCL 4 MG/2ML IJ SOLN: 4.0000 mg | Freq: Four times a day (QID) | INTRAMUSCULAR | Status: DC | PRN

## 2023-12-04 MED ORDER — FENTANYL CITRATE PF 50 MCG/ML IJ SOSY
25.0000 ug | PREFILLED_SYRINGE | Freq: Once | INTRAMUSCULAR | Status: AC
  Administered 2023-12-04: 25 ug via INTRAVENOUS
  Filled 2023-12-04: qty 1

## 2023-12-04 MED ORDER — PROCHLORPERAZINE EDISYLATE 10 MG/2ML IJ SOLN
5.0000 mg | Freq: Once | INTRAMUSCULAR | Status: AC
  Administered 2023-12-04: 5 mg via INTRAVENOUS
  Filled 2023-12-04: qty 2

## 2023-12-04 MED ORDER — ACETAMINOPHEN 650 MG RE SUPP: 650.0000 mg | Freq: Four times a day (QID) | RECTAL | Status: DC | PRN

## 2023-12-04 MED ORDER — ACETAMINOPHEN 325 MG PO TABS
650.0000 mg | ORAL_TABLET | Freq: Four times a day (QID) | ORAL | Status: DC | PRN
  Administered 2023-12-05: 650 mg via ORAL
  Filled 2023-12-04: qty 2

## 2023-12-04 NOTE — ED Triage Notes (Signed)
 Pt BIBA from home. C/o LLQ abd pain that began at 8p yesterday. No n/v/d.  SPO2 91 in triage. Placed on 2l [place]- up to 97  AOx4

## 2023-12-04 NOTE — H&P (Signed)
 History and Physical    Patient: Heather Olsen FMW:[id number] DOB: Jul 13, 1931 DOA: 12/04/2023 DOS: the patient was seen and examined on 12/04/2023 PCP: Clarice Nottingham, MD  Patient coming from: Home  Chief Complaint:  Chief Complaint  Patient presents with   Abdominal Pain   HPI: Heather Olsen is a 89 y.o. female with medical history significant of aortic stenosis, mitral regurgitation, CAD, depression, hiatal hernia, hyperlipidemia, hyperglycemia, hypertension, idiopathic scoliosis, osteoporosis, pernicious anemia, Raynaud's disease, spinal stenosis of lumbar region who presented to the emergency department with complaints of left-sided abdominal pain with an episode of nausea, which the patient attributes to having cough, since yesterday evening around 2000.  She was also mildly hypoxic in triage with an O2 sat of 91% was placed on nasal cannula at 2 LPM which brought her O2 saturation to 97%. He denied fever, chills, rhinorrhea, sore throat, wheezing or hemoptysis.  No chest pain, palpitations, diaphoresis, PND, orthopnea or pitting edema of the lower extremities.  No emesis, diarrhea,  melena or hematochezia.  She gets occasionally constipated.  No flank pain, dysuria, frequency or hematuria.  No polyuria, polydipsia, polyphagia or blurred vision.   Lab work: Urinalysis is hazy with glucose of 50 and protein of 30 mg deciliter.  There was trace leukocyte esterase with rare bacteria on microscopic examination.  Troponin was 22 ng/L and BNP 794 pg/mL.  Lipase was normal.  CMP showed a sodium 128, potassium 3.6, chloride 88 and CO2 24 mmol/L.  Glucose 113, BUN 35 and creatinine 1.9 mg/dL.  LFTs were normal, except for an albumin of 3.4 g/dL.  Imaging: One-view portable chest radiograph showing moderate enlargement of the cardiopericardial silhouette.  Suspect scaring or atelectasis of the left lung base.  Substantial dextroconvex thoracic scoliosis.  CT abdomen/pelvis with contrast showed 2 segments of  inflamed colon, 1 along the distal descending colon and 1 along the proximal sigmoid colon, favoring multifocal diverticulitis of multifocal colitis.  No extraluminal gas, pneumatosis or drainable abscess observed.  Moderate cardiomegaly.  Aortic and systemic atherosclerosis.  Lumbar and sacral spine DDD changes.  Degenerative changes of both hips.   ED course: Initial vital signs were temperature 99.4 F, pulse 60, respirations 16, BP 115/56 mmHg O2 sat 91% on room air.  Patient was started on ceftriaxone  and metronidazole .  I added fentanyl  25 mcg IVP x 1 and Compazine  5 mg IVP x 1.  Review of Systems: As mentioned in the history of present illness. All other systems reviewed and are negative. Past Medical History:  Diagnosis Date   Aortic stenosis 04/12/2016   none noted on echo done 08/13/20   Coronary artery disease    Depression    Diverticulosis    Hearing loss 04/12/2016   Hiatal hernia 04/12/2016   HLD (hyperlipidemia) 04/12/2016   Hyperglycemia 09/14/2016   Hypertension    Idiopathic scoliosis 04/12/2016   Major depression 04/12/2016   Mitral regurgitation    Osteoporosis, senile 04/12/2016   Pernicious anemia    Raynaud's disease 04/12/2016   Scoliosis    Spinal stenosis of lumbar region 04/12/2016   Past Surgical History:  Procedure Laterality Date   APPENDECTOMY     BREAST SURGERY     broken wrist Right 2007   CARPAL TUNNEL RELEASE Left 04/04/2017   Procedure: LEFT CARPAL TUNNEL RELEASE;  Surgeon: Murrell Kuba, MD;  Location: [facility];  Service: Orthopedics;  Laterality: Left;  REG/FAB   CARPAL TUNNEL RELEASE Right 12/01/2020   Procedure: CARPAL TUNNEL RELEASE;  Surgeon:  Murrell Kuba, MD;  Location: [facility];  Service: Orthopedics;  Laterality: Right;  IV REGIONAL FOREARM BLOCK   CATARACT EXTRACTION W/ INTRAOCULAR LENS  IMPLANT, BILATERAL Bilateral 2017   COLONOSCOPY  2016   FOOT SURGERY Right    PERIPHERAL VASCULAR INTERVENTION  11/11/2019    Procedure: PERIPHERAL VASCULAR INTERVENTION;  Surgeon: Sheree Penne Bruckner, MD;  Location: [facility] INVASIVE CV LAB;  Service: Cardiovascular;;   TONSILLECTOMY     VISCERAL ANGIOGRAPHY N/A 11/11/2019   Procedure: VISCERAL ANGIOGRAPHY;  Surgeon: Sheree Penne Bruckner, MD;  Location: [facility] INVASIVE CV LAB;  Service: Cardiovascular;  Laterality: N/A;   Social History:  reports that she has never smoked. She has never been exposed to tobacco smoke. She has never used smokeless tobacco. She reports current alcohol use. She reports that she does not use drugs.  Allergies  Allergen Reactions   Sulfasalazine Other (See Comments)    Other reaction(s): Other (See Comments), headaches, headaches, headaches   Topiramate Rash   Ace Inhibitors Cough   Codeine Nausea Only   Ibandronic Acid Nausea Only   Meloxicam     Other reaction(s): GI upset, GI Upset (intolerance), Other (See Comments) unknown    Prevacid [Lansoprazole]     Chest pain   Sulfa Antibiotics Other (See Comments)    headaches   Verapamil Other (See Comments)    constipation   Zolpidem Other (See Comments)    hallucinations    Family History  Problem Relation Age of Onset   Heart disease Mother        Died 24, MR, no CAD   Heart disease Father        CHF, no CAD.SABRA    HIV Daughter 30       from bone tissue transplant    Prior to Admission medications   Medication Sig Start Date End Date Taking? Authorizing Provider  acetaminophen  (TYLENOL ) 650 MG CR tablet Take 1,300 mg by mouth 2 (two) times daily.    [provider]  Cholecalciferol (VITAMIN D3) 2000 units capsule Take 2,000 Units by mouth daily.     [provider]  denosumab  (PROLIA ) 60 MG/ML SOSY injection Inject 60 mg into the skin every 6 (six) months. 07/25/19   [provider]  ELIQUIS  2.5 MG TABS tablet Take 2.5 mg by mouth 2 (two) times daily. 02/03/20   [provider]  empagliflozin  (JARDIANCE ) 10 MG TABS tablet Take 1 tablet  (10 mg total) by mouth daily. 09/13/23   Fairy Frames, MD  furosemide  (LASIX ) 40 MG tablet Take 1 tablet (40 mg total) by mouth daily. 08/15/23   Lavona Agent, MD  ipratropium (ATROVENT ) 0.06 % nasal spray Use 2 sprays in each nostril up to three times daily before meals for sinus drainage. Patient taking differently: Place 1 spray into the nose daily. 09/30/16   Landy Rome MATSU, MD  LINZESS  145 MCG CAPS capsule Take 145 mcg by mouth daily as needed (constipation). 07/29/22   [provider]  metFORMIN (GLUCOPHAGE-XR) 500 MG 24 hr tablet Take 1,000 mg by mouth every evening. 02/01/22   [provider]  mirabegron  ER (MYRBETRIQ ) 50 MG TB24 tablet Take 50 mg by mouth daily. 08/11/21   [provider]  MULTIPLE VITAMINS-MINERALS PO Take 1 tablet by mouth daily.     [provider]  nitroGLYCERIN  (NITROSTAT ) 0.4 MG SL tablet Place 1 tablet (0.4 mg total) under the tongue every 5 (five) minutes as needed for chest pain. Place one tablet  under the tongue every 5 minutes as needed for chest pain. No more than 3 08/09/22   Lavona Agent, MD  pantoprazole  (PROTONIX ) 40 MG tablet Take 40 mg by mouth 2 (two) times daily. 12/18/19   [provider]  pindolol  (VISKEN ) 5 MG tablet Take 1/2 (one-half) tablet by mouth twice daily Patient taking differently: Take 2.5 mg by mouth 2 (two) times daily. 07/04/23   Lavona Agent, MD  potassium chloride  SA (KLOR-CON  M) 20 MEQ tablet Take 1 tablet (20 mEq total) by mouth daily. 09/13/23   Fairy Frames, MD  Probiotic Product (PROBIOTIC DAILY PO) Take 1 capsule by mouth every evening.    [provider]  rOPINIRole  (REQUIP ) 0.5 MG tablet Take 0.5 mg by mouth at bedtime. 07/13/22   [provider]  rosuvastatin  (CRESTOR ) 10 MG tablet TAKE 1 TABLET BY MOUTH AT BEDTIME Patient taking differently: Take 10 mg by mouth daily. 11/23/20   Sheree Penne Bruckner, MD  sertraline  (ZOLOFT ) 50 MG tablet Take 50 mg by  mouth daily.    [provider]  traMADol  (ULTRAM ) 50 MG tablet TAKE 1 TABLET BY MOUTH EVERY 4 HOURS Patient taking differently: Take 50 mg by mouth every 12 (twelve) hours as needed for moderate pain (pain score 4-6) or severe pain (pain score 7-10). 11/14/16   Reed, Tiffany L, DO  traZODone  (DESYREL ) 50 MG tablet Take 50 mg by mouth 2 (two) times daily. 08/08/23   [provider]    Physical Exam: Vitals:   12/04/23 1029 12/04/23 1038 12/04/23 1300  BP: (!) 115/56  (!) 99/53  Pulse: (!) 52  (!) 53  Resp: 16  19  Temp: 99.4 F (37.4 C)    TempSrc: Oral    SpO2: 91%  99%  Weight:  47.2 kg   Height:  5' 2 (1.575 m)    Physical Exam Vitals and nursing note reviewed.  Constitutional:      General: She is awake. She is not in acute distress.    Appearance: She is well-developed. She is ill-appearing.     Interventions: Nasal cannula in place.  HENT:     Head: Normocephalic.     Nose: No rhinorrhea.     Mouth/Throat:     Mouth: Mucous membranes are dry.  Eyes:     General: No scleral icterus.    Pupils: Pupils are equal, round, and reactive to light.  Neck:     Vascular: No JVD.  Cardiovascular:     Rate and Rhythm: Normal rate and regular rhythm.     Heart sounds: S1 normal and S2 normal.  Pulmonary:     Effort: Pulmonary effort is normal.     Breath sounds: Normal breath sounds. No wheezing, rhonchi or rales.  Abdominal:     General: Bowel sounds are normal. There is no distension.     Palpations: Abdomen is soft.     Tenderness: There is abdominal tenderness in the left upper quadrant and left lower quadrant. There is no right CVA tenderness or left CVA tenderness.  Musculoskeletal:     Cervical back: Neck supple.     Right lower leg: No edema.     Left lower leg: No edema.  Skin:    General: Skin is warm and dry.  Neurological:     General: No focal deficit present.     Mental Status: She is alert and oriented to person, place, and time.   Psychiatric:        Mood  and Affect: Mood normal.        Behavior: Behavior normal. Behavior is cooperative.     Data Reviewed:  Results are pending, will review when available. 07/27/23 transthoracic echocardiogram report. IMPRESSIONS:   1. Left ventricular ejection fraction, by estimation, is 55 to 60%. The  left ventricle has normal function. The left ventricle has no regional  wall motion abnormalities. Left ventricular diastolic parameters are  consistent with Grade II diastolic  dysfunction (pseudonormalization).   2. Right ventricular systolic function is normal. The right ventricular  size is mildly enlarged. There is moderately elevated pulmonary artery  systolic pressure. The estimated right ventricular systolic pressure is  46.6 mmHg.   3. Left atrial size was mildly dilated.   4. A small pericardial effusion is present.   5. The mitral valve is grossly normal. Mild mitral valve regurgitation.  No evidence of mitral stenosis.   6. Tricuspid valve regurgitation is mild to moderate.   7. The aortic valve is grossly normal. Aortic valve regurgitation is not  visualized. No aortic stenosis is present.   8. The inferior vena cava is normal in size with greater than 50%  respiratory variability, suggesting right atrial pressure of 3 mmHg.   EKG: Vent. rate 60 BPM PR interval 319 ms QRS duration 94 ms QT/QTcB 447/447 ms P-R-T axes 0 -74 60 Sinus rhythm Supraventricular bigeminy Prolonged PR interval RSR' in V1 or V2, right VCD or RVH Probable left ventricular hypertrophy Inferior infarct, old  Assessment and Plan: Principal Problem:   Acute diverticulitis Admit to PCU/inpatient. Analgesics as needed. Antiemetics as needed. Continue ceftriaxone  1 g every hours.   Continue metronidazole  500 mg IVPB q 12 hr. Follow CBC and CMP in a.m.  Active Problems:   AKI (acute kidney injury) on CKD stage II (HCC) Hold diuretic. Hold beta-blocker if hypotensive. No IVF  due to CHF. Hold diuretic. Avoid hypotension. Avoid nephrotoxins. Monitor intake and output. Monitor renal function electrolytes.    Chronic diastolic CHF (congestive heart failure) (HCC) Holding furosemide  and Farxiga due to AKI.    Essential hypertension Continue pindolol  2.5 mg p.o. twice daily.    Paroxysmal atrial fibrillation (HCC) Continue apixaban  2.5 mg p.o. twice daily. Continue beta-blocker as above for rate control.    CAD (coronary artery disease) Continue DOAC, beta-blocker and statin.    Elevated troponin Minimally elevated. Demand ischemia.    Type 2 diabetes mellitus (HCC) Hemoglobin A1c was 7.5% on 07/27/2023. Carbohydrate modified diet. CBG monitoring 4 times daily. Add on RI SS as needed. Check hemoglobin A1c.    Hyperlipidemia Continue rosuvastatin  10 mg p.o. nightly.    GERD (gastroesophageal reflux disease) Continue pantoprazole  40 mg p.o. daily.    GAD (generalized anxiety disorder)   Major depression Continue sertraline  50 mg p.o. daily. Continue mirtazapine  15 mg p.o. bedtime.    Glaucoma Follow-up with ophthalmology as an outpatient.      Macrocytic anemia Anemia due to vitamin B12 deficiency Cyanocobalamin  1000 mcg p.o. daily.    Memory loss Continue donezepil 10 mg p.o. bedtime.    Advance Care Planning:   Code Status: Full Code   Consults:   Family Communication: Her son was at bedside.  Severity of Illness: The appropriate patient status for this patient is INPATIENT. Inpatient status is judged to be reasonable and necessary in order to provide the required intensity of service to ensure the patient's safety. The patient's presenting symptoms, physical exam findings, and initial radiographic and laboratory data in the context of  their chronic comorbidities is felt to place them at high risk for further clinical deterioration. Furthermore, it is not anticipated that the patient will be medically stable for discharge from the  hospital within 2 midnights of admission.   * I certify that at the point of admission it is my clinical judgment that the patient will require inpatient hospital care spanning beyond 2 midnights from the point of admission due to high intensity of service, high risk for further deterioration and high frequency of surveillance required.*  Author: Alm Dorn Castor, MD 12/04/2023 1:36 PM  For on call review [email or web address].   This document was prepared using Dragon voice recognition software and may contain some unintended transcription errors.

## 2023-12-04 NOTE — Telephone Encounter (Signed)
 Pt's son r/s appt due to pt going to hospital for abdominal pain. Wait listed

## 2023-12-04 NOTE — ED Provider Notes (Signed)
  EMERGENCY DEPARTMENT AT [facility] Provider Note   CSN: [id number] Arrival date & time: 12/04/23  1021     History  Chief Complaint  Patient presents with   Abdominal Pain    Heather Olsen is a 89 y.o. female.  89 year old female with prior medical history as detailed below presents for evaluation.  Patient complains of left lower quadrant abdominal pain.  Pain began overnight last night.  She denies associated nausea, vomiting, diarrhea.  She denies fever.  She reports prior history of diverticulitis.  Her symptoms today are, per her report, somewhat similar to last diverticulitis exacerbation.  She denies shortness of breath or chest pain.  Triage noted mild hypoxia to 91% on room air.  She was placed on 2 L nasal cannula.  During evaluation, patient's O2 was turned off.  She did not desat.  She denied shortness of breath or other respiratory respiratory complaint.  The history is provided by the patient and medical records.       Home Medications Prior to Admission medications   Medication Sig Start Date End Date Taking? Authorizing Provider  acetaminophen  (TYLENOL ) 650 MG CR tablet Take 1,300 mg by mouth 2 (two) times daily.    [provider]  Cholecalciferol (VITAMIN D3) 2000 units capsule Take 2,000 Units by mouth daily.     [provider]  denosumab  (PROLIA ) 60 MG/ML SOSY injection Inject 60 mg into the skin every 6 (six) months. 07/25/19   [provider]  ELIQUIS  2.5 MG TABS tablet Take 2.5 mg by mouth 2 (two) times daily. 02/03/20   [provider]  empagliflozin  (JARDIANCE ) 10 MG TABS tablet Take 1 tablet (10 mg total) by mouth daily. 09/13/23   Fairy Frames, MD  furosemide  (LASIX ) 40 MG tablet Take 1 tablet (40 mg total) by mouth daily. 08/15/23   Lavona Agent, MD  ipratropium (ATROVENT ) 0.06 % nasal spray Use 2 sprays in each nostril up to three times daily before meals for sinus drainage. Patient taking  differently: Place 1 spray into the nose daily. 09/30/16   Landy Rome MATSU, MD  LINZESS  145 MCG CAPS capsule Take 145 mcg by mouth daily as needed (constipation). 07/29/22   [provider]  metFORMIN (GLUCOPHAGE-XR) 500 MG 24 hr tablet Take 1,000 mg by mouth every evening. 02/01/22   [provider]  mirabegron  ER (MYRBETRIQ ) 50 MG TB24 tablet Take 50 mg by mouth daily. 08/11/21   [provider]  MULTIPLE VITAMINS-MINERALS PO Take 1 tablet by mouth daily.     [provider]  nitroGLYCERIN  (NITROSTAT ) 0.4 MG SL tablet Place 1 tablet (0.4 mg total) under the tongue every 5 (five) minutes as needed for chest pain. Place one tablet under the tongue every 5 minutes as needed for chest pain. No more than 3 08/09/22   Lavona Agent, MD  pantoprazole  (PROTONIX ) 40 MG tablet Take 40 mg by mouth 2 (two) times daily. 12/18/19   [provider]  pindolol  (VISKEN ) 5 MG tablet Take 1/2 (one-half) tablet by mouth twice daily Patient taking differently: Take 2.5 mg by mouth 2 (two) times daily. 07/04/23   Lavona Agent, MD  potassium chloride  SA (KLOR-CON  M) 20 MEQ tablet Take 1 tablet (20 mEq total) by mouth daily. 09/13/23   Fairy Frames, MD  Probiotic Product (PROBIOTIC DAILY PO) Take 1 capsule by mouth every evening.    [provider]  rOPINIRole  (REQUIP ) 0.5 MG tablet Take 0.5 mg by mouth at bedtime.  07/13/22   [provider]  rosuvastatin  (CRESTOR ) 10 MG tablet TAKE 1 TABLET BY MOUTH AT BEDTIME Patient taking differently: Take 10 mg by mouth daily. 11/23/20   Sheree Penne Bruckner, MD  sertraline  (ZOLOFT ) 50 MG tablet Take 50 mg by mouth daily.    [provider]  traMADol  (ULTRAM ) 50 MG tablet TAKE 1 TABLET BY MOUTH EVERY 4 HOURS Patient taking differently: Take 50 mg by mouth every 12 (twelve) hours as needed for moderate pain (pain score 4-6) or severe pain (pain score 7-10). 11/14/16   Reed, Tiffany L, DO  traZODone  (DESYREL ) 50  MG tablet Take 50 mg by mouth 2 (two) times daily. 08/08/23   [provider]      Allergies    Sulfasalazine, Topiramate, Ace inhibitors, Codeine, Ibandronic acid, Meloxicam, Prevacid [lansoprazole], Sulfa antibiotics, Verapamil, and Zolpidem    Review of Systems   Review of Systems  All other systems reviewed and are negative.   Physical Exam Updated Vital Signs BP (!) 115/56   Pulse (!) 52   Temp 99.4 F (37.4 C) (Oral)   Resp 16   Ht 5' 2 (1.575 m)   Wt 47.2 kg   SpO2 91%   BMI 19.02 kg/m  Physical Exam Vitals and nursing note reviewed.  Constitutional:      General: She is not in acute distress.    Appearance: Normal appearance. She is well-developed.  HENT:     Head: Normocephalic and atraumatic.  Eyes:     Conjunctiva/sclera: Conjunctivae normal.     Pupils: Pupils are equal, round, and reactive to light.  Cardiovascular:     Rate and Rhythm: Normal rate and regular rhythm.     Heart sounds: Normal heart sounds.  Pulmonary:     Effort: Pulmonary effort is normal. No respiratory distress.     Breath sounds: Normal breath sounds.  Abdominal:     General: There is no distension.     Palpations: Abdomen is soft.     Tenderness: There is abdominal tenderness in the left lower quadrant.  Musculoskeletal:        General: No deformity. Normal range of motion.     Cervical back: Normal range of motion and neck supple.  Skin:    General: Skin is warm and dry.  Neurological:     General: No focal deficit present.     Mental Status: She is alert and oriented to person, place, and time.     ED Results / Procedures / Treatments   Labs (all labs ordered are listed, but only abnormal results are displayed) Labs Reviewed  LIPASE, BLOOD  COMPREHENSIVE METABOLIC PANEL  CBC  URINALYSIS, ROUTINE W REFLEX MICROSCOPIC  BRAIN NATRIURETIC PEPTIDE  TROPONIN I (HIGH SENSITIVITY)    EKG None  Radiology No results found.  Procedures Procedures     Medications Ordered in ED Medications - No data to display  ED Course/ Medical Decision Making/ A&P                                 Medical Decision Making Amount and/or Complexity of Data Reviewed Labs: ordered. Radiology: ordered.  Risk Prescription drug management. Decision regarding hospitalization.    Medical Screen Complete  This patient presented to the ED with complaint of abdominal pain.  This complaint involves an extensive number of treatment options. The initial differential diagnosis includes, but is not limited to, recurrent diverticulitis, obstruction,  other intra-abdominal pathology, metabolic abnormality, etc.  This presentation is: Acute, Self-Limited, Previously Undiagnosed, Uncertain Prognosis, Complicated, Systemic Symptoms, and Threat to Life/Bodily Function  Patient is presenting with complaint of abdominal pain.  Symptoms described are consistent with prior episodes of diverticulitis per patient report.  CT imaging is concerning for likely recurrent diverticulitis versus colitis.  Additionally patient with creatinine elevated above baseline.  Patient would benefit from admission for IV antibiotics and additional workup.  Hospitalist service made aware of case and will evaluate for admission.  Additional history obtained:  External records from outside sources obtained and reviewed including prior ED visits and prior Inpatient records.    Lab Tests:  I ordered and personally interpreted labs.  The pertinent results include: CBC, CMP, troponin, BNP, UA   Imaging Studies ordered:  I ordered imaging studies including CT abdomen pelvis  I agree with the radiologist interpretation.   Cardiac Monitoring:  The patient was maintained on a cardiac monitor.  I personally viewed and interpreted the cardiac monitor which showed an underlying rhythm of: NSR   Medicines ordered:  I ordered medication including Rocephin , Flagyl  for suspected  diverticulitis Reevaluation of the patient after these medicines showed that the patient: improved  Problem List / ED Course:  Abdominal pain, AKI   Reevaluation:  After the interventions noted above, I reevaluated the patient and found that they have: improved   Disposition:  After consideration of the diagnostic results and the patients response to treatment, I feel that the patent would benefit from close outpatient followup.          Final Clinical Impression(s) / ED Diagnoses Final diagnoses:  Left lower quadrant abdominal pain  AKI (acute kidney injury) (HCC)    Rx / DC Orders ED Discharge Orders     None         Laurice Maude BROCKS, MD 12/04/23 1434

## 2023-12-05 DIAGNOSIS — I480 Paroxysmal atrial fibrillation: Secondary | ICD-10-CM

## 2023-12-05 DIAGNOSIS — N1831 Chronic kidney disease, stage 3a: Secondary | ICD-10-CM

## 2023-12-05 DIAGNOSIS — N179 Acute kidney failure, unspecified: Secondary | ICD-10-CM

## 2023-12-05 DIAGNOSIS — K5792 Diverticulitis of intestine, part unspecified, without perforation or abscess without bleeding: Secondary | ICD-10-CM | POA: Diagnosis not present

## 2023-12-05 DIAGNOSIS — E1122 Type 2 diabetes mellitus with diabetic chronic kidney disease: Secondary | ICD-10-CM

## 2023-12-05 LAB — CBG MONITORING, ED
Glucose-Capillary: 108 mg/dL — ABNORMAL HIGH (ref 70–99)
Glucose-Capillary: 202 mg/dL — ABNORMAL HIGH (ref 70–99)

## 2023-12-05 LAB — COMPREHENSIVE METABOLIC PANEL
ALT: 14 U/L (ref 0–44)
AST: 23 U/L (ref 15–41)
Albumin: 3 g/dL — ABNORMAL LOW (ref 3.5–5.0)
Alkaline Phosphatase: 61 U/L (ref 38–126)
Anion gap: 10 (ref 5–15)
BUN: 32 mg/dL — ABNORMAL HIGH (ref 8–23)
CO2: 27 mmol/L (ref 22–32)
Calcium: 8.5 mg/dL — ABNORMAL LOW (ref 8.9–10.3)
Chloride: 92 mmol/L — ABNORMAL LOW (ref 98–111)
Creatinine, Ser: 1.43 mg/dL — ABNORMAL HIGH (ref 0.44–1.00)
GFR, Estimated: 35 mL/min — ABNORMAL LOW (ref >60)
Glucose, Bld: 112 mg/dL — ABNORMAL HIGH (ref 70–99)
Potassium: 3 mmol/L — ABNORMAL LOW (ref 3.5–5.1)
Sodium: 129 mmol/L — ABNORMAL LOW (ref 135–145)
Total Bilirubin: 0.6 mg/dL (ref 0.0–1.2)
Total Protein: 5.9 g/dL — ABNORMAL LOW (ref 6.5–8.1)

## 2023-12-05 LAB — CBC
HCT: 30.8 % — ABNORMAL LOW (ref 36.0–46.0)
Hemoglobin: 9.9 g/dL — ABNORMAL LOW (ref 12.0–15.0)
MCH: 33.3 pg (ref 26.0–34.0)
MCHC: 32.1 g/dL (ref 30.0–36.0)
MCV: 103.7 fL — ABNORMAL HIGH (ref 80.0–100.0)
Platelets: 321 10*3/uL (ref 150–400)
RBC: 2.97 MIL/uL — ABNORMAL LOW (ref 3.87–5.11)
RDW: 13.4 % (ref 11.5–15.5)
WBC: 7.8 10*3/uL (ref 4.0–10.5)
nRBC: 0 % (ref 0.0–0.2)

## 2023-12-05 LAB — GLUCOSE, CAPILLARY
Glucose-Capillary: 160 mg/dL — ABNORMAL HIGH (ref 70–99)
Glucose-Capillary: 185 mg/dL — ABNORMAL HIGH (ref 70–99)

## 2023-12-05 MED ORDER — INSULIN ASPART 100 UNIT/ML IJ SOLN
0.0000 [IU] | Freq: Three times a day (TID) | INTRAMUSCULAR | Status: DC
  Administered 2023-12-05: 1 [IU] via SUBCUTANEOUS
  Administered 2023-12-05: 2 [IU] via SUBCUTANEOUS
  Filled 2023-12-05: qty 0.06

## 2023-12-05 MED ORDER — LACTATED RINGERS IV SOLN: INTRAVENOUS | Status: AC

## 2023-12-05 NOTE — Progress Notes (Signed)
 Progress Note   Patient: Heather Olsen FMW:[id number] DOB: 06/27/1931 DOA: 12/04/2023     1 DOS: the patient was seen and examined on 12/05/2023   Brief hospital course: 89 year old woman, presented with left lower quadrant pain.  Admitted for acute diverticulitis.  Consultants None   Procedures/Events None   Assessment and Plan: Acute diverticulitis without complicating features Afebrile, vital signs stable.  Leukocytosis trending down.  Still has significant pain but exam is nonsurgical. Continue empiric antibiotics.  Downgrade to MedSurg bed.   AKI CKD stage IIIa Baseline creatinine around 1.0, CKD stage IIIa Likely secondary to poor oral intake complicated by diuretic. IV fluids.  Trend BMP.   Chronic diastolic dysfunction Appears stable.  Hold furosemide  and Farxiga due to AKI.   Essential hypertension Continue pindolol     Paroxysmal atrial fibrillation (HCC) Continue apixaban  2.5 mg p.o. twice daily. Continue beta-blocker as above for rate control.   CAD (coronary artery disease) Stable.  Continue DOAC, beta-blocker and statin.   Elevated troponin Trivial.  Demand ischemia ruled out.   Type 2 diabetes mellitus (HCC) Hemoglobin A1c was 7.5% on 07/27/2023. CBG stable.  Sliding scale insulin .  Hold metformin.   GERD  Continue pantoprazole     GAD (generalized anxiety disorder) Major depression Continue sertraline  Continue mirtazapine         Macrocytic anemia Anemia due to vitamin B12 deficiency Cyanocobalamin  1000 mcg p.o. daily. Stable.     Memory loss Continue donezepil 10 mg p.o. bedtime.  Disposition/mobility Anticipate return to independent living when improved. Mobilize aggressively per standard protocol.     Subjective:   Unfortunately pain is worse left lower quadrant today.  No nausea or vomiting.  Tolerating liquids.  Has had diverticulitis before.  Physical Exam: Vitals:   12/05/23 0653 12/05/23 0700 12/05/23 1030 12/05/23 1102  BP: (!)  141/56 (!) 105/56 138/65   Pulse: 63 (!) 55 62   Resp: 15 16 16    Temp: 97.8 F (36.6 C)   98.3 F (36.8 C)  TempSrc: Oral   Oral  SpO2: 97% 98% 98%   Weight:      Height:       Physical Exam Vitals reviewed.  Constitutional:      General: She is not in acute distress.    Appearance: She is not ill-appearing or toxic-appearing.  Cardiovascular:     Rate and Rhythm: Normal rate and regular rhythm.     Heart sounds: No murmur heard. Pulmonary:     Effort: Pulmonary effort is normal. No respiratory distress.     Breath sounds: No wheezing, rhonchi or rales.  Abdominal:     General: There is no distension.     Palpations: Abdomen is soft.     Tenderness: There is abdominal tenderness (LLQ). There is no guarding.  Neurological:     Mental Status: She is alert.  Psychiatric:        Mood and Affect: Mood normal.        Behavior: Behavior normal.     Data Reviewed: CBG stable Sodium stable at 129 which appears to be chronic Potassium 3.0 will replete Creatinine improving, 1.43, LFTs unremarkable.  Lipase normal on admission. Leukocytosis resolved.  Hemoglobin stable at 9.9. CT without complicating features   Family Communication: none  Disposition: Status is: Inpatient Remains inpatient appropriate because: acute diverticulitis  Planned Discharge Destination: Home / ILF [facility]    Time spent: [age] year old woman presented with left-sided abdominal pain.  Admitted for acute diverticulitis.  Minutes  Chartered Loss Adjuster:  Toribio Door, MD 12/05/2023 11:10 AM  For on call review [email or web address].

## 2023-12-05 NOTE — Plan of Care (Signed)
 Patient stable and doing well after admission.  Son at bedside.

## 2023-12-05 NOTE — Hospital Course (Addendum)
 Heather Olsen is 89 y.o. female with hypertension, pulmonary hypertension, CKD stage II, paroxysmal A-fib on Eliquis , hyperlipidemia, type 2 diabetes, stress incontinence, heart failure, CAD, cervical spondylosis, history of diverticulitis, who presents with left lower quadrant pain and was admitted for acute diverticulitis.  WBC on arrival was 11.3.  CT scan consistent with inflammation along the distal descending colon as well as sigmoid colon, though no abscess extraluminal gas, or pneumatosis was appreciated.  Patient was initiated on ceftriaxone  and Flagyl .  WBC improved to normal.   Patient stay was also complicated by AKI on arrival.  Baseline creatinine close to 1 though on arrival was 1.9 with elevated BUN.  Patient is taking Lasix  daily.  Blood pressure was low to normotensive throughout her stay.  At this time patient appears dehydrated and blood pressure unlikely to tolerate Lasix .  It was held during her admission and creatinine resolved to normal with the assistance of IVF.  We recommend patient hold this medication until following up with her primary care physician to discuss.  On chart review it appears her EF is 55 to 60% though she does have some grade 2 diastolic dysfunction.  Likely Lasix  can be taken on a PRN basis moving forward.   On reevaluation on 1/8 patient was tolerating her diet, still had some persistent left lower quadrant pain but overall improved.  She endorses feeling ready for discharge.  She will be discharging home today with an additional 7 days of renally dosed Ciprofloxacin  and Flagyl  to treat her diverticulitis (endorses prior reaction to Augmentin , also has sulfa allergy).

## 2023-12-06 DIAGNOSIS — K5792 Diverticulitis of intestine, part unspecified, without perforation or abscess without bleeding: Secondary | ICD-10-CM | POA: Diagnosis not present

## 2023-12-06 LAB — CBC
HCT: 35.3 % — ABNORMAL LOW (ref 36.0–46.0)
Hemoglobin: 11.3 g/dL — ABNORMAL LOW (ref 12.0–15.0)
MCH: 33.5 pg (ref 26.0–34.0)
MCHC: 32 g/dL (ref 30.0–36.0)
MCV: 104.7 fL — ABNORMAL HIGH (ref 80.0–100.0)
Platelets: 393 10*3/uL (ref 150–400)
RBC: 3.37 MIL/uL — ABNORMAL LOW (ref 3.87–5.11)
RDW: 13.5 % (ref 11.5–15.5)
WBC: 7 10*3/uL (ref 4.0–10.5)
nRBC: 0 % (ref 0.0–0.2)

## 2023-12-06 LAB — BASIC METABOLIC PANEL
Anion gap: 12 (ref 5–15)
BUN: 23 mg/dL (ref 8–23)
CO2: 28 mmol/L (ref 22–32)
Calcium: 9.3 mg/dL (ref 8.9–10.3)
Chloride: 94 mmol/L — ABNORMAL LOW (ref 98–111)
Creatinine, Ser: 1 mg/dL (ref 0.44–1.00)
GFR, Estimated: 54 mL/min — ABNORMAL LOW (ref >60)
Glucose, Bld: 130 mg/dL — ABNORMAL HIGH (ref 70–99)
Potassium: 3.3 mmol/L — ABNORMAL LOW (ref 3.5–5.1)
Sodium: 134 mmol/L — ABNORMAL LOW (ref 135–145)

## 2023-12-06 LAB — GLUCOSE, CAPILLARY
Glucose-Capillary: 136 mg/dL — ABNORMAL HIGH (ref 70–99)
Glucose-Capillary: 231 mg/dL — ABNORMAL HIGH (ref 70–99)

## 2023-12-06 MED ORDER — CIPROFLOXACIN HCL 500 MG PO TABS: 500.0000 mg | ORAL_TABLET | Freq: Two times a day (BID) | ORAL | 0 refills | Status: DC

## 2023-12-06 MED ORDER — POTASSIUM CHLORIDE CRYS ER 20 MEQ PO TBCR
40.0000 meq | EXTENDED_RELEASE_TABLET | Freq: Once | ORAL | Status: AC
  Administered 2023-12-06: 40 meq via ORAL
  Filled 2023-12-06: qty 2

## 2023-12-06 MED ORDER — METRONIDAZOLE 500 MG PO TABS: 500.0000 mg | ORAL_TABLET | Freq: Three times a day (TID) | ORAL | 0 refills | Status: AC

## 2023-12-06 MED ORDER — CIPROFLOXACIN HCL 500 MG PO TABS: 500.0000 mg | ORAL_TABLET | Freq: Every day | ORAL | 0 refills | Status: AC

## 2023-12-06 NOTE — Progress Notes (Signed)
 Patient discharged via wheelchair with son at side. Patient had all belongings clothes, cell phone, and hearing aids. Patient was in no distress.

## 2023-12-06 NOTE — Progress Notes (Signed)
 Patient is alert and oriented x3 but forgetful at times. Son was at bedside at shift start. Received oxy for abdominal pain. Blood glucose was 185 last night. Denied additional needs.

## 2023-12-06 NOTE — TOC CM/SW Note (Signed)
 Transition of Care [facility]) - Inpatient Brief Assessment   Patient Details  Name: Heather Olsen MRN: [id number] Date of Birth: 1931-07-13  Transition of Care [facility]) CM/SW Contact:    Dalila Camellia SAUNDERS, LCSW Phone Number: 12/06/2023, 12:27 PM   Clinical Narrative:  Patient has insurance and a PCP.  Patient does not have any SDOH needs, no anticipated TOC needs at this time.   Transition of Care Asessment: Insurance and Status: Insurance coverage has been reviewed Patient has primary care physician: Yes Home environment has been reviewed: Yes from [facility] Prior level of function:: Indep Prior/Current Home Services: No current home services Social Drivers of Health Review: SDOH reviewed no interventions necessary Readmission risk has been reviewed: Yes Transition of care needs: no transition of care needs at this time

## 2023-12-06 NOTE — Consult Note (Signed)
 [facility] Liaison Consult Note   12/06/2023  Heather Olsen [birth year] [id number]  Insurance: Medicare ACO [place]    Primary Care Provider: Clarice Nottingham, MD with [facility], this provider is listed for the transition of care follow up appointments.  RN Hospital Liaison screened the patient remotely at [facility].    The patient was screened for noted high risk score for unplanned readmission risk 3 hospital admissions in 6 months.  The patient was assessed for potential Community Care Coordination service needs for post hospital transition for care coordination. Review of patient's electronic medical record reveals patient is returning to [facility] ILF. Reviewed for needs and no needs noted SDOH intact..   Plan: [facility] Liaison will continue to follow progress and disposition to asess for post hospital community care coordination/management needs.  Referral request for community care coordination: anticipate follow up Community TOC calls.   [facility] Community Care, Population Health does not replace or interfere with any arrangements made by the Inpatient Transition of Care team.   For questions contact:   Richerd Fish, RN, BSN, CCM [place]  [facility], [facility] Liaison Direct Dial: [phone] or secure chat Email: [email or web address]

## 2023-12-06 NOTE — Discharge Summary (Signed)
 Physician Discharge Summary   Patient: Heather Olsen MRN: [id number] DOB: 1931-09-03  Admit date:     12/04/2023  Discharge date: 12/06/23  Discharge Physician: Lorane Poland   PCP: Clarice Nottingham, MD   Recommendations at discharge:    Follow up with your primary care physician in 1-2 weeks to discuss your chronic medication management and medication changes during this hospital stay.   Discharge Diagnoses: Principal Problem:   Acute diverticulitis Active Problems:   Chronic diastolic CHF (congestive heart failure) (HCC)   AKI (acute kidney injury) on CKD stage II (HCC)   Essential hypertension   Hyperlipidemia   Paroxysmal atrial fibrillation (HCC)   Type 2 diabetes mellitus (HCC)   GERD (gastroesophageal reflux disease)   CAD (coronary artery disease)   Major depression   Glaucoma   Anemia due to vitamin B12 deficiency   Memory loss   GAD (generalized anxiety disorder)   Elevated troponin   Macrocytic anemia  Resolved Problems:   * No resolved hospital problems. [facility] Course: Heather Olsen is 89 y.o. female with hypertension, pulmonary hypertension, CKD stage II, paroxysmal A-fib on Eliquis , hyperlipidemia, type 2 diabetes, stress incontinence, heart failure, CAD, cervical spondylosis, history of diverticulitis, who presents with left lower quadrant pain and was admitted for acute diverticulitis.  WBC on arrival was 11.3.  CT scan consistent with inflammation along the distal descending colon as well as sigmoid colon, though no abscess extraluminal gas, or pneumatosis was appreciated.  Patient was initiated on ceftriaxone  and Flagyl .  WBC improved to normal.   Patient stay was also complicated by AKI on arrival.  Baseline creatinine close to 1 though on arrival was 1.9 with elevated BUN.  Patient is taking Lasix  daily.  Blood pressure was low to normotensive throughout her stay.  At this time patient appears dehydrated and blood pressure unlikely to tolerate Lasix .  It was  held during her admission and creatinine resolved to normal with the assistance of IVF.  We recommend patient hold this medication until following up with her primary care physician to discuss.  On chart review it appears her EF is 55 to 60% though she does have some grade 2 diastolic dysfunction.  Likely Lasix  can be taken on a PRN basis moving forward.   On reevaluation on 1/8 patient was tolerating her diet, still had some persistent left lower quadrant pain but overall improved.  She endorses feeling ready for discharge.  She will be discharging home today with an additional 7 days of renally dosed Ciprofloxacin  and Flagyl  to treat her diverticulitis (endorses prior reaction to Augmentin , also has sulfa allergy).        Consultants: n/a Procedures performed: n/a  Disposition: Home Diet recommendation:  Discharge Diet Orders (From admission, onward)     Start     Ordered   12/06/23 0000  Diet - low sodium heart healthy        12/06/23 1125           Regular diet DISCHARGE MEDICATION: Allergies as of 12/06/2023       Reactions   Sulfasalazine Other (See Comments)   Other reaction(s): Other (See Comments), headaches, headaches, headaches   Topiramate Rash   Ace Inhibitors Cough   Codeine Nausea Only   Ibandronic Acid Nausea Only   Meloxicam    Other reaction(s): GI upset, GI Upset (intolerance), Other (See Comments) unknown   Prevacid [lansoprazole]    Chest pain   Sulfa Antibiotics Other (See Comments)   headaches  Verapamil Other (See Comments)   constipation   Zolpidem Other (See Comments)   hallucinations        Medication List     PAUSE taking these medications    furosemide  40 MG tablet Wait to take this until your doctor or other care provider tells you to start again. Commonly known as: LASIX  Take 1 tablet (40 mg total) by mouth daily.   potassium chloride  SA 20 MEQ tablet Wait to take this until your doctor or other care provider tells you to start  again. Commonly known as: KLOR-CON  M Take 1 tablet (20 mEq total) by mouth daily.       STOP taking these medications    nitrofurantoin (macrocrystal-monohydrate) 100 MG capsule Commonly known as: MACROBID   traZODone  50 MG tablet Commonly known as: DESYREL        TAKE these medications    acetaminophen  650 MG CR tablet Commonly known as: TYLENOL  Take 650 mg by mouth daily as needed for pain.   ciprofloxacin  500 MG tablet Commonly known as: CIPRO  Take 1 tablet (500 mg total) by mouth daily with breakfast for 7 days.   donepezil  5 MG tablet Commonly known as: ARICEPT  Take 5 mg by mouth at bedtime.   Eliquis  2.5 MG Tabs tablet Generic drug: apixaban  Take 2.5 mg by mouth 2 (two) times daily.   ipratropium 0.06 % nasal spray Commonly known as: ATROVENT  Use 2 sprays in each nostril up to three times daily before meals for sinus drainage. What changed:  how much to take how to take this when to take this reasons to take this additional instructions   Jardiance  10 MG Tabs tablet Generic drug: empagliflozin  Take 1 tablet (10 mg total) by mouth daily.   Linzess  145 MCG Caps capsule Generic drug: linaclotide  Take 145 mcg by mouth daily as needed (constipation).   metFORMIN 500 MG 24 hr tablet Commonly known as: GLUCOPHAGE-XR Take 1,000 mg by mouth at bedtime.   metroNIDAZOLE  500 MG tablet Commonly known as: Flagyl  Take 1 tablet (500 mg total) by mouth 3 (three) times daily for 7 days.   mirabegron  ER 50 MG Tb24 tablet Commonly known as: MYRBETRIQ  Take 50 mg by mouth at bedtime.   mirtazapine  15 MG tablet Commonly known as: REMERON  Take 15 mg by mouth at bedtime.   MULTIPLE VITAMINS-MINERALS PO Take 1 tablet by mouth daily.   nitroGLYCERIN  0.4 MG SL tablet Commonly known as: NITROSTAT  Place 1 tablet (0.4 mg total) under the tongue every 5 (five) minutes as needed for chest pain. Place one tablet under the tongue every 5 minutes as needed for chest pain.  No more than 3   pantoprazole  40 MG tablet Commonly known as: PROTONIX  Take 40 mg by mouth 2 (two) times daily.   pindolol  5 MG tablet Commonly known as: VISKEN  Take 1/2 (one-half) tablet by mouth twice daily What changed: See the new instructions.   PROBIOTIC DAILY PO Take 1 capsule by mouth at bedtime.   Prolia  60 MG/ML Sosy injection Generic drug: denosumab  Inject 60 mg into the skin every 6 (six) months.   rOPINIRole  0.5 MG tablet Commonly known as: REQUIP  Take 0.5 mg by mouth at bedtime.   rosuvastatin  10 MG tablet Commonly known as: CRESTOR  TAKE 1 TABLET BY MOUTH AT BEDTIME   sertraline  50 MG tablet Commonly known as: ZOLOFT  Take 50 mg by mouth daily.   traMADol  50 MG tablet Commonly known as: ULTRAM  TAKE 1 TABLET BY MOUTH EVERY 4 HOURS What  changed: See the new instructions.   VITAMIN D PO Take 1 capsule by mouth at bedtime.        Discharge Exam: Filed Weights   12/04/23 1038 12/05/23 1358  Weight: 47.2 kg 48.6 kg   Constitutional:  Normal appearance. Non toxic-appearing.  HENT: Head Normocephalic and atraumatic.  Mucous membranes are moist.  Eyes:  Extraocular intact. Conjunctivae normal. Pupils are equal, round, and reactive to light.  Cardiovascular: Rate and Rhythm: Normal rate and regular rhythm.  Pulmonary: Non labored, symmetric rise of chest wall.  Musculoskeletal:  Normal range of motion.  Abdomen: tender to palpation in LLQ  Skin: warm and dry. not jaundiced.  Neurological: No focal deficit present. alert. Oriented. Psychiatric: Mood and Affect congruent.    Condition at discharge: stable  The results of significant diagnostics from this hospitalization (including imaging, microbiology, ancillary and laboratory) are listed below for reference.   Imaging Studies: CT ABDOMEN PELVIS WO CONTRAST Result Date: 12/04/2023 CLINICAL DATA:  Left lower quadrant abdominal pain EXAM: CT ABDOMEN AND PELVIS WITHOUT CONTRAST TECHNIQUE: Multidetector  CT imaging of the abdomen and pelvis was performed following the standard protocol without IV contrast. RADIATION DOSE REDUCTION: This exam was performed according to the departmental dose-optimization program which includes automated exposure control, adjustment of the mA and/or kV according to patient size and/or use of iterative reconstruction technique. COMPARISON:  08/10/2023 FINDINGS: Lower chest: Moderate cardiomegaly. Descending thoracic aortic atherosclerosis. Mild scarring or atelectasis in the left lower lobe and lingula. Hepatobiliary: Unremarkable Pancreas: Atrophic pancreas. Spleen: Punctate calcifications compatible with old granulomatous disease. Adrenals/Urinary Tract: Unremarkable Stomach/Bowel: Substantial abnormal inflammation along a 8 cm segment of the distal descending colon with abnormal wall thickening, edema in the mesocolon, and scattered diverticula. This could be from localized colitis or diverticulitis, with the latter favored. There is also sigmoid colon diverticulosis along with substantial focal wall thickening in a 4.6 cm segment of the sigmoid colon shown on image 47 series 2, favoring a second site of diverticulitis or colitis. Tumor unlikely given that this segment was not thickened on 08/10/2023. No discrete extraluminal gas or drainable abscess. Vascular/Lymphatic: Atherosclerosis is present, including aortoiliac atherosclerotic disease. Reproductive: Unremarkable Other: No supplemental non-categorized findings. Musculoskeletal: Dextroconvex thoracic and levoconvex lumbar scoliosis with substantial rotary component. Grade 1 anterolisthesis at L4-5 and L5-S1 Prominent degenerative arthropathy of both hips with associated chondrocalcinosis. IMPRESSION: 1. Two segments of inflamed colon, one along the distal descending colon and one along the proximal sigmoid colon, favoring multifocal diverticulitis or multifocal colitis. No extraluminal gas, pneumatosis, or drainable abscess  observed. 2. Moderate cardiomegaly. 3. Aortic and systemic atherosclerosis. 4. Dextroconvex thoracic and levoconvex lumbar scoliosis with substantial rotary component. Grade 1 anterolisthesis at L4-5 and L5-S1. 5. Prominent degenerative arthropathy of both hips with associated chondrocalcinosis. Aortic Atherosclerosis (ICD10-I70.0). Electronically Signed   By: Ryan Salvage M.D.   On: 12/04/2023 12:11   DG Chest Port 1 View Result Date: 12/04/2023 CLINICAL DATA:  Shortness of breath EXAM: PORTABLE CHEST 1 VIEW COMPARISON:  09/11/2023 FINDINGS: Moderate enlargement of the cardiopericardial silhouette. Atherosclerotic calcification of the aortic arch. Suspected scarring or atelectasis at the left lung base. Substantial dextroconvex thoracic scoliosis. No blunting of the costophrenic angles. IMPRESSION: 1. Moderate enlargement of the cardiopericardial silhouette. 2. Suspected scarring or atelectasis at the left lung base. 3. Substantial dextroconvex thoracic scoliosis. Electronically Signed   By: Ryan Salvage M.D.   On: 12/04/2023 12:04    Microbiology: Results for orders placed or performed during the hospital encounter  of 08/10/23  SARS Coronavirus 2 by RT PCR (hospital order, performed in [facility] hospital lab) *cepheid single result test* Anterior Nasal Swab     Status: None   Collection Time: 08/10/23  5:20 PM   Specimen: Anterior Nasal Swab  Result Value Ref Range Status   SARS Coronavirus 2 by RT PCR NEGATIVE NEGATIVE Final    Comment: (NOTE) SARS-CoV-2 target nucleic acids are NOT DETECTED.  The SARS-CoV-2 RNA is generally detectable in upper and lower respiratory specimens during the acute phase of infection. The lowest concentration of SARS-CoV-2 viral copies this assay can detect is 250 copies / mL. A negative result does not preclude SARS-CoV-2 infection and should not be used as the sole basis for treatment or other patient management decisions.  A negative result may  occur with improper specimen collection / handling, submission of specimen other than nasopharyngeal swab, presence of viral mutation(s) within the areas targeted by this assay, and inadequate number of viral copies (<250 copies / mL). A negative result must be combined with clinical observations, patient history, and epidemiological information.  Fact Sheet for Patients:   roadlaptop.co.za  Fact Sheet for Healthcare Providers: http://kim-miller.com/  This test is not yet approved or  cleared by the [place]  FDA and has been authorized for detection and/or diagnosis of SARS-CoV-2 by FDA under an Emergency Use Authorization (EUA).  This EUA will remain in effect (meaning this test can be used) for the duration of the COVID-19 declaration under Section 564(b)(1) of the Act, 21 U.S.C. section 360bbb-3(b)(1), unless the authorization is terminated or revoked sooner.  Performed at [facility], [address] [place], [place] [address]     Labs: CBC: Recent Labs  Lab 12/04/23 1100 12/05/23 0532 12/06/23 0439  WBC 11.3* 7.8 7.0  HGB 11.1* 9.9* 11.3*  HCT 33.3* 30.8* 35.3*  MCV 103.4* 103.7* 104.7*  PLT 304 321 393   Basic Metabolic Panel: Recent Labs  Lab 12/04/23 1100 12/05/23 0532 12/06/23 0439  NA 128* 129* 134*  K 3.6 3.0* 3.3*  CL 88* 92* 94*  CO2 24 27 28   GLUCOSE 113* 112* 130*  BUN 35* 32* 23  CREATININE 1.90* 1.43* 1.00  CALCIUM  9.0 8.5* 9.3   Liver Function Tests: Recent Labs  Lab 12/04/23 1100 12/05/23 0532  AST 31 23  ALT 16 14  ALKPHOS 65 61  BILITOT 1.0 0.6  PROT 6.8 5.9*  ALBUMIN 3.4* 3.0*   CBG: Recent Labs  Lab 12/05/23 1206 12/05/23 1737 12/05/23 2139 12/06/23 0754 12/06/23 1256  GLUCAP 202* 160* 185* 136* 231*    Discharge time spent: greater than 30 minutes.  Signed: Keviana Guida, DO [facility] 12/06/2023

## 2023-12-06 NOTE — Plan of Care (Signed)

## 2023-12-07 ENCOUNTER — Telehealth: Payer: Self-pay

## 2023-12-07 NOTE — Transitions of Care (Post Inpatient/ED Visit) (Signed)
   12/07/2023  Name: Heather Olsen MRN: [id number] DOB: 1930/12/03  Today's TOC FU Call Status: Today's TOC FU Call Status:: Unsuccessful Call (1st Attempt) Unsuccessful Call (1st Attempt) Date: 12/07/23  Attempted to reach the patient regarding the most recent Inpatient/ED visit.  Follow Up Plan: Additional outreach attempts will be made to reach the patient to complete the Transitions of Care (Post Inpatient/ED visit) call.   Gara Kincade Gladis BSN, Programmer, Systems / Transitions of Care [place] / [facility], [facility] Direct Dial Number:  [phone]

## 2023-12-12 ENCOUNTER — Telehealth: Payer: Self-pay | Admitting: *Deleted

## 2023-12-12 DIAGNOSIS — I10 Essential (primary) hypertension: Secondary | ICD-10-CM | POA: Diagnosis not present

## 2023-12-12 NOTE — Transitions of Care (Post Inpatient/ED Visit) (Signed)
 12/12/2023  Name: Heather Olsen MRN: [id number] DOB: [birth year]  Today's TOC FU Call Status: Today's TOC FU Call Status:: Successful TOC FU Call Completed TOC FU Call Complete Date: 12/12/23 Patient's Name and Date of Birth confirmed.  Transition Care Management Follow-up Telephone Call Date of Discharge: 12/06/23 Discharge Facility: [facility]) Type of Discharge: Inpatient Admission Primary Inpatient Discharge Diagnosis:: Acute Diverticulitis How have you been since you were released from the hospital?: Better Any questions or concerns?: No  Items Reviewed: Medications obtained,verified, and reconciled?: Yes (Medications Reviewed) Any new allergies since your discharge?: No Dietary orders reviewed?: No Do you have support at home?: Yes People in Home: alone Name of Support/Comfort Primary Source: Rick  Medications Reviewed Today: Medications Reviewed Today     Reviewed by Kennieth Cathlean DEL, RN (Case Manager) on 12/12/23 at 1212  Med List Status: <None>   Medication Order Taking? Sig Documenting Provider Last Dose Status Informant  acetaminophen  (TYLENOL ) 650 MG CR tablet [id number] Yes Take 650 mg by mouth daily as needed for pain. [provider] Taking Active Self, Pharmacy Records           Med Note (CRUTHIS, CHLOE C   Mon Dec 04, 2023  1:46 PM)    ciprofloxacin  (CIPRO ) 500 MG tablet [id number] Yes Take 1 tablet (500 mg total) by mouth daily with breakfast for 7 days. Dezii, Alexandra, DO Taking Active   denosumab  (PROLIA ) 60 MG/ML SOSY injection [id number] No Inject 60 mg into the skin every 6 (six) months.  Patient not taking: Reported on 12/12/2023   [provider] Not Taking Active Self, Pharmacy Records           Med Note (CRUTHIS, CHLOE C   Mon Dec 04, 2023  2:17 PM) Pt is unsure of when this was last injected   donepezil  (ARICEPT ) 5 MG tablet [id number] Yes Take 5 mg by mouth at bedtime. [provider] Taking Active Self, Pharmacy Records   ELIQUIS  2.5 MG TABS tablet [id number] Yes Take 2.5 mg by mouth 2 (two) times daily. [provider] Taking Active Self, Pharmacy Records  empagliflozin  (JARDIANCE ) 10 MG TABS tablet [id number]  Take 1 tablet (10 mg total) by mouth daily. Fairy Frames, MD  Active Self, Pharmacy Records  furosemide  (LASIX ) 40 MG tablet [id number]  Take 1 tablet (40 mg total) by mouth daily. Lavona Agent, MD  Active Self, Pharmacy Records  ipratropium (ATROVENT ) 0.06 % nasal spray [id number] Yes Use 2 sprays in each nostril up to three times daily before meals for sinus drainage.  Patient taking differently: Place 2 sprays into the nose daily as needed for rhinitis.   Landy Rome MATSU, MD Taking Active Self, Pharmacy Records  LINZESS  145 MCG CAPS capsule [id number] Yes Take 145 mcg by mouth daily as needed (constipation). [provider] Taking Active Self, Pharmacy Records           Med Note (CRUTHIS, CHLOE C   Mon Dec 04, 2023  2:17 PM) Pt is unsure of last dose.   metFORMIN (GLUCOPHAGE-XR) 500 MG 24 hr tablet [id number] Yes Take 1,000 mg by mouth at bedtime. [provider] Taking Active Self, Pharmacy Records  metroNIDAZOLE  (FLAGYL ) 500 MG tablet [id number] Yes Take 1 tablet (500 mg total) by mouth 3 (three) times daily for 7 days. Dezii, Alexandra, DO Taking Active   mirabegron  ER (MYRBETRIQ ) 50 MG TB24 tablet [id number] Yes Take 50 mg by mouth at bedtime. [provider] Taking Active Self, Pharmacy Records  Med Note (CRUTHIS, CHLOE C   Mon Dec 04, 2023  2:18 PM) Pt is adamant she is still taking this medication nightly. Dispense report does not support this claim.   mirtazapine  (REMERON ) 15 MG tablet [id number] Yes Take 15 mg by mouth at bedtime. [provider] Taking Active Self, Pharmacy Records  MULTIPLE VITAMINS-MINERALS PO [id number] Yes Take 1 tablet by mouth daily.  [provider] Taking Active Self, Pharmacy Records           Med Note ANNELLA,  MESHELL A   Tue Jun 21, 2016  2:53 PM)     nitroGLYCERIN  (NITROSTAT ) 0.4 MG SL tablet [id number] Yes Place 1 tablet (0.4 mg total) under the tongue every 5 (five) minutes as needed for chest pain. Place one tablet under the tongue every 5 minutes as needed for chest pain. No more than 3 Hochrein, Lynwood, MD Taking Active Self, Pharmacy Records           Med Note (CRUTHIS, SHEFFIELD JAYSON Kitchens Dec 04, 2023  2:18 PM) Last dose is unknown   pantoprazole  (PROTONIX ) 40 MG tablet [id number] Yes Take 40 mg by mouth 2 (two) times daily. [provider] Taking Active Self, Pharmacy Records  pindolol  (VISKEN ) 5 MG tablet [id number] Yes Take 1/2 (one-half) tablet by mouth twice daily  Patient taking differently: Take 2.5 mg by mouth 2 (two) times daily.   Lavona Lynwood, MD Taking Active Self, Pharmacy Records  potassium chloride  SA (KLOR-CON  M) 20 MEQ tablet [id number] Yes Take 1 tablet (20 mEq total) by mouth daily. Fairy Frames, MD Taking Active Self, Pharmacy Records  Probiotic Product (PROBIOTIC DAILY PO) [id number] Yes Take 1 capsule by mouth at bedtime. [provider] Taking Active Self, Pharmacy Records  rOPINIRole  (REQUIP ) 0.5 MG tablet [id number] Yes Take 0.5 mg by mouth at bedtime. [provider] Taking Active Self, Pharmacy Records  rosuvastatin  (CRESTOR ) 10 MG tablet [id number] Yes TAKE 1 TABLET BY MOUTH AT BEDTIME  Patient taking differently: Take 10 mg by mouth at bedtime.   Sheree Penne Bruckner, MD Taking Active Self, Pharmacy Records  sertraline  (ZOLOFT ) 50 MG tablet [id number] Yes Take 50 mg by mouth daily. [provider] Taking Active Self, Pharmacy Records  traMADol  (ULTRAM ) 50 MG tablet [id number] Yes TAKE 1 TABLET BY MOUTH EVERY 4 HOURS  Patient taking differently: Take 50 mg by mouth daily as needed for moderate pain (pain score 4-6) or severe pain (pain score 7-10).   Cloria Annabella CROME, DO Taking Active Self, Pharmacy Records           Med Note (CRUTHIS,  CHLOE C   Mon Dec 04, 2023  2:01 PM) Pt is unsure of last dose.  VITAMIN D PO [id number] Yes Take 1 capsule by mouth at bedtime. [provider] Taking Active Self, Pharmacy Records  Med List Note (Cruthis, Sheffield, CPhT 12/04/23 1351): [facility] (independent living)             Home Care and Equipment/Supplies: Were Home Health Services Ordered?: NA Any new equipment or medical supplies ordered?: NA  Functional Questionnaire: Do you need assistance with bathing/showering or dressing?: No Do you need assistance with meal preparation?: No Do you need assistance with eating?: No Do you have difficulty maintaining continence: No Do you need assistance with getting out of bed/getting out of a chair/moving?: No Do you have difficulty managing or taking your medications?: No  Follow up appointments reviewed: PCP Follow-up appointment confirmed?: Yes (Per patient  she stated she has an appt next week / She stated she did not have the calendar in front of her and could not give the date) Specialist Hospital Follow-up appointment confirmed?: NA Do you need transportation to your follow-up appointment?: No Do you understand care options if your condition(s) worsen?: Yes-patient verbalized understanding  SDOH Interventions Today    Flowsheet Row Most Recent Value  SDOH Interventions   Food Insecurity Interventions Intervention Not Indicated  Housing Interventions Intervention Not Indicated  Transportation Interventions Intervention Not Indicated, Patient Resources (Friends/Family)  Utilities Interventions Intervention Not Indicated      Interventions Today    Flowsheet Row Most Recent Value  Chronic Disease   Chronic disease during today's visit Other  [Acute diverticulitis]  General Interventions   General Interventions Discussed/Reviewed General Interventions Discussed, General Interventions Reviewed, Doctor Visits  Doctor Visits Discussed/Reviewed Doctor Visits  Reviewed, Doctor Visits Discussed  [facility] discussed with patient about the importance of following following up with your primary care physician in 1-2 weeks to discuss your chronic medication management and medication changes during this hospital stay.]  Education Interventions   Education Provided Provided Education  Pharmacy Interventions   Pharmacy Dicussed/Reviewed Pharmacy Topics Discussed, Pharmacy Topics Reviewed  [Discussed importance of taking all the antibiotics]       Patient has declined further outreach calls for Care Coordination  Cathlean Headland BSN RN Population Health- Transition of Care Team.  [facility] [phone]

## 2023-12-13 DIAGNOSIS — Z09 Encounter for follow-up examination after completed treatment for conditions other than malignant neoplasm: Secondary | ICD-10-CM | POA: Diagnosis not present

## 2023-12-13 DIAGNOSIS — E118 Type 2 diabetes mellitus with unspecified complications: Secondary | ICD-10-CM | POA: Diagnosis not present

## 2023-12-13 DIAGNOSIS — K5792 Diverticulitis of intestine, part unspecified, without perforation or abscess without bleeding: Secondary | ICD-10-CM | POA: Diagnosis not present

## 2023-12-18 ENCOUNTER — Emergency Department (HOSPITAL_COMMUNITY)
Admission: EM | Admit: 2023-12-18 | Discharge: 2023-12-18 | Disposition: A | Discharge status: Home / Self Care | Payer: Medicare Other | Attending: Emergency Medicine | Admitting: Emergency Medicine

## 2023-12-18 ENCOUNTER — Other Ambulatory Visit: Payer: Self-pay

## 2023-12-18 ENCOUNTER — Encounter (HOSPITAL_COMMUNITY): Payer: Self-pay | Admitting: Emergency Medicine

## 2023-12-18 ENCOUNTER — Emergency Department (HOSPITAL_COMMUNITY): Payer: Medicare Other

## 2023-12-18 DIAGNOSIS — Z7901 Long term (current) use of anticoagulants: Secondary | ICD-10-CM | POA: Insufficient documentation

## 2023-12-18 DIAGNOSIS — K8689 Other specified diseases of pancreas: Secondary | ICD-10-CM | POA: Diagnosis not present

## 2023-12-18 DIAGNOSIS — N179 Acute kidney failure, unspecified: Secondary | ICD-10-CM | POA: Diagnosis not present

## 2023-12-18 DIAGNOSIS — K5730 Diverticulosis of large intestine without perforation or abscess without bleeding: Secondary | ICD-10-CM | POA: Diagnosis not present

## 2023-12-18 DIAGNOSIS — I10 Essential (primary) hypertension: Secondary | ICD-10-CM | POA: Diagnosis not present

## 2023-12-18 DIAGNOSIS — R1030 Lower abdominal pain, unspecified: Secondary | ICD-10-CM | POA: Diagnosis not present

## 2023-12-18 DIAGNOSIS — Z8719 Personal history of other diseases of the digestive system: Secondary | ICD-10-CM | POA: Diagnosis not present

## 2023-12-18 DIAGNOSIS — R739 Hyperglycemia, unspecified: Secondary | ICD-10-CM | POA: Diagnosis not present

## 2023-12-18 DIAGNOSIS — R1032 Left lower quadrant pain: Secondary | ICD-10-CM | POA: Insufficient documentation

## 2023-12-18 LAB — COMPREHENSIVE METABOLIC PANEL
ALT: 18 U/L (ref 0–44)
AST: 38 U/L (ref 15–41)
Albumin: 3.6 g/dL (ref 3.5–5.0)
Alkaline Phosphatase: 81 U/L (ref 38–126)
Anion gap: 13 (ref 5–15)
BUN: 28 mg/dL — ABNORMAL HIGH (ref 8–23)
CO2: 25 mmol/L (ref 22–32)
Calcium: 9.3 mg/dL (ref 8.9–10.3)
Chloride: 94 mmol/L — ABNORMAL LOW (ref 98–111)
Creatinine, Ser: 1.09 mg/dL — ABNORMAL HIGH (ref 0.44–1.00)
GFR, Estimated: 48 mL/min — ABNORMAL LOW (ref >60)
Glucose, Bld: 293 mg/dL — ABNORMAL HIGH (ref 70–99)
Potassium: 3.2 mmol/L — ABNORMAL LOW (ref 3.5–5.1)
Sodium: 132 mmol/L — ABNORMAL LOW (ref 135–145)
Total Bilirubin: 0.5 mg/dL (ref 0.0–1.2)
Total Protein: 7 g/dL (ref 6.5–8.1)

## 2023-12-18 LAB — URINALYSIS, ROUTINE W REFLEX MICROSCOPIC
Bacteria, UA: NONE SEEN
Bilirubin Urine: NEGATIVE
Glucose, UA: >=500 mg/dL — AB
Ketones, ur: NEGATIVE mg/dL
Leukocytes,Ua: NEGATIVE
Nitrite: NEGATIVE
Protein, ur: NEGATIVE mg/dL
Specific Gravity, Urine: 1.012 (ref 1.005–1.030)
pH: 7 (ref 5.0–8.0)

## 2023-12-18 LAB — LIPASE, BLOOD: Lipase: 31 U/L (ref 11–51)

## 2023-12-18 LAB — CBC
HCT: 36.2 % (ref 36.0–46.0)
Hemoglobin: 12 g/dL (ref 12.0–15.0)
MCH: 32.6 pg (ref 26.0–34.0)
MCHC: 33.1 g/dL (ref 30.0–36.0)
MCV: 98.4 fL (ref 80.0–100.0)
Platelets: 465 10*3/uL — ABNORMAL HIGH (ref 150–400)
RBC: 3.68 MIL/uL — ABNORMAL LOW (ref 3.87–5.11)
RDW: 13.6 % (ref 11.5–15.5)
WBC: 6.8 10*3/uL (ref 4.0–10.5)
nRBC: 0 % (ref 0.0–0.2)

## 2023-12-18 MED ORDER — ONDANSETRON HCL 4 MG/2ML IJ SOLN
4.0000 mg | Freq: Once | INTRAMUSCULAR | Status: AC
  Administered 2023-12-18: 4 mg via INTRAVENOUS
  Filled 2023-12-18: qty 2

## 2023-12-18 MED ORDER — IOHEXOL 300 MG/ML  SOLN
100.0000 mL | Freq: Once | INTRAMUSCULAR | Status: AC | PRN
  Administered 2023-12-18: 100 mL via INTRAVENOUS

## 2023-12-18 MED ORDER — MORPHINE SULFATE (PF) 2 MG/ML IV SOLN
2.0000 mg | Freq: Once | INTRAVENOUS | Status: AC
  Administered 2023-12-18: 2 mg via INTRAVENOUS
  Filled 2023-12-18: qty 1

## 2023-12-18 NOTE — ED Triage Notes (Signed)
Pt presents with left sided abd pain starting today, pt reports hx of diverticulitis with s/s feeling the same, went to UC and was sent to ED for CT

## 2023-12-18 NOTE — ED Notes (Signed)
Patient transported to CT 

## 2023-12-18 NOTE — ED Notes (Signed)
Pt ambulated to bathroom 

## 2023-12-18 NOTE — Discharge Instructions (Addendum)
Return for any problem.   Your CT scan does not demonstrate acute diverticulitis.  Your urine testing from today does not reveal evidence of infection or significant bleeding.

## 2023-12-18 NOTE — ED Provider Notes (Signed)
EMERGENCY DEPARTMENT AT [facility] Provider Note   CSN: [id number] Arrival date & time: 12/18/23  2055     History  Chief Complaint  Patient presents with   Abdominal Pain    Heather Olsen is a 89 y.o. female.  89 year old female with prior medical history as detailed below presents for evaluation.  Patient reports left lower quadrant abdominal pain that began earlier today.  Patient's pain is consistent with prior episodes of diverticulitis per patient's report.  Patient was admitted earlier this month for diverticulitis.  She completed the prescribed course of outpatient antibiotics after admission.  She reports that she has been on IV antibiotics for almost a week.  She denies fever.  She denies nausea or vomiting.    The history is provided by the patient and medical records.       Home Medications Prior to Admission medications   Medication Sig Start Date End Date Taking? Authorizing Provider  acetaminophen (TYLENOL) 650 MG CR tablet Take 650 mg by mouth daily as needed for pain.    [provider]  denosumab (PROLIA) 60 MG/ML SOSY injection Inject 60 mg into the skin every 6 (six) months. Patient not taking: Reported on 12/12/2023 07/25/19   [provider]  donepezil (ARICEPT) 5 MG tablet Take 5 mg by mouth at bedtime. 11/20/23   [provider]  ELIQUIS 2.5 MG TABS tablet Take 2.5 mg by mouth 2 (two) times daily. 02/03/20   [provider]  empagliflozin (JARDIANCE) 10 MG TABS tablet Take 1 tablet (10 mg total) by mouth daily. 09/13/23   Zannie Cove, MD  furosemide (LASIX) 40 MG tablet Take 1 tablet (40 mg total) by mouth daily. 08/15/23   Rollene Rotunda, MD  ipratropium (ATROVENT) 0.06 % nasal spray Use 2 sprays in each nostril up to three times daily before meals for sinus drainage. Patient taking differently: Place 2 sprays into the nose daily as needed for rhinitis. 09/30/16   Kimber Relic, MD  LINZESS 145  MCG CAPS capsule Take 145 mcg by mouth daily as needed (constipation). 07/29/22   [provider]  metFORMIN (GLUCOPHAGE-XR) 500 MG 24 hr tablet Take 1,000 mg by mouth at bedtime. 02/01/22   [provider]  mirabegron ER (MYRBETRIQ) 50 MG TB24 tablet Take 50 mg by mouth at bedtime. 08/11/21   [provider]  mirtazapine (REMERON) 15 MG tablet Take 15 mg by mouth at bedtime. 11/14/23   [provider]  MULTIPLE VITAMINS-MINERALS PO Take 1 tablet by mouth daily.     [provider]  nitroGLYCERIN (NITROSTAT) 0.4 MG SL tablet Place 1 tablet (0.4 mg total) under the tongue every 5 (five) minutes as needed for chest pain. Place one tablet under the tongue every 5 minutes as needed for chest pain. No more than 3 08/09/22   Rollene Rotunda, MD  pantoprazole (PROTONIX) 40 MG tablet Take 40 mg by mouth 2 (two) times daily. 12/18/19   [provider]  pindolol (VISKEN) 5 MG tablet Take 1/2 (one-half) tablet by mouth twice daily Patient taking differently: Take 2.5 mg by mouth 2 (two) times daily. 07/04/23   Rollene Rotunda, MD  potassium chloride SA (KLOR-CON M) 20 MEQ tablet Take 1 tablet (20 mEq total) by mouth daily. 09/13/23   Zannie Cove, MD  Probiotic Product (PROBIOTIC DAILY PO) Take 1 capsule by mouth at bedtime.    [provider]  rOPINIRole (REQUIP) 0.5 MG tablet Take 0.5 mg by mouth  at bedtime. 07/13/22   [provider]  rosuvastatin (CRESTOR) 10 MG tablet TAKE 1 TABLET BY MOUTH AT BEDTIME Patient taking differently: Take 10 mg by mouth at bedtime. 11/23/20   Maeola Harman, MD  sertraline (ZOLOFT) 50 MG tablet Take 50 mg by mouth daily.    [provider]  traMADol (ULTRAM) 50 MG tablet TAKE 1 TABLET BY MOUTH EVERY 4 HOURS Patient taking differently: Take 50 mg by mouth daily as needed for moderate pain (pain score 4-6) or severe pain (pain score 7-10). 11/14/16   Reed, Tiffany L, DO  VITAMIN D PO Take 1  capsule by mouth at bedtime.    [provider]      Allergies    Sulfasalazine, Topiramate, Ace inhibitors, Codeine, Ibandronic acid, Meloxicam, Prevacid [lansoprazole], Sulfa antibiotics, Verapamil, and Zolpidem    Review of Systems   Review of Systems  All other systems reviewed and are negative.   Physical Exam Updated Vital Signs BP 123/71   Pulse 71   Temp 98.4 F (36.9 C) (Oral)   Resp 14   Ht 5\' 6"  (1.676 m)   Wt 48.5 kg   SpO2 93%   BMI 17.27 kg/m  Physical Exam Vitals and nursing note reviewed.  Constitutional:      General: She is not in acute distress.    Appearance: Normal appearance. She is well-developed.  HENT:     Head: Normocephalic and atraumatic.  Eyes:     Conjunctiva/sclera: Conjunctivae normal.     Pupils: Pupils are equal, round, and reactive to light.  Cardiovascular:     Rate and Rhythm: Normal rate and regular rhythm.     Heart sounds: Normal heart sounds.  Pulmonary:     Effort: Pulmonary effort is normal. No respiratory distress.     Breath sounds: Normal breath sounds.  Abdominal:     General: There is no distension.     Palpations: Abdomen is soft.     Tenderness: There is abdominal tenderness in the left lower quadrant.  Musculoskeletal:        General: No deformity. Normal range of motion.     Cervical back: Normal range of motion and neck supple.  Skin:    General: Skin is warm and dry.  Neurological:     General: No focal deficit present.     Mental Status: She is alert and oriented to person, place, and time.     ED Results / Procedures / Treatments   Labs (all labs ordered are listed, but only abnormal results are displayed) Labs Reviewed  LIPASE, BLOOD  COMPREHENSIVE METABOLIC PANEL  CBC  URINALYSIS, ROUTINE W REFLEX MICROSCOPIC    EKG None  Radiology No results found.  Procedures Procedures    Medications Ordered in ED Medications - No data to display  ED Course/ Medical Decision Making/ A&P                                  Medical Decision Making Amount and/or Complexity of Data Reviewed Labs: ordered. Radiology: ordered.  Risk Prescription drug management.    Medical Screen Complete  This patient presented to the ED with complaint of abdominal pain.  This complaint involves an extensive number of treatment options. The initial differential diagnosis includes, but is not limited to, recurrent diverticulitis, other intra-abdominal pathology, abscess, bowel perforation, metabolic abnormality, etc.  This presentation is: Acute, Chronic, Self-Limited, Previously Undiagnosed, Uncertain  Prognosis, Complicated, Systemic Symptoms, and Threat to Life/Bodily Function  Patient was prior history of diverticulitis with most recent episode earlier this month.  She was admitted and received antibiotics and completed a course of outpatient antibiotics after admission.  She presents today with recurrent left lower quadrant pain.  Patient is nontoxic.  Patient's presentation and exam are consistent with possible recurrent diverticulitis.  Workup is reassuring.  Patient's white count is without elevation.  CT imaging  does not show acute diverticulitis.  On reevaluation the patient feels much improved.  She desires discharge home.  She declines admission for observation and/or pain control.  Patient understands need for close outpatient follow-up.  Strict return precautions given and understood.  Additional history obtained:  External records from outside sources obtained and reviewed including prior ED visits and prior Inpatient records.    Lab Tests:  I ordered and personally interpreted labs.  The pertinent results include: CBC, CMP, lipase, UA   Imaging Studies ordered:  I ordered imaging studies including CT abdomen pelvis I independently visualized and interpreted obtained imaging which showed decreased inflammatory changes consistent with resolving diverticulitis I  agree with the radiologist interpretation.   Cardiac Monitoring:  The patient was maintained on a cardiac monitor.  I personally viewed and interpreted the cardiac monitor which showed an underlying rhythm of: NSR   Medicines ordered:  I ordered medication including morphine, Zofran for pain, nausea Reevaluation of the patient after these medicines showed that the patient: resolved   Problem List / ED Course:  Abdominal pain   Reevaluation:  After the interventions noted above, I reevaluated the patient and found that they have: resolved   Disposition:  After consideration of the diagnostic results and the patients response to treatment, I feel that the patent would benefit from close outpatient follow-up.          Final Clinical Impression(s) / ED Diagnoses Final diagnoses:  Left lower quadrant abdominal pain    Rx / DC Orders ED Discharge Orders     None         Wynetta Fines, MD 12/18/23 [phone]

## 2023-12-20 ENCOUNTER — Emergency Department (HOSPITAL_COMMUNITY)
Admission: EM | Admit: 2023-12-20 | Discharge: 2023-12-20 | Disposition: A | Discharge status: Home / Self Care | Payer: Medicare Other | Source: Home / Self Care | Attending: Student | Admitting: Student

## 2023-12-20 ENCOUNTER — Emergency Department (HOSPITAL_COMMUNITY): Payer: Medicare Other

## 2023-12-20 ENCOUNTER — Other Ambulatory Visit: Payer: Self-pay

## 2023-12-20 DIAGNOSIS — Z885 Allergy status to narcotic agent status: Secondary | ICD-10-CM | POA: Diagnosis not present

## 2023-12-20 DIAGNOSIS — I700 Atherosclerosis of aorta: Secondary | ICD-10-CM | POA: Diagnosis not present

## 2023-12-20 DIAGNOSIS — I517 Cardiomegaly: Secondary | ICD-10-CM | POA: Diagnosis not present

## 2023-12-20 DIAGNOSIS — K5792 Diverticulitis of intestine, part unspecified, without perforation or abscess without bleeding: Secondary | ICD-10-CM | POA: Diagnosis not present

## 2023-12-20 DIAGNOSIS — R918 Other nonspecific abnormal finding of lung field: Secondary | ICD-10-CM | POA: Diagnosis not present

## 2023-12-20 DIAGNOSIS — Z961 Presence of intraocular lens: Secondary | ICD-10-CM | POA: Diagnosis present

## 2023-12-20 DIAGNOSIS — R778 Other specified abnormalities of plasma proteins: Secondary | ICD-10-CM | POA: Diagnosis not present

## 2023-12-20 DIAGNOSIS — I480 Paroxysmal atrial fibrillation: Secondary | ICD-10-CM | POA: Diagnosis not present

## 2023-12-20 DIAGNOSIS — K5900 Constipation, unspecified: Secondary | ICD-10-CM | POA: Diagnosis not present

## 2023-12-20 DIAGNOSIS — R140 Abdominal distension (gaseous): Secondary | ICD-10-CM | POA: Diagnosis not present

## 2023-12-20 DIAGNOSIS — M7989 Other specified soft tissue disorders: Secondary | ICD-10-CM | POA: Diagnosis not present

## 2023-12-20 DIAGNOSIS — Z8249 Family history of ischemic heart disease and other diseases of the circulatory system: Secondary | ICD-10-CM | POA: Diagnosis not present

## 2023-12-20 DIAGNOSIS — F0393 Unspecified dementia, unspecified severity, with mood disturbance: Secondary | ICD-10-CM | POA: Diagnosis present

## 2023-12-20 DIAGNOSIS — R002 Palpitations: Secondary | ICD-10-CM | POA: Diagnosis not present

## 2023-12-20 DIAGNOSIS — Z8719 Personal history of other diseases of the digestive system: Secondary | ICD-10-CM | POA: Diagnosis not present

## 2023-12-20 DIAGNOSIS — Z7984 Long term (current) use of oral hypoglycemic drugs: Secondary | ICD-10-CM | POA: Diagnosis not present

## 2023-12-20 DIAGNOSIS — R000 Tachycardia, unspecified: Secondary | ICD-10-CM | POA: Diagnosis not present

## 2023-12-20 DIAGNOSIS — I2510 Atherosclerotic heart disease of native coronary artery without angina pectoris: Secondary | ICD-10-CM | POA: Diagnosis present

## 2023-12-20 DIAGNOSIS — E785 Hyperlipidemia, unspecified: Secondary | ICD-10-CM | POA: Diagnosis present

## 2023-12-20 DIAGNOSIS — Z7901 Long term (current) use of anticoagulants: Secondary | ICD-10-CM | POA: Diagnosis not present

## 2023-12-20 DIAGNOSIS — Z79899 Other long term (current) drug therapy: Secondary | ICD-10-CM | POA: Diagnosis not present

## 2023-12-20 DIAGNOSIS — F0390 Unspecified dementia without behavioral disturbance: Secondary | ICD-10-CM | POA: Diagnosis not present

## 2023-12-20 DIAGNOSIS — K5732 Diverticulitis of large intestine without perforation or abscess without bleeding: Secondary | ICD-10-CM | POA: Diagnosis present

## 2023-12-20 DIAGNOSIS — E1122 Type 2 diabetes mellitus with diabetic chronic kidney disease: Secondary | ICD-10-CM | POA: Diagnosis not present

## 2023-12-20 DIAGNOSIS — R1032 Left lower quadrant pain: Secondary | ICD-10-CM | POA: Diagnosis not present

## 2023-12-20 DIAGNOSIS — R0602 Shortness of breath: Secondary | ICD-10-CM | POA: Diagnosis not present

## 2023-12-20 DIAGNOSIS — E119 Type 2 diabetes mellitus without complications: Secondary | ICD-10-CM | POA: Diagnosis present

## 2023-12-20 DIAGNOSIS — I440 Atrioventricular block, first degree: Secondary | ICD-10-CM | POA: Diagnosis not present

## 2023-12-20 DIAGNOSIS — F0394 Unspecified dementia, unspecified severity, with anxiety: Secondary | ICD-10-CM | POA: Diagnosis present

## 2023-12-20 DIAGNOSIS — N1831 Chronic kidney disease, stage 3a: Secondary | ICD-10-CM | POA: Diagnosis not present

## 2023-12-20 DIAGNOSIS — I5033 Acute on chronic diastolic (congestive) heart failure: Secondary | ICD-10-CM | POA: Diagnosis not present

## 2023-12-20 DIAGNOSIS — I499 Cardiac arrhythmia, unspecified: Secondary | ICD-10-CM | POA: Diagnosis not present

## 2023-12-20 DIAGNOSIS — D649 Anemia, unspecified: Secondary | ICD-10-CM | POA: Diagnosis not present

## 2023-12-20 DIAGNOSIS — J189 Pneumonia, unspecified organism: Secondary | ICD-10-CM | POA: Diagnosis not present

## 2023-12-20 DIAGNOSIS — D75838 Other thrombocytosis: Secondary | ICD-10-CM | POA: Diagnosis present

## 2023-12-20 DIAGNOSIS — R109 Unspecified abdominal pain: Secondary | ICD-10-CM | POA: Diagnosis not present

## 2023-12-20 DIAGNOSIS — D75839 Thrombocytosis, unspecified: Secondary | ICD-10-CM | POA: Diagnosis not present

## 2023-12-20 DIAGNOSIS — E876 Hypokalemia: Secondary | ICD-10-CM | POA: Diagnosis present

## 2023-12-20 DIAGNOSIS — D539 Nutritional anemia, unspecified: Secondary | ICD-10-CM | POA: Diagnosis not present

## 2023-12-20 DIAGNOSIS — J9601 Acute respiratory failure with hypoxia: Secondary | ICD-10-CM | POA: Diagnosis not present

## 2023-12-20 DIAGNOSIS — J984 Other disorders of lung: Secondary | ICD-10-CM | POA: Diagnosis not present

## 2023-12-20 DIAGNOSIS — Y95 Nosocomial condition: Secondary | ICD-10-CM | POA: Diagnosis present

## 2023-12-20 DIAGNOSIS — F32A Depression, unspecified: Secondary | ICD-10-CM | POA: Diagnosis present

## 2023-12-20 DIAGNOSIS — E871 Hypo-osmolality and hyponatremia: Secondary | ICD-10-CM | POA: Diagnosis not present

## 2023-12-20 DIAGNOSIS — R7989 Other specified abnormal findings of blood chemistry: Secondary | ICD-10-CM | POA: Diagnosis not present

## 2023-12-20 DIAGNOSIS — R1084 Generalized abdominal pain: Secondary | ICD-10-CM | POA: Diagnosis not present

## 2023-12-20 DIAGNOSIS — I110 Hypertensive heart disease with heart failure: Secondary | ICD-10-CM | POA: Diagnosis present

## 2023-12-20 DIAGNOSIS — I10 Essential (primary) hypertension: Secondary | ICD-10-CM | POA: Diagnosis not present

## 2023-12-20 DIAGNOSIS — Z1152 Encounter for screening for COVID-19: Secondary | ICD-10-CM | POA: Diagnosis not present

## 2023-12-20 LAB — COMPREHENSIVE METABOLIC PANEL
ALT: 17 U/L (ref 0–44)
AST: 39 U/L (ref 15–41)
Albumin: 3.5 g/dL (ref 3.5–5.0)
Alkaline Phosphatase: 92 U/L (ref 38–126)
Anion gap: 10 (ref 5–15)
BUN: 28 mg/dL — ABNORMAL HIGH (ref 8–23)
CO2: 28 mmol/L (ref 22–32)
Calcium: 9.6 mg/dL (ref 8.9–10.3)
Chloride: 94 mmol/L — ABNORMAL LOW (ref 98–111)
Creatinine, Ser: 1.07 mg/dL — ABNORMAL HIGH (ref 0.44–1.00)
GFR, Estimated: 49 mL/min — ABNORMAL LOW (ref >60)
Glucose, Bld: 220 mg/dL — ABNORMAL HIGH (ref 70–99)
Potassium: 4.2 mmol/L (ref 3.5–5.1)
Sodium: 132 mmol/L — ABNORMAL LOW (ref 135–145)
Total Bilirubin: 0.4 mg/dL (ref 0.0–1.2)
Total Protein: 7 g/dL (ref 6.5–8.1)

## 2023-12-20 LAB — CBC
HCT: 34.6 % — ABNORMAL LOW (ref 36.0–46.0)
Hemoglobin: 11.4 g/dL — ABNORMAL LOW (ref 12.0–15.0)
MCH: 33.7 pg (ref 26.0–34.0)
MCHC: 32.9 g/dL (ref 30.0–36.0)
MCV: 102.4 fL — ABNORMAL HIGH (ref 80.0–100.0)
Platelets: 461 10*3/uL — ABNORMAL HIGH (ref 150–400)
RBC: 3.38 MIL/uL — ABNORMAL LOW (ref 3.87–5.11)
RDW: 14.3 % (ref 11.5–15.5)
WBC: 7.8 10*3/uL (ref 4.0–10.5)
nRBC: 0 % (ref 0.0–0.2)

## 2023-12-20 LAB — LIPASE, BLOOD: Lipase: 51 U/L (ref 11–51)

## 2023-12-20 LAB — BRAIN NATRIURETIC PEPTIDE: B Natriuretic Peptide: 541.8 pg/mL — ABNORMAL HIGH (ref 0.0–100.0)

## 2023-12-20 MED ORDER — AMOXICILLIN-POT CLAVULANATE 500-125 MG PO TABS: 1.0000 | ORAL_TABLET | Freq: Two times a day (BID) | ORAL | 0 refills | Status: DC

## 2023-12-20 MED ORDER — DICYCLOMINE HCL 20 MG PO TABS: 20.0000 mg | ORAL_TABLET | Freq: Two times a day (BID) | ORAL | 0 refills | Status: AC

## 2023-12-20 NOTE — ED Notes (Signed)
Patient was given ice water. 

## 2023-12-20 NOTE — ED Notes (Signed)
Pt is ambulatory and walked to the bathroom and attempted to provide UA, pt was unable.

## 2023-12-20 NOTE — Discharge Instructions (Signed)
As we discussed, your workup in the ER today was reassuring for acute findings.  Laboratory evaluation and x-ray imaging did not reveal any emergent cause of your symptoms.  Given you are concerned about diverticulitis, we are treating same with Augmentin.  Please take this as prescribed in its entirety.  I have also given you Bentyl to take as prescribed as needed.  Please follow-up closely with your primary care doctor.  Return if development of any new or worsening symptoms.

## 2023-12-20 NOTE — ED Provider Notes (Signed)
[place] [facility] AT [facility] Provider Note   CSN: [id number] Arrival date & time: 12/20/23  1800     History  Chief Complaint  Patient presents with   Abdominal Pain    Heather Olsen is a 89 y.o. female.  Patient with history of hyperlipidemia, CAD, diverticulitis, hypertension [facility] with complaints of left lower quadrant pain.  She states that same has been ongoing for the last 2 days.  Pain is consistent with her previous flares of diverticulitis.  She was here 2 days ago and had a CT scan that did not show definitive etiology of symptoms and patient was discharged without antibiotics.  Patient states that she was admitted at the beginning of the month for diverticulitis and was on IV antibiotics for which seem to substantially improve her symptoms.  She presents requesting antibiotics.  She denies any fevers, chills, nausea, vomiting, or diarrhea.  Has not had any worsening pain since she was discharged, it is been persistent however.  Of note, patient does also endorse concern that she has fluid overload from her heart failure.  She has been taking her Lasix and denies any significant changes in her weight.  Has some edema in her bilateral lower extremities, states this is not more than usual.  No abdominal distention.  No significant dyspnea.  The history is provided by the patient. No language interpreter was used.  Abdominal Pain      Home Medications Prior to Admission medications   Medication Sig Start Date End Date Taking? Authorizing Provider  acetaminophen (TYLENOL) 650 MG CR tablet Take 650 mg by mouth daily as needed for pain.    [provider]  denosumab (PROLIA) 60 MG/ML SOSY injection Inject 60 mg into the skin every 6 (six) months. Patient not taking: Reported on 12/12/2023 07/25/19   [provider]  donepezil (ARICEPT) 5 MG tablet Take 5 mg by mouth at bedtime. 11/20/23    [provider]  ELIQUIS 2.5 MG TABS tablet Take 2.5 mg by mouth 2 (two) times daily. 02/03/20   [provider]  empagliflozin (JARDIANCE) 10 MG TABS tablet Take 1 tablet (10 mg total) by mouth daily. 09/13/23   Zannie Cove, MD  furosemide (LASIX) 40 MG tablet Take 1 tablet (40 mg total) by mouth daily. 08/15/23   Rollene Rotunda, MD  ipratropium (ATROVENT) 0.06 % nasal spray Use 2 sprays in each nostril up to three times daily before meals for sinus drainage. Patient taking differently: Place 2 sprays into the nose daily as needed for rhinitis. 09/30/16   Kimber Relic, MD  LINZESS 145 MCG CAPS capsule Take 145 mcg by mouth daily as needed (constipation). 07/29/22   [provider]  metFORMIN (GLUCOPHAGE-XR) 500 MG 24 hr tablet Take 1,000 mg by mouth at bedtime. 02/01/22   [provider]  mirabegron ER (MYRBETRIQ) 50 MG TB24 tablet Take 50 mg by mouth at bedtime. 08/11/21   [provider]  mirtazapine (REMERON) 15 MG tablet Take 15 mg by mouth at bedtime. 11/14/23   [provider]  MULTIPLE VITAMINS-MINERALS PO Take 1 tablet by mouth daily.     [provider]  nitroGLYCERIN (NITROSTAT) 0.4 MG SL tablet Place 1 tablet (0.4 mg total) under the tongue every 5 (five) minutes as needed for chest pain. Place one tablet under the tongue every 5 minutes as needed for chest pain. No more than 3 08/09/22   Rollene Rotunda, MD  pantoprazole (PROTONIX) 40 MG tablet Take 40 mg by mouth 2 (two) times daily. 12/18/19   [provider]  pindolol (VISKEN) 5 MG tablet Take 1/2 (one-half) tablet by mouth twice daily Patient taking differently: Take 2.5 mg by mouth 2 (two) times daily. 07/04/23   Rollene Rotunda, MD  potassium chloride SA (KLOR-CON M) 20 MEQ tablet Take 1 tablet (20 mEq total) by mouth daily. 09/13/23   Zannie Cove, MD  Probiotic Product (PROBIOTIC DAILY PO) Take 1 capsule by mouth at bedtime.    [provider]   rOPINIRole (REQUIP) 0.5 MG tablet Take 0.5 mg by mouth at bedtime. 07/13/22   [provider]  rosuvastatin (CRESTOR) 10 MG tablet TAKE 1 TABLET BY MOUTH AT BEDTIME Patient taking differently: Take 10 mg by mouth at bedtime. 11/23/20   Maeola Harman, MD  sertraline (ZOLOFT) 50 MG tablet Take 50 mg by mouth daily.    [provider]  traMADol (ULTRAM) 50 MG tablet TAKE 1 TABLET BY MOUTH EVERY 4 HOURS Patient taking differently: Take 50 mg by mouth daily as needed for moderate pain (pain score 4-6) or severe pain (pain score 7-10). 11/14/16   Reed, Tiffany L, DO  VITAMIN D PO Take 1 capsule by mouth at bedtime.    [provider]      Allergies    Sulfasalazine, Topiramate, Ace inhibitors, Codeine, Ibandronic acid, Meloxicam, Prevacid [lansoprazole], Sulfa antibiotics, Verapamil, and Zolpidem    Review of Systems   Review of Systems  Gastrointestinal:  Positive for abdominal pain.  All other systems reviewed and are negative.   Physical Exam Updated Vital Signs BP 129/70 (BP Location: Right Arm)   Pulse 80   Temp 98.2 F (36.8 C) (Oral)   Resp 16   SpO2 99%  Physical Exam Vitals and nursing note reviewed.  Constitutional:      General: She is not in acute distress.    Appearance: Normal appearance. She is normal weight. She is not ill-appearing, toxic-appearing or diaphoretic.  HENT:     Head: Normocephalic and atraumatic.  Cardiovascular:     Rate and Rhythm: Normal rate and regular rhythm.     Heart sounds: Normal heart sounds.  Pulmonary:     Effort: Pulmonary effort is normal. No respiratory distress.     Breath sounds: Normal breath sounds.  Abdominal:     General: Abdomen is flat.     Palpations: Abdomen is soft.     Tenderness: There is abdominal tenderness in the left lower quadrant. There is no guarding or rebound.  Musculoskeletal:        General: Normal range of motion.     Cervical back: Normal range of motion.      Comments: Trace pitting edema in bilateral lower extremities  Skin:    General: Skin is warm and dry.  Neurological:     General: No focal deficit present.     Mental Status: She is alert.  Psychiatric:        Mood and Affect: Mood normal.        Behavior: Behavior normal.     ED Results / Procedures / Treatments   Labs (all labs ordered are listed, but only abnormal results are displayed) Labs Reviewed  COMPREHENSIVE METABOLIC PANEL - Abnormal; Notable for the following components:      Result Value   Sodium 132 (*)    Chloride 94 (*)    Glucose, Bld 220 (*)    BUN 28 (*)  Creatinine, Ser 1.07 (*)    GFR, Estimated 49 (*)    All other components within normal limits  CBC - Abnormal; Notable for the following components:   RBC 3.38 (*)    Hemoglobin 11.4 (*)    HCT 34.6 (*)    MCV 102.4 (*)    Platelets 461 (*)    All other components within normal limits  BRAIN NATRIURETIC PEPTIDE - Abnormal; Notable for the following components:   B Natriuretic Peptide 541.8 (*)    All other components within normal limits  LIPASE, BLOOD  URINALYSIS, ROUTINE W REFLEX MICROSCOPIC    EKG None  Radiology DG Chest Portable 1 View Result Date: 12/20/2023 CLINICAL DATA:  Short of breath, abdominal pain for several days, history of diverticulitis EXAM: PORTABLE CHEST 1 VIEW COMPARISON:  12/04/2023 FINDINGS: Single frontal view of the chest demonstrates a stable cardiac silhouette. No airspace disease, effusion, or pneumothorax. Stable right convex thoracic scoliosis. No acute bony abnormality. IMPRESSION: 1. Stable chest, no acute process. Electronically Signed   By: Sharlet Salina M.D.   On: 12/20/2023 19:24   CT ABDOMEN PELVIS W CONTRAST Result Date: 12/18/2023 CLINICAL DATA:  Left lower quadrant pain EXAM: CT ABDOMEN AND PELVIS WITH CONTRAST TECHNIQUE: Multidetector CT imaging of the abdomen and pelvis was performed using the standard protocol following bolus administration of  intravenous contrast. RADIATION DOSE REDUCTION: This exam was performed according to the departmental dose-optimization program which includes automated exposure control, adjustment of the mA and/or kV according to patient size and/or use of iterative reconstruction technique. CONTRAST:  OMNIPAQUE IOHEXOL 300 MG/ML  SOLN COMPARISON:  CT 12/04/2023, 08/10/2023, 07/26/2023 FINDINGS: Lower chest: Lung bases demonstrate cardiomegaly. Patchy scarring or atelectasis at the bases. No acute consolidation Hepatobiliary: No calcified gallstones. No biliary dilatation. Hyperenhancement adjacent to the gallbladder may be reflecting altered perfusion in the liver. Pancreas: Atrophic.  No inflammation Spleen: Prior granulomatous disease.  Otherwise negative Adrenals/Urinary Tract: Adrenal glands are normal. Kidneys show no hydronephrosis. Bladder is unremarkable Stomach/Bowel: Stomach nonenlarged. No dilated small bowel. Diverticular disease of the colon. Decreased inflammatory changes at the descending and sigmoid colon with mild residual wall thickening at the sigmoid colon. No perforation or abscess Vascular/Lymphatic: Advanced aortic atherosclerosis. No aneurysm. No suspicious lymph nodes Reproductive: Uterus and bilateral adnexa are unremarkable. Other: Negative for free air or free fluid Musculoskeletal: Scoliosis and degenerative changes. No acute osseous abnormality IMPRESSION: 1. Diverticular disease of the colon. Decreased inflammatory changes at the descending and sigmoid colon with mild residual wall thickening at the sigmoid colon. No perforation or abscess. 2. Cardiomegaly. 3. Aortic atherosclerosis. Aortic Atherosclerosis (ICD10-I70.0). Electronically Signed   By: Jasmine Pang M.D.   On: 12/18/2023 23:26    Procedures Procedures    Medications Ordered in ED Medications - No data to display  ED Course/ Medical Decision Making/ A&P                                 Medical Decision Making Amount  and/or Complexity of Data Reviewed Labs: ordered. Radiology: ordered.   This patient is a 89 y.o. female who presents to the ED for concern of abdominal pain, this involves an extensive number of treatment options, and is a complaint that carries with it a high risk of complications and morbidity. The emergent differential diagnosis prior to evaluation includes, but is not limited to,  AAA, gastroenteritis, appendicitis, Bowel obstruction, Bowel perforation. Gastroparesis, DKA,  Hernia, Inflammatory bowel disease, mesenteric ischemia, pancreatitis, peritonitis SBP, volvulus.  This is not an exhaustive differential.   Past Medical History / Co-morbidities / Social History:  has a past medical history of Aortic stenosis (04/12/2016), Coronary artery disease, Depression, Diverticulosis, Hearing loss (04/12/2016), Hiatal hernia (04/12/2016), HLD (hyperlipidemia) (04/12/2016), Hyperglycemia (09/14/2016), Hypertension, Idiopathic scoliosis (04/12/2016), Major depression (04/12/2016), Mitral regurgitation, Osteoporosis, senile (04/12/2016), Pernicious anemia, Raynaud's disease (04/12/2016), Scoliosis, and Spinal stenosis of lumbar region (04/12/2016).  Additional history: Chart reviewed. Pertinent results include: seen 2 days ago and had CT imaging that did not show active diverticulitis. Discharged without antibiotics.   Physical Exam: Physical exam performed. The pertinent findings include: LLQ TTP  Lab Tests: I ordered, and personally interpreted labs.  The pertinent results include:  Na 132, chloride 94, glucose 220, BUN 28, creatinine 1.07, none significantly changed from previous. BNP 541.8 consistent with previous   Imaging Studies: I ordered imaging studies including CXR. I independently visualized and interpreted imaging which showed NAD. I agree with the radiologist interpretation.   Disposition: After consideration of the diagnostic results and the patients response to treatment, I feel that  emergency department workup does not suggest an emergent condition requiring admission or immediate intervention beyond what has been performed at this time. The plan is: Discharge with Augmentin to cover for diverticulitis, Bentyl for pain, close outpatient follow-up and return precautions.  Patient's labs are benign, no indication for repeat imaging at this time.  Given patient's age and risk factors, will cover empirically for diverticulitis again with Augmentin.  No concern for acute CHF exacerbation.  Evaluation and diagnostic testing in the emergency department does not suggest an emergent condition requiring admission or immediate intervention beyond what has been performed at this time.  Plan for discharge with close PCP follow-up.  Patient is understanding and amenable with plan, educated on red flag symptoms that would prompt immediate return.  Patient discharged in stable condition.  I discussed this case with my attending physician Dr. Posey Rea who cosigned this note including patient's presenting symptoms, physical exam, and planned diagnostics and interventions. Attending physician stated agreement with plan or made changes to plan which were implemented.    Final Clinical Impression(s) / ED Diagnoses Final diagnoses:  Left lower quadrant pain    Rx / DC Orders ED Discharge Orders          Ordered    dicyclomine (BENTYL) 20 MG tablet  2 times daily        12/20/23 2043    amoxicillin-clavulanate (AUGMENTIN) 500-125 MG tablet  2 times daily        12/20/23 2043          An After Visit Summary was printed and given to the patient.     Vear Clock 12/20/23 2212    Glendora Score, MD 12/21/23 [phone]

## 2023-12-20 NOTE — ED Triage Notes (Signed)
[facility]IB[facility] from home [facility] PT c/o abd pain for several days. Was released from hospital the other day for diverticulitis and reports a worsening of pain.  AOx4

## 2023-12-21 ENCOUNTER — Encounter (HOSPITAL_COMMUNITY): Payer: Self-pay

## 2023-12-21 ENCOUNTER — Emergency Department (HOSPITAL_COMMUNITY): Payer: Medicare Other

## 2023-12-21 ENCOUNTER — Other Ambulatory Visit: Payer: Self-pay

## 2023-12-21 ENCOUNTER — Inpatient Hospital Stay (HOSPITAL_COMMUNITY)
Admission: EM | Admit: 2023-12-21 | Discharge: 2023-12-23 | DRG: 193 | Disposition: A | Discharge status: Home / Self Care | Payer: Medicare Other | Attending: Internal Medicine | Admitting: Internal Medicine

## 2023-12-21 DIAGNOSIS — F0390 Unspecified dementia without behavioral disturbance: Secondary | ICD-10-CM | POA: Diagnosis not present

## 2023-12-21 DIAGNOSIS — Z8719 Personal history of other diseases of the digestive system: Secondary | ICD-10-CM

## 2023-12-21 DIAGNOSIS — R1084 Generalized abdominal pain: Secondary | ICD-10-CM | POA: Diagnosis not present

## 2023-12-21 DIAGNOSIS — Z888 Allergy status to other drugs, medicaments and biological substances status: Secondary | ICD-10-CM

## 2023-12-21 DIAGNOSIS — R918 Other nonspecific abnormal finding of lung field: Secondary | ICD-10-CM | POA: Diagnosis not present

## 2023-12-21 DIAGNOSIS — R778 Other specified abnormalities of plasma proteins: Secondary | ICD-10-CM | POA: Diagnosis not present

## 2023-12-21 DIAGNOSIS — I2510 Atherosclerotic heart disease of native coronary artery without angina pectoris: Secondary | ICD-10-CM | POA: Diagnosis present

## 2023-12-21 DIAGNOSIS — F0393 Unspecified dementia, unspecified severity, with mood disturbance: Secondary | ICD-10-CM | POA: Diagnosis present

## 2023-12-21 DIAGNOSIS — I5033 Acute on chronic diastolic (congestive) heart failure: Secondary | ICD-10-CM | POA: Diagnosis present

## 2023-12-21 DIAGNOSIS — I7300 Raynaud's syndrome without gangrene: Secondary | ICD-10-CM | POA: Diagnosis present

## 2023-12-21 DIAGNOSIS — I517 Cardiomegaly: Secondary | ICD-10-CM | POA: Diagnosis not present

## 2023-12-21 DIAGNOSIS — I440 Atrioventricular block, first degree: Secondary | ICD-10-CM | POA: Diagnosis not present

## 2023-12-21 DIAGNOSIS — D75839 Thrombocytosis, unspecified: Secondary | ICD-10-CM | POA: Diagnosis present

## 2023-12-21 DIAGNOSIS — J9601 Acute respiratory failure with hypoxia: Secondary | ICD-10-CM | POA: Diagnosis present

## 2023-12-21 DIAGNOSIS — I10 Essential (primary) hypertension: Secondary | ICD-10-CM | POA: Diagnosis present

## 2023-12-21 DIAGNOSIS — Z7901 Long term (current) use of anticoagulants: Secondary | ICD-10-CM | POA: Diagnosis not present

## 2023-12-21 DIAGNOSIS — F32A Depression, unspecified: Secondary | ICD-10-CM | POA: Diagnosis present

## 2023-12-21 DIAGNOSIS — D649 Anemia, unspecified: Secondary | ICD-10-CM | POA: Diagnosis not present

## 2023-12-21 DIAGNOSIS — R0602 Shortness of breath: Secondary | ICD-10-CM | POA: Diagnosis not present

## 2023-12-21 DIAGNOSIS — Z7984 Long term (current) use of oral hypoglycemic drugs: Secondary | ICD-10-CM

## 2023-12-21 DIAGNOSIS — I499 Cardiac arrhythmia, unspecified: Secondary | ICD-10-CM | POA: Diagnosis not present

## 2023-12-21 DIAGNOSIS — R7989 Other specified abnormal findings of blood chemistry: Secondary | ICD-10-CM | POA: Diagnosis present

## 2023-12-21 DIAGNOSIS — E119 Type 2 diabetes mellitus without complications: Secondary | ICD-10-CM | POA: Diagnosis present

## 2023-12-21 DIAGNOSIS — E1122 Type 2 diabetes mellitus with diabetic chronic kidney disease: Secondary | ICD-10-CM | POA: Diagnosis not present

## 2023-12-21 DIAGNOSIS — R140 Abdominal distension (gaseous): Secondary | ICD-10-CM | POA: Diagnosis not present

## 2023-12-21 DIAGNOSIS — E871 Hypo-osmolality and hyponatremia: Secondary | ICD-10-CM | POA: Diagnosis not present

## 2023-12-21 DIAGNOSIS — D75838 Other thrombocytosis: Secondary | ICD-10-CM | POA: Diagnosis present

## 2023-12-21 DIAGNOSIS — Z882 Allergy status to sulfonamides status: Secondary | ICD-10-CM

## 2023-12-21 DIAGNOSIS — E876 Hypokalemia: Secondary | ICD-10-CM | POA: Diagnosis present

## 2023-12-21 DIAGNOSIS — E785 Hyperlipidemia, unspecified: Secondary | ICD-10-CM | POA: Diagnosis present

## 2023-12-21 DIAGNOSIS — Z8249 Family history of ischemic heart disease and other diseases of the circulatory system: Secondary | ICD-10-CM

## 2023-12-21 DIAGNOSIS — Y95 Nosocomial condition: Secondary | ICD-10-CM | POA: Diagnosis present

## 2023-12-21 DIAGNOSIS — I480 Paroxysmal atrial fibrillation: Secondary | ICD-10-CM | POA: Diagnosis present

## 2023-12-21 DIAGNOSIS — Z79899 Other long term (current) drug therapy: Secondary | ICD-10-CM

## 2023-12-21 DIAGNOSIS — R000 Tachycardia, unspecified: Secondary | ICD-10-CM | POA: Diagnosis not present

## 2023-12-21 DIAGNOSIS — K5732 Diverticulitis of large intestine without perforation or abscess without bleeding: Secondary | ICD-10-CM | POA: Diagnosis present

## 2023-12-21 DIAGNOSIS — J189 Pneumonia, unspecified organism: Secondary | ICD-10-CM | POA: Diagnosis not present

## 2023-12-21 DIAGNOSIS — D539 Nutritional anemia, unspecified: Secondary | ICD-10-CM | POA: Diagnosis not present

## 2023-12-21 DIAGNOSIS — I700 Atherosclerosis of aorta: Secondary | ICD-10-CM | POA: Diagnosis not present

## 2023-12-21 DIAGNOSIS — I110 Hypertensive heart disease with heart failure: Secondary | ICD-10-CM | POA: Diagnosis present

## 2023-12-21 DIAGNOSIS — Z886 Allergy status to analgesic agent status: Secondary | ICD-10-CM

## 2023-12-21 DIAGNOSIS — Z961 Presence of intraocular lens: Secondary | ICD-10-CM | POA: Diagnosis present

## 2023-12-21 DIAGNOSIS — Z1152 Encounter for screening for COVID-19: Secondary | ICD-10-CM

## 2023-12-21 DIAGNOSIS — H9190 Unspecified hearing loss, unspecified ear: Secondary | ICD-10-CM | POA: Diagnosis present

## 2023-12-21 DIAGNOSIS — Z885 Allergy status to narcotic agent status: Secondary | ICD-10-CM

## 2023-12-21 DIAGNOSIS — R54 Age-related physical debility: Secondary | ICD-10-CM | POA: Diagnosis present

## 2023-12-21 DIAGNOSIS — M810 Age-related osteoporosis without current pathological fracture: Secondary | ICD-10-CM | POA: Diagnosis present

## 2023-12-21 DIAGNOSIS — F0394 Unspecified dementia, unspecified severity, with anxiety: Secondary | ICD-10-CM | POA: Diagnosis present

## 2023-12-21 LAB — CBC WITH DIFFERENTIAL/PLATELET
Abs Immature Granulocytes: 0.02 10*3/uL (ref 0.00–0.07)
Basophils Absolute: 0.1 10*3/uL (ref 0.0–0.1)
Basophils Relative: 2 %
Eosinophils Absolute: 0.2 10*3/uL (ref 0.0–0.5)
Eosinophils Relative: 2 %
HCT: 33 % — ABNORMAL LOW (ref 36.0–46.0)
Hemoglobin: 10.8 g/dL — ABNORMAL LOW (ref 12.0–15.0)
Immature Granulocytes: 0 %
Lymphocytes Relative: 29 %
Lymphs Abs: 1.8 10*3/uL (ref 0.7–4.0)
MCH: 32.9 pg (ref 26.0–34.0)
MCHC: 32.7 g/dL (ref 30.0–36.0)
MCV: 100.6 fL — ABNORMAL HIGH (ref 80.0–100.0)
Monocytes Absolute: 0.7 10*3/uL (ref 0.1–1.0)
Monocytes Relative: 12 %
Neutro Abs: 3.5 10*3/uL (ref 1.7–7.7)
Neutrophils Relative %: 55 %
Platelets: 416 10*3/uL — ABNORMAL HIGH (ref 150–400)
RBC: 3.28 MIL/uL — ABNORMAL LOW (ref 3.87–5.11)
RDW: 14.1 % (ref 11.5–15.5)
WBC: 6.3 10*3/uL (ref 4.0–10.5)
nRBC: 0 % (ref 0.0–0.2)

## 2023-12-21 LAB — HEMOGLOBIN AND HEMATOCRIT, BLOOD
HCT: 34.9 % — ABNORMAL LOW (ref 36.0–46.0)
Hemoglobin: 11.7 g/dL — ABNORMAL LOW (ref 12.0–15.0)

## 2023-12-21 LAB — BASIC METABOLIC PANEL
Anion gap: 12 (ref 5–15)
BUN: 20 mg/dL (ref 8–23)
CO2: 25 mmol/L (ref 22–32)
Calcium: 9.1 mg/dL (ref 8.9–10.3)
Chloride: 94 mmol/L — ABNORMAL LOW (ref 98–111)
Creatinine, Ser: 1.01 mg/dL — ABNORMAL HIGH (ref 0.44–1.00)
GFR, Estimated: 53 mL/min — ABNORMAL LOW (ref >60)
Glucose, Bld: 150 mg/dL — ABNORMAL HIGH (ref 70–99)
Potassium: 3.5 mmol/L (ref 3.5–5.1)
Sodium: 131 mmol/L — ABNORMAL LOW (ref 135–145)

## 2023-12-21 LAB — D-DIMER, QUANTITATIVE: D-Dimer, Quant: 1.53 ug{FEU}/mL — ABNORMAL HIGH (ref 0.00–0.50)

## 2023-12-21 LAB — PROCALCITONIN: Procalcitonin: <0.1 ng/mL

## 2023-12-21 LAB — RESP PANEL BY RT-PCR (RSV, FLU A&B, COVID)  RVPGX2
Influenza A by PCR: NEGATIVE
Influenza B by PCR: NEGATIVE
Resp Syncytial Virus by PCR: NEGATIVE
SARS Coronavirus 2 by RT PCR: NEGATIVE

## 2023-12-21 LAB — BRAIN NATRIURETIC PEPTIDE: B Natriuretic Peptide: 490.2 pg/mL — ABNORMAL HIGH (ref 0.0–100.0)

## 2023-12-21 LAB — TROPONIN I (HIGH SENSITIVITY)
Troponin I (High Sensitivity): 21 ng/L — ABNORMAL HIGH (ref <18)
Troponin I (High Sensitivity): 23 ng/L — ABNORMAL HIGH (ref <18)

## 2023-12-21 MED ORDER — APIXABAN 2.5 MG PO TABS
2.5000 mg | ORAL_TABLET | Freq: Two times a day (BID) | ORAL | Status: DC
Start: 2023-12-21 — End: 2023-12-23
  Administered 2023-12-21 – 2023-12-23 (×5): 2.5 mg via ORAL
  Filled 2023-12-21 (×6): qty 1

## 2023-12-21 MED ORDER — ROSUVASTATIN CALCIUM 5 MG PO TABS
10.0000 mg | ORAL_TABLET | Freq: Every day | ORAL | Status: DC
Start: 2023-12-21 — End: 2023-12-23
  Administered 2023-12-21 – 2023-12-22 (×2): 10 mg via ORAL
  Filled 2023-12-21 (×2): qty 2

## 2023-12-21 MED ORDER — PANTOPRAZOLE SODIUM 40 MG PO TBEC
40.0000 mg | DELAYED_RELEASE_TABLET | Freq: Two times a day (BID) | ORAL | Status: DC
Start: 2023-12-21 — End: 2023-12-23
  Administered 2023-12-21 – 2023-12-23 (×5): 40 mg via ORAL
  Filled 2023-12-21 (×6): qty 1

## 2023-12-21 MED ORDER — SODIUM CHLORIDE 0.9 % IV SOLN
2.0000 g | Freq: Once | INTRAVENOUS | Status: AC
  Administered 2023-12-21: 2 g via INTRAVENOUS
  Filled 2023-12-21: qty 20

## 2023-12-21 MED ORDER — FUROSEMIDE 10 MG/ML IJ SOLN
20.0000 mg | Freq: Once | INTRAMUSCULAR | Status: AC
  Administered 2023-12-21: 20 mg via INTRAVENOUS
  Filled 2023-12-21: qty 2

## 2023-12-21 MED ORDER — SERTRALINE HCL 50 MG PO TABS
50.0000 mg | ORAL_TABLET | Freq: Every day | ORAL | Status: DC
  Administered 2023-12-21 – 2023-12-23 (×3): 50 mg via ORAL
  Filled 2023-12-21 (×4): qty 1

## 2023-12-21 MED ORDER — EMPAGLIFLOZIN 10 MG PO TABS
10.0000 mg | ORAL_TABLET | Freq: Every day | ORAL | Status: DC
  Administered 2023-12-21 – 2023-12-23 (×3): 10 mg via ORAL
  Filled 2023-12-21 (×4): qty 1

## 2023-12-21 MED ORDER — TRAMADOL HCL 50 MG PO TABS: 50.0000 mg | ORAL_TABLET | Freq: Every day | ORAL | Status: DC | PRN

## 2023-12-21 MED ORDER — SODIUM CHLORIDE 0.9 % IV SOLN
500.0000 mg | Freq: Once | INTRAVENOUS | Status: AC
  Administered 2023-12-21: 500 mg via INTRAVENOUS
  Filled 2023-12-21: qty 5

## 2023-12-21 MED ORDER — DONEPEZIL HCL 5 MG PO TABS
5.0000 mg | ORAL_TABLET | Freq: Every day | ORAL | Status: DC
Start: 2023-12-21 — End: 2023-12-23
  Administered 2023-12-21 – 2023-12-22 (×2): 5 mg via ORAL
  Filled 2023-12-21 (×2): qty 1

## 2023-12-21 MED ORDER — SODIUM CHLORIDE 0.9 % IV SOLN
500.0000 mg | INTRAVENOUS | Status: DC
  Administered 2023-12-22 – 2023-12-23 (×2): 500 mg via INTRAVENOUS
  Filled 2023-12-21 (×2): qty 5

## 2023-12-21 MED ORDER — ACETAMINOPHEN 650 MG RE SUPP: 650.0000 mg | Freq: Four times a day (QID) | RECTAL | Status: DC | PRN

## 2023-12-21 MED ORDER — MIRTAZAPINE 15 MG PO TABS
15.0000 mg | ORAL_TABLET | Freq: Every day | ORAL | Status: DC
  Administered 2023-12-21 – 2023-12-22 (×2): 15 mg via ORAL
  Filled 2023-12-21 (×3): qty 1

## 2023-12-21 MED ORDER — PINDOLOL 5 MG PO TABS
2.5000 mg | ORAL_TABLET | Freq: Two times a day (BID) | ORAL | Status: DC
  Administered 2023-12-21 – 2023-12-23 (×4): 2.5 mg via ORAL
  Filled 2023-12-21 (×6): qty 1

## 2023-12-21 MED ORDER — SODIUM CHLORIDE 0.9% FLUSH
3.0000 mL | Freq: Two times a day (BID) | INTRAVENOUS | Status: DC
  Administered 2023-12-21 – 2023-12-22 (×4): 3 mL via INTRAVENOUS

## 2023-12-21 MED ORDER — GUAIFENESIN ER 600 MG PO TB12
600.0000 mg | ORAL_TABLET | Freq: Two times a day (BID) | ORAL | Status: DC
  Administered 2023-12-21 – 2023-12-22 (×3): 600 mg via ORAL
  Filled 2023-12-21 (×3): qty 1

## 2023-12-21 MED ORDER — CEFTRIAXONE SODIUM 1 G IJ SOLR
1.0000 g | INTRAMUSCULAR | Status: DC
  Administered 2023-12-22 – 2023-12-23 (×2): 1 g via INTRAVENOUS
  Filled 2023-12-21 (×2): qty 10

## 2023-12-21 MED ORDER — MIRABEGRON ER 50 MG PO TB24
50.0000 mg | ORAL_TABLET | Freq: Every day | ORAL | Status: DC
Start: 2023-12-21 — End: 2023-12-23
  Administered 2023-12-21 – 2023-12-22 (×2): 50 mg via ORAL
  Filled 2023-12-21 (×3): qty 1

## 2023-12-21 MED ORDER — ROPINIROLE HCL 0.5 MG PO TABS
0.5000 mg | ORAL_TABLET | Freq: Every day | ORAL | Status: DC
  Administered 2023-12-21 – 2023-12-22 (×2): 0.5 mg via ORAL
  Filled 2023-12-21 (×2): qty 1

## 2023-12-21 MED ORDER — PROBIOTIC DAILY PO CAPS: 1.0000 | ORAL_CAPSULE | Freq: Every day | ORAL | Status: DC

## 2023-12-21 MED ORDER — AMOXICILLIN-POT CLAVULANATE 500-125 MG PO TABS: 1.0000 | ORAL_TABLET | Freq: Two times a day (BID) | ORAL | Status: DC

## 2023-12-21 MED ORDER — ALBUTEROL SULFATE (2.5 MG/3ML) 0.083% IN NEBU: 2.5000 mg | INHALATION_SOLUTION | RESPIRATORY_TRACT | Status: DC | PRN

## 2023-12-21 MED ORDER — ACETAMINOPHEN 325 MG PO TABS
650.0000 mg | ORAL_TABLET | Freq: Four times a day (QID) | ORAL | Status: DC | PRN
  Administered 2023-12-21: 650 mg via ORAL
  Filled 2023-12-21: qty 2

## 2023-12-21 MED ORDER — LINACLOTIDE 145 MCG PO CAPS: 145.0000 ug | ORAL_CAPSULE | Freq: Every day | ORAL | Status: DC | PRN

## 2023-12-21 MED ORDER — IPRATROPIUM BROMIDE 0.06 % NA SOLN
2.0000 | Freq: Every day | NASAL | Status: DC | PRN
  Filled 2023-12-21: qty 15

## 2023-12-21 NOTE — ED Triage Notes (Signed)
PT BIB [facility] from [facility] With a c/o SOB. PT was in the bed and had sudden sob, PT's initial O2 was 88%, fire and rescue placed her on a non-rebreather at 12L and PT's stats came up to 100%. EMS put her through a air tolerance test and PT destated to 88-90% within 6 min. PT has a hx of CHF and afib. PT did not take any of her nighttime medications due to not feeling well  and these include her lasix. Upon arrival PT ambulated to the bathroom with minimal assistance and was able to produce a urine sample.

## 2023-12-21 NOTE — ED Notes (Signed)
Admitting MD at BS.  

## 2023-12-21 NOTE — H&P (Addendum)
History and Physical    Patient: Heather Olsen VWU:[id number] DOB: 20-Dec-1930 DOA: 12/21/2023 DOS: the patient was seen and examined on 12/21/2023 PCP: Merri Brunette, MD  Patient coming from: [facility] via EMS  Chief Complaint:  Chief Complaint  Patient presents with   Shortness of Breath   HPI: Heather Olsen is a 89 y.o. female with medical history significant of aortic stenosis, mitral regurgitation, CAD, depression, hiatal hernia, hyperlipidemia, hyperglycemia, hypertension, idiopathic scoliosis, osteoporosis, pernicious anemia, Raynaud's disease, and spinal stenosis of lumbar region presents with complaints of shortness of breath.  Symptoms started this morning, but did not wake her out of her sleep.  Denies having any associated cough, fever, nausea, vomiting, recent sick contacts, or diarrhea.  Recently hospitalized from 1/6-1/8 with acute diverticulitis of the distal descending colon and sigmoid colon.  Patient was treated with Rocephin and Flagyl with improvement in symptoms ultimately discharged home a ciprofloxacin and metronidazole to complete course.  It appears that hospitalization she was recommended to hold her diuretic.  On records note she was seen back in the emergency department off 1/20 with complaints of left lower quadrant pain.  CT scan of the abdomen pelvis at that time noted decreased inflammatory changes in the descending and sigmoid colon with mild residual wall thickening in the sigmoid colon without perforation.  Then was seen again yesterday afternoon due to persistent left lower quadrant abdominal pain.  Patient was given Augmentin yesterday afternoon.  Patient reports that those symptoms have since resolved and she no longer has the abdominal pain.   In the emergency department patient was noted to be afebrile with respirations 16-31, blood pressures maintained, and O2 saturations as low as 81% with ambulation.  Labs noted hemoglobin 10.8, platelets  416, sodium 131, chloride 94, BUN 20, creatinine 1.01, glucose 150, BNP 490.2, high-sensitivity troponin 21->23.  Chest x-ray noted infiltrates in the right lower lung suspicious for aspiration or infection.  Influenza, COVID-19, and RSV screening were negative.  Patient had been given empiric antibiotics of Rocephin, azithromycin, and Lasix 20 mg IV.  Review of Systems: As mentioned in the history of present illness. All other systems reviewed and are negative. Past Medical History:  Diagnosis Date   Aortic stenosis 04/12/2016   none noted on echo done 08/13/20   Coronary artery disease    Depression    Diverticulosis    Hearing loss 04/12/2016   Hiatal hernia 04/12/2016   HLD (hyperlipidemia) 04/12/2016   Hyperglycemia 09/14/2016   Hypertension    Idiopathic scoliosis 04/12/2016   Major depression 04/12/2016   Mitral regurgitation    Osteoporosis, senile 04/12/2016   Pernicious anemia    Raynaud's disease 04/12/2016   Scoliosis    Spinal stenosis of lumbar region 04/12/2016   Past Surgical History:  Procedure Laterality Date   APPENDECTOMY     BREAST SURGERY     broken wrist Right 2007   CARPAL TUNNEL RELEASE Left 04/04/2017   Procedure: LEFT CARPAL TUNNEL RELEASE;  Surgeon: Cindee Salt, MD;  Location: [facility];  Service: Orthopedics;  Laterality: Left;  REG/FAB   CARPAL TUNNEL RELEASE Right 12/01/2020   Procedure: CARPAL TUNNEL RELEASE;  Surgeon: Cindee Salt, MD;  Location: [facility];  Service: Orthopedics;  Laterality: Right;  IV REGIONAL FOREARM BLOCK   CATARACT EXTRACTION W/ INTRAOCULAR LENS  IMPLANT, BILATERAL Bilateral 2017   COLONOSCOPY  2016   FOOT SURGERY Right    PERIPHERAL VASCULAR INTERVENTION  11/11/2019   Procedure: PERIPHERAL  VASCULAR INTERVENTION;  Surgeon: Maeola Harman, MD;  Location: [facility] INVASIVE CV LAB;  Service: Cardiovascular;;   TONSILLECTOMY     VISCERAL ANGIOGRAPHY N/A 11/11/2019   Procedure: VISCERAL ANGIOGRAPHY;   Surgeon: Maeola Harman, MD;  Location: [facility] INVASIVE CV LAB;  Service: Cardiovascular;  Laterality: N/A;   Social History:  reports that she has never smoked. She has never been exposed to tobacco smoke. She has never used smokeless tobacco. She reports current alcohol use. She reports that she does not use drugs.  Allergies  Allergen Reactions   Sulfasalazine Other (See Comments)    Other reaction(s): Other (See Comments), headaches, headaches, headaches   Topiramate Rash   Ace Inhibitors Cough   Codeine Nausea Only   Ibandronic Acid Nausea Only   Meloxicam     Other reaction(s): GI upset, GI Upset (intolerance), Other (See Comments) unknown    Prevacid [Lansoprazole]     Chest pain   Sulfa Antibiotics Other (See Comments)    headaches   Verapamil Other (See Comments)    constipation   Zolpidem Other (See Comments)    hallucinations    Family History  Problem Relation Age of Onset   Heart disease Mother        Died 82, MR, no CAD   Heart disease Father        CHF, no CAD.[name]    HIV Daughter 63       from bone tissue transplant    Prior to Admission medications   Medication Sig Start Date End Date Taking? Authorizing Provider  acetaminophen (TYLENOL) 650 MG CR tablet Take 650 mg by mouth daily as needed for pain.    [provider]  amoxicillin-clavulanate (AUGMENTIN) 500-125 MG tablet Take 1 tablet by mouth 2 (two) times daily for 7 days. 12/20/23 12/27/23  Smoot, Shawn Route, PA-C  denosumab (PROLIA) 60 MG/ML SOSY injection Inject 60 mg into the skin every 6 (six) months. Patient not taking: Reported on 12/12/2023 07/25/19   [provider]  dicyclomine (BENTYL) 20 MG tablet Take 1 tablet (20 mg total) by mouth 2 (two) times daily. 12/20/23   Smoot, Sarah A, PA-C  donepezil (ARICEPT) 5 MG tablet Take 5 mg by mouth at bedtime. 11/20/23   [provider]  ELIQUIS 2.5 MG TABS tablet Take 2.5 mg by mouth 2 (two) times daily. 02/03/20   [provider]  empagliflozin (JARDIANCE) 10 MG TABS tablet Take 1 tablet (10 mg total) by mouth daily. 09/13/23   Zannie Cove, MD  furosemide (LASIX) 40 MG tablet Take 1 tablet (40 mg total) by mouth daily. 08/15/23   Rollene Rotunda, MD  ipratropium (ATROVENT) 0.06 % nasal spray Use 2 sprays in each nostril up to three times daily before meals for sinus drainage. Patient taking differently: Place 2 sprays into the nose daily as needed for rhinitis. 09/30/16   Kimber Relic, MD  LINZESS 145 MCG CAPS capsule Take 145 mcg by mouth daily as needed (constipation). 07/29/22   [provider]  metFORMIN (GLUCOPHAGE-XR) 500 MG 24 hr tablet Take 1,000 mg by mouth at bedtime. 02/01/22   [provider]  mirabegron ER (MYRBETRIQ) 50 MG TB24 tablet Take 50 mg by mouth at bedtime. 08/11/21   [provider]  mirtazapine (REMERON) 15 MG tablet Take 15 mg by mouth at bedtime. 11/14/23   [provider]  MULTIPLE VITAMINS-MINERALS PO Take 1 tablet by mouth daily.     [provider]  nitroGLYCERIN (  NITROSTAT) 0.4 MG SL tablet Place 1 tablet (0.4 mg total) under the tongue every 5 (five) minutes as needed for chest pain. Place one tablet under the tongue every 5 minutes as needed for chest pain. No more than 3 08/09/22   Rollene Rotunda, MD  pantoprazole (PROTONIX) 40 MG tablet Take 40 mg by mouth 2 (two) times daily. 12/18/19   [provider]  pindolol (VISKEN) 5 MG tablet Take 1/2 (one-half) tablet by mouth twice daily Patient taking differently: Take 2.5 mg by mouth 2 (two) times daily. 07/04/23   Rollene Rotunda, MD  potassium chloride SA (KLOR-CON M) 20 MEQ tablet Take 1 tablet (20 mEq total) by mouth daily. 09/13/23   Zannie Cove, MD  Probiotic Product (PROBIOTIC DAILY PO) Take 1 capsule by mouth at bedtime.    [provider]  rOPINIRole (REQUIP) 0.5 MG tablet Take 0.5 mg by mouth at bedtime. 07/13/22   [provider]  rosuvastatin  (CRESTOR) 10 MG tablet TAKE 1 TABLET BY MOUTH AT BEDTIME Patient taking differently: Take 10 mg by mouth at bedtime. 11/23/20   Maeola Harman, MD  sertraline (ZOLOFT) 50 MG tablet Take 50 mg by mouth daily.    [provider]  traMADol (ULTRAM) 50 MG tablet TAKE 1 TABLET BY MOUTH EVERY 4 HOURS Patient taking differently: Take 50 mg by mouth daily as needed for moderate pain (pain score 4-6) or severe pain (pain score 7-10). 11/14/16   Reed, Tiffany L, DO  VITAMIN D PO Take 1 capsule by mouth at bedtime.    [provider]    Physical Exam: Vitals:   12/21/23 0615 12/21/23 0645 12/21/23 0700 12/21/23 0819  BP: (!) 105/54 123/83 115/62 (!) 146/89  Pulse: 80 82 80 84  Resp: (!) 31 (!) 21 (!) 28 17  Temp: 98.1 F (36.7 C)     TempSrc: Oral     SpO2: 98% 100% 91% 100%  Weight:      Height:       Constitutional: Elderly female NAD, calm, comfortable Eyes: PERRL, lids and conjunctivae normal ENMT: Mucous membranes are moist. Posterior pharynx clear of any exudate or lesions.Normal dentition.  Neck: normal, supple  Respiratory: clear to auscultation bilaterally, no wheezing, no crackles. Normal respiratory effort. No accessory muscle use.  Cardiovascular: Regular rate and rhythm, no murmurs / rubs / gallops. No extremity edema. 2+ pedal pulses. No carotid bruits.  Abdomen: no tenderness, no masses palpated.   Bowel sounds positive.  Musculoskeletal: no clubbing / cyanosis. No joint deformity upper and lower extremities. Good ROM, no contractures. Normal muscle tone.  Skin: no rashes, lesions, ulcers. No induration Neurologic: CN 2-12 grossly intact. . Strength 5/5 in all 4.  Psychiatric: Alert and oriented x person and place.  Normal mood.   Data Reviewed:  EKG reveals sinus rhythm at 79 bpm with premature atrial complex and prolonged PR interval.  Reviewed labs, imaging, and pertinent records as documented.  Assessment and Plan:   Respiratory failure  with hypoxia Suspected pneumonia Acute.  Patient presents with acute onset of shortness of breath this morning.  O2 saturations noted to drop as low as 81% with ambulation.  Chest x-ray noted infiltration of the right lower lung suspicious for aspiration or infection.  BNP was elevated at 190.  Influenza, COVID-19, and RSV screening were negative.  Patient had been started on empiric antibiotics of Rocephin and azithromycin.  Question possibility of pneumonia  -Admit to a telemetry bed -Nasal cannula oxygen to  maintain O2 saturation greater than 92% -Aspiration precautions with elevation head of bed -Incentive spirometry and flutter valve -Check procalcitonin -Continue empiric antibiotics of Rocephin and azithromycin -Albuterol nebs as needed for shortness of breath/wheezing  Elevated troponin High-sensitivity troponins essentially flat 21->23.  EKG did not note significant ischemic changes.  Patient denied any reports of chest pain.  Low suspicion for any embolism at this time. -Add on D-dimer  Diastolic congestive heart failure Question acute on chronic.  BNP elevated at 490.2.  Last echocardiogram noted EF to be 55 to 60% with grade 2 diastolic dysfunction when last checked 06/2023.[name]  She had been told to hold Lasix following last hospitalization.  She had been given Lasix 20 mg IV x 1 dose in the ED. -Strict I&Os and daily weights -Reassess and determine need of continued IV diuresis  History of diverticulitis Patient had just recently been hospitalized for diverticulitis earlier this month.  Subsequently seen in the ED on 1/20 with reports of continued abdominal pain left lower quadrant.  Started on Augmentin yesterday.  Patient denies continued abdominal pain at this time.  Records note prior hospitalization back in September with sigmoid diverticulitis. -Held Augmentin  Macrocytic anemia Chronic.  Hemoglobin 10.8 which appears lower than previous check yesterday of 11.4.  Patient  denied any reports of bleeding. -Recheck H&H  Paroxysmal atrial fibrillation on chronic anticoagulation Patient appears to be in a sinus rhythm at this time. -Continue beta-blocker and Eliquis  Essential hypertension Blood pressures currently maintained. -Continue home regimen  Thrombocytosis Acute.  Platelet count elevated at 416. -Continue to monitor.  Controlled diabetes mellitus type 2, without long-term use of insulin On admission glucose noted to be 150. -Hypoglycemic protocol -Hold metformin -Continue Januvia  Dementia -Delirium precautions -Continue Aricept  Hyperlipidemia -Continue Crestor  Hyponatremia Sodium noted to be 131.  Possibly secondary to a hypervolemic hyponatremia. -Recheck sodium levels in a.m.  DVT prophylaxis: Eliquis Advance Care Planning:   Code Status: Full Code    Consults: None  Family Communication: Called patient's son, but voicemail full  Severity of Illness: The appropriate patient status for this patient is OBSERVATION. Observation status is judged to be reasonable and necessary in order to provide the required intensity of service to ensure the patient's safety. The patient's presenting symptoms, physical exam findings, and initial radiographic and laboratory data in the context of their medical condition is felt to place them at decreased risk for further clinical deterioration. Furthermore, it is anticipated that the patient will be medically stable for discharge from the hospital within 2 midnights of admission.   Author: Clydie Braun, MD 12/21/2023 9:00 AM  For on call review [email or web address].

## 2023-12-21 NOTE — ED Notes (Signed)
Patient was walked from her room down the hallway to room 20 and back. Patient o2 sat started at 96% standing. Patients o2 sat dropped down to 91% while walking to room 20 and dropped down to 81% on the way back to her room. Patient stated she did not feel short of breath but she felt like she was breathing faster. Patients o2 sat rose back to 96% after getting back in the bed and resting for 

## 2023-12-21 NOTE — Discharge Instructions (Addendum)
See new medications.  Practice your deep breathing exercises at home 5-6 times every hour while awake. Contact Shirleen Schirmer' office to schedule gastrointestinal follow-up appointment at your earliest convenience.  Mayer Masker. Norton Pastel [facility] [place] [phone], [place] [address] Directions [phone]

## 2023-12-21 NOTE — ED Provider Notes (Signed)
Blood pressure 115/62, pulse 80, temperature 98.1 F (36.7 C), temperature source Oral, resp. rate (!) 28, height 5\' 6"  (1.676 m), weight 48.5 kg, SpO2 91%.  Assuming care from Dr. Preston Fleeting.  In short, Heather Olsen is a 89 y.o. female with a chief complaint of Shortness of Breath .  Refer to the original H&P for additional details.  The current plan of care is to follow up on ambulation with pulse ox.  08:40 AM  Patient ambulatory in the department but desaturating to 81% with ambulation on RA. No symptoms. BNP slightly elevated. No hypoxemia at rest. Plan for gentle diuresis and admit for observation. Patient compliant with anticoagulation, doubt PE clinically, although considered.   Discussed patient's case with [facility] to request admission. Patient and family (if present) updated with plan. Care transferred to [facility] service.  I reviewed all nursing notes, vitals, pertinent old records, EKGs, labs, imaging (as available).    Maia Plan, MD 12/21/23 [phone]

## 2023-12-21 NOTE — ED Provider Notes (Signed)
[place] [facility] AT [facility] Provider Note   CSN: [id number] Arrival date & time: 12/21/23  3235     History  Chief Complaint  Patient presents with   Shortness of Breath    Heather Olsen is a 89 y.o. female.  The history is provided by the patient.  Shortness of Breath She has history of hypertension, hyperlipidemia, chronic kidney disease, coronary artery disease, diastolic heart failure, paroxysmal atrial fibrillation anticoagulated on apixaban and currently getting antibiotics for an episode of diverticulitis came in by ambulance because of shortness of breath at home.  She was seen in the emergency department this afternoon because of ongoing left lower quadrant pain and had negative evaluation and was discharged home.  EMS did report oxygen saturation of 88% which improved on oxygen.  She now feels like she is back to her baseline.  She denies chest pain, heaviness, tightness, pressure.  She denies any cough.   Home Medications Prior to Admission medications   Medication Sig Start Date End Date Taking? Authorizing Provider  acetaminophen (TYLENOL) 650 MG CR tablet Take 650 mg by mouth daily as needed for pain.    [provider]  amoxicillin-clavulanate (AUGMENTIN) 500-125 MG tablet Take 1 tablet by mouth 2 (two) times daily for 7 days. 12/20/23 12/27/23  Smoot, Shawn Route, PA-C  denosumab (PROLIA) 60 MG/ML SOSY injection Inject 60 mg into the skin every 6 (six) months. Patient not taking: Reported on 12/12/2023 07/25/19   [provider]  dicyclomine (BENTYL) 20 MG tablet Take 1 tablet (20 mg total) by mouth 2 (two) times daily. 12/20/23   Smoot, Sarah A, PA-C  donepezil (ARICEPT) 5 MG tablet Take 5 mg by mouth at bedtime. 11/20/23   [provider]  ELIQUIS 2.5 MG TABS tablet Take 2.5 mg by mouth 2 (two) times daily. 02/03/20   [provider]  empagliflozin (JARDIANCE) 10 MG TABS tablet Take 1 tablet (10 mg total) by mouth  daily. 09/13/23   Zannie Cove, MD  furosemide (LASIX) 40 MG tablet Take 1 tablet (40 mg total) by mouth daily. 08/15/23   Rollene Rotunda, MD  ipratropium (ATROVENT) 0.06 % nasal spray Use 2 sprays in each nostril up to three times daily before meals for sinus drainage. Patient taking differently: Place 2 sprays into the nose daily as needed for rhinitis. 09/30/16   Kimber Relic, MD  LINZESS 145 MCG CAPS capsule Take 145 mcg by mouth daily as needed (constipation). 07/29/22   [provider]  metFORMIN (GLUCOPHAGE-XR) 500 MG 24 hr tablet Take 1,000 mg by mouth at bedtime. 02/01/22   [provider]  mirabegron ER (MYRBETRIQ) 50 MG TB24 tablet Take 50 mg by mouth at bedtime. 08/11/21   [provider]  mirtazapine (REMERON) 15 MG tablet Take 15 mg by mouth at bedtime. 11/14/23   [provider]  MULTIPLE VITAMINS-MINERALS PO Take 1 tablet by mouth daily.     [provider]  nitroGLYCERIN (NITROSTAT) 0.4 MG SL tablet Place 1 tablet (0.4 mg total) under the tongue every 5 (five) minutes as needed for chest pain. Place one tablet under the tongue every 5 minutes as needed for chest pain. No more than 3 08/09/22   Rollene Rotunda, MD  pantoprazole (PROTONIX) 40 MG tablet Take 40 mg by mouth 2 (two) times daily. 12/18/19   [provider]  pindolol (VISKEN) 5 MG tablet Take 1/2 (one-half) tablet by mouth twice daily Patient taking differently: Take 2.5 mg  by mouth 2 (two) times daily. 07/04/23   Rollene Rotunda, MD  potassium chloride SA (KLOR-CON M) 20 MEQ tablet Take 1 tablet (20 mEq total) by mouth daily. 09/13/23   Zannie Cove, MD  Probiotic Product (PROBIOTIC DAILY PO) Take 1 capsule by mouth at bedtime.    [provider]  rOPINIRole (REQUIP) 0.5 MG tablet Take 0.5 mg by mouth at bedtime. 07/13/22   [provider]  rosuvastatin (CRESTOR) 10 MG tablet TAKE 1 TABLET BY MOUTH AT BEDTIME Patient taking differently: Take 10 mg by  mouth at bedtime. 11/23/20   Maeola Harman, MD  sertraline (ZOLOFT) 50 MG tablet Take 50 mg by mouth daily.    [provider]  traMADol (ULTRAM) 50 MG tablet TAKE 1 TABLET BY MOUTH EVERY 4 HOURS Patient taking differently: Take 50 mg by mouth daily as needed for moderate pain (pain score 4-6) or severe pain (pain score 7-10). 11/14/16   Reed, Tiffany L, DO  VITAMIN D PO Take 1 capsule by mouth at bedtime.    [provider]      Allergies    Sulfasalazine, Topiramate, Ace inhibitors, Codeine, Ibandronic acid, Meloxicam, Prevacid [lansoprazole], Sulfa antibiotics, Verapamil, and Zolpidem    Review of Systems   Review of Systems  Respiratory:  Positive for shortness of breath.   All other systems reviewed and are negative.   Physical Exam Updated Vital Signs BP 135/76   Resp (!) 27   Ht 5\' 6"  (1.676 m)   Wt 48.5 kg   BMI 17.26 kg/m  Physical Exam Vitals and nursing note reviewed.   89 year old female, resting comfortably and in no acute distress. Vital signs are significant for slightly elevated respiratory rate. Oxygen saturation is 91% on room air, which is normal. Head is normocephalic and atraumatic. PERRLA, EOMI. Oropharynx is clear. Neck is nontender and supple without adenopathy or JVD. Lungs are clear without rales, wheezes, or rhonchi. Chest is nontender. Heart has regular rate and rhythm with 3/6 systolic ejection murmur best heard at the upper left sternal border. Abdomen is soft, flat, with mild to moderate left lower quadrant tenderness consistent with known history of recent exacerbation of diverticulitis. Extremities have no cyanosis or edema, full range of motion is present. Skin is warm and dry without rash. Neurologic: Mental status is normal, moves all extremities equally.  ED Results / Procedures / Treatments   Labs (all labs ordered are listed, but only abnormal results are displayed) Labs Reviewed  BASIC METABOLIC PANEL -  Abnormal; Notable for the following components:      Result Value   Sodium 131 (*)    Chloride 94 (*)    Glucose, Bld 150 (*)    Creatinine, Ser 1.01 (*)    GFR, Estimated 53 (*)    All other components within normal limits  CBC WITH DIFFERENTIAL/PLATELET - Abnormal; Notable for the following components:   RBC 3.28 (*)    Hemoglobin 10.8 (*)    HCT 33.0 (*)    MCV 100.6 (*)    Platelets 416 (*)    All other components within normal limits  BRAIN NATRIURETIC PEPTIDE - Abnormal; Notable for the following components:   B Natriuretic Peptide 490.2 (*)    All other components within normal limits  TROPONIN I (HIGH SENSITIVITY) - Abnormal; Notable for the following components:   Troponin I (High Sensitivity) 21 (*)    All other components within normal limits  TROPONIN I (HIGH SENSITIVITY) - Abnormal;  Notable for the following components:   Troponin I (High Sensitivity) 23 (*)    All other components within normal limits    EKG EKG Interpretation Date/Time:  Thursday December 21 2023 02:26:19 EST Ventricular Rate:  79 PR Interval:  269 QRS Duration:  92 QT Interval:  419 QTC Calculation: 481 R Axis:   -63  Text Interpretation: Sinus rhythm Atrial premature complex Prolonged PR interval Left anterior fascicular block RSR' in V1 or V2, right VCD or RVH LVH with secondary repolarization abnormality When compared with ECG of 12/04/2023, Premature atrial complexes are no longer present Confirmed by Dione Booze ([id number]) on 12/21/2023 2:44:05 AM  Radiology DG Chest Portable 1 View Result Date: 12/20/2023 CLINICAL DATA:  Short of breath, abdominal pain for several days, history of diverticulitis EXAM: PORTABLE CHEST 1 VIEW COMPARISON:  12/04/2023 FINDINGS: Single frontal view of the chest demonstrates a stable cardiac silhouette. No airspace disease, effusion, or pneumothorax. Stable right convex thoracic scoliosis. No acute bony abnormality. IMPRESSION: 1. Stable chest, no acute process.  Electronically Signed   By: Sharlet Salina M.D.   On: 12/20/2023 19:24    Procedures Procedures  Cardiac monitor shows normal sinus rhythm, per my interpretation.  Medications Ordered in ED Medications - No data to display  ED Course/ Medical Decision Making/ A&P                                 Medical Decision Making Amount and/or Complexity of Data Reviewed Labs: ordered. Radiology: ordered.   Episode of dyspnea which appears to have resolved.  Consider ACS, pneumonia, mucous plug which has been dislodged, heart failure exacerbation.  I have reviewed her past records and she has ED visits on 12/18/2023 and 12/20/2023 for left lower quadrant pain.  CT scan of abdomen and pelvis on 12/18/2023 showed cardiomegaly but no lung consolidation, decreasing inflammatory changes around the sigmoid colon and descending colon.  Chest x-ray on 11/2020 showed stable chest x-ray, no evidence of pneumonia.  Chest x-ray here is read by radiologist as infiltrates in the right lower lobe suspicious for aspiration or infection.  I have independently viewed the image, and per my reading it is unchanged from her recent chest x-ray.  She is currently on antibiotics for diverticulitis which would be appropriate for pneumonia.  I have reviewed her electrocardiogram, and my interpretation is LVH with secondary repolarization changes not significantly changed from prior.  I have reviewed her laboratory tests, my interpretation is mild hyponatremia which is stable and not felt to be clinically significant, borderline elevated creatinine which is stable and not felt to be clinically significant, stable mild elevation of BNP, stable mild elevation of troponin with no change on repeat, macrocytic anemia.  Patient has maintained adequate oxygen saturation throughout her stay without supplemental oxygen.  I have ordered a trial of ambulation while checking pulse oximetry.  If she is able to ambulate without oxygen desaturation, I  feel she can safely go home to continue her outpatient antibiotics.  Final Clinical Impression(s) / ED Diagnoses Final diagnoses:  Shortness of breath  Macrocytic anemia  Hyponatremia  Elevated troponin    Rx / DC Orders ED Discharge Orders     None         Dione Booze, MD 12/21/23 0730

## 2023-12-21 NOTE — ED Notes (Signed)
Up to b/r with standby assist, slow cautious steady gait, wearing non-skid socks.

## 2023-12-22 DIAGNOSIS — J984 Other disorders of lung: Secondary | ICD-10-CM | POA: Diagnosis not present

## 2023-12-22 DIAGNOSIS — F0393 Unspecified dementia, unspecified severity, with mood disturbance: Secondary | ICD-10-CM | POA: Diagnosis present

## 2023-12-22 DIAGNOSIS — Z7901 Long term (current) use of anticoagulants: Secondary | ICD-10-CM | POA: Diagnosis not present

## 2023-12-22 DIAGNOSIS — I480 Paroxysmal atrial fibrillation: Secondary | ICD-10-CM | POA: Diagnosis present

## 2023-12-22 DIAGNOSIS — D539 Nutritional anemia, unspecified: Secondary | ICD-10-CM

## 2023-12-22 DIAGNOSIS — E119 Type 2 diabetes mellitus without complications: Secondary | ICD-10-CM | POA: Diagnosis present

## 2023-12-22 DIAGNOSIS — J189 Pneumonia, unspecified organism: Secondary | ICD-10-CM | POA: Diagnosis present

## 2023-12-22 DIAGNOSIS — Z1152 Encounter for screening for COVID-19: Secondary | ICD-10-CM | POA: Diagnosis not present

## 2023-12-22 DIAGNOSIS — E1122 Type 2 diabetes mellitus with diabetic chronic kidney disease: Secondary | ICD-10-CM

## 2023-12-22 DIAGNOSIS — N1831 Chronic kidney disease, stage 3a: Secondary | ICD-10-CM

## 2023-12-22 DIAGNOSIS — E785 Hyperlipidemia, unspecified: Secondary | ICD-10-CM | POA: Diagnosis present

## 2023-12-22 DIAGNOSIS — Z885 Allergy status to narcotic agent status: Secondary | ICD-10-CM | POA: Diagnosis not present

## 2023-12-22 DIAGNOSIS — Z79899 Other long term (current) drug therapy: Secondary | ICD-10-CM | POA: Diagnosis not present

## 2023-12-22 DIAGNOSIS — K5732 Diverticulitis of large intestine without perforation or abscess without bleeding: Secondary | ICD-10-CM | POA: Diagnosis present

## 2023-12-22 DIAGNOSIS — D75839 Thrombocytosis, unspecified: Secondary | ICD-10-CM

## 2023-12-22 DIAGNOSIS — F0390 Unspecified dementia without behavioral disturbance: Secondary | ICD-10-CM | POA: Diagnosis not present

## 2023-12-22 DIAGNOSIS — Z8719 Personal history of other diseases of the digestive system: Secondary | ICD-10-CM

## 2023-12-22 DIAGNOSIS — I110 Hypertensive heart disease with heart failure: Secondary | ICD-10-CM | POA: Diagnosis present

## 2023-12-22 DIAGNOSIS — Y95 Nosocomial condition: Secondary | ICD-10-CM | POA: Diagnosis present

## 2023-12-22 DIAGNOSIS — F0394 Unspecified dementia, unspecified severity, with anxiety: Secondary | ICD-10-CM | POA: Diagnosis present

## 2023-12-22 DIAGNOSIS — J9601 Acute respiratory failure with hypoxia: Secondary | ICD-10-CM

## 2023-12-22 DIAGNOSIS — R0602 Shortness of breath: Secondary | ICD-10-CM | POA: Diagnosis present

## 2023-12-22 DIAGNOSIS — R7989 Other specified abnormal findings of blood chemistry: Secondary | ICD-10-CM | POA: Diagnosis not present

## 2023-12-22 DIAGNOSIS — I2510 Atherosclerotic heart disease of native coronary artery without angina pectoris: Secondary | ICD-10-CM | POA: Diagnosis present

## 2023-12-22 DIAGNOSIS — E876 Hypokalemia: Secondary | ICD-10-CM | POA: Diagnosis present

## 2023-12-22 DIAGNOSIS — I5033 Acute on chronic diastolic (congestive) heart failure: Secondary | ICD-10-CM | POA: Diagnosis present

## 2023-12-22 DIAGNOSIS — Z7984 Long term (current) use of oral hypoglycemic drugs: Secondary | ICD-10-CM | POA: Diagnosis not present

## 2023-12-22 DIAGNOSIS — Z8249 Family history of ischemic heart disease and other diseases of the circulatory system: Secondary | ICD-10-CM | POA: Diagnosis not present

## 2023-12-22 DIAGNOSIS — D75838 Other thrombocytosis: Secondary | ICD-10-CM | POA: Diagnosis present

## 2023-12-22 DIAGNOSIS — M7989 Other specified soft tissue disorders: Secondary | ICD-10-CM | POA: Diagnosis not present

## 2023-12-22 DIAGNOSIS — Z961 Presence of intraocular lens: Secondary | ICD-10-CM | POA: Diagnosis present

## 2023-12-22 DIAGNOSIS — E871 Hypo-osmolality and hyponatremia: Secondary | ICD-10-CM | POA: Diagnosis present

## 2023-12-22 DIAGNOSIS — I10 Essential (primary) hypertension: Secondary | ICD-10-CM | POA: Diagnosis not present

## 2023-12-22 DIAGNOSIS — F32A Depression, unspecified: Secondary | ICD-10-CM | POA: Diagnosis present

## 2023-12-22 LAB — CBC
HCT: 34.6 % — ABNORMAL LOW (ref 36.0–46.0)
Hemoglobin: 11.3 g/dL — ABNORMAL LOW (ref 12.0–15.0)
MCH: 32.8 pg (ref 26.0–34.0)
MCHC: 32.7 g/dL (ref 30.0–36.0)
MCV: 100.3 fL — ABNORMAL HIGH (ref 80.0–100.0)
Platelets: 424 10*3/uL — ABNORMAL HIGH (ref 150–400)
RBC: 3.45 MIL/uL — ABNORMAL LOW (ref 3.87–5.11)
RDW: 14.4 % (ref 11.5–15.5)
WBC: 5.1 10*3/uL (ref 4.0–10.5)
nRBC: 0 % (ref 0.0–0.2)

## 2023-12-22 LAB — BASIC METABOLIC PANEL
Anion gap: 12 (ref 5–15)
BUN: 14 mg/dL (ref 8–23)
CO2: 26 mmol/L (ref 22–32)
Calcium: 9.2 mg/dL (ref 8.9–10.3)
Chloride: 102 mmol/L (ref 98–111)
Creatinine, Ser: 0.98 mg/dL (ref 0.44–1.00)
GFR, Estimated: 55 mL/min — ABNORMAL LOW (ref >60)
Glucose, Bld: 146 mg/dL — ABNORMAL HIGH (ref 70–99)
Potassium: 3.3 mmol/L — ABNORMAL LOW (ref 3.5–5.1)
Sodium: 140 mmol/L (ref 135–145)

## 2023-12-22 LAB — MAGNESIUM: Magnesium: 1.4 mg/dL — ABNORMAL LOW (ref 1.7–2.4)

## 2023-12-22 LAB — PHOSPHORUS: Phosphorus: 3.9 mg/dL (ref 2.5–4.6)

## 2023-12-22 MED ORDER — MAGNESIUM SULFATE 4 GM/100ML IV SOLN
4.0000 g | Freq: Once | INTRAVENOUS | Status: AC
  Administered 2023-12-22: 4 g via INTRAVENOUS
  Filled 2023-12-22: qty 100

## 2023-12-22 MED ORDER — GUAIFENESIN ER 600 MG PO TB12
1200.0000 mg | ORAL_TABLET | Freq: Two times a day (BID) | ORAL | Status: DC
  Administered 2023-12-22 – 2023-12-23 (×2): 1200 mg via ORAL
  Filled 2023-12-22 (×3): qty 2

## 2023-12-22 MED ORDER — LEVALBUTEROL HCL 0.63 MG/3ML IN NEBU
0.6300 mg | INHALATION_SOLUTION | Freq: Four times a day (QID) | RESPIRATORY_TRACT | Status: DC
  Filled 2023-12-22: qty 3

## 2023-12-22 MED ORDER — METRONIDAZOLE 500 MG/100ML IV SOLN
500.0000 mg | Freq: Two times a day (BID) | INTRAVENOUS | Status: DC
  Administered 2023-12-22 – 2023-12-23 (×2): 500 mg via INTRAVENOUS
  Filled 2023-12-22 (×4): qty 100

## 2023-12-22 MED ORDER — POTASSIUM CHLORIDE CRYS ER 20 MEQ PO TBCR
40.0000 meq | EXTENDED_RELEASE_TABLET | Freq: Two times a day (BID) | ORAL | Status: AC
  Administered 2023-12-22 (×2): 40 meq via ORAL
  Filled 2023-12-22 (×2): qty 2

## 2023-12-22 MED ORDER — IPRATROPIUM BROMIDE 0.02 % IN SOLN
0.5000 mg | Freq: Four times a day (QID) | RESPIRATORY_TRACT | Status: DC
  Filled 2023-12-22: qty 2.5

## 2023-12-22 NOTE — Progress Notes (Signed)
Mobility Specialist Progress Note;   12/22/23 1115  Mobility  Activity Ambulated with assistance in hallway  Level of Assistance Contact guard assist, steadying assist  Assistive Device None  Distance Ambulated (ft) 75 ft  Activity Response Tolerated well  Mobility Referral Yes  Mobility visit 1 Mobility  Mobility Specialist Start Time (ACUTE ONLY) 1115  Mobility Specialist Stop Time (ACUTE ONLY) 1125  Mobility Specialist Time Calculation (min) (ACUTE ONLY) 10 min   Pt agreeable to mobility. Required MinG assistance throughout ambulation for safety. Required 1x standing rest break d/t SOB. Unable to receive an accurate pleth reading throughout however per RN this has been going on for pt. Returned pt safely back to chair with all needs met, SPO2 100%. RN notified.   Caesar Bookman Mobility Specialist Please contact via SecureChat or [employer] [phone]

## 2023-12-22 NOTE — Hospital Course (Addendum)
Patient is a 89 year old Caucasian female with a past medical history significant for benign to aortic stenosis, mitral regurgitation, CAD, depression, hiatal hernia, hyperlipidemia, hyperglycemia, hypertension, idiopathic scoliosis, osteoporosis, pernicious anemia, Raynaud's disease, spinal stenosis of the lumbar spine as well as other comorbidities who presented with shortness of breath and chills.  Shortness of breath started yesterday morning but did not wake her out of her sleep.  She was recently hospitalized from 12/14/2023 until 12/06/2023 with acute diverticulitis of the distal descending colon.  She was treated with Rocephin and Flagyl with improvement in her symptoms and was discharged on Cipro Flagyl to complete the course and during the last hospitalization she was recommended to hold her diuretic.  She is seen back in the emergency department 12/18/2023 with complaints of left lower quadrant pain and CT scan of the abdomen pelvis noted decreased inflammatory changes in the descending and sigmoid colon with mild residual wall thickening in the sigmoid colon without perforation.  Subsequently she is seen again yesterday afternoon due to persistent left lower quadrant abdominal pain and was given Augmentin yesterday afternoon.  Patient reports those abdominal symptoms have resolved but given her shortness of breath she presented the ED and she is currently afebrile with respirations 16-31.  O2 saturations were noted to be as low as 81% with ambulation and chest x-ray and noted infiltrates in the right lower lobe suspicious for aspiration or infection.  Influenza A and B, COVID-19 and RSV screening are negative.  She was given empiric antibiotics IV Rocephin and azithromycin in given Lasix 20 mg IV as well.  Assessment and Plan:  Acute Respiratory failure with hypoxia, improved Right lower lobe pneumonia -Acute.  Patient presents with acute onset of shortness of breath this morning.   -O2 saturations  noted to drop as low as 81% with ambulation.   -Chest x-ray noted infiltration of the right lower lung suspicious for aspiration or infection.  -BNP was elevated at 490.2 but I'mproved from 1/22   -Influenza, COVID-19, and RSV screening were negative.  Patient had been started on empiric antibiotics of Rocephin and azithromycin.  -Question possibility of pneumonia  SpO2: 97 % -Admit to a telemetry bed -Nasal cannula oxygen to maintain O2 saturation greater than 92% -Aspiration precautions with elevation head of bed -Incentive spirometry and flutter valve -Check Procalcitonin and was <0.10 -Continue empiric antibiotics of Rocephin and azithromycin; And add IV Flagyl -Flutter Valve, Incentive Spirometry, Guaifenesin 1200 mg po BID -Albuterol nebs as needed for shortness of breath/wheezing pains to Xopenex/Atrovent scheduled -Obtain SLP evaluation and MBS done and the recommended regular diet with thin liquids but recommending outpatient follow-up with GI for esophageal dysmotility -X-ray done prior to D/C showed "Chronic interstitial coarsening with subtle patchy airspace disease at the right base." -Home O2 screen done prior to discharge and she did not desaturate   Elevated troponin -High-sensitivity troponins essentially flat 21->23.  EKG did not note significant ischemic changes.  Patient denied any reports of chest pain.  Low suspicion for any embolism at this time. -Add on D-dimer elevated at 1.53  Elevated D-Dimer -D-dimer was 1.53 -Check LE Venous Duplex that showed no lower extremity DVT -Consider CTA PE Protocol but PE unlikely given her Anticoagulation    Diastolic congestive heart failure -Question acute on chronic.  BNP elevated at 490.2.   -Last echocardiogram noted EF to be 55 to 60% with grade 2 diastolic dysfunction when last checked 06/2023..   -She had been told to hold Lasix following last hospitalization.   -  She had been given Lasix 20 mg IV x 1 dose in the  ED. -Strict I&Os and daily weights No intake/output data recorded. No intake/output data recorded. -Reassess and determine need of continued IV diuresis and she does not as she is stable   History of diverticulitis -Patient had just recently been hospitalized for diverticulitis earlier this month.   -Subsequently seen in the ED on 1/20 with reports of continued abdominal pain left lower quadrant.   -Started on Augmentin yesterday.  - Patient denies continued abdominal pain at this time.  Records note prior hospitalization back in September with sigmoid diverticulitis. -Held Augmentin and added Flagyl Back; will continue Augmentin at discharge to treat her PNA   Macrocytic Anemia -Chronic. Hgb/Hct Trend: Recent Labs  Lab 12/06/23 0439 12/18/23 2127 12/20/23 1903 12/21/23 0316 12/21/23 0606 12/22/23 0506 12/23/23 0355  HGB 11.3* 12.0 11.4* 10.8* 11.7* 11.3* 10.7*  HCT 35.3* 36.2 34.6* 33.0* 34.9* 34.6* 32.6*  MCV 104.7* 98.4 102.4* 100.6*  --  100.3* 101.2*  -Check Anemia Panel and showed an iron level of 29, UIBC of 297, TIBC 326, saturation ratios of 9%, ferritin of 15, folate level 19.0 and vitamin B12 570 -Continue to [place] for S/Sx of Bleeding; no overt bleeding noted -Repeat CBC within 1 week   Paroxysmal atrial fibrillation on chronic anticoagulation -Patient appears to be in a sinus rhythm at this time. -Continue beta-blocker and Eliquis -Continue to Monitor pm Telemetry  Depression and Anxiety -Continue with sertraline 50 mg p.o. daily   Essential hypertension -Blood pressures currently maintained. -Continue home regimen and continue to Monitor BP per Protocol -Last BP reading was 136/73   Thrombocytosis -Platelet Count Trend: Recent Labs  Lab 12/05/23 0532 12/06/23 0439 12/18/23 2127 12/20/23 1903 12/21/23 0316 12/22/23 0506 12/23/23 0355  PLT 321 393 465* 461* 416* 424* 406*  -In the setting of Above -Continue to Monitor and Trend and repeat CBC  within 1 week   Controlled diabetes mellitus type 2, without long-term use of insulin -On admission glucose noted to be 150. -Hypoglycemic protocol -Hold metformin -Continue Januvia -CBG Trend: Recent Labs  Lab 12/04/23 2314 12/05/23 0821 12/05/23 1206 12/05/23 1737 12/05/23 2139 12/06/23 0754 12/06/23 1256  GLUCAP 159* 108* 202* 160* 185* 136* 231*  -Continue to Monitor Trend Carefully    Dementia -C/w Delirium precautions -Continue Donepizil 5 mg po qHS   Hyperlipidemia -Continue Rosuvastatin 10 mg po qHS   Hyponatremia -Possibly secondary to a hypervolemic hyponatremia. -Na+ Trend: Recent Labs  Lab 12/05/23 0532 12/06/23 0439 12/18/23 2127 12/20/23 1903 12/21/23 0316 12/22/23 0506 12/23/23 0355  NA 129* 134* 132* 132* 131* 140 139  -Continue to Monitor and Trend and Repeat CMP in the AM   Hypokalemia -Patient's K+ Level Trend: Recent Labs  Lab 12/05/23 0532 12/06/23 0439 12/18/23 2127 12/20/23 1903 12/21/23 0316 12/22/23 0506 12/23/23 0355  K 3.0* 3.3* 3.2* 4.2 3.5 3.3* 4.5  -Replete with po KCL 40 mEQ BID x2 yesterday -Continue to Monitor and Replete as Necessary -Repeat CMP within 1 week  Hypomagnesemia -Patient's Mag Level Trend: Recent Labs  Lab 12/22/23 0506 12/23/23 0355  MG 1.4* 3.0*  -Replete with IV Mag Sulfate 4 Grams yesterday -Continue to Monitor and Replete as Necessary -Repeat Mag within 1 week

## 2023-12-22 NOTE — Progress Notes (Signed)
Pt refusing breathing tx stating she does not take them at home and is not SOB at this time. RT tried to educate that PT was here her resp failure/ low O2 saturations. Pt still declined txs and has requested the ordering provider explain to her why tx where ordered.

## 2023-12-22 NOTE — Progress Notes (Signed)
PROGRESS NOTE    Heather Olsen  NFA:[id number] DOB: Dec 31, 1930 DOA: 12/21/2023 PCP: Merri Brunette, MD   Brief Narrative:  Patient is a 89 year old Caucasian female with a past medical history significant for benign to aortic stenosis, mitral regurgitation, CAD, depression, hiatal hernia, hyperlipidemia, hyperglycemia, hypertension, idiopathic scoliosis, osteoporosis, pernicious anemia, Raynaud's disease, spinal stenosis of the lumbar spine as well as other comorbidities who presented with shortness of breath and chills.  Shortness of breath started yesterday morning but did not wake her out of her sleep.  She was recently hospitalized from 12/14/2023 until 12/06/2023 with acute diverticulitis of the distal descending colon.  She was treated with Rocephin and Flagyl with improvement in her symptoms and was discharged on Cipro Flagyl to complete the course and during the last hospitalization she was recommended to hold her diuretic.  She is seen back in the emergency department 12/18/2023 with complaints of left lower quadrant pain and CT scan of the abdomen pelvis noted decreased inflammatory changes in the descending and sigmoid colon with mild residual wall thickening in the sigmoid colon without perforation.  Subsequently she is seen again yesterday afternoon due to persistent left lower quadrant abdominal pain and was given Augmentin yesterday afternoon.  Patient reports those abdominal symptoms have resolved but given her shortness of breath she presented the ED and she is currently afebrile with respirations 16-31.  O2 saturations were noted to be as low as 81% with ambulation and chest x-ray and noted infiltrates in the right lower lobe suspicious for aspiration or infection.  Influenza A and B, COVID-19 and RSV screening are negative.  She was given empiric antibiotics IV Rocephin and azithromycin in given Lasix 20 mg IV as well.  Assessment and Plan:  Acute Respiratory failure with hypoxia Suspected  Pneumonia -Acute.  Patient presents with acute onset of shortness of breath this morning.   -O2 saturations noted to drop as low as 81% with ambulation.   -Chest x-ray noted infiltration of the right lower lung suspicious for aspiration or infection.  -BNP was elevated at 490.2 but I'mproved from 1/22   -Influenza, COVID-19, and RSV screening were negative.  Patient had been started on empiric antibiotics of Rocephin and azithromycin.  -Question possibility of pneumonia  SpO2: 99 % -Admit to a telemetry bed -Nasal cannula oxygen to maintain O2 saturation greater than 92% -Aspiration precautions with elevation head of bed -Incentive spirometry and flutter valve -Check Procalcitonin and was <0.10 -Continue empiric antibiotics of Rocephin and azithromycin; And add IV Flagyl -Flutter Valve, Incentive Spirometry, Guaifenesin 1200 mg po BID -Albuterol nebs as needed for shortness of breath/wheezing pains to Xopenex/Atrovent scheduled -Chest x-ray in a.m. and consider CTA of the chest PE protocol given elevated D-dimer but unlikely has a PE   Elevated troponin -High-sensitivity troponins essentially flat 21->23.  EKG did not note significant ischemic changes.  Patient denied any reports of chest pain.  Low suspicion for any embolism at this time. -Add on D-dimer elevated  Elevated D-Dimer -Check LE Venous Duplex -Consider CTA PE Protocol but PE unlikely given her Anticoagulation    Diastolic congestive heart failure -Question acute on chronic.  BNP elevated at 490.2.   -Last echocardiogram noted EF to be 55 to 60% with grade 2 diastolic dysfunction when last checked 06/2023..   -She had been told to hold Lasix following last hospitalization.   -She had been given Lasix 20 mg IV x 1 dose in the ED. -Strict I&Os and daily weights I/O last 3 completed  shifts: In: 533.3 [IV Piggyback:533.3] Out: -  No intake/output data recorded. -Reassess and determine need of continued IV diuresis    History of diverticulitis -Patient had just recently been hospitalized for diverticulitis earlier this month.   -Subsequently seen in the ED on 1/20 with reports of continued abdominal pain left lower quadrant.   -Started on Augmentin yesterday.  - Patient denies continued abdominal pain at this time.  Records note prior hospitalization back in September with sigmoid diverticulitis. -Held Augmentin and added Flagyl Back    Macrocytic Anemia -Chronic. Hgb/Hct Trend: Recent Labs  Lab 12/05/23 0532 12/06/23 0439 12/18/23 2127 12/20/23 1903 12/21/23 0316 12/21/23 0606 12/22/23 0506  HGB 9.9* 11.3* 12.0 11.4* 10.8* 11.7* 11.3*  HCT 30.8* 35.3* 36.2 34.6* 33.0* 34.9* 34.6*  MCV 103.7* 104.7* 98.4 102.4* 100.6*  --  100.3*  -Check Anemia Panel in the AM -Continue to [place] for S/Sx of Bleeding; no overt bleeding noted -Repeat CBC in the AM   Paroxysmal atrial fibrillation on chronic anticoagulation -Patient appears to be in a sinus rhythm at this time. -Continue beta-blocker and Eliquis -Continue to Monitor pm Telemetry  Depression and Anxiety -Continue with sertraline 50 mg p.o. daily   Essential hypertension -Blood pressures currently maintained. -Continue home regimen and continue to Monitor BP per Protocol -Last BP reading was 136/73   Thrombocytosis -Platelet Count Trend: Recent Labs  Lab 12/04/23 1100 12/05/23 0532 12/06/23 0439 12/18/23 2127 12/20/23 1903 12/21/23 0316 12/22/23 0506  PLT 304 321 393 465* 461* 416* 424*  -In the setting of Above -Continue to Monitor and Trend and repeat CBC in the AM    Controlled diabetes mellitus type 2, without long-term use of insulin -On admission glucose noted to be 150. -Hypoglycemic protocol -Hold metformin -Continue Januvia -CBG Trend: Recent Labs  Lab 12/04/23 2314 12/05/23 0821 12/05/23 1206 12/05/23 1737 12/05/23 2139 12/06/23 0754 12/06/23 1256  GLUCAP 159* 108* 202* 160* 185* 136* 231*  -Continue to  Monitor Trend Carefully    Dementia -C/w Delirium precautions -Continue Donepizil 5 mg po qHS   Hyperlipidemia -Continue Rosuvastatin 10 mg po qHS   Hyponatremia -Possibly secondary to a hypervolemic hyponatremia. -Na+ Trend: Recent Labs  Lab 12/04/23 1100 12/05/23 0532 12/06/23 0439 12/18/23 2127 12/20/23 1903 12/21/23 0316 12/22/23 0506  NA 128* 129* 134* 132* 132* 131* 140  -Continue to Monitor and Trend and Repeat CMP in the AM   Hypokalemia -Patient's K+ Level Trend: Recent Labs  Lab 12/04/23 1100 12/05/23 0532 12/06/23 0439 12/18/23 2127 12/20/23 1903 12/21/23 0316 12/22/23 0506  K 3.6 3.0* 3.3* 3.2* 4.2 3.5 3.3*  -Replete with po KCL 40 mEQ BID x2 -Continue to Monitor and Replete as Necessary -Repeat CMP in the AM   Hypomagnesemia -Patient's Mag Level Trend: Recent Labs  Lab 12/22/23 0506  MG 1.4*  -Replete with IV Mag Sulfate 4 Grasm -Continue to Monitor and Replete as Necessary -Repeat Mag in the AM    DVT prophylaxis: apixaban (ELIQUIS) tablet 2.5 mg Start: 12/21/23 1200 SCDs Start: 12/21/23 0923 apixaban (ELIQUIS) tablet 2.5 mg    Code Status: Full Code Family Communication: Discussed with son at bedside  Disposition Plan:  Level of care: Telemetry Cardiac Status is: Inpatient Remains inpatient appropriate because: Needs further clinical improvement in respiratory status and anticipating discharge in next 24 to 48 hours   Consultants:  None  Procedures:  As delineated as above  Antimicrobials:  Anti-infectives (From admission, onward)    Start     [employer]  Frequency Ordered Stop   12/22/23 1815  metroNIDAZOLE (FLAGYL) IVPB 500 mg        500 mg 100 mL/hr over 60 Minutes Intravenous Every 12 hours 12/22/23 1725     12/22/23 0500  azithromycin (ZITHROMAX) 500 mg in sodium chloride 0.9 % 250 mL IVPB        500 mg 250 mL/hr over 60 Minutes Intravenous Every 24 hours 12/21/23 2021     12/22/23 0500  cefTRIAXone (ROCEPHIN) 1 g in  sodium chloride 0.9 % 100 mL IVPB        1 g 200 mL/hr over 30 Minutes Intravenous Every 24 hours 12/21/23 2021     12/21/23 1200  amoxicillin-clavulanate (AUGMENTIN) 500-125 MG per tablet 1 tablet  Status:  Discontinued        1 tablet Oral 2 times daily 12/21/23 1158 12/21/23 1203   12/21/23 0430  cefTRIAXone (ROCEPHIN) 2 g in sodium chloride 0.9 % 100 mL IVPB        2 g 200 mL/hr over 30 Minutes Intravenous  Once 12/21/23 0416 12/21/23 0523   12/21/23 0430  azithromycin (ZITHROMAX) 500 mg in sodium chloride 0.9 % 250 mL IVPB        500 mg 250 mL/hr over 60 Minutes Intravenous  Once 12/21/23 0416 12/21/23 1610       Subjective: Seen and examined at bedside and states that she is feeling better and not short of breath.  States that she is about 70% better since coming in.  No lightheadedness or dizziness and states her abdominal pain has improved.  No other concerns or complaints at this time.  Objective: Vitals:   12/22/23 0500 12/22/23 0844 12/22/23 1209 12/22/23 1730  BP:  (!) 153/83 (!) 122/90 136/73  Pulse:  65 74 77  Resp:  14 (!) 22 20  Temp:  97.8 F (36.6 C) 97.8 F (36.6 C) 98.3 F (36.8 C)  TempSrc:  Oral Oral Oral  SpO2:  99% 99%   Weight: 46.9 kg     Height:        Intake/Output Summary (Last 24 hours) at 12/22/2023 1917 Last data filed at 12/22/2023 9604 Gross per 24 hour  Intake 283.33 ml  Output --  Net 283.33 ml   Filed Weights   12/21/23 0219 12/21/23 2014 12/22/23 0500  Weight: 48.5 kg 47.5 kg 46.9 kg   Examination: Physical Exam:  Constitutional: Thin frail chronically ill-appearing female no acute distress Respiratory: Diminished to auscultation bilaterally with some coarse breath sounds on the right to the left and has some slight rhonchi.  But no appreciable wheezing, rales or crackles. Normal respiratory effort and patient is not tachypenic. No accessory muscle use.  Not wearing supplemental oxygen nasal cannula Cardiovascular: RRR, no murmurs /  rubs / gallops. S1 and S2 auscultated. No extremity edema.  Abdomen: Soft, non-tender, non-distended. Bowel sounds positive.  GU: Deferred. Musculoskeletal: No clubbing / cyanosis of digits/nails. No joint deformity upper and lower extremities. Skin: No rashes, lesions, ulcers on a limited skin evaluation. No induration; Warm and dry.  Neurologic: CN 2-12 grossly intact with no focal deficits. Romberg sign and cerebellar reflexes not assessed.  Psychiatric: Normal judgment and insight. Alert and oriented x 3. Normal mood and appropriate affect.   Data Reviewed: I have personally reviewed following labs and imaging studies  CBC: Recent Labs  Lab 12/18/23 2127 12/20/23 1903 12/21/23 0316 12/21/23 0606 12/22/23 0506  WBC 6.8 7.8 6.3  --  5.1  NEUTROABS  --   --  3.5  --   --   HGB 12.0 11.4* 10.8* 11.7* 11.3*  HCT 36.2 34.6* 33.0* 34.9* 34.6*  MCV 98.4 102.4* 100.6*  --  100.3*  PLT 465* 461* 416*  --  424*   Basic Metabolic Panel: Recent Labs  Lab 12/18/23 2127 12/20/23 1903 12/21/23 0316 12/22/23 0506  NA 132* 132* 131* 140  K 3.2* 4.2 3.5 3.3*  CL 94* 94* 94* 102  CO2 25 28 25 26   GLUCOSE 293* 220* 150* 146*  BUN 28* 28* 20 14  CREATININE 1.09* 1.07* 1.01* 0.98  CALCIUM 9.3 9.6 9.1 9.2  MG  --   --   --  1.4*  PHOS  --   --   --  3.9   GFR: Estimated Creatinine Clearance: 28.2 mL/min (by C-G formula based on SCr of 0.98 mg/dL). Liver Function Tests: Recent Labs  Lab 12/18/23 2127 12/20/23 1903  AST 38 39  ALT 18 17  ALKPHOS 81 92  BILITOT 0.5 0.4  PROT 7.0 7.0  ALBUMIN 3.6 3.5   Recent Labs  Lab 12/18/23 2127 12/20/23 1903  LIPASE 31 51   No results for input(s): "AMMONIA" in the last 168 hours. Coagulation Profile: No results for input(s): "INR", "PROTIME" in the last 168 hours. Cardiac Enzymes: No results for input(s): "CKTOTAL", "CKMB", "CKMBINDEX", "TROPONINI" in the last 168 hours. BNP (last 3 results) No results for input(s): "PROBNP" in the  last 8760 hours. HbA1C: No results for input(s): "HGBA1C" in the last 72 hours. CBG: No results for input(s): "GLUCAP" in the last 168 hours. Lipid Profile: No results for input(s): "CHOL", "HDL", "LDLCALC", "TRIG", "CHOLHDL", "LDLDIRECT" in the last 72 hours. Thyroid Function Tests: No results for input(s): "TSH", "T4TOTAL", "FREET4", "T3FREE", "THYROIDAB" in the last 72 hours. Anemia Panel: No results for input(s): "VITAMINB12", "FOLATE", "FERRITIN", "TIBC", "IRON", "RETICCTPCT" in the last 72 hours. Sepsis Labs: Recent Labs  Lab 12/21/23 0606  PROCALCITON <0.10   Recent Results (from the past 240 hours)  Resp panel by RT-PCR (RSV, Flu A&B, Covid) Anterior Nasal Swab     Status: None   Collection Time: 12/21/23  9:25 AM   Specimen: Anterior Nasal Swab  Result Value Ref Range Status   SARS Coronavirus 2 by RT PCR NEGATIVE NEGATIVE Final   Influenza A by PCR NEGATIVE NEGATIVE Final   Influenza B by PCR NEGATIVE NEGATIVE Final    Comment: (NOTE) The Xpert Xpress SARS-CoV-2/FLU/RSV plus assay is intended as an aid in the diagnosis of influenza from Nasopharyngeal swab specimens and should not be used as a sole basis for treatment. Nasal washings and aspirates are unacceptable for Xpert Xpress SARS-CoV-2/FLU/RSV testing.  Fact Sheet for Patients: BloggerCourse.com  Fact Sheet for Healthcare Providers: SeriousBroker.it  This test is not yet approved or cleared by the [place] FDA and has been authorized for detection and/or diagnosis of SARS-CoV-2 by FDA under an Emergency Use Authorization (EUA). This EUA will remain in effect (meaning this test can be used) for the duration of the COVID-19 declaration under Section 564(b)(1) of the Act, 21 U.S.C. section 360bbb-3(b)(1), unless the authorization is terminated or revoked.     Resp Syncytial Virus by PCR NEGATIVE NEGATIVE Final    Comment: (NOTE) Fact Sheet for  Patients: BloggerCourse.com  Fact Sheet for Healthcare Providers: SeriousBroker.it  This test is not yet approved or cleared by the [place] FDA and has been authorized for detection and/or diagnosis of SARS-CoV-2 by FDA under an Emergency Use Authorization (EUA). This  EUA will remain in effect (meaning this test can be used) for the duration of the COVID-19 declaration under Section 564(b)(1) of the Act, 21 U.S.C. section 360bbb-3(b)(1), unless the authorization is terminated or revoked.  Performed at [facility], [address], [place], [place] [address]     Radiology Studies: DG Chest Port 1 View Result Date: 12/21/2023 CLINICAL DATA:  Shortness of breath EXAM: PORTABLE CHEST 1 VIEW COMPARISON:  12/20/2023 FINDINGS: Stable cardiomegaly. Aortic atherosclerotic calcification. Infiltrates in the right lower lung. No pleural effusion or pneumothorax. IMPRESSION: Infiltrates in the right lower lung suspicious for aspiration or infection. Electronically Signed   By: Minerva Fester M.D.   On: 12/21/2023 03:04   Scheduled Meds:  apixaban  2.5 mg Oral BID   donepezil  5 mg Oral QHS   empagliflozin  10 mg Oral Daily   guaiFENesin  1,200 mg Oral BID   ipratropium  0.5 mg Nebulization Q6H   levalbuterol  0.63 mg Nebulization Q6H   mirabegron ER  50 mg Oral QHS   mirtazapine  15 mg Oral QHS   pantoprazole  40 mg Oral BID   pindolol  2.5 mg Oral BID   potassium chloride  40 mEq Oral BID   rOPINIRole  0.5 mg Oral QHS   rosuvastatin  10 mg Oral QHS   sertraline  50 mg Oral Daily   sodium chloride flush  3 mL Intravenous Q12H   Continuous Infusions:  azithromycin 500 mg (12/22/23 0527)   cefTRIAXone (ROCEPHIN)  IV 1 g (12/22/23 6045)   magnesium sulfate bolus IVPB 4 g (12/22/23 1758)   metronidazole      LOS: 0 days   Marguerita Merles, DO [facility] Available via Epic secure chat 7am-7pm After these hours, please  refer to coverage provider listed on [email or web address] 12/22/2023, 7:17 PM

## 2023-12-23 ENCOUNTER — Inpatient Hospital Stay (HOSPITAL_COMMUNITY): Payer: Medicare Other

## 2023-12-23 DIAGNOSIS — M7989 Other specified soft tissue disorders: Secondary | ICD-10-CM | POA: Diagnosis not present

## 2023-12-23 DIAGNOSIS — I10 Essential (primary) hypertension: Secondary | ICD-10-CM

## 2023-12-23 DIAGNOSIS — J9601 Acute respiratory failure with hypoxia: Secondary | ICD-10-CM | POA: Diagnosis not present

## 2023-12-23 DIAGNOSIS — R7989 Other specified abnormal findings of blood chemistry: Secondary | ICD-10-CM | POA: Diagnosis not present

## 2023-12-23 DIAGNOSIS — D539 Nutritional anemia, unspecified: Secondary | ICD-10-CM | POA: Diagnosis not present

## 2023-12-23 LAB — COMPREHENSIVE METABOLIC PANEL
ALT: 13 U/L (ref 0–44)
AST: 23 U/L (ref 15–41)
Albumin: 2.9 g/dL — ABNORMAL LOW (ref 3.5–5.0)
Alkaline Phosphatase: 68 U/L (ref 38–126)
Anion gap: 8 (ref 5–15)
BUN: 14 mg/dL (ref 8–23)
CO2: 24 mmol/L (ref 22–32)
Calcium: 9 mg/dL (ref 8.9–10.3)
Chloride: 107 mmol/L (ref 98–111)
Creatinine, Ser: 0.92 mg/dL (ref 0.44–1.00)
GFR, Estimated: 59 mL/min — ABNORMAL LOW (ref >60)
Glucose, Bld: 134 mg/dL — ABNORMAL HIGH (ref 70–99)
Potassium: 4.5 mmol/L (ref 3.5–5.1)
Sodium: 139 mmol/L (ref 135–145)
Total Bilirubin: 0.4 mg/dL (ref 0.0–1.2)
Total Protein: 5.5 g/dL — ABNORMAL LOW (ref 6.5–8.1)

## 2023-12-23 LAB — CBC WITH DIFFERENTIAL/PLATELET
Abs Immature Granulocytes: 0.03 10*3/uL (ref 0.00–0.07)
Basophils Absolute: 0.1 10*3/uL (ref 0.0–0.1)
Basophils Relative: 1 %
Eosinophils Absolute: 0.2 10*3/uL (ref 0.0–0.5)
Eosinophils Relative: 3 %
HCT: 32.6 % — ABNORMAL LOW (ref 36.0–46.0)
Hemoglobin: 10.7 g/dL — ABNORMAL LOW (ref 12.0–15.0)
Immature Granulocytes: 1 %
Lymphocytes Relative: 25 %
Lymphs Abs: 1.6 10*3/uL (ref 0.7–4.0)
MCH: 33.2 pg (ref 26.0–34.0)
MCHC: 32.8 g/dL (ref 30.0–36.0)
MCV: 101.2 fL — ABNORMAL HIGH (ref 80.0–100.0)
Monocytes Absolute: 0.7 10*3/uL (ref 0.1–1.0)
Monocytes Relative: 11 %
Neutro Abs: 4 10*3/uL (ref 1.7–7.7)
Neutrophils Relative %: 59 %
Platelets: 406 10*3/uL — ABNORMAL HIGH (ref 150–400)
RBC: 3.22 MIL/uL — ABNORMAL LOW (ref 3.87–5.11)
RDW: 14.4 % (ref 11.5–15.5)
WBC: 6.6 10*3/uL (ref 4.0–10.5)
nRBC: 0 % (ref 0.0–0.2)

## 2023-12-23 LAB — MAGNESIUM: Magnesium: 3 mg/dL — ABNORMAL HIGH (ref 1.7–2.4)

## 2023-12-23 LAB — RETICULOCYTES
Immature Retic Fract: 14.8 % (ref 2.3–15.9)
RBC.: 3.21 MIL/uL — ABNORMAL LOW (ref 3.87–5.11)
Retic Count, Absolute: 63.2 10*3/uL (ref 19.0–186.0)
Retic Ct Pct: 2 % (ref 0.4–3.1)

## 2023-12-23 LAB — IRON AND TIBC
Iron: 29 ug/dL (ref 28–170)
Saturation Ratios: 9 % — ABNORMAL LOW (ref 10.4–31.8)
TIBC: 326 ug/dL (ref 250–450)
UIBC: 297 ug/dL

## 2023-12-23 LAB — FOLATE: Folate: 19 ng/mL (ref >5.9)

## 2023-12-23 LAB — FERRITIN: Ferritin: 15 ng/mL (ref 11–307)

## 2023-12-23 LAB — VITAMIN B12: Vitamin B-12: 570 pg/mL (ref 180–914)

## 2023-12-23 LAB — PHOSPHORUS: Phosphorus: 3.6 mg/dL (ref 2.5–4.6)

## 2023-12-23 MED ORDER — AMOXICILLIN-POT CLAVULANATE 875-125 MG PO TABS: 1.0000 | ORAL_TABLET | Freq: Two times a day (BID) | ORAL | Status: DC

## 2023-12-23 MED ORDER — AMOXICILLIN-POT CLAVULANATE 875-125 MG PO TABS: 1.0000 | ORAL_TABLET | Freq: Two times a day (BID) | ORAL | 0 refills | Status: AC

## 2023-12-23 MED ORDER — GUAIFENESIN ER 600 MG PO TB12: 1200.0000 mg | ORAL_TABLET | Freq: Two times a day (BID) | ORAL | 0 refills | Status: AC

## 2023-12-23 NOTE — Progress Notes (Signed)
   12/23/23 0932  PT Visit Information  Last PT Received On 12/23/23  Reason Eval/Treat Not Completed PT screened, no needs identified, will sign off  Progressive Mobility  Activity Ambulated independently in room   No needs identified as pt Mod I with device and can walk without device as well.  Has all needed equipment. Thanks.  Jameelah Watts M,PT Acute Rehab Services [phone]

## 2023-12-23 NOTE — Evaluation (Signed)
Clinical/Bedside Swallow Evaluation Patient Details  Name: Heather Olsen MRN: [id number] Date of Birth: 07/08/1931  Today's Date: 12/23/2023 Time: SLP Start Time (ACUTE ONLY): 1008 SLP Stop Time (ACUTE ONLY): 1028 SLP Time Calculation (min) (ACUTE ONLY): 20 min  Past Medical History:  Past Medical History:  Diagnosis Date   Aortic stenosis 04/12/2016   none noted on echo done 08/13/20   Coronary artery disease    Depression    Diverticulosis    Hearing loss 04/12/2016   Hiatal hernia 04/12/2016   HLD (hyperlipidemia) 04/12/2016   Hyperglycemia 09/14/2016   Hypertension    Idiopathic scoliosis 04/12/2016   Major depression 04/12/2016   Mitral regurgitation    Osteoporosis, senile 04/12/2016   Pernicious anemia    Raynaud's disease 04/12/2016   Scoliosis    Spinal stenosis of lumbar region 04/12/2016   Past Surgical History:  Past Surgical History:  Procedure Laterality Date   APPENDECTOMY     BREAST SURGERY     broken wrist Right 2007   CARPAL TUNNEL RELEASE Left 04/04/2017   Procedure: LEFT CARPAL TUNNEL RELEASE;  Surgeon: Cindee Salt, MD;  Location: [facility];  Service: Orthopedics;  Laterality: Left;  REG/FAB   CARPAL TUNNEL RELEASE Right 12/01/2020   Procedure: CARPAL TUNNEL RELEASE;  Surgeon: Cindee Salt, MD;  Location: [facility];  Service: Orthopedics;  Laterality: Right;  IV REGIONAL FOREARM BLOCK   CATARACT EXTRACTION W/ INTRAOCULAR LENS  IMPLANT, BILATERAL Bilateral 2017   COLONOSCOPY  2016   FOOT SURGERY Right    PERIPHERAL VASCULAR INTERVENTION  11/11/2019   Procedure: PERIPHERAL VASCULAR INTERVENTION;  Surgeon: Maeola Harman, MD;  Location: [facility] INVASIVE CV LAB;  Service: Cardiovascular;;   TONSILLECTOMY     VISCERAL ANGIOGRAPHY N/A 11/11/2019   Procedure: VISCERAL ANGIOGRAPHY;  Surgeon: Maeola Harman, MD;  Location: [facility] INVASIVE CV LAB;  Service: Cardiovascular;  Laterality: N/A;   HPI:  Pt is a 89 yo female who  presents to [facility] on 12/21/23 with SOB and chills, being worked up for acute respiratory failure with hypoxia. PMH: Aortic stenosis, CAD, depression, diverticulosis, hearing loss, hiatal hernia, HLD, hyperglycemia, HTN, idiopathic scoliosis, mitral regurgitation, osteoporosis, pernicious anemia, Raynaud's disease, scoliosis, spinal stenosis of lumbar Pt's CXR on 1/23 indicate possible aspiration PNA. Pt reports intermittent s/sx of aspirations with mainly liquids within the context of a meal. Pt had a barium esophagus study in 8/23, which indicated esophageal dysmotility and retention at the distal esophagus with the barium pill.    Assessment / Plan / Recommendation  Clinical Impression  Pt seen for skilled ST evaluation for swallowing. The pt is currently on a reg/thin diet and was assessed with thin liquid, puree, and solids. The pt reports feeling "choked" occasionally with meals, approx. once a month and mainly with thin liquids. Pt also had a barium esophagus study in August of 2023 which indicated esophageal dysmotility and Persistent area of incomplete distension in the distal esophagus. The pt's OME was [facility], delayed swallow initiation for cued dry swallow. The pt consumed all consistencies with mild s/s of aspiration (see below for more detail). Suspected mild s/sx due to GI concerns as opposed to chronic dysphagia, however given recent concerns for aspiration on latest CXR and pt reports of choking episodes during a meal, pt is appropriate for a MBS to determine if aspiration is occurring. Diet recs to remain reg/thin liquid until MBS can be completed.      Aspiration Risk  Mild aspiration risk  Diet Recommendation Regular;Thin liquid    Liquid Administration via: Cup;Straw Medication Administration: Whole meds with liquid Supervision: Patient able to self feed Compensations: Slow rate;Small sips/bites Postural Changes: Seated upright at 90 degrees;Remain upright for at least 30 minutes  after po intake    Other  Recommendations Recommended Consults: Consider GI evaluation (Following MBS) Oral Care Recommendations: Oral care BID    Recommendations for follow up therapy are one component of a multi-disciplinary discharge planning process, led by the attending physician.  Recommendations may be updated based on patient status, additional functional criteria and insurance authorization.     Assistance Recommended at Discharge    Functional Status Assessment Patient has had a recent decline in their functional status and demonstrates the ability to make significant improvements in function in a reasonable and predictable amount of time.  Frequency and Duration min 3x week  1 week       Prognosis Prognosis for improved oropharyngeal function: Good      Swallow Study   General Date of Onset: 12/21/23 HPI: Pt is a 89 yo female who presents to [facility] on 12/21/23 with SOB and chills, being worked up for acute respiratory failure with hypoxia. PMH: Aortic stenosis, CAD, depression, diverticulosis, hearing loss, hiatal hernia, HLD, hyperglycemia, HTN, idiopathic scoliosis, mitral regurgitation, osteoporosis, pernicious anemia, Raynaud's disease, scoliosis, spinal stenosis of lumbar Pt's CXR on 1/23 indicate possible aspiration PNA. Pt reports intermittent s/sx of aspirations with mainly liquids within the context of a meal. Pt had a barium esophagus study in 8/23, which indicated esophageal dysmotility and retention at the distal esophagus with the barium pill. Type of Study: Bedside Swallow Evaluation Diet Prior to this Study: Regular;Thin liquids (Level 0) Temperature Spikes Noted: No Respiratory Status: Room air History of Recent Intubation: No Behavior/Cognition: Alert;Cooperative;Pleasant mood Oral Cavity Assessment: Within Functional Limits Oral Care Completed by SLP: No Oral Cavity - Dentition: Adequate natural dentition Vision: Functional for self-feeding Self-Feeding  Abilities: Able to feed self Patient Positioning: Upright in bed Baseline Vocal Quality: Normal Volitional Cough: Strong Volitional Swallow: Able to elicit (Did require delayed time to initiate the swallow)    Oral/Motor/Sensory Function Overall Oral Motor/Sensory Function: Within functional limits   Ice Chips Ice chips: Not tested   Thin Liquid Thin Liquid: Impaired Presentation: Cup Pharyngeal  Phase Impairments: Wet Vocal Quality    Nectar Thick Nectar Thick Liquid: Not tested   Honey Thick Honey Thick Liquid: Not tested   Puree Puree: Impaired Pharyngeal Phase Impairments: Cough - Delayed   Solid     Solid: Impaired Pharyngeal Phase Impairments: Throat Clearing - Immediate     Dione Housekeeper M.S. CCC-SLP

## 2023-12-23 NOTE — Progress Notes (Signed)
Bilateral lower extremity venous duplex has been completed. Preliminary results can be found in CV Proc through chart review.   12/23/23 10:06 AM Olen Cordial RVT

## 2023-12-23 NOTE — Evaluation (Signed)
Occupational Therapy Evaluation and DC Summary  Patient Details Name: Heather Olsen MRN: [id number] DOB: 1931/07/19 Today's Date: 12/23/2023   History of Present Illness Pt is a 89 yo female who presents to [facility] on 12/21/23 with SOB and chills, being worked up for acute respiratory failure with hypoxia. PMH: Aortic stenosis, CAD, depression, diverticulosis, hearing loss, hiatal hernia, HLD, hyperglycemia, HTN, idiopathic scoliosis, mitral regurgitation, osteoporosis, pernicious anemia, Raynaud's disease, scoliosis, spinal stenosis of lumbar   Clinical Impression   Pt admitted for above, she is ambulatory in hall with supervision no AD for safety and her Sp02 remains stable, pt just notes increased RR so we discussed use of rollator for longer distance ambulation out of her room in the event pt needs a seated rest break. Pt completing ADLs in room independently, still some lingering decreased activity tolerance that is likely to improve with more mobility. Pt has no further acute skilled OT needs. No post acute OT recommended.        If plan is discharge home, recommend the following:  (prn)    Functional Status Assessment  Patient has not had a recent decline in their functional status  Equipment Recommendations  None recommended by OT (Pt has rec DME)    Recommendations for Other Services       Precautions / Restrictions Precautions Precautions: Fall (low fall risk) Restrictions Weight Bearing Restrictions Per Provider Order: No      Mobility Bed Mobility Overal bed mobility: Independent                  Transfers Overall transfer level: Modified independent Equipment used: None               General transfer comment: increased time to rist      Balance Overall balance assessment: Mild deficits observed, not formally tested                                         ADL either performed or assessed with clinical judgement   ADL Overall ADL's :  Independent                                       General ADL Comments: Pt ambulating to toilet, wiping self, and at sink performing grooming routine independently. Pt ambulatory in hall with supervision for safety no AD.     Vision Baseline Vision/History: 1 Wears glasses Patient Visual Report: No change from baseline       Perception         Praxis         Pertinent Vitals/Pain Pain Assessment Pain Assessment: No/denies pain     Extremity/Trunk Assessment Upper Extremity Assessment Upper Extremity Assessment: Overall WFL for tasks assessed   Lower Extremity Assessment Lower Extremity Assessment: Overall WFL for tasks assessed       Communication Communication Communication: No apparent difficulties   Cognition Arousal: Alert Behavior During Therapy: WFL for tasks assessed/performed Overall Cognitive Status: Within Functional Limits for tasks assessed                                       General Comments  VSS per monitor Sp02 >91% on RA when ambulating, but quickly rebounded  to 96% after relaxation of pulse ox hand. Pt notes increased RR, discussed using Rollator at DC with longer distance ambulation due to increased RR for safety    Exercises     Shoulder Instructions      Home Living Family/patient expects to be discharged to:: Private residence ([facility]) Living Arrangements: Alone Available Help at Discharge: Available PRN/intermittently Type of Home: Independent living facility (lives at [facility]) Home Access: Level entry     Home Layout: One level     Bathroom Shower/Tub: Producer, television/film/video: Standard     Home Equipment: Shower seat - built in;Rollator (4 wheels);Cane - single Librarian, academic (2 wheels)   Additional Comments: Independent Living at [facility]      Prior Functioning/Environment Prior Level of Function : Independent/Modified Independent              Mobility Comments: Ambulates with SPC. ADLs Comments: She was independent with ADLs, performing light meal prep, and driving.        OT Problem List: Decreased activity tolerance      OT Treatment/Interventions:      OT Goals(Current goals can be found in the care plan section) Acute Rehab OT Goals Patient Stated Goal: To go home OT Goal Formulation: With patient Time For Goal Achievement: 01/06/24 Potential to Achieve Goals: Good  OT Frequency:      Co-evaluation              AM-PAC OT "6 Clicks" Daily Activity     Outcome Measure Help from another person eating meals?: None Help from another person taking care of personal grooming?: None Help from another person toileting, which includes using toliet, bedpan, or urinal?: None Help from another person bathing (including washing, rinsing, drying)?: None Help from another person to put on and taking off regular upper body clothing?: None Help from another person to put on and taking off regular lower body clothing?: None 6 Click Score: 24   End of Session Nurse Communication: Mobility status  Activity Tolerance: Patient tolerated treatment well Patient left: in bed;with call bell/phone within reach  OT Visit Diagnosis: Other (comment) (SOB)                Time: 4098-1191 OT Time Calculation (min): 29 min Charges:  OT General Charges $OT Visit: 1 Visit OT Evaluation $OT Eval Low Complexity: 1 Low OT Treatments $Self Care/Home Management : 8-22 mins  12/23/2023  AB, OTR/L  Acute Rehabilitation Services  Office: [phone]   Tristan Schroeder 12/23/2023, 11:11 AM

## 2023-12-23 NOTE — Progress Notes (Signed)
Mobility Specialist Progress Note:    12/23/23 1236  Mobility  Activity Ambulated with assistance in hallway  Level of Assistance Contact guard assist, steadying assist  Assistive Device None  Distance Ambulated (ft) 200 ft  Activity Response Tolerated well  Mobility Referral Yes  Mobility visit 1 Mobility  Mobility Specialist Start Time (ACUTE ONLY) 1150  Mobility Specialist Stop Time (ACUTE ONLY) 1200  Mobility Specialist Time Calculation (min) (ACUTE ONLY) 10 min   Received pt in bed having no complaints and agreeable to mobility. Pt was asymptomatic throughout ambulation and returned to room w/o fault. Left in BR instructed to use call bell when finished. NT aware.   Thompson Grayer Mobility Specialist  Please contact vis Secure Chat or  Rehab Office [phone]

## 2023-12-23 NOTE — Plan of Care (Signed)
  Problem: Health Behavior/Discharge Planning: Goal: Ability to manage health-related needs will improve Outcome: Adequate for Discharge   Problem: Clinical Measurements: Goal: Ability to maintain clinical measurements within normal limits will improve Outcome: Adequate for Discharge Goal: Will remain free from infection Outcome: Adequate for Discharge Goal: Diagnostic test results will improve Outcome: Adequate for Discharge Goal: Respiratory complications will improve Outcome: Adequate for Discharge Goal: Cardiovascular complication will be avoided Outcome: Adequate for Discharge   Problem: Activity: Goal: Risk for activity intolerance will decrease Outcome: Adequate for Discharge   Problem: Nutrition: Goal: Adequate nutrition will be maintained Outcome: Adequate for Discharge   Problem: Coping: Goal: Level of anxiety will decrease Outcome: Adequate for Discharge   Problem: Elimination: Goal: Will not experience complications related to bowel motility Outcome: Adequate for Discharge Goal: Will not experience complications related to urinary retention Outcome: Adequate for Discharge   Problem: Pain Managment: Goal: General experience of comfort will improve and/or be controlled Outcome: Adequate for Discharge   Problem: Safety: Goal: Ability to remain free from injury will improve Outcome: Adequate for Discharge   Problem: Skin Integrity: Goal: Risk for impaired skin integrity will decrease Outcome: Adequate for Discharge

## 2023-12-23 NOTE — Discharge Summary (Signed)
Physician Discharge Summary   Patient: Rane Oleson MRN: [id number] DOB: 03-Jul-1931  Admit date:     12/21/2023  Discharge date: 12/23/23  Discharge Physician: Marguerita Merles, DO   PCP: Merri Brunette, MD   Recommendations at discharge:  {Tip this will not be part of the note when signed- Example include specific recommendations for outpatient follow-up, pending tests to follow-up on. (Optional):26781}  ***  Discharge Diagnoses: Principal Problem:   Acute respiratory failure with hypoxia (HCC) Active Problems:   Healthcare-associated pneumonia   Elevated troponin   Acute on chronic diastolic CHF (congestive heart failure) (HCC)   History of diverticulitis   Macrocytic anemia   Paroxysmal atrial fibrillation (HCC)   Long term (current) use of anticoagulants - on Eliquis for PAF   Essential hypertension   Thrombocytosis   Type 2 diabetes mellitus (HCC)   Dementia without behavioral disturbance (HCC)  Resolved Problems:   * No resolved hospital problems. *  Hospital Course: Patient is a 89 year old Caucasian female with a past medical history significant for benign to aortic stenosis, mitral regurgitation, CAD, depression, hiatal hernia, hyperlipidemia, hyperglycemia, hypertension, idiopathic scoliosis, osteoporosis, pernicious anemia, Raynaud's disease, spinal stenosis of the lumbar spine as well as other comorbidities who presented with shortness of breath and chills.  Shortness of breath started yesterday morning but did not wake her out of her sleep.  She was recently hospitalized from 12/14/2023 until 12/06/2023 with acute diverticulitis of the distal descending colon.  She was treated with Rocephin and Flagyl with improvement in her symptoms and was discharged on Cipro Flagyl to complete the course and during the last hospitalization she was recommended to hold her diuretic.  She is seen back in the emergency department 12/18/2023 with complaints of left lower quadrant pain and CT scan of  the abdomen pelvis noted decreased inflammatory changes in the descending and sigmoid colon with mild residual wall thickening in the sigmoid colon without perforation.  Subsequently she is seen again yesterday afternoon due to persistent left lower quadrant abdominal pain and was given Augmentin yesterday afternoon.  Patient reports those abdominal symptoms have resolved but given her shortness of breath she presented the ED and she is currently afebrile with respirations 16-31.  O2 saturations were noted to be as low as 81% with ambulation and chest x-ray and noted infiltrates in the right lower lobe suspicious for aspiration or infection.  Influenza A and B, COVID-19 and RSV screening are negative.  She was given empiric antibiotics IV Rocephin and azithromycin in given Lasix 20 mg IV as well.  Assessment and Plan:  Acute Respiratory failure with hypoxia Suspected Pneumonia -Acute.  Patient presents with acute onset of shortness of breath this morning.   -O2 saturations noted to drop as low as 81% with ambulation.   -Chest x-ray noted infiltration of the right lower lung suspicious for aspiration or infection.  -BNP was elevated at 490.2 but I'mproved from 1/22   -Influenza, COVID-19, and RSV screening were negative.  Patient had been started on empiric antibiotics of Rocephin and azithromycin.  -Question possibility of pneumonia  SpO2: 99 % -Admit to a telemetry bed -Nasal cannula oxygen to maintain O2 saturation greater than 92% -Aspiration precautions with elevation head of bed -Incentive spirometry and flutter valve -Check Procalcitonin and was <0.10 -Continue empiric antibiotics of Rocephin and azithromycin; And add IV Flagyl -Flutter Valve, Incentive Spirometry, Guaifenesin 1200 mg po BID -Albuterol nebs as needed for shortness of breath/wheezing pains to Xopenex/Atrovent scheduled -Obtain SLP evaluation -Chest x-ray  in a.m. and consider CTA of the chest PE protocol given elevated  D-dimer but unlikely has a PE   Elevated troponin -High-sensitivity troponins essentially flat 21->23.  EKG did not note significant ischemic changes.  Patient denied any reports of chest pain.  Low suspicion for any embolism at this time. -Add on D-dimer elevated  Elevated D-Dimer -Check LE Venous Duplex -Consider CTA PE Protocol but PE unlikely given her Anticoagulation    Diastolic congestive heart failure -Question acute on chronic.  BNP elevated at 490.2.   -Last echocardiogram noted EF to be 55 to 60% with grade 2 diastolic dysfunction when last checked 06/2023..   -She had been told to hold Lasix following last hospitalization.   -She had been given Lasix 20 mg IV x 1 dose in the ED. -Strict I&Os and daily weights I/O last 3 completed shifts: In: 533.3 [IV Piggyback:533.3] Out: -  No intake/output data recorded. -Reassess and determine need of continued IV diuresis   History of diverticulitis -Patient had just recently been hospitalized for diverticulitis earlier this month.   -Subsequently seen in the ED on 1/20 with reports of continued abdominal pain left lower quadrant.   -Started on Augmentin yesterday.  - Patient denies continued abdominal pain at this time.  Records note prior hospitalization back in September with sigmoid diverticulitis. -Held Augmentin and added Flagyl Back    Macrocytic Anemia -Chronic. Hgb/Hct Trend: Recent Labs  Lab 12/05/23 0532 12/06/23 0439 12/18/23 2127 12/20/23 1903 12/21/23 0316 12/21/23 0606 12/22/23 0506  HGB 9.9* 11.3* 12.0 11.4* 10.8* 11.7* 11.3*  HCT 30.8* 35.3* 36.2 34.6* 33.0* 34.9* 34.6*  MCV 103.7* 104.7* 98.4 102.4* 100.6*  --  100.3*  -Check Anemia Panel in the AM -Continue to [place] for S/Sx of Bleeding; no overt bleeding noted -Repeat CBC in the AM   Paroxysmal atrial fibrillation on chronic anticoagulation -Patient appears to be in a sinus rhythm at this time. -Continue beta-blocker and Eliquis -Continue to  Monitor pm Telemetry  Depression and Anxiety -Continue with sertraline 50 mg p.o. daily   Essential hypertension -Blood pressures currently maintained. -Continue home regimen and continue to Monitor BP per Protocol -Last BP reading was 136/73   Thrombocytosis -Platelet Count Trend: Recent Labs  Lab 12/04/23 1100 12/05/23 0532 12/06/23 0439 12/18/23 2127 12/20/23 1903 12/21/23 0316 12/22/23 0506  PLT 304 321 393 465* 461* 416* 424*  -In the setting of Above -Continue to Monitor and Trend and repeat CBC in the AM    Controlled diabetes mellitus type 2, without long-term use of insulin -On admission glucose noted to be 150. -Hypoglycemic protocol -Hold metformin -Continue Januvia -CBG Trend: Recent Labs  Lab 12/04/23 2314 12/05/23 0821 12/05/23 1206 12/05/23 1737 12/05/23 2139 12/06/23 0754 12/06/23 1256  GLUCAP 159* 108* 202* 160* 185* 136* 231*  -Continue to Monitor Trend Carefully    Dementia -C/w Delirium precautions -Continue Donepizil 5 mg po qHS   Hyperlipidemia -Continue Rosuvastatin 10 mg po qHS   Hyponatremia -Possibly secondary to a hypervolemic hyponatremia. -Na+ Trend: Recent Labs  Lab 12/04/23 1100 12/05/23 0532 12/06/23 0439 12/18/23 2127 12/20/23 1903 12/21/23 0316 12/22/23 0506  NA 128* 129* 134* 132* 132* 131* 140  -Continue to Monitor and Trend and Repeat CMP in the AM   Hypokalemia -Patient's K+ Level Trend: Recent Labs  Lab 12/04/23 1100 12/05/23 0532 12/06/23 0439 12/18/23 2127 12/20/23 1903 12/21/23 0316 12/22/23 0506  K 3.6 3.0* 3.3* 3.2* 4.2 3.5 3.3*  -Replete with po KCL 40 mEQ BID  x2 -Continue to Monitor and Replete as Necessary -Repeat CMP in the AM   Hypomagnesemia -Patient's Mag Level Trend: Recent Labs  Lab 12/22/23 0506  MG 1.4*  -Replete with IV Mag Sulfate 4 Grasm -Continue to Monitor and Replete as Necessary -Repeat Mag in the AM    Assessment and Plan: No notes have been filed under this  hospital service. Service: Hospitalist     {Tip this will not be part of the note when signed Body mass index is 16.61 kg/m. , ,  (Optional):26781}  {(NOTE) Pain control PDMP Statment (Optional):26782} Consultants: *** Procedures performed: ***  Disposition: {Plan; Disposition:26390} Diet recommendation:  Discharge Diet Orders (From admission, onward)     Start     Ordered   12/23/23 0000  Diet - low sodium heart healthy        12/23/23 1503           {Diet_Plan:26776} DISCHARGE MEDICATION: Allergies as of 12/23/2023       Reactions   Sulfasalazine Other (See Comments)   Other reaction(s): Other (See Comments), headaches, headaches, headaches   Topiramate Rash   Ace Inhibitors Cough   Codeine Nausea Only   Ibandronic Acid Nausea Only   Meloxicam    Other reaction(s): GI upset, GI Upset (intolerance), Other (See Comments) unknown   Prevacid [lansoprazole]    Chest pain   Sulfa Antibiotics Other (See Comments)   headaches   Verapamil Other (See Comments)   constipation   Zolpidem Other (See Comments)   hallucinations        Medication List     PAUSE taking these medications    furosemide 40 MG tablet Wait to take this until your doctor or other care provider tells you to start again. Commonly known as: LASIX Take 1 tablet (40 mg total) by mouth daily.   potassium chloride SA 20 MEQ tablet Wait to take this until your doctor or other care provider tells you to start again. Commonly known as: KLOR-CON M Take 1 tablet (20 mEq total) by mouth daily.       STOP taking these medications    amoxicillin-clavulanate 500-125 MG tablet Commonly known as: Augmentin Replaced by: amoxicillin-clavulanate 875-125 MG tablet       TAKE these medications    acetaminophen 650 MG CR tablet Commonly known as: TYLENOL Take 650 mg by mouth daily as needed for pain.   amoxicillin-clavulanate 875-125 MG tablet Commonly known as: AUGMENTIN Take 1 tablet by  mouth 2 (two) times daily for 5 days. Replaces: amoxicillin-clavulanate 500-125 MG tablet   dicyclomine 20 MG tablet Commonly known as: BENTYL Take 1 tablet (20 mg total) by mouth 2 (two) times daily.   donepezil 5 MG tablet Commonly known as: ARICEPT Take 5 mg by mouth at bedtime.   Eliquis 2.5 MG Tabs tablet Generic drug: apixaban Take 2.5 mg by mouth 2 (two) times daily.   guaiFENesin 600 MG 12 hr tablet Commonly known as: MUCINEX Take 2 tablets (1,200 mg total) by mouth 2 (two) times daily for 5 days.   ipratropium 0.06 % nasal spray Commonly known as: ATROVENT Use 2 sprays in each nostril up to three times daily before meals for sinus drainage. What changed:  how much to take how to take this when to take this reasons to take this additional instructions   Jardiance 10 MG Tabs tablet Generic drug: empagliflozin Take 1 tablet (10 mg total) by mouth daily.   Linzess 145 MCG Caps capsule Generic drug: linaclotide  Take 145 mcg by mouth daily as needed (constipation).   metFORMIN 500 MG 24 hr tablet Commonly known as: GLUCOPHAGE-XR Take 1,000 mg by mouth at bedtime.   mirabegron ER 50 MG Tb24 tablet Commonly known as: MYRBETRIQ Take 50 mg by mouth at bedtime.   mirtazapine 15 MG tablet Commonly known as: REMERON Take 15 mg by mouth at bedtime.   MULTIPLE VITAMINS-MINERALS PO Take 1 tablet by mouth daily.   nitroGLYCERIN 0.4 MG SL tablet Commonly known as: NITROSTAT Place 1 tablet (0.4 mg total) under the tongue every 5 (five) minutes as needed for chest pain. Place one tablet under the tongue every 5 minutes as needed for chest pain. No more than 3   pantoprazole 40 MG tablet Commonly known as: PROTONIX Take 40 mg by mouth 2 (two) times daily.   pindolol 5 MG tablet Commonly known as: VISKEN Take 1/2 (one-half) tablet by mouth twice daily What changed: See the new instructions.   PROBIOTIC DAILY PO Take 1 capsule by mouth at bedtime.   Prolia 60  MG/ML Sosy injection Generic drug: denosumab Inject 60 mg into the skin every 6 (six) months.   rOPINIRole 0.5 MG tablet Commonly known as: REQUIP Take 0.5 mg by mouth at bedtime.   rosuvastatin 10 MG tablet Commonly known as: CRESTOR TAKE 1 TABLET BY MOUTH AT BEDTIME   sertraline 50 MG tablet Commonly known as: ZOLOFT Take 50 mg by mouth daily.   traMADol 50 MG tablet Commonly known as: ULTRAM TAKE 1 TABLET BY MOUTH EVERY 4 HOURS What changed: See the new instructions.   VITAMIN D PO Take 1 capsule by mouth at bedtime.        Follow-up Information     Merri Brunette, MD.   Specialty: Internal Medicine Contact information: [address] [place] [address] [place] [address] [phone]                Discharge Exam: Ceasar Mons Weights   12/21/23 2014 12/22/23 0500 12/23/23 0500  Weight: 47.5 kg 46.9 kg 46.7 kg   ***  Condition at discharge: {DC Condition:26389}  The results of significant diagnostics from this hospitalization (including imaging, microbiology, ancillary and laboratory) are listed below for reference.   Imaging Studies: DG CHEST PORT 1 VIEW Result Date: 12/23/2023 CLINICAL DATA:  Shortness of breath. EXAM: PORTABLE CHEST 1 VIEW COMPARISON:  12/21/2023 FINDINGS: The cardio pericardial silhouette is enlarged. Interstitial markings are diffusely coarsened with chronic features. Subtle patchy airspace disease at the right base is similar to prior. No substantial pleural effusion thoracolumbar scoliosis again noted. Telemetry leads overlie the chest. IMPRESSION: Chronic interstitial coarsening with subtle patchy airspace disease at the right base. Electronically Signed   By: Kennith Center M.D.   On: 12/23/2023 11:23   VAS [place] LOWER EXTREMITY VENOUS (DVT) Result Date: 12/23/2023  Lower Venous DVT Study Patient Name:  NYIESHA Stacey  Date of Exam:   12/23/2023 Medical Rec #: [id number]    Accession #:    [id number] Date of Birth: February 23, 1931    Patient Gender:  F Patient Age:   89 years Exam Location:  [facility] Procedure:      VAS [place] LOWER EXTREMITY VENOUS (DVT) Referring Phys: Marguerita Merles --------------------------------------------------------------------------------  Indications: Swelling.  Risk Factors: None identified. Comparison Study: No prior studies. Performing Technologist: Chanda Busing RVT  Examination Guidelines: A complete evaluation includes B-mode imaging, spectral Doppler, color Doppler, and power Doppler as needed of all accessible portions of each vessel. Bilateral testing is  considered an integral part of a complete examination. Limited examinations for reoccurring indications may be performed as noted. The reflux portion of the exam is performed with the patient in reverse Trendelenburg.  +---------+---------------+---------+-----------+----------+--------------+ RIGHT    CompressibilityPhasicitySpontaneityPropertiesThrombus Aging +---------+---------------+---------+-----------+----------+--------------+ CFV      Full           Yes      Yes                                 +---------+---------------+---------+-----------+----------+--------------+ SFJ      Full                                                        +---------+---------------+---------+-----------+----------+--------------+ FV Prox  Full                                                        +---------+---------------+---------+-----------+----------+--------------+ FV Mid   Full                                                        +---------+---------------+---------+-----------+----------+--------------+ FV DistalFull                                                        +---------+---------------+---------+-----------+----------+--------------+ PFV      Full                                                        +---------+---------------+---------+-----------+----------+--------------+ POP      Full           Yes       Yes                                 +---------+---------------+---------+-----------+----------+--------------+ PTV      Full                                                        +---------+---------------+---------+-----------+----------+--------------+ PERO     Full                                                        +---------+---------------+---------+-----------+----------+--------------+   +---------+---------------+---------+-----------+----------+--------------+ LEFT     CompressibilityPhasicitySpontaneityPropertiesThrombus Aging +---------+---------------+---------+-----------+----------+--------------+ CFV  Full           Yes      Yes                                 +---------+---------------+---------+-----------+----------+--------------+ SFJ      Full                                                        +---------+---------------+---------+-----------+----------+--------------+ FV Prox  Full                                                        +---------+---------------+---------+-----------+----------+--------------+ FV Mid   Full                                                        +---------+---------------+---------+-----------+----------+--------------+ FV DistalFull                                                        +---------+---------------+---------+-----------+----------+--------------+ PFV      Full                                                        +---------+---------------+---------+-----------+----------+--------------+ POP      Full           Yes      Yes                                 +---------+---------------+---------+-----------+----------+--------------+ PTV      Full                                                        +---------+---------------+---------+-----------+----------+--------------+ PERO     Full                                                         +---------+---------------+---------+-----------+----------+--------------+     Summary: RIGHT: - There is no evidence of deep vein thrombosis in the lower extremity.  - No cystic structure found in the popliteal fossa.  LEFT: - There is no evidence of deep vein thrombosis in the lower extremity.  - No cystic structure found in the popliteal fossa.  *See table(s) above for measurements and  observations.    Preliminary    DG Chest Port 1 View Result Date: 12/21/2023 CLINICAL DATA:  Shortness of breath EXAM: PORTABLE CHEST 1 VIEW COMPARISON:  12/20/2023 FINDINGS: Stable cardiomegaly. Aortic atherosclerotic calcification. Infiltrates in the right lower lung. No pleural effusion or pneumothorax. IMPRESSION: Infiltrates in the right lower lung suspicious for aspiration or infection. Electronically Signed   By: Minerva Fester M.D.   On: 12/21/2023 03:04   DG Chest Portable 1 View Result Date: 12/20/2023 CLINICAL DATA:  Short of breath, abdominal pain for several days, history of diverticulitis EXAM: PORTABLE CHEST 1 VIEW COMPARISON:  12/04/2023 FINDINGS: Single frontal view of the chest demonstrates a stable cardiac silhouette. No airspace disease, effusion, or pneumothorax. Stable right convex thoracic scoliosis. No acute bony abnormality. IMPRESSION: 1. Stable chest, no acute process. Electronically Signed   By: Sharlet Salina M.D.   On: 12/20/2023 19:24   CT ABDOMEN PELVIS W CONTRAST Result Date: 12/18/2023 CLINICAL DATA:  Left lower quadrant pain EXAM: CT ABDOMEN AND PELVIS WITH CONTRAST TECHNIQUE: Multidetector CT imaging of the abdomen and pelvis was performed using the standard protocol following bolus administration of intravenous contrast. RADIATION DOSE REDUCTION: This exam was performed according to the departmental dose-optimization program which includes automated exposure control, adjustment of the mA and/or kV according to patient size and/or use of iterative reconstruction technique. CONTRAST:   OMNIPAQUE IOHEXOL 300 MG/ML  SOLN COMPARISON:  CT 12/04/2023, 08/10/2023, 07/26/2023 FINDINGS: Lower chest: Lung bases demonstrate cardiomegaly. Patchy scarring or atelectasis at the bases. No acute consolidation Hepatobiliary: No calcified gallstones. No biliary dilatation. Hyperenhancement adjacent to the gallbladder may be reflecting altered perfusion in the liver. Pancreas: Atrophic.  No inflammation Spleen: Prior granulomatous disease.  Otherwise negative Adrenals/Urinary Tract: Adrenal glands are normal. Kidneys show no hydronephrosis. Bladder is unremarkable Stomach/Bowel: Stomach nonenlarged. No dilated small bowel. Diverticular disease of the colon. Decreased inflammatory changes at the descending and sigmoid colon with mild residual wall thickening at the sigmoid colon. No perforation or abscess Vascular/Lymphatic: Advanced aortic atherosclerosis. No aneurysm. No suspicious lymph nodes Reproductive: Uterus and bilateral adnexa are unremarkable. Other: Negative for free air or free fluid Musculoskeletal: Scoliosis and degenerative changes. No acute osseous abnormality IMPRESSION: 1. Diverticular disease of the colon. Decreased inflammatory changes at the descending and sigmoid colon with mild residual wall thickening at the sigmoid colon. No perforation or abscess. 2. Cardiomegaly. 3. Aortic atherosclerosis. Aortic Atherosclerosis (ICD10-I70.0). Electronically Signed   By: Jasmine Pang M.D.   On: 12/18/2023 23:26   CT ABDOMEN PELVIS WO CONTRAST Result Date: 12/04/2023 CLINICAL DATA:  Left lower quadrant abdominal pain EXAM: CT ABDOMEN AND PELVIS WITHOUT CONTRAST TECHNIQUE: Multidetector CT imaging of the abdomen and pelvis was performed following the standard protocol without IV contrast. RADIATION DOSE REDUCTION: This exam was performed according to the departmental dose-optimization program which includes automated exposure control, adjustment of the mA and/or kV according to patient size and/or  use of iterative reconstruction technique. COMPARISON:  08/10/2023 FINDINGS: Lower chest: Moderate cardiomegaly. Descending thoracic aortic atherosclerosis. Mild scarring or atelectasis in the left lower lobe and lingula. Hepatobiliary: Unremarkable Pancreas: Atrophic pancreas. Spleen: Punctate calcifications compatible with old granulomatous disease. Adrenals/Urinary Tract: Unremarkable Stomach/Bowel: Substantial abnormal inflammation along a 8 cm segment of the distal descending colon with abnormal wall thickening, edema in the mesocolon, and scattered diverticula. This could be from localized colitis or diverticulitis, with the latter favored. There is also sigmoid colon diverticulosis along with substantial focal wall thickening in a 4.6 cm segment  of the sigmoid colon shown on image 47 series 2, favoring a second site of diverticulitis or colitis. Tumor unlikely given that this segment was not thickened on 08/10/2023. No discrete extraluminal gas or drainable abscess. Vascular/Lymphatic: Atherosclerosis is present, including aortoiliac atherosclerotic disease. Reproductive: Unremarkable Other: No supplemental non-categorized findings. Musculoskeletal: Dextroconvex thoracic and levoconvex lumbar scoliosis with substantial rotary component. Grade 1 anterolisthesis at L4-5 and L5-S1 Prominent degenerative arthropathy of both hips with associated chondrocalcinosis. IMPRESSION: 1. Two segments of inflamed colon, one along the distal descending colon and one along the proximal sigmoid colon, favoring multifocal diverticulitis or multifocal colitis. No extraluminal gas, pneumatosis, or drainable abscess observed. 2. Moderate cardiomegaly. 3. Aortic and systemic atherosclerosis. 4. Dextroconvex thoracic and levoconvex lumbar scoliosis with substantial rotary component. Grade 1 anterolisthesis at L4-5 and L5-S1. 5. Prominent degenerative arthropathy of both hips with associated chondrocalcinosis. Aortic Atherosclerosis  (ICD10-I70.0). Electronically Signed   By: Gaylyn Rong M.D.   On: 12/04/2023 12:11   DG Chest Port 1 View Result Date: 12/04/2023 CLINICAL DATA:  Shortness of breath EXAM: PORTABLE CHEST 1 VIEW COMPARISON:  09/11/2023 FINDINGS: Moderate enlargement of the cardiopericardial silhouette. Atherosclerotic calcification of the aortic arch. Suspected scarring or atelectasis at the left lung base. Substantial dextroconvex thoracic scoliosis. No blunting of the costophrenic angles. IMPRESSION: 1. Moderate enlargement of the cardiopericardial silhouette. 2. Suspected scarring or atelectasis at the left lung base. 3. Substantial dextroconvex thoracic scoliosis. Electronically Signed   By: Gaylyn Rong M.D.   On: 12/04/2023 12:04    Microbiology: Results for orders placed or performed during the hospital encounter of 12/21/23  Resp panel by RT-PCR (RSV, Flu A&B, Covid) Anterior Nasal Swab     Status: None   Collection Time: 12/21/23  9:25 AM   Specimen: Anterior Nasal Swab  Result Value Ref Range Status   SARS Coronavirus 2 by RT PCR NEGATIVE NEGATIVE Final   Influenza A by PCR NEGATIVE NEGATIVE Final   Influenza B by PCR NEGATIVE NEGATIVE Final    Comment: (NOTE) The Xpert Xpress SARS-CoV-2/FLU/RSV plus assay is intended as an aid in the diagnosis of influenza from Nasopharyngeal swab specimens and should not be used as a sole basis for treatment. Nasal washings and aspirates are unacceptable for Xpert Xpress SARS-CoV-2/FLU/RSV testing.  Fact Sheet for Patients: BloggerCourse.com  Fact Sheet for Healthcare Providers: SeriousBroker.it  This test is not yet approved or cleared by the [place] FDA and has been authorized for detection and/or diagnosis of SARS-CoV-2 by FDA under an Emergency Use Authorization (EUA). This EUA will remain in effect (meaning this test can be used) for the duration of the COVID-19 declaration under  Section 564(b)(1) of the Act, 21 U.S.C. section 360bbb-3(b)(1), unless the authorization is terminated or revoked.     Resp Syncytial Virus by PCR NEGATIVE NEGATIVE Final    Comment: (NOTE) Fact Sheet for Patients: BloggerCourse.com  Fact Sheet for Healthcare Providers: SeriousBroker.it  This test is not yet approved or cleared by the [place] FDA and has been authorized for detection and/or diagnosis of SARS-CoV-2 by FDA under an Emergency Use Authorization (EUA). This EUA will remain in effect (meaning this test can be used) for the duration of the COVID-19 declaration under Section 564(b)(1) of the Act, 21 U.S.C. section 360bbb-3(b)(1), unless the authorization is terminated or revoked.  Performed at [facility], [address], [place], [place] [address]     Labs: CBC: Recent Labs  Lab 12/18/23 2127 12/20/23 1903 12/21/23 1914 12/21/23 7829  12/22/23 0506 12/23/23 0355  WBC 6.8 7.8 6.3  --  5.1 6.6  NEUTROABS  --   --  3.5  --   --  4.0  HGB 12.0 11.4* 10.8* 11.7* 11.3* 10.7*  HCT 36.2 34.6* 33.0* 34.9* 34.6* 32.6*  MCV 98.4 102.4* 100.6*  --  100.3* 101.2*  PLT 465* 461* 416*  --  424* 406*   Basic Metabolic Panel: Recent Labs  Lab 12/18/23 2127 12/20/23 1903 12/21/23 0316 12/22/23 0506 12/23/23 0355  NA 132* 132* 131* 140 139  K 3.2* 4.2 3.5 3.3* 4.5  CL 94* 94* 94* 102 107  CO2 25 28 25 26 24   GLUCOSE 293* 220* 150* 146* 134*  BUN 28* 28* 20 14 14   CREATININE 1.09* 1.07* 1.01* 0.98 0.92  CALCIUM 9.3 9.6 9.1 9.2 9.0  MG  --   --   --  1.4* 3.0*  PHOS  --   --   --  3.9 3.6   Liver Function Tests: Recent Labs  Lab 12/18/23 2127 12/20/23 1903 12/23/23 0355  AST 38 39 23  ALT 18 17 13   ALKPHOS 81 92 68  BILITOT 0.5 0.4 0.4  PROT 7.0 7.0 5.5*  ALBUMIN 3.6 3.5 2.9*   CBG: No results for input(s): "GLUCAP" in the last 168 hours.  Discharge time spent: {LESS THAN/GREATER  NWGN:[id number]} 30 minutes.  Signed: Merlene Laughter, DO [facility] 12/23/2023

## 2023-12-23 NOTE — Evaluation (Signed)
Modified Barium Swallow Study  Patient Details  Name: Heather Olsen MRN: [id number] Date of Birth: 1930/12/02  Today's Date: 12/23/2023  HPI/PMH: HPI: Pt is a 89 yo female who presents to [facility] on 12/21/23 with SOB and chills, being worked up for acute respiratory failure with hypoxia. PMH: Aortic stenosis, CAD, depression, diverticulosis, hearing loss, hiatal hernia, HLD, hyperglycemia, HTN, idiopathic scoliosis, mitral regurgitation, osteoporosis, pernicious anemia, Raynaud's disease, scoliosis, spinal stenosis of lumbar Pt's CXR on 1/23 indicate possible aspiration PNA. Pt reports intermittent s/sx of aspirations with mainly liquids within the context of a meal. Pt had a barium esophagus study in 8/23, which indicated esophageal dysmotility and retention at the distal esophagus with the barium pill.   Clinical Impression: Clinical Impression: Pt seen for skilled ST services this afternoon for objective swallow examination to determine if aspiration is present as opposed to s/sx of aspiration secondary to GI concerns. The pt is currently on a reg/thin liquid diet and was assessed with thin liquid, nectar thick liquids, puree, and regular solids. Per ESOPHAGUS/BARIUM SWALLOW/TABLET STUDY on 07/28/22, the pt had Marked esophageal dysmotility and Persistent area of incomplete distension in the distal esophagus, endoscopy was recommended but unable to find any endoscopy f/u. Unable to follow full MBS bolus administration due to machine error, adequate PO trials observed to make skilled recommendations. The pt's oral and pharyngeal phases of the swallow were largely [facility], with mild signs of dysphagia (see below for full note) with mild-moderate pharyngeal residue collection in the vallecula. Pt prompted to do a throat clearance then dry swallow to reduce residue, mildly successful in clearing residue from the pharyngeal space. The pt did have extensive esophageal distention with retrograde for all consistencies and  a corkscrew appearance. Barium stasis in distal esophagus (no radiologist to confirm). No aspiration or penetration observed. Pt given education on the results of the study and diet/compensatory strategy recommendations to reduce discomfort with meal, pt verbally acknowledged understanding. Given results of this assessment, diet recs are regular/thin liquid with general safe swallow precautions (small bites and sips, eat/drink slowly, sit upright with all meals and stay up for at least 30 minutes following a meal) and general GERD precautions (eat smaller meals t/o the day, adhered to reflux friendly diet). This patient may benefit from a f/u consult from GI to address esophageal concerns. No further acute ST needs at this time, SLP signing off. Please reconsult if further needs arise. (See pictures below for suspected CP bar and esophageal distention)  Factors that may increase risk of adverse event in presence of aspiration Rubye Oaks & Clearance Coots 2021): Factors that may increase risk of adverse event in presence of aspiration Rubye Oaks & Clearance Coots 2021): Respiratory or GI disease    Recommendations/Plan: Swallowing Evaluation Recommendations Swallowing Evaluation Recommendations Recommendations: PO diet PO Diet Recommendation: Regular; Thin liquids (Level 0) Liquid Administration via: Cup; Straw Medication Administration: Whole meds with liquid Supervision: Patient able to self-feed Swallowing strategies  : Small bites/sips; Slow rate Postural changes: Position pt fully upright for meals; Stay upright 30-60 min after meals Oral care recommendations: Oral care BID (2x/day) Recommended consults: Consider GI consultation; Consider esophageal assessment    Treatment Plan Treatment Plan Treatment recommendations: No treatment recommended at this time Follow-up recommendations: No SLP follow up Functional status assessment: Patient has had a recent decline in their functional status and demonstrates the  ability to make significant improvements in function in a reasonable and predictable amount of time. Interventions: Patient/family education; Compensatory techniques; Aspiration precaution training  Recommendations Recommendations for follow up therapy are one component of a multi-disciplinary discharge planning process, led by the attending physician.  Recommendations may be updated based on patient status, additional functional criteria and insurance authorization.  Assessment: Orofacial Exam: Orofacial Exam Oral Cavity: Oral Hygiene: WFL Oral Cavity - Dentition: Adequate natural dentition    Anatomy:  No data recorded  Boluses Administered: Boluses Administered Boluses Administered: Thin liquids (Level 0); Mildly thick liquids (Level 2, nectar thick); Puree; Solid     Oral Impairment Domain: Oral Impairment Domain Lip Closure: No labial escape Tongue control during bolus hold: Cohesive bolus between tongue to palatal seal Bolus preparation/mastication: Slow prolonged chewing/mashing with complete recollection Bolus transport/lingual motion: Brisk tongue motion Oral residue: Trace residue lining oral structures Location of oral residue : Tongue Initiation of pharyngeal swallow : Posterior laryngeal surface of the epiglottis     Pharyngeal Impairment Domain: Pharyngeal Impairment Domain Soft palate elevation: Trace column of contrast or air between SP and PW Laryngeal elevation: Complete superior movement of thyroid cartilage with complete approximation of arytenoids to epiglottic petiole Anterior hyoid excursion: Complete anterior movement; Partial anterior movement Epiglottic movement: Partial inversion Laryngeal vestibule closure: Complete, no air/contrast in laryngeal vestibule Pharyngeal stripping wave : Present - diminished Pharyngeal contraction (A/P view only): N/A Pharyngoesophageal segment opening: Minimal distention/minimal duration, marked obstruction of  flow Tongue base retraction: Trace column of contrast or air between tongue base and PPW; Wide column of contrast or air between tongue base and PPW Pharyngeal residue: Collection of residue within or on pharyngeal structures Location of pharyngeal residue: Valleculae; Pyriform sinuses     Esophageal Impairment Domain: Esophageal Impairment Domain Esophageal clearance upright position: Esophageal retention with retrograde flow through the PES    Pill: No data recorded  Penetration/Aspiration Scale Score: Penetration/Aspiration Scale Score 1.  Material does not enter airway: Thin liquids (Level 0); Mildly thick liquids (Level 2, nectar thick); Puree; Solid    Compensatory Strategies: Compensatory Strategies Compensatory strategies: Yes Other(comment): Effective (Cued throat clearance and dry swallow, mildly effective in clearing pharyngeal residue)       General Information: Caregiver present: No   Diet Prior to this Study: Regular; Thin liquids (Level 0)    Temperature : Normal    Respiratory Status: WFL    No data recorded   No data recorded  Behavior/Cognition: Alert; Cooperative; Pleasant mood  Self-Feeding Abilities: Able to self-feed  Baseline vocal quality/speech: Normal  Volitional Cough: Able to elicit  Volitional Swallow: Able to elicit (Did require delayed time to initiate the swallow)  Exam Limitations: No limitations   Goal Planning: Prognosis for improved oropharyngeal function: Good  No data recorded No data recorded Patient/Family Stated Goal: Pt expressed an interest in going home as soon as possible  Consulted and agree with results and recommendations: Patient; Physician   Pain: Pain Assessment Pain Assessment: No/denies pain    End of Session: Start Time:SLP Start Time (ACUTE ONLY): 1351  Stop Time: SLP Stop Time (ACUTE ONLY): 1407  Time Calculation:SLP Time Calculation (min) (ACUTE ONLY): 16 min  Charges: SLP  Evaluations $ SLP Speech Visit: 1 Visit  SLP Evaluations $BSS Swallow: 1 Procedure $MBS Swallow: 1 Procedure   SLP visit diagnosis: SLP Visit Diagnosis: Dysphagia, pharyngoesophageal phase (R13.14)    Past Medical History:  Past Medical History:  Diagnosis Date   Aortic stenosis 04/12/2016   none noted on echo done 08/13/20   Coronary artery disease    Depression    Diverticulosis    Hearing  loss 04/12/2016   Hiatal hernia 04/12/2016   HLD (hyperlipidemia) 04/12/2016   Hyperglycemia 09/14/2016   Hypertension    Idiopathic scoliosis 04/12/2016   Major depression 04/12/2016   Mitral regurgitation    Osteoporosis, senile 04/12/2016   Pernicious anemia    Raynaud's disease 04/12/2016   Scoliosis    Spinal stenosis of lumbar region 04/12/2016   Past Surgical History:  Past Surgical History:  Procedure Laterality Date   APPENDECTOMY     BREAST SURGERY     broken wrist Right 2007   CARPAL TUNNEL RELEASE Left 04/04/2017   Procedure: LEFT CARPAL TUNNEL RELEASE;  Surgeon: Cindee Salt, MD;  Location: [facility];  Service: Orthopedics;  Laterality: Left;  REG/FAB   CARPAL TUNNEL RELEASE Right 12/01/2020   Procedure: CARPAL TUNNEL RELEASE;  Surgeon: Cindee Salt, MD;  Location: [facility];  Service: Orthopedics;  Laterality: Right;  IV REGIONAL FOREARM BLOCK   CATARACT EXTRACTION W/ INTRAOCULAR LENS  IMPLANT, BILATERAL Bilateral 2017   COLONOSCOPY  2016   FOOT SURGERY Right    PERIPHERAL VASCULAR INTERVENTION  11/11/2019   Procedure: PERIPHERAL VASCULAR INTERVENTION;  Surgeon: Maeola Harman, MD;  Location: [facility] INVASIVE CV LAB;  Service: Cardiovascular;;   TONSILLECTOMY     VISCERAL ANGIOGRAPHY N/A 11/11/2019   Procedure: VISCERAL ANGIOGRAPHY;  Surgeon: Maeola Harman, MD;  Location: [facility] INVASIVE CV LAB;  Service: Cardiovascular;  Laterality: N/A;   Dione Housekeeper M.S. CCC-SLP

## 2023-12-25 ENCOUNTER — Telehealth: Payer: Self-pay

## 2023-12-25 NOTE — Transitions of Care (Post Inpatient/ED Visit) (Signed)
   12/25/2023  Name: Heather Olsen MRN: [id number] DOB: June 28, 1931  Today's TOC FU Call Status: Today's TOC FU Call Status:: Unsuccessful Call (1st Attempt) Unsuccessful Call (1st Attempt) Date: 12/25/23  Attempted to reach the patient regarding the most recent Inpatient/ED visit.  Follow Up Plan: Additional outreach attempts will be made to reach the patient to complete the Transitions of Care (Post Inpatient/ED visit) call.   Seaton Hofmann Daphine Deutscher BSN, Programmer, systems / Transitions of Care [place] [facility], [facility] Direct Dial Number:  [phone]

## 2023-12-27 ENCOUNTER — Telehealth: Payer: Self-pay

## 2023-12-27 NOTE — Transitions of Care (Post Inpatient/ED Visit) (Signed)
   12/27/2023  Name: Heather Olsen MRN: [id number] DOB: Oct 03, 1931  Today's TOC FU Call Status: Today's TOC FU Call Status:: Unsuccessful Call (2nd Attempt) Unsuccessful Call (2nd Attempt) Date: 12/27/23  Attempted to reach the patient regarding the most recent Inpatient/ED visit.  Follow Up Plan: Additional outreach attempts will be made to reach the patient to complete the Transitions of Care (Post Inpatient/ED visit) call.   Josemiguel Gries Daphine Deutscher BSN, Programmer, systems / Transitions of Care [place] [facility], [facility] Direct Dial Number:  [phone]

## 2023-12-28 ENCOUNTER — Telehealth: Payer: Self-pay

## 2023-12-28 NOTE — Transitions of Care (Post Inpatient/ED Visit) (Signed)
12/28/2023  Name: Heather Olsen MRN: [id number] DOB: November 30, 1930  Today's TOC FU Call Status: Today's TOC FU Call Status:: Successful TOC FU Call Completed TOC FU Call Complete Date: 12/28/23 Patient's Name and Date of Birth confirmed.  Transition Care Management Follow-up Telephone Call Discharge Facility: [facility]) Type of Discharge: Inpatient Admission How have you been since you were released from the hospital?: Better (Feels much better.) Any questions or concerns?: No  Items Reviewed: Did you receive and understand the discharge instructions provided?: Yes Medications obtained,verified, and reconciled?: Yes (Medications Reviewed) Any new allergies since your discharge?: No Dietary orders reviewed?: NA Do you have support at home?: Yes People in Home: alone Name of Support/Comfort Primary Source: neighbors  Medications Reviewed Today: Medications Reviewed Today     Reviewed by Earlie Server, RN (Registered Nurse) on 12/28/23 at 1446  Med List Status: <None>   Medication Order Taking? Sig Documenting Provider Last Dose Status Informant  acetaminophen (TYLENOL) 650 MG CR tablet [id number] Yes Take 650 mg by mouth daily as needed for pain. [provider] Taking Active Self, Pharmacy Records           Med Note (CRUTHIS, CHLOE C   Mon Dec 04, 2023  1:46 PM)    amoxicillin-clavulanate (AUGMENTIN) 875-125 MG tablet [id number] Yes Take 1 tablet by mouth 2 (two) times daily for 5 days. Sheikh, Omair [place], DO Taking Active   denosumab (PROLIA) 60 MG/ML SOSY injection [id number] Yes Inject 60 mg into the skin every 6 (six) months. [provider] Taking Active Self, Pharmacy Records           Med Note (CRUTHIS, CHLOE C   Mon Dec 04, 2023  2:17 PM) Pt is unsure of when this was last injected   dicyclomine (BENTYL) 20 MG tablet [id number] Yes Take 1 tablet (20 mg total) by mouth 2 (two) times daily. Smoot, Shawn Route, PA-C Taking Active Self, Pharmacy Records  donepezil  (ARICEPT) 5 MG tablet [id number] Yes Take 5 mg by mouth at bedtime. [provider] Taking Active Self, Pharmacy Records  ELIQUIS 2.5 MG TABS tablet [id number] Yes Take 2.5 mg by mouth 2 (two) times daily. [provider] Taking Active Self, Pharmacy Records  empagliflozin (JARDIANCE) 10 MG TABS tablet [id number] Yes Take 1 tablet (10 mg total) by mouth daily. Zannie Cove, MD Taking Active Self, Pharmacy Records  furosemide (LASIX) 40 MG tablet [id number] No Take 1 tablet (40 mg total) by mouth daily.  Patient not taking: Reported on 12/21/2023   Rollene Rotunda, MD Not Taking Active Self, Pharmacy Records           Med Note Sherlon Handing, [facility] D   Thu Dec 21, 2023  9:54 AM) On hold   guaiFENesin (MUCINEX) 600 MG 12 hr tablet [id number] Yes Take 2 tablets (1,200 mg total) by mouth 2 (two) times daily for 5 days. Marguerita Merles [place], DO Taking Active   ipratropium (ATROVENT) 0.06 % nasal spray [id number] Yes Use 2 sprays in each nostril up to three times daily before meals for sinus drainage.  Patient taking differently: Place 2 sprays into the nose daily as needed for rhinitis.   Kimber Relic, MD Taking Active Self, Pharmacy Records  LINZESS 145 MCG CAPS capsule [id number] Yes Take 145 mcg by mouth daily as needed (constipation). [provider] Taking Active Self, Pharmacy Records           Med Note Sherlon Handing, Nina D   Thu Dec 21, 2023  9:54 AM)    metFORMIN (GLUCOPHAGE-XR) 500 MG 24 hr tablet [id number] Yes Take 1,000 mg by mouth at bedtime. [provider] Taking Active Self, Pharmacy Records  mirabegron ER (MYRBETRIQ) 50 MG TB24 tablet [id number] Yes Take 50 mg by mouth at bedtime. [provider] Taking Active Self, Pharmacy Records           Med Note Sherlon Handing, [facility] D   Thu Dec 21, 2023  9:55 AM)    mirtazapine (REMERON) 15 MG tablet [id number] Yes Take 15 mg by mouth at bedtime. [provider] Taking Active Self, Pharmacy  Records  MULTIPLE VITAMINS-MINERALS PO [id number] Yes Take 1 tablet by mouth daily.  [provider] Taking Active Self, Pharmacy Records           Med Note Lodema Hong, MESHELL A   Tue Jun 21, 2016  2:53 PM)     nitroGLYCERIN (NITROSTAT) 0.4 MG SL tablet [id number] No Place 1 tablet (0.4 mg total) under the tongue every 5 (five) minutes as needed for chest pain. Place one tablet under the tongue every 5 minutes as needed for chest pain. No more than 3  Patient not taking: Reported on 12/28/2023   Rollene Rotunda, MD Not Taking Active Self, Pharmacy Records           Med Note Sherlon Handing, [facility] D   Thu Dec 21, 2023  9:55 AM) unknown  pantoprazole (PROTONIX) 40 MG tablet [id number] Yes Take 40 mg by mouth 2 (two) times daily. [provider] Taking Active Self, Pharmacy Records  pindolol (VISKEN) 5 MG tablet [id number] Yes Take 1/2 (one-half) tablet by mouth twice daily  Patient taking differently: Take 2.5 mg by mouth 2 (two) times daily.   Rollene Rotunda, MD Taking Active Self, Pharmacy Records  potassium chloride SA (KLOR-CON M) 20 MEQ tablet [id number] No Take 1 tablet (20 mEq total) by mouth daily.  Patient not taking: Reported on 12/21/2023   Zannie Cove, MD Not Taking Active Self, Pharmacy Records           Med Note Sherlon Handing, [place] D   Thu Dec 21, 2023  9:55 AM) On hold  Probiotic Product (PROBIOTIC DAILY PO) [id number] Yes Take 1 capsule by mouth at bedtime. [provider] Taking Active Self, Pharmacy Records  rOPINIRole (REQUIP) 0.5 MG tablet [id number] Yes Take 0.5 mg by mouth at bedtime. [provider] Taking Active Self, Pharmacy Records  rosuvastatin (CRESTOR) 10 MG tablet [id number] Yes TAKE 1 TABLET BY MOUTH AT BEDTIME  Patient taking differently: Take 10 mg by mouth at bedtime.   Maeola Harman, MD Taking Active Self, Pharmacy Records  sertraline (ZOLOFT) 50 MG tablet [id number] Yes Take 50 mg by mouth daily. [provider] Taking Active Self, Pharmacy Records  traMADol (ULTRAM) 50 MG tablet [id number] No TAKE 1 TABLET BY MOUTH EVERY 4 HOURS  Patient not taking: Reported on 12/28/2023   Kermit Balo, DO Not Taking Active Self, Pharmacy Records           Med Note (CRUTHIS, CHLOE C   Mon Dec 04, 2023  2:01 PM) Pt is unsure of last dose.  VITAMIN D PO [id number] Yes Take 1 capsule by mouth at bedtime. [provider] Taking Active Self, Pharmacy Records  Med List Note (Cruthis, Clinton Gallant, CPhT 12/04/23 1351): [facility] (independent living)             Home Care and Equipment/Supplies: Were Home Health Services Ordered?: NA Any new equipment or  medical supplies ordered?: NA  Functional Questionnaire: Do you need assistance with bathing/showering or dressing?: No Do you need assistance with meal preparation?: No Do you need assistance with eating?: No Do you have difficulty maintaining continence: No Do you need assistance with getting out of bed/getting out of a chair/moving?: No Do you have difficulty managing or taking your medications?: No  Follow up appointments reviewed: PCP Follow-up appointment confirmed?: Yes Date of PCP follow-up appointment?: 01/01/24 Follow-up Provider: [facility] Follow-up appointment confirmed?: No Do you need transportation to your follow-up appointment?: No Do you understand care options if your condition(s) worsen?: Yes-patient verbalized understanding  SDOH Interventions Today    Flowsheet Row Most Recent Value  SDOH Interventions   Food Insecurity Interventions Intervention Not Indicated  Housing Interventions Intervention Not Indicated  Transportation Interventions Intervention Not Indicated  Utilities Interventions Intervention Not Indicated      TOC Interventions Today    Flowsheet Row Most Recent Value  TOC Interventions   TOC Interventions Discussed/Reviewed TOC Interventions Discussed, TOC Interventions Reviewed, Post  discharge activity limitations per provider      Patient reports that she is doing well. Reports that she is self managing well. Denies any needs at this time. Reviewed and offered 30 day TOC program and patient declined.Provided my contact information and encouraged patient to call me if needed. Lonia Chimera, RN, BSN, CEN [employer]- Transition of Care Team.  [facility] [phone]

## 2024-01-12 DIAGNOSIS — R059 Cough, unspecified: Secondary | ICD-10-CM | POA: Diagnosis not present

## 2024-01-12 DIAGNOSIS — J189 Pneumonia, unspecified organism: Secondary | ICD-10-CM | POA: Diagnosis not present

## 2024-01-17 DIAGNOSIS — R5381 Other malaise: Secondary | ICD-10-CM | POA: Diagnosis not present

## 2024-01-17 DIAGNOSIS — R030 Elevated blood-pressure reading, without diagnosis of hypertension: Secondary | ICD-10-CM | POA: Diagnosis not present

## 2024-01-17 DIAGNOSIS — J069 Acute upper respiratory infection, unspecified: Secondary | ICD-10-CM | POA: Diagnosis not present

## 2024-02-13 DIAGNOSIS — R2681 Unsteadiness on feet: Secondary | ICD-10-CM | POA: Diagnosis not present

## 2024-02-13 DIAGNOSIS — M6281 Muscle weakness (generalized): Secondary | ICD-10-CM | POA: Diagnosis not present

## 2024-02-13 DIAGNOSIS — M545 Low back pain, unspecified: Secondary | ICD-10-CM | POA: Diagnosis not present

## 2024-02-15 DIAGNOSIS — E1122 Type 2 diabetes mellitus with diabetic chronic kidney disease: Secondary | ICD-10-CM | POA: Diagnosis not present

## 2024-02-19 DIAGNOSIS — M6281 Muscle weakness (generalized): Secondary | ICD-10-CM | POA: Diagnosis not present

## 2024-02-19 DIAGNOSIS — M545 Low back pain, unspecified: Secondary | ICD-10-CM | POA: Diagnosis not present

## 2024-02-19 DIAGNOSIS — R2681 Unsteadiness on feet: Secondary | ICD-10-CM | POA: Diagnosis not present

## 2024-02-27 DIAGNOSIS — N1832 Chronic kidney disease, stage 3b: Secondary | ICD-10-CM | POA: Diagnosis not present

## 2024-02-28 DIAGNOSIS — N1831 Chronic kidney disease, stage 3a: Secondary | ICD-10-CM | POA: Diagnosis not present

## 2024-02-28 DIAGNOSIS — M810 Age-related osteoporosis without current pathological fracture: Secondary | ICD-10-CM | POA: Diagnosis not present

## 2024-02-28 DIAGNOSIS — R413 Other amnesia: Secondary | ICD-10-CM | POA: Diagnosis not present

## 2024-02-28 DIAGNOSIS — D649 Anemia, unspecified: Secondary | ICD-10-CM | POA: Diagnosis not present

## 2024-03-07 ENCOUNTER — Ambulatory Visit: Payer: Medicare Other | Admitting: Neurology

## 2024-03-07 DIAGNOSIS — R600 Localized edema: Secondary | ICD-10-CM | POA: Diagnosis not present

## 2024-03-07 DIAGNOSIS — I5032 Chronic diastolic (congestive) heart failure: Secondary | ICD-10-CM | POA: Diagnosis not present

## 2024-03-07 DIAGNOSIS — I4891 Unspecified atrial fibrillation: Secondary | ICD-10-CM | POA: Diagnosis not present

## 2024-03-13 DIAGNOSIS — N281 Cyst of kidney, acquired: Secondary | ICD-10-CM | POA: Diagnosis not present

## 2024-03-13 DIAGNOSIS — R7989 Other specified abnormal findings of blood chemistry: Secondary | ICD-10-CM | POA: Diagnosis not present

## 2024-03-23 ENCOUNTER — Other Ambulatory Visit: Payer: Self-pay | Admitting: Cardiology

## 2024-04-20 ENCOUNTER — Inpatient Hospital Stay: Admission: RE | Admit: 2024-04-20 | Source: Ambulatory Visit

## 2024-04-23 ENCOUNTER — Ambulatory Visit
Admission: RE | Admit: 2024-04-23 | Discharge: 2024-04-23 | Disposition: A | Discharge status: Home / Self Care | Source: Ambulatory Visit | Attending: Internal Medicine | Admitting: Internal Medicine

## 2024-04-23 DIAGNOSIS — R413 Other amnesia: Secondary | ICD-10-CM

## 2024-05-19 ENCOUNTER — Other Ambulatory Visit: Payer: Self-pay

## 2024-05-19 ENCOUNTER — Emergency Department (HOSPITAL_COMMUNITY)
Admission: EM | Admit: 2024-05-19 | Discharge: 2024-05-20 | Disposition: A | Discharge status: Home / Self Care | Attending: Emergency Medicine | Admitting: Emergency Medicine

## 2024-05-19 ENCOUNTER — Encounter (HOSPITAL_COMMUNITY): Payer: Self-pay

## 2024-05-19 DIAGNOSIS — N281 Cyst of kidney, acquired: Secondary | ICD-10-CM | POA: Diagnosis not present

## 2024-05-19 DIAGNOSIS — I10 Essential (primary) hypertension: Secondary | ICD-10-CM | POA: Insufficient documentation

## 2024-05-19 DIAGNOSIS — I2510 Atherosclerotic heart disease of native coronary artery without angina pectoris: Secondary | ICD-10-CM | POA: Insufficient documentation

## 2024-05-19 DIAGNOSIS — D649 Anemia, unspecified: Secondary | ICD-10-CM | POA: Insufficient documentation

## 2024-05-19 DIAGNOSIS — Z7901 Long term (current) use of anticoagulants: Secondary | ICD-10-CM | POA: Insufficient documentation

## 2024-05-19 DIAGNOSIS — R1032 Left lower quadrant pain: Secondary | ICD-10-CM | POA: Diagnosis not present

## 2024-05-19 DIAGNOSIS — K529 Noninfective gastroenteritis and colitis, unspecified: Secondary | ICD-10-CM | POA: Insufficient documentation

## 2024-05-19 DIAGNOSIS — Z79899 Other long term (current) drug therapy: Secondary | ICD-10-CM | POA: Insufficient documentation

## 2024-05-19 DIAGNOSIS — E871 Hypo-osmolality and hyponatremia: Secondary | ICD-10-CM | POA: Diagnosis not present

## 2024-05-19 DIAGNOSIS — K5730 Diverticulosis of large intestine without perforation or abscess without bleeding: Secondary | ICD-10-CM | POA: Diagnosis not present

## 2024-05-19 LAB — CBC WITH DIFFERENTIAL/PLATELET
Abs Immature Granulocytes: 0.02 10*3/uL (ref 0.00–0.07)
Basophils Absolute: 0.1 10*3/uL (ref 0.0–0.1)
Basophils Relative: 1 %
Eosinophils Absolute: 0.1 10*3/uL (ref 0.0–0.5)
Eosinophils Relative: 2 %
HCT: 36.6 % (ref 36.0–46.0)
Hemoglobin: 11.8 g/dL — ABNORMAL LOW (ref 12.0–15.0)
Immature Granulocytes: 0 %
Lymphocytes Relative: 20 %
Lymphs Abs: 1.3 10*3/uL (ref 0.7–4.0)
MCH: 30.9 pg (ref 26.0–34.0)
MCHC: 32.2 g/dL (ref 30.0–36.0)
MCV: 95.8 fL (ref 80.0–100.0)
Monocytes Absolute: 0.9 10*3/uL (ref 0.1–1.0)
Monocytes Relative: 14 %
Neutro Abs: 4.2 10*3/uL (ref 1.7–7.7)
Neutrophils Relative %: 63 %
Platelets: 351 10*3/uL (ref 150–400)
RBC: 3.82 MIL/uL — ABNORMAL LOW (ref 3.87–5.11)
RDW: 14.9 % (ref 11.5–15.5)
WBC: 6.7 10*3/uL (ref 4.0–10.5)
nRBC: 0 % (ref 0.0–0.2)

## 2024-05-19 LAB — COMPREHENSIVE METABOLIC PANEL WITH GFR
ALT: 14 U/L (ref 0–44)
AST: 27 U/L (ref 15–41)
Albumin: 3.9 g/dL (ref 3.5–5.0)
Alkaline Phosphatase: 98 U/L (ref 38–126)
Anion gap: 15 (ref 5–15)
BUN: 26 mg/dL — ABNORMAL HIGH (ref 8–23)
CO2: 23 mmol/L (ref 22–32)
Calcium: 9.9 mg/dL (ref 8.9–10.3)
Chloride: 94 mmol/L — ABNORMAL LOW (ref 98–111)
Creatinine, Ser: 0.88 mg/dL (ref 0.44–1.00)
GFR, Estimated: >60 mL/min (ref >60)
Glucose, Bld: 184 mg/dL — ABNORMAL HIGH (ref 70–99)
Potassium: 4.2 mmol/L (ref 3.5–5.1)
Sodium: 132 mmol/L — ABNORMAL LOW (ref 135–145)
Total Bilirubin: 0.6 mg/dL (ref 0.0–1.2)
Total Protein: 7.1 g/dL (ref 6.5–8.1)

## 2024-05-19 LAB — URINALYSIS, ROUTINE W REFLEX MICROSCOPIC
Bacteria, UA: NONE SEEN
Bilirubin Urine: NEGATIVE
Glucose, UA: >=500 mg/dL — AB
Hgb urine dipstick: NEGATIVE
Ketones, ur: NEGATIVE mg/dL
Leukocytes,Ua: NEGATIVE
Nitrite: NEGATIVE
Protein, ur: NEGATIVE mg/dL
Specific Gravity, Urine: 1.018 (ref 1.005–1.030)
pH: 6 (ref 5.0–8.0)

## 2024-05-19 LAB — LIPASE, BLOOD: Lipase: 33 U/L (ref 11–51)

## 2024-05-19 MED ORDER — ACETAMINOPHEN 325 MG PO TABS
325.0000 mg | ORAL_TABLET | Freq: Once | ORAL | Status: AC
  Administered 2024-05-19: 650 mg via ORAL
  Filled 2024-05-19: qty 1

## 2024-05-19 NOTE — ED Triage Notes (Signed)
 Complaining of abdominal pain that started this morning. Has hx of diverticulitis multiple times over the few years and she feels that this is the same. Abdominal pain on the left lower quadrant and some nausea.

## 2024-05-19 NOTE — ED Provider Triage Note (Signed)
 Emergency Medicine Provider Triage Evaluation Note  Heather Olsen , a 89 y.o. female  was evaluated in triage.  Pt complains of left lower quadrant abdominal pain that primarily began this morning but that has worsened throughout the day.  Has previous episodes of diverticulitis and states this feels consistent with previous episodes.  Has not had a bowel movement in several days.  Does have mild nausea but does not complain of any vomiting..  Review of Systems  Positive: Left lower quadrant abdominal pain, nausea, constipation Negative: Diarrhea  Physical Exam  BP (!) 154/76 (BP Location: Right Arm)   Pulse 60   Temp 98.4 F (36.9 C)   Resp (!) 22   Ht 5' 4 (1.626 m)   Wt 46.3 kg   SpO2 95%   BMI 17.51 kg/m  Gen:   Awake, no distress   Resp:  Normal effort  MSK:   Moves extremities without difficulty  Other:  Bowel sounds are present and normal, abdomen is exquisitely tender to the left lower quadrant.  Medical Decision Making  Medically screening exam initiated at 8:35 PM.  Appropriate orders placed.  [name] was informed that the remainder of the evaluation will be completed by another provider, this initial triage assessment does not replace that evaluation, and the importance of remaining in the ED until their evaluation is complete.  The findings are consistent with potential for diverticulitis, also have differential of colitis, mesenteric ischemia.  CT scan ordered for the abdomen and lab evaluation as noted.   Heather Olsen 05/19/24 2036

## 2024-05-20 ENCOUNTER — Emergency Department (HOSPITAL_COMMUNITY)

## 2024-05-20 ENCOUNTER — Telehealth: Payer: Self-pay | Admitting: *Deleted

## 2024-05-20 DIAGNOSIS — K5730 Diverticulosis of large intestine without perforation or abscess without bleeding: Secondary | ICD-10-CM | POA: Diagnosis not present

## 2024-05-20 DIAGNOSIS — K529 Noninfective gastroenteritis and colitis, unspecified: Secondary | ICD-10-CM | POA: Diagnosis not present

## 2024-05-20 DIAGNOSIS — N281 Cyst of kidney, acquired: Secondary | ICD-10-CM | POA: Diagnosis not present

## 2024-05-20 DIAGNOSIS — R1032 Left lower quadrant pain: Secondary | ICD-10-CM | POA: Diagnosis not present

## 2024-05-20 MED ORDER — AMOXICILLIN-POT CLAVULANATE 875-125 MG PO TABS
1.0000 | ORAL_TABLET | Freq: Once | ORAL | Status: AC
  Administered 2024-05-20: 1 via ORAL
  Filled 2024-05-20: qty 1

## 2024-05-20 MED ORDER — OXYCODONE-ACETAMINOPHEN 5-325 MG PO TABS
1.0000 | ORAL_TABLET | Freq: Once | ORAL | Status: AC
  Administered 2024-05-20: 1 via ORAL
  Filled 2024-05-20: qty 1

## 2024-05-20 MED ORDER — AMOXICILLIN-POT CLAVULANATE 875-125 MG PO TABS
1.0000 | ORAL_TABLET | Freq: Two times a day (BID) | ORAL | 0 refills | Status: AC
Start: 2024-05-20 — End: ?

## 2024-05-20 MED ORDER — ONDANSETRON 4 MG PO TBDP: 4.0000 mg | ORAL_TABLET | Freq: Three times a day (TID) | ORAL | 0 refills | Status: AC | PRN

## 2024-05-20 MED ORDER — ONDANSETRON HCL 4 MG/2ML IJ SOLN
4.0000 mg | Freq: Once | INTRAMUSCULAR | Status: AC
  Administered 2024-05-20: 4 mg via INTRAVENOUS
  Filled 2024-05-20: qty 2

## 2024-05-20 MED ORDER — IOHEXOL 350 MG/ML SOLN
75.0000 mL | Freq: Once | INTRAVENOUS | Status: AC | PRN
  Administered 2024-05-20: 75 mL via INTRAVENOUS

## 2024-05-20 MED ORDER — MORPHINE SULFATE (PF) 4 MG/ML IV SOLN
4.0000 mg | Freq: Once | INTRAVENOUS | Status: AC
  Administered 2024-05-20: 4 mg via INTRAVENOUS
  Filled 2024-05-20: qty 1

## 2024-05-20 MED ORDER — OXYCODONE HCL 5 MG PO TABS: 5.0000 mg | ORAL_TABLET | ORAL | 0 refills | Status: AC | PRN

## 2024-05-20 NOTE — Discharge Instructions (Addendum)
 Please take polyethylene glycol (MiraLAX) for your constipation.  Return to the emergency department if pain is not being adequately controlled at home, or if you start running a fever or have vomiting which is not controlled with ondansetron .  It is very important that you go to your primary care provider for a recheck in 2 days to make sure that things are improving.

## 2024-05-20 NOTE — Telephone Encounter (Signed)
 Pharmacy called related to Rx: oxycodone  with pt already prescribed tramadol  .SABRASABRAEDCM clarified with EDP note to find that EDP wrote: She has tramadol  at home. I am discharging her with prescriptions for amoxicillin -clavulanate, ondansetron  oral dissolving tablet, and a small number of oxycodone  tablets.  RNCM read note to Adria, Pharm D who states she will educate pt on taking both Rx.

## 2024-05-20 NOTE — ED Provider Notes (Signed)
 [place] EMERGENCY DEPARTMENT AT [facility] Provider Note   CSN: [id number] Arrival date & time: 05/19/24  8062     Patient presents with: Abdominal Pain   Heather Olsen is a 89 y.o. female.   The history is provided by the patient.  Abdominal Pain She has history of hypertension, hyperlipidemia, coronary artery disease, pernicious anemia, Raynaud's disease, diverticulitis and comes in with left lower quadrant pain which started this afternoon.  Pain is typical of her diverticulitis.  There is associated nausea but no vomiting.  She denies fever chills or sweats.   Prior to Admission medications   Medication Sig Start Date End Date Taking? Authorizing Provider  acetaminophen  (TYLENOL ) 650 MG CR tablet Take 650 mg by mouth daily as needed for pain.    [provider]  denosumab  (PROLIA ) 60 MG/ML SOSY injection Inject 60 mg into the skin every 6 (six) months. 07/25/19   [provider]  dicyclomine  (BENTYL ) 20 MG tablet Take 1 tablet (20 mg total) by mouth 2 (two) times daily. 12/20/23   Smoot, Lauraine LABOR, PA-C  donepezil  (ARICEPT ) 5 MG tablet Take 5 mg by mouth at bedtime. 11/20/23   [provider]  ELIQUIS  2.5 MG TABS tablet Take 2.5 mg by mouth 2 (two) times daily. 02/03/20   [provider]  empagliflozin  (JARDIANCE ) 10 MG TABS tablet Take 1 tablet (10 mg total) by mouth daily. 09/13/23   Fairy Frames, MD  furosemide  (LASIX ) 40 MG tablet Take 1 tablet (40 mg total) by mouth daily. Patient not taking: Reported on 12/21/2023 08/15/23   Lavona Agent, MD  ipratropium (ATROVENT ) 0.06 % nasal spray Use 2 sprays in each nostril up to three times daily before meals for sinus drainage. Patient taking differently: Place 2 sprays into the nose daily as needed for rhinitis. 09/30/16   Landy Rome MATSU, MD  LINZESS  145 MCG CAPS capsule Take 145 mcg by mouth daily as needed (constipation). 07/29/22   [provider]  metFORMIN (GLUCOPHAGE-XR) 500  MG 24 hr tablet Take 1,000 mg by mouth at bedtime. 02/01/22   [provider]  mirabegron  ER (MYRBETRIQ ) 50 MG TB24 tablet Take 50 mg by mouth at bedtime. 08/11/21   [provider]  mirtazapine  (REMERON ) 15 MG tablet Take 15 mg by mouth at bedtime. 11/14/23   [provider]  MULTIPLE VITAMINS-MINERALS PO Take 1 tablet by mouth daily.     [provider]  nitroGLYCERIN  (NITROSTAT ) 0.4 MG SL tablet Place 1 tablet (0.4 mg total) under the tongue every 5 (five) minutes as needed for chest pain. Place one tablet under the tongue every 5 minutes as needed for chest pain. No more than 3 Patient not taking: Reported on 12/28/2023 08/09/22   Lavona Agent, MD  pantoprazole  (PROTONIX ) 40 MG tablet Take 40 mg by mouth 2 (two) times daily. 12/18/19   [provider]  pindolol  (VISKEN ) 5 MG tablet Take 0.5 tablets (2.5 mg total) by mouth 2 (two) times daily. 03/26/24   Lavona Agent, MD  potassium chloride  SA (KLOR-CON  M) 20 MEQ tablet Take 1 tablet (20 mEq total) by mouth daily. Patient not taking: Reported on 12/21/2023 09/13/23   Fairy Frames, MD  Probiotic Product (PROBIOTIC DAILY PO) Take 1 capsule by mouth at bedtime.    [provider]  rOPINIRole  (REQUIP ) 0.5 MG tablet Take 0.5 mg by mouth at bedtime. 07/13/22   [provider]  rosuvastatin  (CRESTOR ) 10 MG tablet TAKE 1 TABLET BY MOUTH AT  BEDTIME Patient taking differently: Take 10 mg by mouth at bedtime. 11/23/20   Sheree Penne Bruckner, MD  sertraline  (ZOLOFT ) 50 MG tablet Take 50 mg by mouth daily.    [provider]  traMADol  (ULTRAM ) 50 MG tablet TAKE 1 TABLET BY MOUTH EVERY 4 HOURS Patient not taking: Reported on 12/28/2023 11/14/16   Cloria Riggs L, DO  VITAMIN D PO Take 1 capsule by mouth at bedtime.    [provider]    Allergies: Sulfasalazine, Topiramate, Ace inhibitors, Codeine, Ibandronate, Meloxicam, Prevacid [lansoprazole], Sulfa antibiotics,  Verapamil, and Zolpidem    Review of Systems  Gastrointestinal:  Positive for abdominal pain.  All other systems reviewed and are negative.   Updated Vital Signs BP (!) 168/86   Pulse (!) 54   Temp 98.6 F (37 C) (Oral)   Resp 18   Ht 5' 4 (1.626 m)   Wt 46.3 kg   SpO2 100%   BMI 17.51 kg/m   Physical Exam Vitals and nursing note reviewed.   89 year old female, resting comfortably and in no acute distress. Vital signs are significant for elevated blood pressure and borderline slow heart rate. Oxygen saturation is 100%, which is normal. Head is normocephalic and atraumatic. PERRLA, EOMI.  Lungs are clear without rales, wheezes, or rhonchi. Chest is nontender. Heart has regular rate and rhythm without murmur. Abdomen is soft, flat, with moderate left lower quadrant tenderness.  There is no rebound or guarding. Extremities have no cyanosis or edema, full range of motion is present. Skin is warm and dry without rash. Neurologic: Awake and alert, moves all extremities equally  (all labs ordered are listed, but only abnormal results are displayed) Labs Reviewed  CBC WITH DIFFERENTIAL/PLATELET - Abnormal; Notable for the following components:      Result Value   RBC 3.82 (*)    Hemoglobin 11.8 (*)    All other components within normal limits  COMPREHENSIVE METABOLIC PANEL WITH GFR - Abnormal; Notable for the following components:   Sodium 132 (*)    Chloride 94 (*)    Glucose, Bld 184 (*)    BUN 26 (*)    All other components within normal limits  URINALYSIS, ROUTINE W REFLEX MICROSCOPIC - Abnormal; Notable for the following components:   Glucose, UA >=500 (*)    All other components within normal limits  LIPASE, BLOOD   Radiology: CT ABDOMEN PELVIS W CONTRAST Result Date: 05/20/2024 CLINICAL DATA:  Left lower quadrant abdominal pain. EXAM: CT ABDOMEN AND PELVIS WITH CONTRAST TECHNIQUE: Multidetector CT imaging of the abdomen and pelvis was performed using the standard  protocol following bolus administration of intravenous contrast. RADIATION DOSE REDUCTION: This exam was performed according to the departmental dose-optimization program which includes automated exposure control, adjustment of the mA and/or kV according to patient size and/or use of iterative reconstruction technique. CONTRAST:  75mL OMNIPAQUE  IOHEXOL  350 MG/ML SOLN COMPARISON:  12/18/2023 FINDINGS: Lower chest: Cardiac enlargement. No pleural fluid or consolidative change within the visualized portions of the lung bases. Hepatobiliary: No focal liver abnormality is seen. No gallstones, gallbladder wall thickening, or biliary dilatation. Pancreas: Unremarkable. No pancreatic ductal dilatation or surrounding inflammatory changes. Spleen: Normal in size without focal abnormality. Adrenals/Urinary Tract: Normal adrenal glands. Scattered bilateral Bosniak class 1 and 2 kidney cysts. The largest cyst is in the lateral cortex of the right kidney measuring 0.9 cm, image 25/4. No follow-up imaging recommended. No signs of nephrolithiasis or obstructive uropathy. Urinary bladder appears within normal limits.  Stomach/Bowel: None stomach appears nondistended. No pathologic dilatation of the large or small bowel loops to suggest obstruction. The appendix is not visualized. Moderate stool burden throughout the colon and rectum. Colonic diverticulosis identified. This is severe within the sigmoid colon. Mild wall thickening of the descending colon and sigmoid colon is identified with some mild hazy soft tissue stranding about the descending colon. No significant free fluid or fluid collections. No focally inflamed diverticula identified. No signs of pneumatosis, perforation or abscess. Vascular/Lymphatic: Aortic atherosclerosis without aneurysm. Patent abdominal vascularity. No signs of portal venous gas. No enlarged abdominopelvic lymph nodes. Reproductive: Uterus and bilateral adnexa are unremarkable. Other: No free fluid or  fluid collections. Musculoskeletal: Severe thoracolumbar scoliosis deformity with multilevel degenerative disc disease. Thoracolumbar spondylosis identified. Chronic anterolisthesis of L4 on L5 measures 4 mm. IMPRESSION: 1. Mild wall thickening of the descending colon and sigmoid colon is identified with some mild hazy soft tissue stranding about the descending colon. No significant free fluid or fluid collections. No focally inflamed diverticula identified. Findings are favored to represent a nonspecific infectious or inflammatory colitis. 2. No specific free fluid or fluid collections. 3. Moderate stool burden throughout the colon and rectum. Correlate for signs/symptoms of constipation. 4. Severe thoracolumbar scoliosis deformity with multilevel degenerative disc disease. 5.  Aortic Atherosclerosis (ICD10-I70.0). Electronically Signed   By: Waddell Calk M.D.   On: 05/20/2024 05:57     Procedures   Medications Ordered in the ED  amoxicillin -clavulanate (AUGMENTIN ) 875-125 MG per tablet 1 tablet (has no administration in time range)  oxyCODONE -acetaminophen  (PERCOCET/ROXICET) 5-325 MG per tablet 1 tablet (has no administration in time range)  acetaminophen  (TYLENOL ) tablet 325 mg (650 mg Oral Given 05/19/24 2219)  morphine  (PF) 4 MG/ML injection 4 mg (4 mg Intravenous Given 05/20/24 0319)  ondansetron  (ZOFRAN ) injection 4 mg (4 mg Intravenous Given 05/20/24 0318)  iohexol  (OMNIPAQUE ) 350 MG/ML injection 75 mL (75 mLs Intravenous Contrast Given 05/20/24 0529)                                    Medical Decision Making Amount and/or Complexity of Data Reviewed Radiology: ordered.  Risk OTC drugs. Prescription drug management.   Left lower quadrant pain.  This is a presentation which entails a wide range of treatment options and carries with it a high risk of morbidity and complications.  Differential diagnosis includes, but is not limited to, diverticulitis and occluding complication such as  perforation or abscess, urolithiasis, pyelonephritis, abdominal aortic aneurysm.  I have reviewed her laboratory tests, and my interpretation is stable anemia, normal WBC with differential, mild hyponatremia, elevated random glucose level, elevated BUN relative to creatinine, normal lipase, urinalysis showing no evidence of UTI and incidental finding of glycosuria.  I have reviewed her past records, and note hospitalization 12/04/2023-12/06/2023 for diverticulitis.  I have ordered CT of abdomen and pelvis as well as morphine  for pain and ondansetron  for nausea.  CT scan shows thickening of the descending and sigmoid colon with some soft tissue stranding which most likely is infectious colitis as opposed to diverticulitis.  I have independently viewed the images, and agree with the radiologist's interpretation.  High stool burden is noted and patient does admit to being constipated.  I have reexamined her and she has mild to moderate tenderness in the left lower quadrant but exam is overall benign.  She feels comfortable with being discharged as long as she has some  medication for pain.  She has tramadol  at home.  I am discharging her with prescriptions for amoxicillin -clavulanate, ondansetron  oral dissolving tablet, and a small number of oxycodone  tablets.  I have given her strict return precautions and urged her to have follow-up with her primary care provider in 2 days for recheck.     Final diagnoses:  Colitis  Hyponatremia  Normochromic normocytic anemia    ED Discharge Orders          Ordered    amoxicillin -clavulanate (AUGMENTIN ) 875-125 MG tablet  Every 12 hours        05/20/24 0622    oxyCODONE  (ROXICODONE ) 5 MG immediate release tablet  Every 4 hours PRN        05/20/24 0622    ondansetron  (ZOFRAN -ODT) 4 MG disintegrating tablet  Every 8 hours PRN        05/20/24 [id number]               Raford Lenis, MD 05/20/24 [phone]

## 2024-05-23 DIAGNOSIS — K529 Noninfective gastroenteritis and colitis, unspecified: Secondary | ICD-10-CM | POA: Diagnosis not present

## 2024-05-23 DIAGNOSIS — E871 Hypo-osmolality and hyponatremia: Secondary | ICD-10-CM | POA: Diagnosis not present

## 2024-06-04 DIAGNOSIS — I5032 Chronic diastolic (congestive) heart failure: Secondary | ICD-10-CM | POA: Diagnosis not present

## 2024-06-04 DIAGNOSIS — Z0000 Encounter for general adult medical examination without abnormal findings: Secondary | ICD-10-CM | POA: Diagnosis not present

## 2024-06-04 DIAGNOSIS — K219 Gastro-esophageal reflux disease without esophagitis: Secondary | ICD-10-CM | POA: Diagnosis not present

## 2024-06-04 DIAGNOSIS — E1122 Type 2 diabetes mellitus with diabetic chronic kidney disease: Secondary | ICD-10-CM | POA: Diagnosis not present

## 2024-06-04 DIAGNOSIS — D519 Vitamin B12 deficiency anemia, unspecified: Secondary | ICD-10-CM | POA: Diagnosis not present

## 2024-06-04 DIAGNOSIS — M810 Age-related osteoporosis without current pathological fracture: Secondary | ICD-10-CM | POA: Diagnosis not present

## 2024-06-04 DIAGNOSIS — I2510 Atherosclerotic heart disease of native coronary artery without angina pectoris: Secondary | ICD-10-CM | POA: Diagnosis not present

## 2024-06-04 DIAGNOSIS — I4891 Unspecified atrial fibrillation: Secondary | ICD-10-CM | POA: Diagnosis not present

## 2024-06-04 DIAGNOSIS — D6859 Other primary thrombophilia: Secondary | ICD-10-CM | POA: Diagnosis not present

## 2024-06-04 DIAGNOSIS — M419 Scoliosis, unspecified: Secondary | ICD-10-CM | POA: Diagnosis not present

## 2024-06-04 DIAGNOSIS — I10 Essential (primary) hypertension: Secondary | ICD-10-CM | POA: Diagnosis not present

## 2024-06-04 DIAGNOSIS — E782 Mixed hyperlipidemia: Secondary | ICD-10-CM | POA: Diagnosis not present

## 2024-06-10 DIAGNOSIS — E1122 Type 2 diabetes mellitus with diabetic chronic kidney disease: Secondary | ICD-10-CM | POA: Diagnosis not present

## 2024-06-17 DIAGNOSIS — R1030 Lower abdominal pain, unspecified: Secondary | ICD-10-CM | POA: Diagnosis not present

## 2024-06-17 DIAGNOSIS — Z8719 Personal history of other diseases of the digestive system: Secondary | ICD-10-CM | POA: Diagnosis not present

## 2024-06-25 DIAGNOSIS — N390 Urinary tract infection, site not specified: Secondary | ICD-10-CM | POA: Diagnosis not present

## 2024-07-01 DIAGNOSIS — M25562 Pain in left knee: Secondary | ICD-10-CM | POA: Diagnosis not present

## 2024-07-10 ENCOUNTER — Encounter (HOSPITAL_BASED_OUTPATIENT_CLINIC_OR_DEPARTMENT_OTHER): Payer: Self-pay

## 2024-07-10 ENCOUNTER — Emergency Department (HOSPITAL_BASED_OUTPATIENT_CLINIC_OR_DEPARTMENT_OTHER)

## 2024-07-10 ENCOUNTER — Other Ambulatory Visit: Payer: Self-pay

## 2024-07-10 ENCOUNTER — Emergency Department (HOSPITAL_BASED_OUTPATIENT_CLINIC_OR_DEPARTMENT_OTHER)
Admission: EM | Admit: 2024-07-10 | Discharge: 2024-07-10 | Disposition: A | Discharge status: Home / Self Care | Attending: Emergency Medicine | Admitting: Emergency Medicine

## 2024-07-10 DIAGNOSIS — Z7984 Long term (current) use of oral hypoglycemic drugs: Secondary | ICD-10-CM | POA: Diagnosis not present

## 2024-07-10 DIAGNOSIS — Z79899 Other long term (current) drug therapy: Secondary | ICD-10-CM | POA: Diagnosis not present

## 2024-07-10 DIAGNOSIS — E871 Hypo-osmolality and hyponatremia: Secondary | ICD-10-CM | POA: Diagnosis not present

## 2024-07-10 DIAGNOSIS — I2510 Atherosclerotic heart disease of native coronary artery without angina pectoris: Secondary | ICD-10-CM | POA: Insufficient documentation

## 2024-07-10 DIAGNOSIS — I10 Essential (primary) hypertension: Secondary | ICD-10-CM | POA: Insufficient documentation

## 2024-07-10 DIAGNOSIS — E119 Type 2 diabetes mellitus without complications: Secondary | ICD-10-CM | POA: Insufficient documentation

## 2024-07-10 DIAGNOSIS — K5730 Diverticulosis of large intestine without perforation or abscess without bleeding: Secondary | ICD-10-CM | POA: Diagnosis not present

## 2024-07-10 DIAGNOSIS — R1032 Left lower quadrant pain: Secondary | ICD-10-CM | POA: Diagnosis not present

## 2024-07-10 DIAGNOSIS — Z7901 Long term (current) use of anticoagulants: Secondary | ICD-10-CM | POA: Diagnosis not present

## 2024-07-10 DIAGNOSIS — R935 Abnormal findings on diagnostic imaging of other abdominal regions, including retroperitoneum: Secondary | ICD-10-CM | POA: Diagnosis not present

## 2024-07-10 LAB — URINALYSIS, ROUTINE W REFLEX MICROSCOPIC
Bacteria, UA: NONE SEEN
Bilirubin Urine: NEGATIVE
Glucose, UA: >1000 mg/dL — AB
Hgb urine dipstick: NEGATIVE
Ketones, ur: NEGATIVE mg/dL
Leukocytes,Ua: NEGATIVE
Nitrite: NEGATIVE
Protein, ur: NEGATIVE mg/dL
Specific Gravity, Urine: 1.016 (ref 1.005–1.030)
pH: 6 (ref 5.0–8.0)

## 2024-07-10 LAB — LIPASE, BLOOD: Lipase: 30 U/L (ref 11–51)

## 2024-07-10 LAB — COMPREHENSIVE METABOLIC PANEL WITH GFR
ALT: 13 U/L (ref 0–44)
AST: 27 U/L (ref 15–41)
Albumin: 4 g/dL (ref 3.5–5.0)
Alkaline Phosphatase: 137 U/L — ABNORMAL HIGH (ref 38–126)
Anion gap: 14 (ref 5–15)
BUN: 31 mg/dL — ABNORMAL HIGH (ref 8–23)
CO2: 23 mmol/L (ref 22–32)
Calcium: 9.9 mg/dL (ref 8.9–10.3)
Chloride: 95 mmol/L — ABNORMAL LOW (ref 98–111)
Creatinine, Ser: 0.95 mg/dL (ref 0.44–1.00)
GFR, Estimated: 56 mL/min — ABNORMAL LOW (ref >60)
Glucose, Bld: 185 mg/dL — ABNORMAL HIGH (ref 70–99)
Potassium: 4.7 mmol/L (ref 3.5–5.1)
Sodium: 132 mmol/L — ABNORMAL LOW (ref 135–145)
Total Bilirubin: 0.3 mg/dL (ref 0.0–1.2)
Total Protein: 6.8 g/dL (ref 6.5–8.1)

## 2024-07-10 LAB — CBC WITH DIFFERENTIAL/PLATELET
Abs Immature Granulocytes: 0.02 K/uL (ref 0.00–0.07)
Basophils Absolute: 0.1 K/uL (ref 0.0–0.1)
Basophils Relative: 1 %
Eosinophils Absolute: 0.2 K/uL (ref 0.0–0.5)
Eosinophils Relative: 4 %
HCT: 36.9 % (ref 36.0–46.0)
Hemoglobin: 12 g/dL (ref 12.0–15.0)
Immature Granulocytes: 0 %
Lymphocytes Relative: 24 %
Lymphs Abs: 1.5 K/uL (ref 0.7–4.0)
MCH: 31 pg (ref 26.0–34.0)
MCHC: 32.5 g/dL (ref 30.0–36.0)
MCV: 95.3 fL (ref 80.0–100.0)
Monocytes Absolute: 0.8 K/uL (ref 0.1–1.0)
Monocytes Relative: 12 %
Neutro Abs: 3.7 K/uL (ref 1.7–7.7)
Neutrophils Relative %: 59 %
Platelets: 303 K/uL (ref 150–400)
RBC: 3.87 MIL/uL (ref 3.87–5.11)
RDW: 16.6 % — ABNORMAL HIGH (ref 11.5–15.5)
WBC: 6.3 K/uL (ref 4.0–10.5)
nRBC: 0 % (ref 0.0–0.2)

## 2024-07-10 MED ORDER — IOHEXOL 300 MG/ML  SOLN
80.0000 mL | Freq: Once | INTRAMUSCULAR | Status: AC | PRN
Start: 2024-07-10 — End: 2024-07-10
  Administered 2024-07-10 (×2): 80 mL via INTRAVENOUS

## 2024-07-10 MED ORDER — POLYETHYLENE GLYCOL 3350 17 GM/SCOOP PO POWD
17.0000 g | Freq: Every day | ORAL | 0 refills | Status: AC
  Filled 2024-07-10: qty 238, 14d supply, fill #0
  Filled 2024-07-10: qty 14, 14d supply, fill #0

## 2024-07-10 NOTE — ED Triage Notes (Signed)
 Patient arrives ambulatory to the ED with complaints of left side abdominal pain x2 hours. Patient reports a history of diverticulitis and her symptoms feel the same.

## 2024-07-10 NOTE — Discharge Instructions (Addendum)
 You were seen today for left lower quadrant abdominal pain.  Your CT scan today was reassuring, did show a lot of scrappable but no signs of diverticulitis.  Would recommend you continue take MiraLAX  for this stool, take till your stool is soft.  You can find this at any pharmacy as well as have sent to prescription for you.  However, I would recommend you continue to follow-up with your PCP within the next couple of days to ensure that your symptoms have gone away.   Return for any new or worsening symptoms reviewed include worsening abdominal pain, nausea, vomiting, blood in the stool, uncontrollable pain

## 2024-07-10 NOTE — ED Provider Notes (Signed)
 [place] EMERGENCY DEPARTMENT AT [facility] Provider Note   CSN: [id number] Arrival date & time: 07/10/24  1645     Patient presents with: Abdominal Pain   Heather Olsen is a 89 y.o. female.   Abdominal Pain Patient is a 89 year old female presented the ED today for concerns for left lower quad abdominal pain similar to her previous episodes of diverticulitis, saying that this onset around lunch this afternoon after she was eating.  Reported that she has had persistent dull, aching pain.  Previous medical history of HTN, HLD, hiatal hernia, CAD, pernicious anemia, Raynaud's disease, diverticulosis, type 2 diabetes, paroxysmal A-fib.   Denies fever, headache, vision changes, chest pain, shortness of breath, nausea, vomiting, diarrhea, melena, hematochezia, dysuria, hematuria, vaginal pain/discharge/bleeding, lower leg swelling.    Prior to Admission medications   Medication Sig Start Date End Date Taking? Authorizing Provider  polyethylene glycol (MIRALAX ) 17 g packet Take 17 g by mouth daily. 07/10/24  Yes Mohanad Carsten S, PA-C  acetaminophen  (TYLENOL ) 650 MG CR tablet Take 650 mg by mouth daily as needed for pain.    [provider]  amoxicillin -clavulanate (AUGMENTIN ) 875-125 MG tablet Take 1 tablet by mouth every 12 (twelve) hours. 05/20/24   Raford Lenis, MD  denosumab  (PROLIA ) 60 MG/ML SOSY injection Inject 60 mg into the skin every 6 (six) months. 07/25/19   [provider]  dicyclomine  (BENTYL ) 20 MG tablet Take 1 tablet (20 mg total) by mouth 2 (two) times daily. 12/20/23   Smoot, Lauraine LABOR, PA-C  donepezil  (ARICEPT ) 5 MG tablet Take 5 mg by mouth at bedtime. 11/20/23   [provider]  ELIQUIS  2.5 MG TABS tablet Take 2.5 mg by mouth 2 (two) times daily. 02/03/20   [provider]  empagliflozin  (JARDIANCE ) 10 MG TABS tablet Take 1 tablet (10 mg total) by mouth daily. 09/13/23   Fairy Frames, MD  furosemide  (LASIX ) 40 MG tablet Take  1 tablet (40 mg total) by mouth daily. Patient not taking: Reported on 12/21/2023 08/15/23   Lavona Agent, MD  ipratropium (ATROVENT ) 0.06 % nasal spray Use 2 sprays in each nostril up to three times daily before meals for sinus drainage. Patient taking differently: Place 2 sprays into the nose daily as needed for rhinitis. 09/30/16   Landy Rome MATSU, MD  LINZESS  145 MCG CAPS capsule Take 145 mcg by mouth daily as needed (constipation). 07/29/22   [provider]  metFORMIN (GLUCOPHAGE-XR) 500 MG 24 hr tablet Take 1,000 mg by mouth at bedtime. 02/01/22   [provider]  mirabegron  ER (MYRBETRIQ ) 50 MG TB24 tablet Take 50 mg by mouth at bedtime. 08/11/21   [provider]  mirtazapine  (REMERON ) 15 MG tablet Take 15 mg by mouth at bedtime. 11/14/23   [provider]  MULTIPLE VITAMINS-MINERALS PO Take 1 tablet by mouth daily.     [provider]  nitroGLYCERIN  (NITROSTAT ) 0.4 MG SL tablet Place 1 tablet (0.4 mg total) under the tongue every 5 (five) minutes as needed for chest pain. Place one tablet under the tongue every 5 minutes as needed for chest pain. No more than 3 Patient not taking: Reported on 12/28/2023 08/09/22   Lavona Agent, MD  ondansetron  (ZOFRAN -ODT) 4 MG disintegrating tablet Take 1 tablet (4 mg total) by mouth every 8 (eight) hours as needed for nausea or vomiting. 05/20/24   Raford Lenis, MD  oxyCODONE  (ROXICODONE ) 5 MG immediate release tablet Take 1 tablet (5 mg total) by mouth every 4 (four)  hours as needed for severe pain (pain score 7-10). 05/20/24   Raford Lenis, MD  pantoprazole  (PROTONIX ) 40 MG tablet Take 40 mg by mouth 2 (two) times daily. 12/18/19   [provider]  pindolol  (VISKEN ) 5 MG tablet Take 0.5 tablets (2.5 mg total) by mouth 2 (two) times daily. 03/26/24   Lavona Agent, MD  potassium chloride  SA (KLOR-CON  M) 20 MEQ tablet Take 1 tablet (20 mEq total) by mouth daily. Patient not taking: Reported on 12/21/2023  09/13/23   Fairy Frames, MD  Probiotic Product (PROBIOTIC DAILY PO) Take 1 capsule by mouth at bedtime.    [provider]  rOPINIRole  (REQUIP ) 0.5 MG tablet Take 0.5 mg by mouth at bedtime. 07/13/22   [provider]  rosuvastatin  (CRESTOR ) 10 MG tablet TAKE 1 TABLET BY MOUTH AT BEDTIME Patient taking differently: Take 10 mg by mouth at bedtime. 11/23/20   Sheree Penne Bruckner, MD  sertraline  (ZOLOFT ) 50 MG tablet Take 50 mg by mouth daily.    [provider]  traMADol  (ULTRAM ) 50 MG tablet TAKE 1 TABLET BY MOUTH EVERY 4 HOURS Patient not taking: Reported on 12/28/2023 11/14/16   Cloria Riggs L, DO  VITAMIN D PO Take 1 capsule by mouth at bedtime.    [provider]    Allergies: Sulfasalazine, Topiramate, Ace inhibitors, Codeine, Ibandronate, Meloxicam, Prevacid [lansoprazole], Sulfa antibiotics, Verapamil, and Zolpidem    Review of Systems  Gastrointestinal:  Positive for abdominal pain.  All other systems reviewed and are negative.   Updated Vital Signs BP (!) 157/80   Pulse (!) 56   Temp 98.7 F (37.1 C) (Oral)   Resp 18   Ht 5' 4 (1.626 m)   Wt 44.5 kg   SpO2 96%   BMI 16.82 kg/m   Physical Exam Vitals and nursing note reviewed.  Constitutional:      General: She is not in acute distress.    Appearance: Normal appearance. She is not ill-appearing or diaphoretic.  HENT:     Head: Normocephalic and atraumatic.  Eyes:     General: No scleral icterus.       Right eye: No discharge.        Left eye: No discharge.     Extraocular Movements: Extraocular movements intact.     Conjunctiva/sclera: Conjunctivae normal.  Cardiovascular:     Rate and Rhythm: Normal rate and regular rhythm.     Pulses: Normal pulses.     Heart sounds: Normal heart sounds. No murmur heard.    No friction rub. No gallop.  Pulmonary:     Effort: Pulmonary effort is normal. No respiratory distress.     Breath sounds: No stridor. No wheezing, rhonchi or  rales.  Chest:     Chest wall: No tenderness.  Abdominal:     General: Abdomen is flat. There is no distension. There are no signs of injury.     Palpations: Abdomen is soft.     Tenderness: There is abdominal tenderness in the left lower quadrant. There is no right CVA tenderness, left CVA tenderness, guarding or rebound.     Hernia: No hernia is present.  Musculoskeletal:        General: No swelling, deformity or signs of injury.     Cervical back: Normal range of motion. No rigidity.     Right lower leg: No edema.     Left lower leg: No edema.  Skin:    General: Skin is warm and dry.  Findings: No bruising, erythema or lesion.  Neurological:     General: No focal deficit present.     Mental Status: She is alert and oriented to person, place, and time. Mental status is at baseline.     Sensory: No sensory deficit.     Motor: No weakness.  Psychiatric:        Mood and Affect: Mood normal.     (all labs ordered are listed, but only abnormal results are displayed) Labs Reviewed  CBC WITH DIFFERENTIAL/PLATELET - Abnormal; Notable for the following components:      Result Value   RDW 16.6 (*)    All other components within normal limits  COMPREHENSIVE METABOLIC PANEL WITH GFR - Abnormal; Notable for the following components:   Sodium 132 (*)    Chloride 95 (*)    Glucose, Bld 185 (*)    BUN 31 (*)    Alkaline Phosphatase 137 (*)    GFR, Estimated 56 (*)    All other components within normal limits  URINALYSIS, ROUTINE W REFLEX MICROSCOPIC - Abnormal; Notable for the following components:   Glucose, UA >1,000 (*)    All other components within normal limits  LIPASE, BLOOD    EKG: None  Radiology: CT ABDOMEN PELVIS W CONTRAST Result Date: 07/10/2024 CLINICAL DATA:  Left lower quadrant abdominal pain EXAM: CT ABDOMEN AND PELVIS WITH CONTRAST TECHNIQUE: Multidetector CT imaging of the abdomen and pelvis was performed using the standard protocol following bolus  administration of intravenous contrast. RADIATION DOSE REDUCTION: This exam was performed according to the departmental dose-optimization program which includes automated exposure control, adjustment of the mA and/or kV according to patient size and/or use of iterative reconstruction technique. CONTRAST:  80mL OMNIPAQUE  IOHEXOL  300 MG/ML  SOLN COMPARISON:  CT abdomen and pelvis 05/20/2024. FINDINGS: Lower chest: No acute abnormality. Hepatobiliary: No focal liver abnormality is seen. No gallstones, gallbladder wall thickening, or biliary dilatation. Pancreas: Unremarkable. No pancreatic ductal dilatation or surrounding inflammatory changes. Spleen: Normal in size without focal abnormality. Adrenals/Urinary Tract: There are rounded hypodensities in both kidneys which are too small to characterize, most likely cysts. There is no hydronephrosis or perinephric fat stranding. Adrenal glands and bladder are within normal limits. Stomach/Bowel: Stomach is within normal limits. Appendix is not seen. No evidence of bowel wall thickening, distention, or inflammatory changes. There is diffuse colonic diverticulosis. There is a large amount of stool throughout the colon Vascular/Lymphatic: Aortic atherosclerosis. No enlarged abdominal or pelvic lymph nodes. Reproductive: Uterus and bilateral adnexa are unremarkable. Other: No abdominal wall hernia or abnormality. No abdominopelvic ascites. Musculoskeletal: Scoliosis of the spine appears unchanged. Degenerative changes affect the spine and hips. No acute fracture identified. IMPRESSION: 1. No acute localizing process in the abdomen or pelvis. 2. Colonic diverticulosis without evidence for diverticulitis. 3. Large amount of stool throughout the colon. Aortic Atherosclerosis (ICD10-I70.0). Electronically Signed   By: Greig Pique M.D.   On: 07/10/2024 20:51    Procedures   Medications Ordered in the ED  iohexol  (OMNIPAQUE ) 300 MG/ML solution 80 mL (80 mLs Intravenous  Contrast Given 07/10/24 1852)                               Medical Decision Making Amount and/or Complexity of Data Reviewed Labs: ordered. Radiology: ordered.  Risk Prescription drug management.   This patient is a 89 year old female who presents to the ED for concern of left lower quad abdominal pain  similar to her previous episode of diverticulitis, last seen for similar symptoms in [place], diagnosed with colitis at that time.  Noted to have symptoms starting this afternoon described as dull, achy in her left lower quadrant with no other accompanying symptoms at this time.  On physical exam, patient is in no acute distress, afebrile, alert and orient x 4, speaking in full sentences, nontachypneic, nontachycardic.  Notably does have some mild left lower quadrant tenderness to palpation And, with no other peritoneal signs.  Unremarkable exam otherwise.   Patient does not appear to be ill however with patient saying that she has had similar symptoms in the past with diverticulitis, will CT abdomen as well as get baseline labs.  Baseline labs were unremarkable and CT scan did not show any acute findings, but did show some stool burden.  Will prescribe some MiraLAX  for this and have her follow-up with close follow-up with PCP.  Case discussed with attending who agrees with plan.  Patient vital signs have remained stable throughout the course of patient's time in the ED. Low suspicion for any other emergent pathology at this time. I believe this patient is safe to be discharged. Provided strict return to ER precautions. Patient expressed agreement and understanding of plan. All questions were answered.    Differential diagnoses prior to evaluation: The emergent differential diagnosis includes, but is not limited to, mesenteric ischemia, diverticulitis, nephrolithiasis, constipation, bowel obstruction, IBD,  ovarian torsion, PID  . This is not an exhaustive differential.   Past Medical  History / Co-morbidities / Social History: HTN, HLD, hiatal hernia, CAD, pernicious anemia, Raynaud's disease, diverticulosis, type 2 diabetes, paroxysmal A-fib  Additional history: Chart reviewed. Pertinent results include:   Seen on 05/17/2024 for colitis, with similar symptoms of left lower quadrant pain  Lab Tests/Imaging studies: I personally interpreted labs/imaging and the pertinent results include:    CBC unremarkable CMP shows a mild hyponatremia, mildly elevated alk phos and a mildly decreased GFR 56 UA does note glucose in the urine but otherwise unremarkable Lipase unremarkable.  CT shows stool burden without any acute findings.  I agree with the radiologist interpretation.    Medications: I ordered medication including MiraLAX .  I have reviewed the patients home medicines and have made adjustments as needed.  Critical Interventions: None  Social Determinants of Health: Has good follow-up with PCP  Disposition: After consideration of the diagnostic results and the patients response to treatment, I feel that the patient would benefit from discharge and treatment as above.   emergency department workup does not suggest an emergent condition requiring admission or immediate intervention beyond what has been performed at this time. The plan is: Follow-up with PCP, MiraLAX , return for new or worsening symptoms. The patient is safe for discharge and has been instructed to return immediately for worsening symptoms, change in symptoms or any other concerns.    Final diagnoses:  LLQ abdominal pain    ED Discharge Orders          Ordered    polyethylene glycol (MIRALAX ) 17 g packet  Daily        07/10/24 2111               Kerney Hopfensperger S, PA-C 07/10/24 2114    Patsey Lot, MD 07/10/24 2322

## 2024-07-11 ENCOUNTER — Other Ambulatory Visit (HOSPITAL_BASED_OUTPATIENT_CLINIC_OR_DEPARTMENT_OTHER): Payer: Self-pay

## 2024-07-16 DIAGNOSIS — K5901 Slow transit constipation: Secondary | ICD-10-CM | POA: Diagnosis not present

## 2024-07-16 DIAGNOSIS — R1314 Dysphagia, pharyngoesophageal phase: Secondary | ICD-10-CM | POA: Diagnosis not present

## 2024-07-16 DIAGNOSIS — K219 Gastro-esophageal reflux disease without esophagitis: Secondary | ICD-10-CM | POA: Diagnosis not present

## 2024-07-16 DIAGNOSIS — K5790 Diverticulosis of intestine, part unspecified, without perforation or abscess without bleeding: Secondary | ICD-10-CM | POA: Diagnosis not present

## 2024-07-22 ENCOUNTER — Other Ambulatory Visit (HOSPITAL_BASED_OUTPATIENT_CLINIC_OR_DEPARTMENT_OTHER): Payer: Self-pay

## 2024-09-05 DIAGNOSIS — F339 Major depressive disorder, recurrent, unspecified: Secondary | ICD-10-CM | POA: Diagnosis not present

## 2024-09-05 DIAGNOSIS — M542 Cervicalgia: Secondary | ICD-10-CM | POA: Diagnosis not present

## 2024-09-05 DIAGNOSIS — R5383 Other fatigue: Secondary | ICD-10-CM | POA: Diagnosis not present

## 2024-09-05 DIAGNOSIS — N1832 Chronic kidney disease, stage 3b: Secondary | ICD-10-CM | POA: Diagnosis not present

## 2024-09-05 DIAGNOSIS — I10 Essential (primary) hypertension: Secondary | ICD-10-CM | POA: Diagnosis not present

## 2024-11-26 ENCOUNTER — Other Ambulatory Visit: Payer: Self-pay | Admitting: Cardiology
